# Patient Record
Sex: Female | Born: 1935 | Race: White | Hispanic: No | Smoking: Never smoker
Health system: Southern US, Community
[De-identification: ages and names within clinical notes are randomized; demographics above are authoritative.]

## PROBLEM LIST (undated history)

## (undated) DIAGNOSIS — G4700 Insomnia, unspecified: Secondary | ICD-10-CM

## (undated) DIAGNOSIS — K279 Peptic ulcer, site unspecified, unspecified as acute or chronic, without hemorrhage or perforation: Secondary | ICD-10-CM

## (undated) DIAGNOSIS — I052 Rheumatic mitral stenosis with insufficiency: Secondary | ICD-10-CM

## (undated) DIAGNOSIS — K449 Diaphragmatic hernia without obstruction or gangrene: Secondary | ICD-10-CM

## (undated) DIAGNOSIS — I10 Essential (primary) hypertension: Secondary | ICD-10-CM

## (undated) DIAGNOSIS — K222 Esophageal obstruction: Secondary | ICD-10-CM

## (undated) DIAGNOSIS — F419 Anxiety disorder, unspecified: Secondary | ICD-10-CM

## (undated) DIAGNOSIS — F329 Major depressive disorder, single episode, unspecified: Secondary | ICD-10-CM

## (undated) DIAGNOSIS — I4891 Unspecified atrial fibrillation: Secondary | ICD-10-CM

## (undated) DIAGNOSIS — C50919 Malignant neoplasm of unspecified site of unspecified female breast: Secondary | ICD-10-CM

## (undated) DIAGNOSIS — K311 Adult hypertrophic pyloric stenosis: Secondary | ICD-10-CM

## (undated) DIAGNOSIS — E785 Hyperlipidemia, unspecified: Secondary | ICD-10-CM

## (undated) DIAGNOSIS — I099 Rheumatic heart disease, unspecified: Secondary | ICD-10-CM

## (undated) DIAGNOSIS — J449 Chronic obstructive pulmonary disease, unspecified: Secondary | ICD-10-CM

## (undated) DIAGNOSIS — F32A Depression, unspecified: Secondary | ICD-10-CM

## (undated) DIAGNOSIS — K219 Gastro-esophageal reflux disease without esophagitis: Secondary | ICD-10-CM

## (undated) DIAGNOSIS — K648 Other hemorrhoids: Secondary | ICD-10-CM

## (undated) DIAGNOSIS — M1990 Unspecified osteoarthritis, unspecified site: Secondary | ICD-10-CM

## (undated) DIAGNOSIS — K5790 Diverticulosis of intestine, part unspecified, without perforation or abscess without bleeding: Secondary | ICD-10-CM

## (undated) HISTORY — DX: Chronic obstructive pulmonary disease, unspecified: J44.9

## (undated) HISTORY — DX: Anxiety disorder, unspecified: F41.9

## (undated) HISTORY — DX: Unspecified osteoarthritis, unspecified site: M19.90

## (undated) HISTORY — PX: ABDOMINAL HYSTERECTOMY: SHX81

## (undated) HISTORY — DX: Other hemorrhoids: K64.8

## (undated) HISTORY — DX: Depression, unspecified: F32.A

## (undated) HISTORY — PX: BREAST SURGERY: SHX581

## (undated) HISTORY — DX: Adult hypertrophic pyloric stenosis: K31.1

## (undated) HISTORY — DX: Diverticulosis of intestine, part unspecified, without perforation or abscess without bleeding: K57.90

## (undated) HISTORY — PX: HEMORRHOID SURGERY: SHX153

## (undated) HISTORY — DX: Essential (primary) hypertension: I10

## (undated) HISTORY — DX: Major depressive disorder, single episode, unspecified: F32.9

## (undated) HISTORY — DX: Esophageal obstruction: K22.2

## (undated) HISTORY — DX: Diaphragmatic hernia without obstruction or gangrene: K44.9

## (undated) HISTORY — PX: TOTAL KNEE ARTHROPLASTY: SHX125

## (undated) HISTORY — DX: Gastro-esophageal reflux disease without esophagitis: K21.9

## (undated) HISTORY — PX: JOINT REPLACEMENT: SHX530

## (undated) HISTORY — DX: Malignant neoplasm of unspecified site of unspecified female breast: C50.919

## (undated) HISTORY — DX: Rheumatic mitral stenosis with insufficiency: I05.2

## (undated) HISTORY — DX: Peptic ulcer, site unspecified, unspecified as acute or chronic, without hemorrhage or perforation: K27.9

## (undated) HISTORY — PX: TONSILLECTOMY: SUR1361

## (undated) HISTORY — DX: Rheumatic heart disease, unspecified: I09.9

## (undated) HISTORY — DX: Hyperlipidemia, unspecified: E78.5

## (undated) HISTORY — DX: Insomnia, unspecified: G47.00

## (undated) HISTORY — PX: CATARACT EXTRACTION: SUR2

## (undated) HISTORY — DX: Unspecified atrial fibrillation: I48.91

---

## 1993-04-18 DIAGNOSIS — Z9010 Acquired absence of unspecified breast and nipple: Secondary | ICD-10-CM | POA: Insufficient documentation

## 1993-04-18 HISTORY — PX: MASTECTOMY: SHX3

## 1994-10-06 DIAGNOSIS — I4949 Other premature depolarization: Secondary | ICD-10-CM | POA: Insufficient documentation

## 1998-01-26 ENCOUNTER — Other Ambulatory Visit: Admission: RE | Admit: 1998-01-26 | Discharge: 1998-01-26 | Payer: Self-pay | Admitting: *Deleted

## 1998-04-18 HISTORY — PX: BREAST RECONSTRUCTION: SHX9

## 1998-06-18 ENCOUNTER — Encounter: Payer: Self-pay | Admitting: Internal Medicine

## 1998-06-18 ENCOUNTER — Ambulatory Visit (HOSPITAL_COMMUNITY): Admission: RE | Admit: 1998-06-18 | Discharge: 1998-06-18 | Payer: Self-pay | Admitting: Internal Medicine

## 1998-08-03 ENCOUNTER — Ambulatory Visit (HOSPITAL_COMMUNITY): Admission: RE | Admit: 1998-08-03 | Discharge: 1998-08-03 | Payer: Self-pay | Admitting: Internal Medicine

## 1998-08-03 ENCOUNTER — Encounter: Payer: Self-pay | Admitting: Internal Medicine

## 1998-12-23 ENCOUNTER — Other Ambulatory Visit: Admission: RE | Admit: 1998-12-23 | Discharge: 1998-12-23 | Payer: Self-pay | Admitting: *Deleted

## 1999-04-08 ENCOUNTER — Encounter: Payer: Self-pay | Admitting: Emergency Medicine

## 1999-04-08 ENCOUNTER — Emergency Department (HOSPITAL_COMMUNITY): Admission: EM | Admit: 1999-04-08 | Discharge: 1999-04-08 | Payer: Self-pay | Admitting: Emergency Medicine

## 1999-04-14 ENCOUNTER — Encounter: Payer: Self-pay | Admitting: Emergency Medicine

## 1999-04-14 ENCOUNTER — Emergency Department (HOSPITAL_COMMUNITY): Admission: EM | Admit: 1999-04-14 | Discharge: 1999-04-14 | Payer: Self-pay | Admitting: Emergency Medicine

## 1999-12-28 ENCOUNTER — Encounter: Payer: Self-pay | Admitting: Oncology

## 1999-12-28 ENCOUNTER — Encounter: Admission: RE | Admit: 1999-12-28 | Discharge: 1999-12-28 | Payer: Self-pay | Admitting: Oncology

## 2000-08-08 ENCOUNTER — Encounter (INDEPENDENT_AMBULATORY_CARE_PROVIDER_SITE_OTHER): Payer: Self-pay

## 2000-08-08 ENCOUNTER — Other Ambulatory Visit: Admission: RE | Admit: 2000-08-08 | Discharge: 2000-08-08 | Payer: Self-pay | Admitting: Gastroenterology

## 2000-08-08 DIAGNOSIS — Z8719 Personal history of other diseases of the digestive system: Secondary | ICD-10-CM

## 2001-01-08 ENCOUNTER — Encounter: Admission: RE | Admit: 2001-01-08 | Discharge: 2001-01-08 | Payer: Self-pay | Admitting: Oncology

## 2001-01-08 ENCOUNTER — Encounter: Payer: Self-pay | Admitting: Oncology

## 2001-07-16 ENCOUNTER — Ambulatory Visit (HOSPITAL_COMMUNITY): Admission: RE | Admit: 2001-07-16 | Discharge: 2001-07-16 | Payer: Self-pay | Admitting: Internal Medicine

## 2001-07-16 ENCOUNTER — Encounter: Payer: Self-pay | Admitting: Internal Medicine

## 2002-01-23 ENCOUNTER — Encounter: Admission: RE | Admit: 2002-01-23 | Discharge: 2002-01-23 | Payer: Self-pay | Admitting: Internal Medicine

## 2002-01-23 ENCOUNTER — Encounter: Payer: Self-pay | Admitting: Internal Medicine

## 2003-01-28 ENCOUNTER — Encounter: Admission: RE | Admit: 2003-01-28 | Discharge: 2003-01-28 | Payer: Self-pay | Admitting: Internal Medicine

## 2003-01-28 ENCOUNTER — Encounter: Payer: Self-pay | Admitting: Internal Medicine

## 2003-04-27 ENCOUNTER — Emergency Department (HOSPITAL_COMMUNITY): Admission: AD | Admit: 2003-04-27 | Discharge: 2003-04-27 | Payer: Self-pay | Admitting: Family Medicine

## 2003-08-06 ENCOUNTER — Encounter: Admission: RE | Admit: 2003-08-06 | Discharge: 2003-08-06 | Payer: Self-pay | Admitting: Orthopedic Surgery

## 2003-11-14 ENCOUNTER — Encounter: Admission: RE | Admit: 2003-11-14 | Discharge: 2003-11-14 | Payer: Self-pay | Admitting: Orthopedic Surgery

## 2003-12-19 ENCOUNTER — Encounter: Admission: RE | Admit: 2003-12-19 | Discharge: 2003-12-19 | Payer: Self-pay | Admitting: Orthopedic Surgery

## 2004-02-02 ENCOUNTER — Encounter: Admission: RE | Admit: 2004-02-02 | Discharge: 2004-02-02 | Payer: Self-pay | Admitting: Family Medicine

## 2004-03-01 ENCOUNTER — Ambulatory Visit: Payer: Self-pay | Admitting: Family Medicine

## 2004-03-22 ENCOUNTER — Ambulatory Visit: Payer: Self-pay | Admitting: Family Medicine

## 2004-04-20 ENCOUNTER — Emergency Department (HOSPITAL_COMMUNITY): Admission: EM | Admit: 2004-04-20 | Discharge: 2004-04-20 | Payer: Self-pay | Admitting: Emergency Medicine

## 2004-05-17 ENCOUNTER — Ambulatory Visit: Payer: Self-pay | Admitting: Family Medicine

## 2004-08-16 ENCOUNTER — Ambulatory Visit: Payer: Self-pay | Admitting: Family Medicine

## 2004-09-06 ENCOUNTER — Ambulatory Visit: Payer: Self-pay | Admitting: Family Medicine

## 2004-09-10 ENCOUNTER — Ambulatory Visit: Payer: Self-pay | Admitting: Family Medicine

## 2004-10-06 ENCOUNTER — Ambulatory Visit: Payer: Self-pay | Admitting: Gastroenterology

## 2004-10-06 ENCOUNTER — Inpatient Hospital Stay (HOSPITAL_COMMUNITY): Admission: EM | Admit: 2004-10-06 | Discharge: 2004-10-08 | Payer: Self-pay | Admitting: Emergency Medicine

## 2004-10-06 ENCOUNTER — Ambulatory Visit: Payer: Self-pay | Admitting: Internal Medicine

## 2004-10-08 ENCOUNTER — Encounter (INDEPENDENT_AMBULATORY_CARE_PROVIDER_SITE_OTHER): Payer: Self-pay | Admitting: *Deleted

## 2004-10-14 ENCOUNTER — Ambulatory Visit: Payer: Self-pay | Admitting: Family Medicine

## 2004-10-15 ENCOUNTER — Ambulatory Visit (HOSPITAL_COMMUNITY): Admission: RE | Admit: 2004-10-15 | Discharge: 2004-10-15 | Payer: Self-pay | Admitting: Internal Medicine

## 2004-10-15 ENCOUNTER — Encounter (INDEPENDENT_AMBULATORY_CARE_PROVIDER_SITE_OTHER): Payer: Self-pay | Admitting: Specialist

## 2004-10-18 ENCOUNTER — Ambulatory Visit: Payer: Self-pay | Admitting: Family Medicine

## 2004-11-03 ENCOUNTER — Ambulatory Visit: Payer: Self-pay | Admitting: Family Medicine

## 2004-12-02 ENCOUNTER — Ambulatory Visit: Payer: Self-pay | Admitting: Family Medicine

## 2005-02-16 ENCOUNTER — Encounter: Admission: RE | Admit: 2005-02-16 | Discharge: 2005-02-16 | Payer: Self-pay | Admitting: Family Medicine

## 2005-03-15 ENCOUNTER — Ambulatory Visit: Payer: Self-pay | Admitting: Family Medicine

## 2005-04-05 ENCOUNTER — Ambulatory Visit: Payer: Self-pay | Admitting: Family Medicine

## 2005-05-11 ENCOUNTER — Ambulatory Visit: Payer: Self-pay | Admitting: Family Medicine

## 2005-05-16 ENCOUNTER — Encounter: Admission: RE | Admit: 2005-05-16 | Discharge: 2005-05-16 | Payer: Self-pay | Admitting: Family Medicine

## 2005-05-19 ENCOUNTER — Encounter: Admission: RE | Admit: 2005-05-19 | Discharge: 2005-05-19 | Payer: Self-pay | Admitting: Family Medicine

## 2005-05-24 ENCOUNTER — Encounter: Admission: RE | Admit: 2005-05-24 | Discharge: 2005-05-24 | Payer: Self-pay | Admitting: Family Medicine

## 2005-06-03 ENCOUNTER — Ambulatory Visit: Payer: Self-pay | Admitting: Family Medicine

## 2005-06-10 ENCOUNTER — Ambulatory Visit: Payer: Self-pay | Admitting: Family Medicine

## 2005-06-17 ENCOUNTER — Ambulatory Visit: Payer: Self-pay | Admitting: Family Medicine

## 2005-06-24 ENCOUNTER — Ambulatory Visit: Payer: Self-pay | Admitting: Family Medicine

## 2005-07-01 ENCOUNTER — Ambulatory Visit: Payer: Self-pay | Admitting: Family Medicine

## 2005-07-18 ENCOUNTER — Ambulatory Visit: Payer: Self-pay | Admitting: Family Medicine

## 2005-07-21 ENCOUNTER — Ambulatory Visit: Payer: Self-pay | Admitting: Family Medicine

## 2005-07-27 ENCOUNTER — Ambulatory Visit: Payer: Self-pay | Admitting: Family Medicine

## 2005-08-02 ENCOUNTER — Ambulatory Visit: Payer: Self-pay | Admitting: Internal Medicine

## 2005-08-20 ENCOUNTER — Ambulatory Visit: Payer: Self-pay | Admitting: Internal Medicine

## 2005-12-26 ENCOUNTER — Ambulatory Visit: Payer: Self-pay | Admitting: Family Medicine

## 2005-12-27 ENCOUNTER — Ambulatory Visit: Payer: Self-pay | Admitting: Family Medicine

## 2006-02-15 ENCOUNTER — Ambulatory Visit: Payer: Self-pay | Admitting: Family Medicine

## 2006-02-28 ENCOUNTER — Ambulatory Visit: Payer: Self-pay | Admitting: Family Medicine

## 2006-03-07 ENCOUNTER — Encounter: Admission: RE | Admit: 2006-03-07 | Discharge: 2006-03-07 | Payer: Self-pay | Admitting: Family Medicine

## 2006-03-31 ENCOUNTER — Ambulatory Visit: Payer: Self-pay | Admitting: Family Medicine

## 2006-03-31 LAB — CONVERTED CEMR LAB
ALT: 17 units/L (ref 0–40)
AST: 19 units/L (ref 0–37)
Bilirubin, Direct: 0.1 mg/dL (ref 0.0–0.3)
Cholesterol: 142 mg/dL (ref 0–200)
HDL: 46 mg/dL (ref >39.0)
Total Bilirubin: 0.8 mg/dL (ref 0.3–1.2)
Total Protein: 7 g/dL (ref 6.0–8.3)
Triglyceride fasting, serum: 86 mg/dL (ref 0–149)
VLDL: 17 mg/dL (ref 0–40)

## 2006-04-07 ENCOUNTER — Ambulatory Visit: Payer: Self-pay | Admitting: Family Medicine

## 2006-04-27 ENCOUNTER — Ambulatory Visit: Payer: Self-pay

## 2006-05-11 ENCOUNTER — Ambulatory Visit: Payer: Self-pay | Admitting: Family Medicine

## 2006-05-19 ENCOUNTER — Ambulatory Visit: Payer: Self-pay

## 2006-05-19 ENCOUNTER — Encounter: Payer: Self-pay | Admitting: Cardiology

## 2006-06-06 ENCOUNTER — Ambulatory Visit: Payer: Self-pay | Admitting: Internal Medicine

## 2006-06-12 ENCOUNTER — Ambulatory Visit: Payer: Self-pay | Admitting: Internal Medicine

## 2006-06-12 ENCOUNTER — Ambulatory Visit: Admission: RE | Admit: 2006-06-12 | Discharge: 2006-06-12 | Payer: Self-pay | Admitting: Internal Medicine

## 2006-06-12 LAB — CONVERTED CEMR LAB
BUN: 15 mg/dL (ref 6–23)
Basophils Absolute: 0.1 10*3/uL (ref 0.0–0.1)
Chloride: 105 meq/L (ref 96–112)
Eosinophils Absolute: 0.2 10*3/uL (ref 0.0–0.6)
GFR calc non Af Amer: 87 mL/min
HCT: 40.6 % (ref 36.0–46.0)
MCHC: 34.6 g/dL (ref 30.0–36.0)
MCV: 96.1 fL (ref 78.0–100.0)
Monocytes Relative: 7 % (ref 3.0–11.0)
Neutrophils Relative %: 63.4 % (ref 43.0–77.0)
Potassium: 3.6 meq/L (ref 3.5–5.1)
RBC: 4.23 M/uL (ref 3.87–5.11)
Sodium: 144 meq/L (ref 135–145)
aPTT: 27.9 s (ref 26.5–36.5)

## 2006-06-13 ENCOUNTER — Ambulatory Visit: Payer: Self-pay | Admitting: Cardiology

## 2006-06-15 ENCOUNTER — Inpatient Hospital Stay (HOSPITAL_BASED_OUTPATIENT_CLINIC_OR_DEPARTMENT_OTHER): Admission: RE | Admit: 2006-06-15 | Discharge: 2006-06-15 | Payer: Self-pay | Admitting: Internal Medicine

## 2006-06-15 ENCOUNTER — Ambulatory Visit: Payer: Self-pay | Admitting: Internal Medicine

## 2006-06-21 ENCOUNTER — Ambulatory Visit (HOSPITAL_COMMUNITY): Admission: RE | Admit: 2006-06-21 | Discharge: 2006-06-21 | Payer: Self-pay | Admitting: Internal Medicine

## 2006-06-22 ENCOUNTER — Ambulatory Visit (HOSPITAL_COMMUNITY): Admission: RE | Admit: 2006-06-22 | Discharge: 2006-06-22 | Payer: Self-pay | Admitting: Internal Medicine

## 2006-07-04 ENCOUNTER — Ambulatory Visit: Payer: Self-pay | Admitting: Internal Medicine

## 2006-07-27 ENCOUNTER — Ambulatory Visit: Payer: Self-pay | Admitting: Internal Medicine

## 2006-07-27 ENCOUNTER — Ambulatory Visit (HOSPITAL_COMMUNITY): Admission: RE | Admit: 2006-07-27 | Discharge: 2006-07-27 | Payer: Self-pay | Admitting: Internal Medicine

## 2006-08-03 ENCOUNTER — Ambulatory Visit: Payer: Self-pay | Admitting: Family Medicine

## 2006-08-03 LAB — CONVERTED CEMR LAB
ALT: 13 units/L (ref 0–40)
AST: 19 units/L (ref 0–37)
Albumin: 3.9 g/dL (ref 3.5–5.2)
Alkaline Phosphatase: 62 units/L (ref 39–117)
BUN: 14 mg/dL (ref 6–23)
Chloride: 107 meq/L (ref 96–112)
Creatinine, Ser: 0.7 mg/dL (ref 0.4–1.2)
HDL: 39.4 mg/dL (ref >39.0)
Total Bilirubin: 0.8 mg/dL (ref 0.3–1.2)
VLDL: 31 mg/dL (ref 0–40)

## 2006-08-22 ENCOUNTER — Encounter: Admission: RE | Admit: 2006-08-22 | Discharge: 2006-08-22 | Payer: Self-pay | Admitting: Family Medicine

## 2006-08-22 ENCOUNTER — Ambulatory Visit: Payer: Self-pay | Admitting: Family Medicine

## 2006-08-29 ENCOUNTER — Ambulatory Visit: Payer: Self-pay | Admitting: Internal Medicine

## 2006-08-29 ENCOUNTER — Ambulatory Visit: Payer: Self-pay

## 2006-08-31 ENCOUNTER — Encounter: Admission: RE | Admit: 2006-08-31 | Discharge: 2006-08-31 | Payer: Self-pay | Admitting: Family Medicine

## 2006-09-04 DIAGNOSIS — I10 Essential (primary) hypertension: Secondary | ICD-10-CM | POA: Insufficient documentation

## 2006-09-04 DIAGNOSIS — Z8679 Personal history of other diseases of the circulatory system: Secondary | ICD-10-CM

## 2006-09-04 DIAGNOSIS — Z9089 Acquired absence of other organs: Secondary | ICD-10-CM

## 2006-09-04 DIAGNOSIS — H409 Unspecified glaucoma: Secondary | ICD-10-CM | POA: Insufficient documentation

## 2006-09-04 DIAGNOSIS — Z9079 Acquired absence of other genital organ(s): Secondary | ICD-10-CM | POA: Insufficient documentation

## 2006-09-04 DIAGNOSIS — M810 Age-related osteoporosis without current pathological fracture: Secondary | ICD-10-CM

## 2006-10-24 ENCOUNTER — Encounter: Payer: Self-pay | Admitting: Family Medicine

## 2006-11-22 ENCOUNTER — Encounter: Payer: Self-pay | Admitting: *Deleted

## 2006-11-30 ENCOUNTER — Ambulatory Visit (HOSPITAL_COMMUNITY): Admission: RE | Admit: 2006-11-30 | Discharge: 2006-11-30 | Payer: Self-pay | Admitting: Interventional Radiology

## 2006-12-19 ENCOUNTER — Ambulatory Visit: Payer: Self-pay | Admitting: Family Medicine

## 2006-12-19 DIAGNOSIS — F411 Generalized anxiety disorder: Secondary | ICD-10-CM | POA: Insufficient documentation

## 2006-12-19 DIAGNOSIS — J4489 Other specified chronic obstructive pulmonary disease: Secondary | ICD-10-CM | POA: Insufficient documentation

## 2006-12-19 DIAGNOSIS — F329 Major depressive disorder, single episode, unspecified: Secondary | ICD-10-CM | POA: Insufficient documentation

## 2006-12-19 DIAGNOSIS — E785 Hyperlipidemia, unspecified: Secondary | ICD-10-CM

## 2006-12-19 DIAGNOSIS — F419 Anxiety disorder, unspecified: Secondary | ICD-10-CM

## 2006-12-19 DIAGNOSIS — J449 Chronic obstructive pulmonary disease, unspecified: Secondary | ICD-10-CM

## 2006-12-20 ENCOUNTER — Ambulatory Visit: Payer: Self-pay | Admitting: Family Medicine

## 2006-12-21 ENCOUNTER — Encounter: Payer: Self-pay | Admitting: Family Medicine

## 2006-12-25 LAB — CONVERTED CEMR LAB
ALT: 16 units/L (ref 0–35)
AST: 14 units/L (ref 0–37)
Albumin: 4.1 g/dL (ref 3.5–5.2)
Calcium: 9.6 mg/dL (ref 8.4–10.5)
Chloride: 107 meq/L (ref 96–112)
GFR calc non Af Amer: 65 mL/min
LDL Cholesterol: 85 mg/dL (ref 0–99)
Sodium: 145 meq/L (ref 135–145)
VLDL: 23 mg/dL (ref 0–40)

## 2007-01-02 ENCOUNTER — Inpatient Hospital Stay (HOSPITAL_COMMUNITY): Admission: RE | Admit: 2007-01-02 | Discharge: 2007-01-06 | Payer: Self-pay | Admitting: Orthopedic Surgery

## 2007-02-12 ENCOUNTER — Telehealth: Payer: Self-pay | Admitting: Internal Medicine

## 2007-04-02 ENCOUNTER — Ambulatory Visit: Payer: Self-pay | Admitting: Family Medicine

## 2007-04-03 ENCOUNTER — Encounter: Admission: RE | Admit: 2007-04-03 | Discharge: 2007-04-03 | Payer: Self-pay | Admitting: Family Medicine

## 2007-04-04 ENCOUNTER — Telehealth (INDEPENDENT_AMBULATORY_CARE_PROVIDER_SITE_OTHER): Payer: Self-pay | Admitting: *Deleted

## 2007-04-09 ENCOUNTER — Encounter (INDEPENDENT_AMBULATORY_CARE_PROVIDER_SITE_OTHER): Payer: Self-pay | Admitting: *Deleted

## 2007-04-17 ENCOUNTER — Ambulatory Visit: Payer: Self-pay | Admitting: Internal Medicine

## 2007-04-17 ENCOUNTER — Telehealth (INDEPENDENT_AMBULATORY_CARE_PROVIDER_SITE_OTHER): Payer: Self-pay | Admitting: *Deleted

## 2007-04-17 LAB — CONVERTED CEMR LAB
Eosinophils Absolute: 0.2 10*3/uL (ref 0.0–0.6)
Eosinophils Relative: 3.1 % (ref 0.0–5.0)
Lymphocytes Relative: 25.2 % (ref 12.0–46.0)
MCV: 93.9 fL (ref 78.0–100.0)
Monocytes Absolute: 0.3 10*3/uL (ref 0.2–0.7)
Neutro Abs: 3.9 10*3/uL (ref 1.4–7.7)
Platelets: 275 10*3/uL (ref 150–400)
WBC: 5.9 10*3/uL (ref 4.5–10.5)

## 2007-04-20 ENCOUNTER — Ambulatory Visit: Payer: Self-pay | Admitting: Internal Medicine

## 2007-04-27 ENCOUNTER — Telehealth (INDEPENDENT_AMBULATORY_CARE_PROVIDER_SITE_OTHER): Payer: Self-pay | Admitting: *Deleted

## 2007-05-02 ENCOUNTER — Ambulatory Visit: Payer: Self-pay | Admitting: Internal Medicine

## 2007-05-29 ENCOUNTER — Encounter: Payer: Self-pay | Admitting: Family Medicine

## 2007-05-29 ENCOUNTER — Ambulatory Visit: Payer: Self-pay | Admitting: Internal Medicine

## 2007-05-29 ENCOUNTER — Encounter: Payer: Self-pay | Admitting: Internal Medicine

## 2007-06-04 ENCOUNTER — Telehealth (INDEPENDENT_AMBULATORY_CARE_PROVIDER_SITE_OTHER): Payer: Self-pay | Admitting: *Deleted

## 2007-06-06 ENCOUNTER — Encounter: Payer: Self-pay | Admitting: Family Medicine

## 2007-06-07 DIAGNOSIS — K279 Peptic ulcer, site unspecified, unspecified as acute or chronic, without hemorrhage or perforation: Secondary | ICD-10-CM | POA: Insufficient documentation

## 2007-06-08 ENCOUNTER — Encounter: Payer: Self-pay | Admitting: Family Medicine

## 2007-06-20 ENCOUNTER — Ambulatory Visit: Payer: Self-pay | Admitting: Family Medicine

## 2007-06-21 LAB — CONVERTED CEMR LAB
ALT: 11 units/L (ref 0–35)
AST: 14 units/L (ref 0–37)
Albumin: 4 g/dL (ref 3.5–5.2)
Alkaline Phosphatase: 60 units/L (ref 39–117)
Bilirubin, Direct: 0.1 mg/dL (ref 0.0–0.3)
HDL: 51.2 mg/dL (ref >39.0)
VLDL: 26 mg/dL (ref 0–40)

## 2007-06-28 ENCOUNTER — Ambulatory Visit: Payer: Self-pay | Admitting: Family Medicine

## 2007-06-28 DIAGNOSIS — R42 Dizziness and giddiness: Secondary | ICD-10-CM | POA: Insufficient documentation

## 2007-07-02 ENCOUNTER — Encounter (INDEPENDENT_AMBULATORY_CARE_PROVIDER_SITE_OTHER): Payer: Self-pay | Admitting: *Deleted

## 2007-07-02 LAB — CONVERTED CEMR LAB
Albumin: 4.4 g/dL (ref 3.5–5.2)
Bilirubin, Direct: 0.2 mg/dL (ref 0.0–0.3)
Eosinophils Absolute: 0.1 10*3/uL (ref 0.0–0.6)
Folate: 6.6 ng/mL
GFR calc non Af Amer: 75 mL/min
Glucose, Bld: 77 mg/dL (ref 70–99)
Lymphocytes Relative: 30 % (ref 12.0–46.0)
MCHC: 33.2 g/dL (ref 30.0–36.0)
MCV: 97.8 fL (ref 78.0–100.0)
Monocytes Relative: 3.7 % (ref 3.0–11.0)
Neutro Abs: 3.8 10*3/uL (ref 1.4–7.7)
Phosphorus: 3.6 mg/dL (ref 2.3–4.6)
Platelets: 258 10*3/uL (ref 150–400)
Potassium: 3.9 meq/L (ref 3.5–5.1)
Sodium: 140 meq/L (ref 135–145)
TSH: 1 microintl units/mL (ref 0.35–5.50)
Total Bilirubin: 0.8 mg/dL (ref 0.3–1.2)
Vitamin B-12: 891 pg/mL (ref 211–911)

## 2007-07-06 ENCOUNTER — Encounter: Payer: Self-pay | Admitting: Family Medicine

## 2007-07-06 ENCOUNTER — Ambulatory Visit: Payer: Self-pay | Admitting: Family Medicine

## 2007-07-10 ENCOUNTER — Telehealth (INDEPENDENT_AMBULATORY_CARE_PROVIDER_SITE_OTHER): Payer: Self-pay | Admitting: *Deleted

## 2007-07-20 ENCOUNTER — Telehealth (INDEPENDENT_AMBULATORY_CARE_PROVIDER_SITE_OTHER): Payer: Self-pay | Admitting: *Deleted

## 2007-08-06 ENCOUNTER — Telehealth (INDEPENDENT_AMBULATORY_CARE_PROVIDER_SITE_OTHER): Payer: Self-pay | Admitting: *Deleted

## 2007-08-07 ENCOUNTER — Ambulatory Visit: Payer: Self-pay | Admitting: Family Medicine

## 2007-08-07 ENCOUNTER — Telehealth (INDEPENDENT_AMBULATORY_CARE_PROVIDER_SITE_OTHER): Payer: Self-pay | Admitting: *Deleted

## 2007-08-07 DIAGNOSIS — R05 Cough: Secondary | ICD-10-CM

## 2007-08-07 DIAGNOSIS — J209 Acute bronchitis, unspecified: Secondary | ICD-10-CM

## 2007-08-09 ENCOUNTER — Ambulatory Visit: Payer: Self-pay | Admitting: Family Medicine

## 2007-10-23 ENCOUNTER — Inpatient Hospital Stay (HOSPITAL_COMMUNITY): Admission: RE | Admit: 2007-10-23 | Discharge: 2007-10-26 | Payer: Self-pay | Admitting: Orthopedic Surgery

## 2007-11-08 ENCOUNTER — Telehealth (INDEPENDENT_AMBULATORY_CARE_PROVIDER_SITE_OTHER): Payer: Self-pay | Admitting: *Deleted

## 2007-12-12 ENCOUNTER — Ambulatory Visit: Payer: Self-pay | Admitting: Family Medicine

## 2007-12-14 ENCOUNTER — Ambulatory Visit: Payer: Self-pay | Admitting: Family Medicine

## 2007-12-26 LAB — CONVERTED CEMR LAB
ALT: 10 units/L (ref 0–35)
AST: 19 units/L (ref 0–37)
Bilirubin, Direct: 0.1 mg/dL (ref 0.0–0.3)
CO2: 26 meq/L (ref 19–32)
Calcium: 9.2 mg/dL (ref 8.4–10.5)
Chloride: 112 meq/L (ref 96–112)
Glucose, Bld: 90 mg/dL (ref 70–99)
HDL: 43.3 mg/dL (ref >39.0)
Sodium: 144 meq/L (ref 135–145)
Total Bilirubin: 0.6 mg/dL (ref 0.3–1.2)
Total Protein: 7 g/dL (ref 6.0–8.3)
Triglycerides: 88 mg/dL (ref 0–149)

## 2007-12-27 ENCOUNTER — Encounter (INDEPENDENT_AMBULATORY_CARE_PROVIDER_SITE_OTHER): Payer: Self-pay | Admitting: *Deleted

## 2008-02-06 ENCOUNTER — Ambulatory Visit: Payer: Self-pay | Admitting: Family Medicine

## 2008-02-06 ENCOUNTER — Encounter (INDEPENDENT_AMBULATORY_CARE_PROVIDER_SITE_OTHER): Payer: Self-pay | Admitting: *Deleted

## 2008-02-06 DIAGNOSIS — R5383 Other fatigue: Secondary | ICD-10-CM

## 2008-02-06 DIAGNOSIS — H9190 Unspecified hearing loss, unspecified ear: Secondary | ICD-10-CM | POA: Insufficient documentation

## 2008-02-06 DIAGNOSIS — R5381 Other malaise: Secondary | ICD-10-CM

## 2008-02-12 LAB — CONVERTED CEMR LAB
Basophils Absolute: 0 10*3/uL (ref 0.0–0.1)
Basophils Relative: 0.9 % (ref 0.0–3.0)
Eosinophils Absolute: 0.1 10*3/uL (ref 0.0–0.7)
Eosinophils Relative: 2.4 % (ref 0.0–5.0)
Ferritin: 19.3 ng/mL (ref 10.0–291.0)
Folate: 7.5 ng/mL
HCT: 42.7 % (ref 36.0–46.0)
Hemoglobin: 14.5 g/dL (ref 12.0–15.0)
Iron: 89 ug/dL (ref 42–145)
Lymphocytes Relative: 32 % (ref 12.0–46.0)
MCHC: 34.1 g/dL (ref 30.0–36.0)
MCV: 91.5 fL (ref 78.0–100.0)
Monocytes Absolute: 0.3 10*3/uL (ref 0.1–1.0)
Monocytes Relative: 5.9 % (ref 3.0–12.0)
Neutro Abs: 2.9 10*3/uL (ref 1.4–7.7)
Neutrophils Relative %: 58.8 % (ref 43.0–77.0)
Platelets: 251 10*3/uL (ref 150–400)
RBC: 4.66 M/uL (ref 3.87–5.11)
RDW: 13.1 % (ref 11.5–14.6)
Saturation Ratios: 19.3 % — ABNORMAL LOW (ref 20.0–50.0)
TSH: 0.87 microintl units/mL (ref 0.35–5.50)
Transferrin: 330.1 mg/dL (ref >=212.0)
Vitamin B-12: 1088 pg/mL — ABNORMAL HIGH (ref 211–911)
WBC: 4.8 10*3/uL (ref 4.5–10.5)

## 2008-02-13 ENCOUNTER — Encounter (INDEPENDENT_AMBULATORY_CARE_PROVIDER_SITE_OTHER): Payer: Self-pay | Admitting: *Deleted

## 2008-02-14 ENCOUNTER — Encounter: Payer: Self-pay | Admitting: Family Medicine

## 2008-04-03 ENCOUNTER — Encounter: Admission: RE | Admit: 2008-04-03 | Discharge: 2008-04-03 | Payer: Self-pay | Admitting: Family Medicine

## 2008-04-07 ENCOUNTER — Encounter (INDEPENDENT_AMBULATORY_CARE_PROVIDER_SITE_OTHER): Payer: Self-pay | Admitting: *Deleted

## 2008-04-15 ENCOUNTER — Telehealth (INDEPENDENT_AMBULATORY_CARE_PROVIDER_SITE_OTHER): Payer: Self-pay | Admitting: *Deleted

## 2008-05-09 ENCOUNTER — Ambulatory Visit: Payer: Self-pay | Admitting: Family Medicine

## 2008-05-09 ENCOUNTER — Telehealth (INDEPENDENT_AMBULATORY_CARE_PROVIDER_SITE_OTHER): Payer: Self-pay | Admitting: *Deleted

## 2008-05-13 ENCOUNTER — Emergency Department (HOSPITAL_COMMUNITY): Admission: EM | Admit: 2008-05-13 | Discharge: 2008-05-13 | Payer: Self-pay | Admitting: Emergency Medicine

## 2008-05-20 ENCOUNTER — Telehealth (INDEPENDENT_AMBULATORY_CARE_PROVIDER_SITE_OTHER): Payer: Self-pay | Admitting: *Deleted

## 2008-06-09 ENCOUNTER — Ambulatory Visit: Payer: Self-pay | Admitting: Family Medicine

## 2008-06-09 DIAGNOSIS — Z780 Asymptomatic menopausal state: Secondary | ICD-10-CM | POA: Insufficient documentation

## 2008-06-09 DIAGNOSIS — J019 Acute sinusitis, unspecified: Secondary | ICD-10-CM

## 2008-07-07 ENCOUNTER — Telehealth: Payer: Self-pay | Admitting: Family Medicine

## 2008-08-11 ENCOUNTER — Telehealth (INDEPENDENT_AMBULATORY_CARE_PROVIDER_SITE_OTHER): Payer: Self-pay | Admitting: *Deleted

## 2008-08-25 ENCOUNTER — Telehealth (INDEPENDENT_AMBULATORY_CARE_PROVIDER_SITE_OTHER): Payer: Self-pay | Admitting: *Deleted

## 2008-10-07 ENCOUNTER — Ambulatory Visit: Payer: Self-pay | Admitting: Family Medicine

## 2008-10-29 ENCOUNTER — Encounter (INDEPENDENT_AMBULATORY_CARE_PROVIDER_SITE_OTHER): Payer: Self-pay | Admitting: *Deleted

## 2008-10-29 LAB — CONVERTED CEMR LAB
Basophils Absolute: 0 10*3/uL (ref 0.0–0.1)
HCT: 39.7 % (ref 36.0–46.0)
Hemoglobin: 13.6 g/dL (ref 12.0–15.0)
Lymphs Abs: 1.4 10*3/uL (ref 0.7–4.0)
MCHC: 34.2 g/dL (ref 30.0–36.0)
MCV: 97.5 fL (ref 78.0–100.0)
Monocytes Absolute: 0.1 10*3/uL (ref 0.1–1.0)
Monocytes Relative: 2 % — ABNORMAL LOW (ref 3.0–12.0)
Neutro Abs: 4.8 10*3/uL (ref 1.4–7.7)
Platelets: 225 10*3/uL (ref 150.0–400.0)
RDW: 12.2 % (ref 11.5–14.6)
Sed Rate: 11 mm/hr (ref 0–22)

## 2008-12-11 ENCOUNTER — Ambulatory Visit: Payer: Self-pay | Admitting: Family Medicine

## 2008-12-11 DIAGNOSIS — M546 Pain in thoracic spine: Secondary | ICD-10-CM | POA: Insufficient documentation

## 2008-12-12 ENCOUNTER — Ambulatory Visit: Payer: Self-pay | Admitting: Family Medicine

## 2008-12-12 LAB — CONVERTED CEMR LAB
Bilirubin Urine: NEGATIVE
Ketones, urine, test strip: NEGATIVE
Nitrite: NEGATIVE
Protein, U semiquant: NEGATIVE
Urobilinogen, UA: 0.2

## 2008-12-23 ENCOUNTER — Encounter (INDEPENDENT_AMBULATORY_CARE_PROVIDER_SITE_OTHER): Payer: Self-pay | Admitting: *Deleted

## 2008-12-23 LAB — CONVERTED CEMR LAB
ALT: 12 units/L (ref 0–35)
AST: 21 units/L (ref 0–37)
Alkaline Phosphatase: 76 units/L (ref 39–117)
Basophils Absolute: 0 10*3/uL (ref 0.0–0.1)
Bilirubin, Direct: 0 mg/dL (ref 0.0–0.3)
CO2: 27 meq/L (ref 19–32)
Chloride: 110 meq/L (ref 96–112)
LDL Cholesterol: 90 mg/dL (ref 0–99)
Lymphocytes Relative: 26.1 % (ref 12.0–46.0)
Monocytes Relative: 5.1 % (ref 3.0–12.0)
Neutro Abs: 2.7 10*3/uL (ref 1.4–7.7)
Neutrophils Relative %: 65.5 % (ref 43.0–77.0)
Platelets: 221 10*3/uL (ref 150.0–400.0)
Potassium: 3.8 meq/L (ref 3.5–5.1)
RBC: 4.2 M/uL (ref 3.87–5.11)
RDW: 11.9 % (ref 11.5–14.6)
Sodium: 143 meq/L (ref 135–145)
Total Bilirubin: 0.6 mg/dL (ref 0.3–1.2)
Total CHOL/HDL Ratio: 3
Total Protein: 6.9 g/dL (ref 6.0–8.3)
WBC: 4 10*3/uL — ABNORMAL LOW (ref 4.5–10.5)

## 2008-12-24 ENCOUNTER — Ambulatory Visit: Payer: Self-pay | Admitting: Family Medicine

## 2008-12-24 ENCOUNTER — Encounter (INDEPENDENT_AMBULATORY_CARE_PROVIDER_SITE_OTHER): Payer: Self-pay | Admitting: *Deleted

## 2008-12-24 LAB — CONVERTED CEMR LAB
OCCULT 1: NEGATIVE
OCCULT 3: NEGATIVE

## 2009-01-07 ENCOUNTER — Encounter: Payer: Self-pay | Admitting: Family Medicine

## 2009-01-15 ENCOUNTER — Ambulatory Visit: Payer: Self-pay | Admitting: Family Medicine

## 2009-01-15 DIAGNOSIS — N949 Unspecified condition associated with female genital organs and menstrual cycle: Secondary | ICD-10-CM | POA: Insufficient documentation

## 2009-01-15 DIAGNOSIS — R300 Dysuria: Secondary | ICD-10-CM

## 2009-01-15 LAB — CONVERTED CEMR LAB
Glucose, Urine, Semiquant: NEGATIVE
Nitrite: NEGATIVE
Specific Gravity, Urine: 1.015
WBC Urine, dipstick: NEGATIVE
pH: 6

## 2009-01-17 ENCOUNTER — Encounter: Payer: Self-pay | Admitting: Family Medicine

## 2009-01-19 ENCOUNTER — Encounter: Payer: Self-pay | Admitting: Family Medicine

## 2009-02-05 ENCOUNTER — Telehealth: Payer: Self-pay | Admitting: Family Medicine

## 2009-02-10 ENCOUNTER — Encounter: Payer: Self-pay | Admitting: Family Medicine

## 2009-03-16 ENCOUNTER — Telehealth (INDEPENDENT_AMBULATORY_CARE_PROVIDER_SITE_OTHER): Payer: Self-pay | Admitting: *Deleted

## 2009-03-19 ENCOUNTER — Ambulatory Visit: Payer: Self-pay | Admitting: Family Medicine

## 2009-03-19 DIAGNOSIS — R51 Headache: Secondary | ICD-10-CM

## 2009-03-19 DIAGNOSIS — R519 Headache, unspecified: Secondary | ICD-10-CM | POA: Insufficient documentation

## 2009-03-24 ENCOUNTER — Ambulatory Visit: Payer: Self-pay | Admitting: Cardiovascular Disease

## 2009-03-25 ENCOUNTER — Telehealth (INDEPENDENT_AMBULATORY_CARE_PROVIDER_SITE_OTHER): Payer: Self-pay | Admitting: *Deleted

## 2009-04-24 ENCOUNTER — Encounter: Admission: RE | Admit: 2009-04-24 | Discharge: 2009-04-24 | Payer: Self-pay | Admitting: Family Medicine

## 2009-07-29 ENCOUNTER — Ambulatory Visit: Payer: Self-pay | Admitting: Family Medicine

## 2009-07-30 ENCOUNTER — Ambulatory Visit: Payer: Self-pay | Admitting: Family Medicine

## 2009-07-31 ENCOUNTER — Encounter: Payer: Self-pay | Admitting: Family Medicine

## 2009-09-25 ENCOUNTER — Ambulatory Visit: Payer: Self-pay | Admitting: Family Medicine

## 2009-09-25 DIAGNOSIS — R0602 Shortness of breath: Secondary | ICD-10-CM

## 2009-09-25 DIAGNOSIS — R079 Chest pain, unspecified: Secondary | ICD-10-CM | POA: Insufficient documentation

## 2009-09-25 LAB — CONVERTED CEMR LAB
Blood in Urine, dipstick: NEGATIVE
Nitrite: NEGATIVE
Specific Gravity, Urine: 1.025
Urobilinogen, UA: NEGATIVE
WBC Urine, dipstick: NEGATIVE

## 2009-09-28 LAB — CONVERTED CEMR LAB
ALT: 10 units/L (ref 0–35)
Albumin: 4.4 g/dL (ref 3.5–5.2)
BUN: 19 mg/dL (ref 6–23)
Basophils Absolute: 0 10*3/uL (ref 0.0–0.1)
Basophils Relative: 0 % (ref 0–1)
CO2: 22 meq/L (ref 19–32)
Calcium: 8.7 mg/dL (ref 8.4–10.5)
Chloride: 107 meq/L (ref 96–112)
Creatinine, Ser: 0.91 mg/dL (ref 0.40–1.20)
Eosinophils Absolute: 0.1 10*3/uL (ref 0.0–0.7)
Eosinophils Relative: 2 % (ref 0–5)
Folate: >20 ng/mL
Glucose, Bld: 93 mg/dL (ref 70–99)
Hemoglobin: 13.2 g/dL (ref 12.0–15.0)
Indirect Bilirubin: 0.3 mg/dL (ref 0.0–0.9)
MCHC: 32 g/dL (ref 30.0–36.0)
MCV: 99 fL (ref 78.0–100.0)
Monocytes Absolute: 0.3 10*3/uL (ref 0.1–1.0)
Monocytes Relative: 4 % (ref 3–12)
Neutro Abs: 3.4 10*3/uL (ref 1.7–7.7)
RBC: 4.16 M/uL (ref 3.87–5.11)
RDW: 13.4 % (ref 11.5–15.5)
Sed Rate: 6 mm/hr (ref 0–22)
TSH: 1.532 microintl units/mL (ref 0.350–4.500)
Total Protein: 7.4 g/dL (ref 6.0–8.3)
Vitamin B-12: 381 pg/mL (ref 211–911)

## 2009-10-05 ENCOUNTER — Ambulatory Visit: Payer: Self-pay | Admitting: Family Medicine

## 2009-10-05 ENCOUNTER — Telehealth (INDEPENDENT_AMBULATORY_CARE_PROVIDER_SITE_OTHER): Payer: Self-pay | Admitting: *Deleted

## 2009-10-05 ENCOUNTER — Ambulatory Visit: Payer: Self-pay | Admitting: Internal Medicine

## 2009-10-09 ENCOUNTER — Ambulatory Visit: Payer: Self-pay | Admitting: Family Medicine

## 2009-10-09 DIAGNOSIS — M25512 Pain in left shoulder: Secondary | ICD-10-CM

## 2009-10-15 ENCOUNTER — Ambulatory Visit: Payer: Self-pay

## 2009-10-15 ENCOUNTER — Ambulatory Visit: Payer: Self-pay | Admitting: Cardiology

## 2009-10-15 ENCOUNTER — Ambulatory Visit (HOSPITAL_COMMUNITY): Admission: RE | Admit: 2009-10-15 | Discharge: 2009-10-15 | Payer: Self-pay | Admitting: Family Medicine

## 2009-10-15 ENCOUNTER — Encounter: Payer: Self-pay | Admitting: Family Medicine

## 2009-11-04 ENCOUNTER — Telehealth (INDEPENDENT_AMBULATORY_CARE_PROVIDER_SITE_OTHER): Payer: Self-pay | Admitting: *Deleted

## 2009-11-10 ENCOUNTER — Ambulatory Visit: Payer: Self-pay | Admitting: Family Medicine

## 2009-11-17 ENCOUNTER — Ambulatory Visit: Payer: Self-pay | Admitting: Family Medicine

## 2009-11-17 LAB — CONVERTED CEMR LAB
Blood in Urine, dipstick: NEGATIVE
Glucose, Urine, Semiquant: NEGATIVE
Protein, U semiquant: NEGATIVE
Urobilinogen, UA: 0.2
WBC Urine, dipstick: NEGATIVE
pH: 5

## 2009-11-18 LAB — CONVERTED CEMR LAB
ALT: 16 units/L (ref 0–35)
Chloride: 111 meq/L (ref 96–112)
GFR calc non Af Amer: 59.21 mL/min (ref >60)
Glucose, Bld: 93 mg/dL (ref 70–99)
HDL: 45.1 mg/dL (ref >39.00)
LDL Cholesterol: 75 mg/dL (ref 0–99)
Potassium: 4.3 meq/L (ref 3.5–5.1)
Sodium: 145 meq/L (ref 135–145)
Total Bilirubin: 0.7 mg/dL (ref 0.3–1.2)
Total Protein: 7 g/dL (ref 6.0–8.3)
VLDL: 19 mg/dL (ref 0.0–40.0)
Vit D, 25-Hydroxy: 36 ng/mL (ref 30–89)

## 2009-11-25 ENCOUNTER — Telehealth: Payer: Self-pay | Admitting: Family Medicine

## 2010-01-06 ENCOUNTER — Encounter: Payer: Self-pay | Admitting: Family Medicine

## 2010-01-28 ENCOUNTER — Telehealth: Payer: Self-pay | Admitting: Internal Medicine

## 2010-02-01 ENCOUNTER — Telehealth: Payer: Self-pay | Admitting: Internal Medicine

## 2010-02-01 ENCOUNTER — Ambulatory Visit: Payer: Self-pay | Admitting: Family Medicine

## 2010-02-01 ENCOUNTER — Telehealth (INDEPENDENT_AMBULATORY_CARE_PROVIDER_SITE_OTHER): Payer: Self-pay | Admitting: *Deleted

## 2010-02-01 DIAGNOSIS — M25559 Pain in unspecified hip: Secondary | ICD-10-CM

## 2010-02-01 DIAGNOSIS — K921 Melena: Secondary | ICD-10-CM

## 2010-02-01 DIAGNOSIS — R109 Unspecified abdominal pain: Secondary | ICD-10-CM

## 2010-02-01 DIAGNOSIS — R197 Diarrhea, unspecified: Secondary | ICD-10-CM

## 2010-02-01 LAB — CONVERTED CEMR LAB
Blood in Urine, dipstick: NEGATIVE
Specific Gravity, Urine: 1.025
pH: 5

## 2010-02-02 DIAGNOSIS — I099 Rheumatic heart disease, unspecified: Secondary | ICD-10-CM | POA: Insufficient documentation

## 2010-02-02 DIAGNOSIS — K219 Gastro-esophageal reflux disease without esophagitis: Secondary | ICD-10-CM

## 2010-02-02 DIAGNOSIS — K5730 Diverticulosis of large intestine without perforation or abscess without bleeding: Secondary | ICD-10-CM | POA: Insufficient documentation

## 2010-02-02 DIAGNOSIS — M1990 Unspecified osteoarthritis, unspecified site: Secondary | ICD-10-CM | POA: Insufficient documentation

## 2010-02-02 DIAGNOSIS — K269 Duodenal ulcer, unspecified as acute or chronic, without hemorrhage or perforation: Secondary | ICD-10-CM | POA: Insufficient documentation

## 2010-02-02 DIAGNOSIS — K222 Esophageal obstruction: Secondary | ICD-10-CM

## 2010-02-02 DIAGNOSIS — K5289 Other specified noninfective gastroenteritis and colitis: Secondary | ICD-10-CM

## 2010-02-02 DIAGNOSIS — K449 Diaphragmatic hernia without obstruction or gangrene: Secondary | ICD-10-CM | POA: Insufficient documentation

## 2010-02-02 LAB — CONVERTED CEMR LAB
ALT: 14 units/L (ref 0–35)
AST: 23 units/L (ref 0–37)
Basophils Relative: 1 % (ref 0.0–3.0)
Eosinophils Relative: 7.9 % — ABNORMAL HIGH (ref 0.0–5.0)
GFR calc non Af Amer: 71.84 mL/min (ref >60)
HCT: 38.2 % (ref 36.0–46.0)
Hemoglobin: 13 g/dL (ref 12.0–15.0)
Lymphs Abs: 1 10*3/uL (ref 0.7–4.0)
Monocytes Relative: 5.6 % (ref 3.0–12.0)
Neutro Abs: 3.1 10*3/uL (ref 1.4–7.7)
Platelets: 289 10*3/uL (ref 150.0–400.0)
Potassium: 3.5 meq/L (ref 3.5–5.1)
RBC: 4.08 M/uL (ref 3.87–5.11)
Sodium: 140 meq/L (ref 135–145)
TSH: 2.01 microintl units/mL (ref 0.35–5.50)
Total Bilirubin: 0.7 mg/dL (ref 0.3–1.2)
WBC: 4.8 10*3/uL (ref 4.5–10.5)

## 2010-02-03 ENCOUNTER — Ambulatory Visit: Payer: Self-pay | Admitting: Internal Medicine

## 2010-02-03 ENCOUNTER — Ambulatory Visit: Payer: Self-pay | Admitting: Family Medicine

## 2010-02-04 ENCOUNTER — Telehealth: Payer: Self-pay | Admitting: Internal Medicine

## 2010-02-08 ENCOUNTER — Telehealth: Payer: Self-pay | Admitting: Internal Medicine

## 2010-02-10 ENCOUNTER — Telehealth (INDEPENDENT_AMBULATORY_CARE_PROVIDER_SITE_OTHER): Payer: Self-pay | Admitting: *Deleted

## 2010-02-10 ENCOUNTER — Ambulatory Visit: Payer: Self-pay | Admitting: Internal Medicine

## 2010-02-10 DIAGNOSIS — K311 Adult hypertrophic pyloric stenosis: Secondary | ICD-10-CM | POA: Insufficient documentation

## 2010-02-11 ENCOUNTER — Ambulatory Visit: Payer: Self-pay | Admitting: Family Medicine

## 2010-02-11 ENCOUNTER — Encounter: Payer: Self-pay | Admitting: Internal Medicine

## 2010-02-12 ENCOUNTER — Ambulatory Visit (HOSPITAL_COMMUNITY): Admission: RE | Admit: 2010-02-12 | Discharge: 2010-02-12 | Payer: Self-pay | Admitting: Internal Medicine

## 2010-02-12 ENCOUNTER — Ambulatory Visit: Payer: Self-pay | Admitting: Internal Medicine

## 2010-02-26 ENCOUNTER — Telehealth: Payer: Self-pay | Admitting: Internal Medicine

## 2010-02-26 ENCOUNTER — Telehealth (INDEPENDENT_AMBULATORY_CARE_PROVIDER_SITE_OTHER): Payer: Self-pay | Admitting: *Deleted

## 2010-03-01 ENCOUNTER — Encounter: Payer: Self-pay | Admitting: Internal Medicine

## 2010-03-01 ENCOUNTER — Ambulatory Visit: Payer: Self-pay | Admitting: Internal Medicine

## 2010-03-01 DIAGNOSIS — K625 Hemorrhage of anus and rectum: Secondary | ICD-10-CM

## 2010-03-01 DIAGNOSIS — K648 Other hemorrhoids: Secondary | ICD-10-CM | POA: Insufficient documentation

## 2010-03-01 DIAGNOSIS — R634 Abnormal weight loss: Secondary | ICD-10-CM

## 2010-03-03 ENCOUNTER — Inpatient Hospital Stay (HOSPITAL_COMMUNITY): Admission: RE | Admit: 2010-03-03 | Discharge: 2010-03-10 | Payer: Self-pay | Admitting: Internal Medicine

## 2010-03-03 ENCOUNTER — Ambulatory Visit: Payer: Self-pay | Admitting: Internal Medicine

## 2010-03-04 ENCOUNTER — Encounter: Payer: Self-pay | Admitting: Internal Medicine

## 2010-03-22 ENCOUNTER — Telehealth: Payer: Self-pay | Admitting: Nurse Practitioner

## 2010-03-25 ENCOUNTER — Ambulatory Visit: Payer: Self-pay | Admitting: Internal Medicine

## 2010-03-25 LAB — CONVERTED CEMR LAB
Basophils Absolute: 0 10*3/uL (ref 0.0–0.1)
HCT: 36.8 % (ref 36.0–46.0)
Lymphs Abs: 1.2 10*3/uL (ref 0.7–4.0)
Monocytes Absolute: 0.5 10*3/uL (ref 0.1–1.0)
Monocytes Relative: 4.4 % (ref 3.0–12.0)
Platelets: 261 10*3/uL (ref 150.0–400.0)
RDW: 14.9 % — ABNORMAL HIGH (ref 11.5–14.6)
Sed Rate: 11 mm/hr (ref 0–22)

## 2010-04-13 ENCOUNTER — Telehealth (INDEPENDENT_AMBULATORY_CARE_PROVIDER_SITE_OTHER): Payer: Self-pay | Admitting: *Deleted

## 2010-04-14 ENCOUNTER — Inpatient Hospital Stay (HOSPITAL_COMMUNITY)
Admission: EM | Admit: 2010-04-14 | Discharge: 2010-04-21 | Payer: Self-pay | Source: Home / Self Care | Attending: Internal Medicine | Admitting: Internal Medicine

## 2010-04-14 ENCOUNTER — Encounter: Payer: Self-pay | Admitting: Family Medicine

## 2010-04-14 ENCOUNTER — Ambulatory Visit
Admission: RE | Admit: 2010-04-14 | Discharge: 2010-04-14 | Payer: Self-pay | Source: Home / Self Care | Attending: Family Medicine | Admitting: Family Medicine

## 2010-04-14 DIAGNOSIS — R000 Tachycardia, unspecified: Secondary | ICD-10-CM

## 2010-04-15 ENCOUNTER — Encounter (INDEPENDENT_AMBULATORY_CARE_PROVIDER_SITE_OTHER): Payer: Self-pay | Admitting: Emergency Medicine

## 2010-04-18 ENCOUNTER — Encounter (INDEPENDENT_AMBULATORY_CARE_PROVIDER_SITE_OTHER): Payer: Self-pay | Admitting: *Deleted

## 2010-04-20 ENCOUNTER — Encounter: Payer: Self-pay | Admitting: Emergency Medicine

## 2010-04-21 LAB — GLUCOSE, CAPILLARY: Glucose-Capillary: 93 mg/dL (ref 70–99)

## 2010-04-28 ENCOUNTER — Ambulatory Visit
Admission: RE | Admit: 2010-04-28 | Discharge: 2010-04-28 | Payer: Self-pay | Source: Home / Self Care | Attending: Emergency Medicine | Admitting: Emergency Medicine

## 2010-04-28 DIAGNOSIS — J8409 Other alveolar and parieto-alveolar conditions: Secondary | ICD-10-CM | POA: Insufficient documentation

## 2010-05-05 ENCOUNTER — Ambulatory Visit
Admission: RE | Admit: 2010-05-05 | Discharge: 2010-05-05 | Payer: Self-pay | Source: Home / Self Care | Attending: Internal Medicine | Admitting: Internal Medicine

## 2010-05-09 ENCOUNTER — Encounter: Payer: Self-pay | Admitting: Orthopedic Surgery

## 2010-05-09 ENCOUNTER — Encounter: Payer: Self-pay | Admitting: Internal Medicine

## 2010-05-09 ENCOUNTER — Encounter: Payer: Self-pay | Admitting: Family Medicine

## 2010-05-09 ENCOUNTER — Encounter: Payer: Self-pay | Admitting: Gastroenterology

## 2010-05-14 ENCOUNTER — Encounter: Payer: Self-pay | Admitting: Family Medicine

## 2010-05-14 ENCOUNTER — Ambulatory Visit
Admission: RE | Admit: 2010-05-14 | Discharge: 2010-05-14 | Payer: Self-pay | Source: Home / Self Care | Attending: Family Medicine | Admitting: Family Medicine

## 2010-05-14 DIAGNOSIS — R7309 Other abnormal glucose: Secondary | ICD-10-CM | POA: Insufficient documentation

## 2010-05-14 DIAGNOSIS — N390 Urinary tract infection, site not specified: Secondary | ICD-10-CM | POA: Insufficient documentation

## 2010-05-14 LAB — CONVERTED CEMR LAB
Nitrite: POSITIVE
Urobilinogen, UA: 1

## 2010-05-14 NOTE — Consult Note (Signed)
NAMEMurry, [name]                  ACCOUNT NO.:  [id number]  MEDICAL RECORD NO.:  [id number]          PATIENT TYPE:  INP  LOCATION:  3306                         FACILITY:  [facility]  PHYSICIAN:  Pricilla Riffle, MD, FACCDATE OF BIRTH:  Apr 27, 1930  DATE OF CONSULTATION:  04/15/2010 DATE OF DISCHARGE:                                CONSULTATION   IDENTIFICATION:  The patient is a 77 year old who were asked to see regarding CHF.  HISTORY OF PRESENT ILLNESS:  The patient has no known history of coronary artery disease or congestive heart failure.  She was admitted on December 28 with increased dyspnea, orthopnea x1 week.  She complained of her heart racing at home when she did things experiencing sweats with these episodes.  She denied chest pain.  No edema.  No cough.  Did never took temperature.  Felt weak overall.  The patient was admitted in the middle of the night.  Today, she says she is breathing somewhat better, has received Lasix x1 as well as IV antibiotics.  ALLERGIES:  Morphine and Demerol.  MEDICATIONS PRIOR TO ADMISSION: 1. Aspirin 81. 2. Asacol 800 q.i.d. 3. Advair Diskus. 4. Ativan Carafate 1 g b.i.d. 5. Crestor 20. 6. Lexapro 20. 7. Lumigan eyedrops. 8. Micardis HCT 40/12.5. 9. Nexium. 10.Ventolin. 11.B12.  In the hospital; 1. Zithromax. 2. Prednisone. 3. Solu-Cortef 50 q.8. 4. Asacol. 5. Ventolin. 6. Advair. 7. B12. 8. Lumigan. 9. Crestor 20. 10.Lexapro. 11.Lorazepam. 12.Vancomycin 13.Zosyn. 14.Xopenex.  PAST MEDICAL HISTORY: 1. Emphysema. 2. Dyslipidemia. 3. Ulcerative colitis, recently hospitalized. 4. Hypertension. 5. Peptic ulcer disease. 6. Hiatal hernia. 7. Osteoporosis. 8. History of breast cancer. 9. History of anxiety. 10.History of UTI. 11.Depression.  PAST SURGICAL HISTORY:  Cataract surgery, TAH, hemorrhoid surgery, right breast reconstruction, total knee replacement, tonsillectomy.  SOCIAL HISTORY:  The patient is married.   Her husband is a heavy smoker. She does not smoke, does not drink.  FAMILY HISTORY:  Mother died of a CVA.  Father died of colon cancer. Two sisters with CAD.  Two brothers with CAD.  Also positive for breast cancer, Crohn disease, and hypertension.  REVIEW OF SYSTEMS:  All systems reviewed, negative to the above problem except as noted previously.  PHYSICAL EXAMINATION:  GENERAL:  The patient is in no acute distress in bed. VITAL SIGNS:  Temperature max at 4:00 a.m. is 101, now 97.3; pulse is in the 70s (tele shows sinus rhythm); blood pressure is 91-123 over 50-60; O2 sat on 3 L 96%. HEENT:  Normocephalic, atraumatic.  EOMI.  PERRL. NECK:  Supple.  JVP is normal.  No bruits. LUNGS:  Diffuse rales bilaterally.  No wheezes, rales worse in the right lower lobe, left upper lobe, left lower lobe. ABDOMEN:  Supple, nontender.  No hepatomegaly. EXTREMITIES:  Good distal pulses.  No lower extremity edema. MUSCULOSKELETAL:  Moving all extremities. NEURO:  Alert and oriented x3.  Cranial nerves II-XII grossly intact.  Chest x-ray shows a large hiatal hernia, interval progression of diffuse but asymmetric airspace disease left greater than right.  CT shows no pulmonary edema.  Diffuse airspace disease, edema versus infiltrate  and a paraesophageal hernia. A 12-lead EKG. 1. Sinus tachycardia at 107 beats per minute.  T-wave inversion V1,     V2, aVL. 2. Sinus tachycardia 116, T-wave inversion in V1.  Labs significant for hemoglobin of 13.2, WBC of 7.5, BUN and creatinine of 18 and 0.6, potassium of 3.6, TSH 1.24, D-dimer 0.91, hemoglobin A1c 6.9, BNP of 46.  CK-MB negative x3.  Troponin negative x3.  ESR of 29, LDL of 97, HDL 53, triglycerides 110, total cholesterol of 172.  IMPRESSION:  The patient is a 77 year old woman with a history of ulcerative colitis (on steroids recent hospitalization), hiatal hernia, diabetes.  She presents with a 1-week history of dyspnea,  orthopnea, palpitations, weakness, sweats.  On admission, she was felt to be in congestive heart failure.  She has received Lasix x1.  Troponin CK were negative.  BNP was only 46 on admission, WBC normal at 7.5.  Chest x-ray with bilateral airspace disease (asymmetric CT negative for PE).  EKG without ischemic changes.  Exam; febrile earlier, now afebrile.  JVP is normal.  Diffuse rales.  No edema.  No hepatomegaly.  Echo today shows normal left ventricular function with no wall motion abnormalities.  On review of the above, I am not completely convinced that the presentation represents congestive heart failure alone, question atypical pneumonia.  She has received Lasix x1 with some response.  I would continue antibiotics.  Continue stress dose steroids.  Continue NMTs  Repeat Lasix x1.  Check labs in a.m.  Repeat chest x-ray in the morning.  I have also discussed with pulmonary who plans to see in the morning.     Pricilla Riffle, MD, [facility]     PVR/MEDQ  D:  04/15/2010  T:  04/16/2010  Job:  [id number]  Electronically Signed by Dietrich Pates MD [facility] on 05/14/2010 07:55:15 PM

## 2010-05-18 ENCOUNTER — Telehealth (INDEPENDENT_AMBULATORY_CARE_PROVIDER_SITE_OTHER): Payer: Self-pay | Admitting: *Deleted

## 2010-05-18 NOTE — Progress Notes (Signed)
 ----   Converted from flag ---- ---- 02/26/2010 3:44 PM, Schuyler Amor wrote: Vernona Rieger, patient's daughter, called.  Would like to talk to you about changing her appointment from Tuesday to Monday, if it is still available.  Her phone number is [phone].  Thank you. ------------------------------  Spoke with Vernona Rieger, patient's daughter. Rescheduled patient to 03/01/10 at 1:30 PM with [name], RNP.

## 2010-05-18 NOTE — Progress Notes (Signed)
Summary: triage   Phone Note Call from Patient Call back at Work Phone [phone]   Caller: Daughter, [name] Call For: Dr. [name] Reason for Call: Talk to Nurse Summary of Call: pt has been having diarrhea for 8 weeks and daughter says its an emergency and pt needs to be seen asap... next available was too long to wait (December) Initial call taken by: Vallarie Mare,  February 26, 2010 10:15 AM  Follow-up for Phone Call        Patient's daughter calling to report that her mother has started having increased diarrhea again starting yesterday. Patient has been taking Levsin 0.125 mg two times a day since last Wednesday and the diarrhea had stopped completely earlier this week. Patient's daughter states the patient did not start the Levsin after her visit on 02/12/10 because she "wanted to get the barium out from the upper GI she had on that day."Yesterday and today she has had liquid, diarrhea stools every hour. Patient states" her belly feels like it is going to fall out when I stand up and is sore." Denies bleeding. Patient did have a temperature of 100.3 last night. Having chills and is weak today. Denies any family members being sick. Patient is also taking Nexium and Carafate. Please, advise. Follow-up by: Jesse Fall RN,  February 26, 2010 10:43 AM  Additional Follow-up for Phone Call Additional follow up Details #1::        last office note says she should take senekot as needed 4 hours for diarrhea. That is probably going to make diarrhea worse, should stop it if she is taking senekot.  she should stay on the levsin, can help with diarrhea Additional Follow-up by: Rachael Fee MD,  February 26, 2010 11:39 AM    Additional Follow-up for Phone Call Additional follow up Details #2::    Message left for patient to call back on daughter's number at work. Jesse Fall RN  February 26, 2010 1:21 PM Spoke with patient's daughter.Gave her recommendations per Dr. Christella Hartigan. She will be sure  patient is not taking Senekot. [name] (patient's daughter) wants patient to be seen next week for the continued diarrhea. Scheduled  patient with [name], RNP on 03/02/10 at 9:30 AM.

## 2010-05-18 NOTE — Letter (Signed)
Summary: Patient Notice- Colon Biospy Results  [facility]  [address] [place], [place] [address]   Phone: [phone]  Fax: [phone]        March 04, 2010 MRN: [id number]    [facility] [address] [place], [place]  [address]    Dear [name],  I am pleased to inform you that the biopsies taken during your recent colonoscopy did not show any evidence of cancer upon pathologic examination.The biopsies confirm Ulcerative colitis  Additional information/recommendations:  __No further action is needed at this time.  Please follow-up with      your primary care physician for your other healthcare needs.  __Please call [phone] to schedule a return visit to review      your condition.  __Continue with the treatment plan as outlined on the day of your      exam.  _x_You should have a repeat colonoscopy examination for this problem           in 5_ years.  Please call us if you are having persistent problems or have questions about your condition that have not been fully answered at this time.  Sincerely,  Hart Carwin MD   This letter has been electronically signed by your physician.  Appended Document: Patient Notice- Colon Biospy Results .letter

## 2010-05-18 NOTE — Progress Notes (Signed)
   Phone Note Outgoing Call Call back at radiology scheduling   Call placed by: Jesse Fall RN,  February 10, 2010 1:26 PM Action Taken: Appt scheduled Summary of Call: Scheduled patient for UGI series and small bowel folllow through as per endo report on 02/10/10. Scheduled with [facility] at [facility] on 02/12/10 @ 9AM with  8:45 arrival to register.  Patient to be NPO after midnight.   Follow-up for Phone Call        Patient scheduled for UGI and small bowel follow through at [facility] on 02/12/10 @ 9:00 AM with arrival at 8:45 to register. NPO after midnight. Patient given above information.  Follow-up by: Jesse Fall RN,  February 11, 2010 8:19 AM  New Problems: GASTRIC OUTLET OBSTRUCTION (ICD-537.0)   New Problems: GASTRIC OUTLET OBSTRUCTION (ICD-537.0)

## 2010-05-18 NOTE — Assessment & Plan Note (Signed)
Summary: rto 2 weeks/cbs   Vital Signs:  Patient profile:   77 year old female Weight:      179.25 pounds Pulse rate:   86 / minute Pulse rhythm:   regular BP sitting:   124 / 80  (left arm) Cuff size:   large  Vitals Entered By: Army Fossa CMA (October 09, 2009 12:44 PM) CC: Pt here for 2 week follow up, review labs   History of Present Illness: Pt here f/u depression. She is doing better but is tired.  She is taking lexapro in am.  Pt did make appointment to see Hospice counselor.    Pt also c/o R shoulder pain--pt can not lift arm up past 90 degrees.  Symptoms for 3-4 weeks.  Pain is worse some days and better others.  It does feel better today.  She thinks it is from carrying buckets of water.  No known injury.   Allergies: 1)  ! Morphine 2)  ! Demerol  Physical Exam  General:  Well-developed,well-nourished,in no acute distress; alert,appropriate and cooperative throughout examination Lungs:  Normal respiratory effort, chest expands symmetrically. Lungs are clear to auscultation, no crackles or wheezes. Heart:  normal rate and no murmur.   Psych:  Oriented X3, normally interactive, good eye contact, and tearful but much better than last visit.   Impression & Recommendations:  Problem # 1:  DEPRESSION (ICD-311) Take lexapro at night--- f/u 1 month  Her updated medication list for this problem includes:    Ativan 2 Mg Tabs (Lorazepam) .[name]... 1/2 by mouth at bedtime    Lexapro 20 Mg Tabs (Escitalopram oxalate) .[name]... 1 by mouth once daily  Problem # 2:  SHOULDER PAIN, RIGHT (ICD-719.41) Assessment: Improved consider ortho if no more improvement Her updated medication list for this problem includes:    Aleve 220 Mg Tabs (Naproxen sodium)    Aspir-low 81 Mg Tbec (Aspirin) .[name]... 1 by mouth once daily  Discussed shoulder exercises, use of moist heat or ice, and medication.   Complete Medication List: 1)  Ativan 2 Mg Tabs (Lorazepam) .... 1/2 by mouth at bedtime 2)   Lexapro 20 Mg Tabs (Escitalopram oxalate) .[name].. 1 by mouth once daily 3)  Crestor 20 Mg Tabs (Rosuvastatin calcium) .... Take one tablet each evening at bedtime-labs due now 4)  Prilosec 40 Mg (omeprazole)  .[name].. 1 by mouth once daily 5)  Micardis Hct 40-12.5 Mg Tabs (Telmisartan-hctz) .[name].. 1 by mouth qd 6)  Lumigan 0.03 % Soln (Bimatoprost) .... Daily as directed 7)  Vitamin B-12 1000 Mcg Tabs (Cyanocobalamin) .... Take 1 tab once daily 8)  Aleve 220 Mg Tabs (Naproxen sodium) 9)  Aspir-low 81 Mg Tbec (Aspirin) .[name].. 1 by mouth once daily 10)  Advair Diskus 250-50 Mcg/dose Aepb (Fluticasone-salmeterol) .[name].. 1 inh two times a day 11)  Ventolin Hfa 108 (90 Base) Mcg/act Aers (Albuterol sulfate) .... 2 puffs qid as needed  Patient Instructions: 1)  Please schedule a follow-up appointment in 1 month.  Prescriptions: VENTOLIN HFA 108 (90 BASE) MCG/ACT AERS (ALBUTEROL SULFATE) 2 puffs qid as needed  #1 x 1   Entered and Authorized by:   [name] DO   Signed by:   [name] DO on 10/09/2009   Method used:   Electronically to        [id number] (retail)       [address]       [place], [place]  [address]  Ph: [phone]       Fax: [phone]   RxID:   [id number] ADVAIR DISKUS 250-50 MCG/DOSE AEPB (FLUTICASONE-SALMETEROL) 1 inh two times a day  #1 x 2   Entered and Authorized by:   [name] DO   Signed by:   [name] DO on 10/09/2009   Method used:   Electronically to        [id number] (retail)       [address] [place], [place]  [address]       Ph: [phone]       Fax: [phone]   RxID:   [id number] ATIVAN 2 MG TABS (LORAZEPAM) 1/2 by mouth at bedtime  #30 x 1   Entered and Authorized by:   [name] DO   Signed by:   [name] DO on 10/09/2009   Method used:   Print then Give to Patient   RxID:   [id number]

## 2010-05-18 NOTE — Progress Notes (Signed)
   Phone Note Outgoing Call   Call placed by: Midge Minium Call placed to: Patient Summary of Call: Realized patient didn't get Asacol prescription upon hospital discharge. I called in Asacol 400mg  3 tabs three times a day to her pharm and patient notified. She is feeling 100% better these days. Having only one stool a day (non-bloody). She has gained three pounds. No abdominal pain.  Initial call taken by: [name] NP,  March 22, 2010 6:04 PM

## 2010-05-18 NOTE — Procedures (Signed)
Summary: Colonoscopy  Patient: [name] Note: All result statuses are Final unless otherwise noted.  Tests: (1) Colonoscopy (COL)   COL Colonoscopy           DONE     [facility]     [address] [place], [place]  [address]           COLONOSCOPY PROCEDURE REPORT           PATIENT:  [name], [name]  MR#:  [id number]     BIRTHDATE:  05-05-1930, 79 yrs. old  GENDER:  female     ENDOSCOPIST:  [name]. [name], MD     REF. BY:  [name], DO     PROCEDURE DATE:  03/03/2010     PROCEDURE:  Colonoscopy with biopsy     ASA CLASS:  Class II     INDICATIONS:  unexplained diarrhea, hematochezia f hx of colon     cancer, last colon 2009 was normal except for mild diverticulosis           hx of "colitis"     MEDICATIONS:   Versed 10 mg, Fentanyl 125 mcg, Benadryl 25 gm           DESCRIPTION OF PROCEDURE:   After the risks benefits and     alternatives of the procedure were thoroughly explained, informed     consent was obtained.  Digital rectal exam was performed and     revealed tender.   The Pentax Ped Colon [id number] endoscope was     introduced through the anus and advanced to the cecum, which was     identified by both the appendix and ileocecal valve, without     limitations.  The quality of the prep was good, using MiraLax.     The instrument was then slowly withdrawn as the colon was fully     examined.     <<PROCEDUREIMAGES>>           FINDINGS:  There were mucosal changes consistent with universal     ulcerative colitis. throughout the colon. severe pancolitis,     bleeding, edema, granilarity, pseudopolyps at 60 cm, thickened     wall due to edema, most severe changes at splenic flexure     Random biopsies were obtained and sent to pathology (see image1,     image2, image3, image4, image5, image6, image7, image8, image9,     image10, image11, image12, and image13). Bx'r right colon, left     colon and rectum, biopsies also taken for viral culture  Mild  diverticulosis was found.   Retroflexed views in the rectum     revealed proctitis.    The scope was then withdrawn from the     patient and the procedure completed.           COMPLICATIONS:  None     ENDOSCOPIC IMPRESSION:     1) Colitis- universal UC throughout the colon     2) Mild diverticulosis     3) Proctitis     severe colitis, likely UC     RECOMMENDATIONS:     1) Await biopsy results     admit, see orders     REPEAT EXAM:  In 3 year(s) for.           ______________________________     [name]. [name], MD           CC:  n.     eSIGNED:   [name]. Soul Deveney at 03/03/2010 12:06 PM           Jerene Bears, [id number]  Note: An exclamation mark (!) indicates a result that was not dispersed into the flowsheet. Document Creation Date: 03/03/2010 12:06 PM _______________________________________________________________________  (1) Order result status: Final Collection or observation date-time: 03/03/2010 11:59 Requested date-time:  Receipt date-time:  Reported date-time:  Referring Physician:   Ordering Physician: [name] [phone]) Specimen Source:  Source: Launa Grill Order Number: [id number] Lab site:

## 2010-05-18 NOTE — Progress Notes (Signed)
Summary: Results  Phone Note Outgoing Call   Call placed by: Army Fossa CMA,  October 05, 2009 11:19 AM Reason for Call: Discuss lab or test results Summary of Call: Regarding Chest X-ray, LMTCB:  nothing acute---  stable copd---  refer to pulm if cough con't   Signed by [name] DO on 10/05/2009 at 10:42 AM   Follow-up for Phone Call        Pt is aware.Army Fossa CMA  October 05, 2009 2:31 PM

## 2010-05-18 NOTE — Assessment & Plan Note (Signed)
Summary: STOMACH PROB FOR 2 WEEKS///SPH   Vital Signs:  Patient profile:   77 year old female Weight:      177.8 pounds Temp:     98.4 degrees F oral Pulse rate:   76 / minute Pulse rhythm:   regular BP sitting:   110 / 72  (right arm) Cuff size:   regular  Vitals Entered By: Almeta Monas CMA Duncan Dull) (February 01, 2010 9:44 AM) CC: c/o constipation that turned into diarrhea with abd pain and nausea x2wks and right sided hip pain x1day, Abdominal Pain   History of Present Illness:       This is a 77 year old woman who presents with Abdominal Pain.  The symptoms began 2 weeks ago.  Pt here c/o constipation that has turned to diarrha---symptoms started 2 weeks ago. Everything pt eats or drinks goes right through her.  The patient reports diarrhea and constipation, but denies nausea, vomiting, melena, hematochezia, anorexia, and hematemesis.  The location of the pain is diffuse.  The pain is described as intermittent and cramping in quality.  The patient denies the following symptoms: fever, weight loss, dysuria, chest pain, jaundice, dark urine, missed menstrual period, and vaginal bleeding.  The pain is worse with food.  Pt took Immodium with some relief but as soon as she eats food she has diarrhea again.  pt has not travelled anywhere and has not eaten anything that caused it.  She has also not been on an abx recently.    Current Medications (verified): 1)  Ativan 2 Mg Tabs (Lorazepam) .... 1/2 By Mouth At Bedtime 2)  Lexapro 20 Mg Tabs (Escitalopram Oxalate) .[name].. 1 By Mouth Once Daily 3)  Crestor 20 Mg Tabs (Rosuvastatin Calcium) .... Take One Tablet Each Evening At Bedtime- 4)  Nexium 40 Mg Cpdr (Esomeprazole Magnesium) .[name].. 1 By Mouth Once Daily 5)  Micardis Hct 40-12.5 Mg  Tabs (Telmisartan-Hctz) .[name].. 1 By Mouth Qd 6)  Lumigan 0.03 %  Soln (Bimatoprost) .... Daily As Directed 7)  Vitamin B-12 1000 Mcg Tabs (Cyanocobalamin) .... Take 1 Tab Once Daily 8)  Aleve 220 Mg Tabs (Naproxen  Sodium) 9)  Aspir-Low 81 Mg Tbec (Aspirin) .[name].. 1 By Mouth Once Daily 10)  Advair Diskus 250-50 Mcg/dose Aepb (Fluticasone-Salmeterol) .[name].. 1 Inh Two Times A Day 11)  Ventolin Hfa 108 (90 Base) Mcg/act Aers (Albuterol Sulfate) .... 2 Puffs Qid As Needed 12)  Vitamin D3 2000 Unit Caps (Cholecalciferol) .[name].. 1 By Mouth Once Daily 13)  Levsin/sl 0.125 Mg Subl (Hyoscyamine Sulfate) .[name].. 1-2 Sl Every 4 Hours As Needed Diarrhea  Allergies (verified): 1)  ! Morphine 2)  ! Demerol  Past History:  Past medical, surgical, family and social histories (including risk factors) reviewed for relevance to current acute and chronic problems.  Past Medical History: Reviewed history from 06/07/2007 and no changes required. Hypertension RIGHT BREAST CANCER s/p tamoxifen 13 years ago Osteoporosis COPD Hyperlipidemia Anxiety Depression Peptic ulcer disease hiatal hernia  Past Surgical History: Reviewed history from 06/28/2007 and no changes required. RECONSTRUCT RIGHT BREAST(2000) Cataract extraction (08/2006) Cataract extraction (10/2006) Total knee replacement (01/11/2007)---Dr Charlann Boxer  Family History: Reviewed history from 12/19/2006 and no changes required. Family History of CAD Female 1st degree relative 2 sisters in 21s Family History of CAD Female 1st degree relative 2 brothers in 74s Family History Diabetes 1st degree relative Family History of Colon CA 1st degree relative at 37 yo-- father Family History Hypertension Family History of Stroke M 1st degree relative  at 40-- mother  Social History: Reviewed history from 12/19/2006 and no changes required. Occupation:sales Married Never Smoked Alcohol use-no Drug use-no Regular exercise-no  Review of Systems      See HPI  Physical Exam  General:  alert, well-developed, and well-hydrated.   in mild distress Mouth:  Oral mucosa and oropharynx without lesions or exudates.  Teeth in good repair. Abdomen:  Bowel sounds positive,abdomen  soft and non-tender without masses, organomegaly or hernias noted. Rectal:  no masses, stool positive for occult blood, and external hemorrhoid(s).   Msk:  + R hip pain with palpation and weight bearing Psych:  Cognition and judgment appear intact. Alert and cooperative with normal attention span and concentration. No apparent delusions, illusions, hallucinations   Impression & Recommendations:  Problem # 1:  DIARRHEA (ICD-787.91)  Orders: Venipuncture (16109) TLB-BMP (Basic Metabolic Panel-BMET) (80048-METABOL) TLB-CBC Platelet - w/Differential (85025-CBCD) TLB-Hepatic/Liver Function Pnl (80076-HEPATIC) TLB-TSH (Thyroid Stimulating Hormone) (84443-TSH) T-Culture, Stool (87045/87046-70140) T-Culture, C-Diff Toxin A/B (60454-09811) Specimen Handling (91478)  Discussed symptom control and diet. Call if worsening of symptoms or signs of dehydration.   Problem # 2:  HIP PAIN, RIGHT (ICD-719.45)  Her updated medication list for this problem includes:    Aleve 220 Mg Tabs (Naproxen sodium)    Aspir-low 81 Mg Tbec (Aspirin) .[name]... 1 by mouth once daily  Orders: T-Hip Comp Right Min 2 views (73510TC) T-Lumbar Spine 2 Views (72100TC) UA Dipstick w/o Micro (manual) (29562)  Discussed use of medications, application of heat or cold, and exercises.   Problem # 3:  GUAIAC POSITIVE STOOL (ICD-578.1)  nexium 1 by mouth once daily  check labs f/u GI  Orders: UA Dipstick w/o Micro (manual) (13086)  Complete Medication List: 1)  Ativan 2 Mg Tabs (Lorazepam) .... 1/2 by mouth at bedtime 2)  Lexapro 20 Mg Tabs (Escitalopram oxalate) .[name].. 1 by mouth once daily 3)  Crestor 20 Mg Tabs (Rosuvastatin calcium) .... Take one tablet each evening at bedtime- 4)  Nexium 40 Mg Cpdr (Esomeprazole magnesium) .[name].. 1 by mouth once daily 5)  Micardis Hct 40-12.5 Mg Tabs (Telmisartan-hctz) .[name].. 1 by mouth qd 6)  Lumigan 0.03 % Soln (Bimatoprost) .... Daily as directed 7)  Vitamin B-12 1000 Mcg Tabs  (Cyanocobalamin) .... Take 1 tab once daily 8)  Aleve 220 Mg Tabs (Naproxen sodium) 9)  Aspir-low 81 Mg Tbec (Aspirin) .[name].. 1 by mouth once daily 10)  Advair Diskus 250-50 Mcg/dose Aepb (Fluticasone-salmeterol) .[name].. 1 inh two times a day 11)  Ventolin Hfa 108 (90 Base) Mcg/act Aers (Albuterol sulfate) .... 2 puffs qid as needed 12)  Vitamin D3 2000 Unit Caps (Cholecalciferol) .[name].. 1 by mouth once daily 13)  Levsin/sl 0.125 Mg Subl (Hyoscyamine sulfate) .[name].. 1-2 sl every 4 hours as needed diarrhea  Patient Instructions: 1)  Drink clear liquids only for the next 24 hours, then slowly add other liquids and food as you tolerate them .  2)  Oral rehydration solution: Drink 1/2 ounce every 15 minutes. If tolerated after 1 hour, drink 1 ounce every 15 minutes. As you  can tolerate, keep adding 1/2 ounce every 15 minutes, up to a total or 2-4 ounces. Contact the office if unable to tolerate oral solution, if you keep vomiting, or you continue to have signs of dehydration. 3)  The main problem with gastroentereritis is dehydration. Drink plenty of fluids and take solids as you feel better. If you are unable to keep anything down and/or you show signs of dehydration( dry cracked  lips, lack of tears, not urinating, very sleepy) , call our office.  Prescriptions: ATIVAN 2 MG TABS (LORAZEPAM) 1/2 by mouth at bedtime  #30 x 1   Entered and Authorized by:   [name] DO   Signed by:   [name] DO on 02/01/2010   Method used:   Print then Give to Patient   RxID:   [id number] LEVSIN/SL 0.125 MG SUBL (HYOSCYAMINE SULFATE) 1-2 sl every 4 hours as needed diarrhea  #30 x 0   Entered and Authorized by:   [name] DO   Signed by:   [name] DO on 02/01/2010   Method used:   Electronically to        [facility] [address] [id number]* (retail)       [address] [place], [place]  [address]       Ph: [phone]       Fax: [phone]   RxID:   [id number]    Orders Added: 1)   Venipuncture [47425] 2)  TLB-BMP (Basic Metabolic Panel-BMET) [80048-METABOL] 3)  TLB-CBC Platelet - w/Differential [85025-CBCD] 4)  TLB-Hepatic/Liver Function Pnl [80076-HEPATIC] 5)  TLB-TSH (Thyroid Stimulating Hormone) [84443-TSH] 6)  T-Culture, Stool [87045/87046-70140] 7)  T-Culture, C-Diff Toxin A/B [95638-75643] 8)  T-Hip Comp Right Min 2 views [73510TC] 9)  T-Lumbar Spine 2 Views [72100TC] 10)  Est. Patient Level IV [32951] 11)  UA Dipstick w/o Micro (manual) [81002] 12)  Specimen Handling [99000]      Laboratory Results   Urine Tests    Routine Urinalysis   Color: orange Appearance: Hazy Glucose: negative   (Normal Range: Negative) Bilirubin: moderate   (Normal Range: Negative) Ketone: moderate (40)   (Normal Range: Negative) Spec. Gravity: 1.025   (Normal Range: 1.003-1.035) Blood: negative   (Normal Range: Negative) pH: 5.0   (Normal Range: 5.0-8.0) Protein: 30   (Normal Range: Negative) Urobilinogen: 2.0   (Normal Range: 0-1) Nitrite: negative   (Normal Range: Negative) Leukocyte Esterace: negative   (Normal Range: Negative)

## 2010-05-18 NOTE — Progress Notes (Signed)
Summary: Needs soon appt for pos stool card 10/17  ---- Converted from flag ---- ---- 02/01/2010 10:19 AM, [name] DO wrote: please call Dr Regino Schultze office---(GI) and let them know she is having diarrhea and rectal bleeding---see if she can see someone else since Dr [name] is out of the office ------------------------------  Called to Dr. Delia Chimes office, adv pos stool card and asked for sooner appt, Will have triage nurse c/b..... Almeta Monas CMA Duncan Dull)  February 01, 2010 10:57 AM  spk with pt and advise of the appt schedule 02/03/10 @ 9 am with Dr. [name]. Pt voiced understanding.   Almeta Monas CMA Duncan Dull)  February 01, 2010 11:47 AM

## 2010-05-18 NOTE — Letter (Signed)
Summary: Patient Notice-Endo Biopsy Results  [facility]  [address] [place], [place] [address]   Phone: [phone]  Fax: [phone]        February 11, 2010 MRN: [id number]    [facility] [address] [place], [place]  [address]    Dear [name],  I am pleased to inform you that the biopsies taken during your recent endoscopic examination did not show any evidence of cancer upon pathologic examination.There is an inflammation and swelling  at the area of the ulcer which has been causing partial obstruction.  Additional information/recommendations:  __No further action is needed at this time.  Please follow-up with      your primary care physician for your other healthcare needs.  _x_ Please call [phone] to schedule a return visit to review      your condition.  _x_ Continue with the treatment plan as outlined on the day of your      exam.     Please call us if you are having persistent problems or have questions about your condition that have not been fully answered at this time.  Sincerely,  Hart Carwin MD  This letter has been electronically signed by your physician.  Appended Document: Patient Notice-Endo Biopsy Results letter mailed

## 2010-05-18 NOTE — Assessment & Plan Note (Signed)
Summary: cpx/cbs   Vital Signs:  Patient profile:   77 year old female Height:      65.25 inches Weight:      175 pounds Temp:     98.0 degrees F oral Pulse rate:   80 / minute Resp:     18 per minute BP sitting:   110 / 70  (left arm)  Vitals Entered By: Jeremy Johann CMA (February 11, 2010 1:06 PM) CC: discuss test results   History of Present Illness: Pt supposed to be here for cpe but she really doesn't want this done. she has questions about everything being done here and with Dr [name].   Current Medications (verified): 1)  Ativan 2 Mg Tabs (Lorazepam) .... 1/2 By Mouth At Bedtime 2)  Lexapro 20 Mg Tabs (Escitalopram Oxalate) .[name].. 1 By Mouth Once Daily 3)  Crestor 20 Mg Tabs (Rosuvastatin Calcium) .... Take One Tablet Each Evening At Bedtime- 4)  Nexium 40 Mg Cpdr (Esomeprazole Magnesium) .[name].. 1 By Mouth Once Daily 5)  Micardis Hct 40-12.5 Mg  Tabs (Telmisartan-Hctz) .[name].. 1 By Mouth Qd 6)  Lumigan 0.03 %  Soln (Bimatoprost) .... Daily As Directed 7)  Vitamin B-12 1000 Mcg Tabs (Cyanocobalamin) .... Take 1 Tab Once Daily 8)  Aleve 220 Mg Tabs (Naproxen Sodium) 9)  Aspir-Low 81 Mg Tbec (Aspirin) .[name].. 1 By Mouth Once Daily 10)  Advair Diskus 250-50 Mcg/dose Aepb (Fluticasone-Salmeterol) .[name].. 1 Inh Two Times A Day 11)  Ventolin Hfa 108 (90 Base) Mcg/act Aers (Albuterol Sulfate) .... 2 Puffs Qid As Needed 12)  Vitamin D3 2000 Unit Caps (Cholecalciferol) .[name].. 1 By Mouth Once Daily 13)  Levsin/sl 0.125 Mg Subl (Hyoscyamine Sulfate) .[name].. 1-2 Sl Every 4 Hours As Needed Diarrhea 14)  Bc Headache 325-16-95 Mg Tabs (Aspirin-Salicylamide-Caffeine) .... As Needed  Allergies (verified): 1)  ! Morphine 2)  ! Demerol  Past History:  Family History: Last updated: 02/02/2010 Family History of CAD Female 1st degree relative 2 sisters in 63s Family History of CAD Female 1st degree relative 2 brothers in 80s Family History Diabetes 1st degree relative Family History of Colon CA 1st  degree relative at 99 yo-- father Family History Hypertension Family History of Stroke M 1st degree relative at 27-- mother Family History of Crohn's: Brother Family History of Breast Cancer: Aunt Family History of Heart Disease: 2 Brothers, 1 Sister Family History of Cirrhosis: Brother  Social History: Last updated: 02/02/2010 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 Alcohol use-no Drug use-no Regular exercise-no  Risk Factors: Alcohol Use: 0 (12/11/2008) Exercise: no (12/11/2008)  Risk Factors: Smoking Status: never (12/11/2008) Passive Smoke Exposure: no (12/11/2008)  Past medical, surgical, family and social histories (including risk factors) reviewed for relevance to current acute and chronic problems.  Past Medical History: Reviewed history from 02/02/2010 and no changes required. Hypertension RIGHT BREAST CANCER s/p tamoxifen 13 years ago Osteoporosis COPD Hyperlipidemia Anxiety Depression Peptic ulcer disease hiatal hernia  Past Surgical History: Reviewed history from 02/02/2010 and no changes required. RECONSTRUCT RIGHT BREAST(2000) Cataract extraction (08/2006) Cataract extraction (10/2006) Total Left knee replacement (01/11/2007)---Dr [facility] Hysterectomy Hemorrhoidectomy Tonsillectomy  Family History: Reviewed history from 02/02/2010 and no changes required. Family History of CAD Female 1st degree relative 2 sisters in 100s Family History of CAD Female 1st degree relative 2 brothers in 19s Family History Diabetes 1st degree relative Family History of Colon CA 1st degree relative at 71 yo-- father Family History Hypertension Family History of Stroke M 1st degree relative at 65-- mother Family History  of Crohn's: Brother Family History of Breast Cancer: Aunt Family History of Heart Disease: 2 Brothers, 1 Sister Family History of Cirrhosis: Brother  Social History: Reviewed history from 02/02/2010 and no changes  required. 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 Alcohol use-no Drug use-no Regular exercise-no  Review of Systems      See HPI  Physical Exam  General:  Well-developed,well-nourished,in no acute distress; alert,appropriate and cooperative throughout examination Psych:  depressed affect and tearful.     Impression & Recommendations:  Problem # 1:  GUAIAC POSITIVE STOOL (ICD-578.1) con' nexium f/u GI Barium to be done tomorrow EGD done may may need repeat colon per GI  Problem # 2:  COLITIS (ICD-558.9)  Problem # 3:  DEPRESSION (ICD-311) Assessment: Improved  Her updated medication list for this problem includes:    Ativan 2 Mg Tabs (Lorazepam) .[name]... 1/2 by mouth at bedtime    Lexapro 20 Mg Tabs (Escitalopram oxalate) .[name]... 1 by mouth once daily  Complete Medication List: 1)  Ativan 2 Mg Tabs (Lorazepam) .... 1/2 by mouth at bedtime 2)  Lexapro 20 Mg Tabs (Escitalopram oxalate) .[name].. 1 by mouth once daily 3)  Crestor 20 Mg Tabs (Rosuvastatin calcium) .... Take one tablet each evening at bedtime- 4)  Nexium 40 Mg Cpdr (Esomeprazole magnesium) .[name].. 1 by mouth once daily 5)  Micardis Hct 40-12.5 Mg Tabs (Telmisartan-hctz) .[name].. 1 by mouth qd 6)  Lumigan 0.03 % Soln (Bimatoprost) .... Daily as directed 7)  Vitamin B-12 1000 Mcg Tabs (Cyanocobalamin) .... Take 1 tab once daily 8)  Aleve 220 Mg Tabs (Naproxen sodium) 9)  Aspir-low 81 Mg Tbec (Aspirin) .[name].. 1 by mouth once daily 10)  Advair Diskus 250-50 Mcg/dose Aepb (Fluticasone-salmeterol) .[name].. 1 inh two times a day 11)  Ventolin Hfa 108 (90 Base) Mcg/act Aers (Albuterol sulfate) .... 2 puffs qid as needed 12)  Vitamin D3 2000 Unit Caps (Cholecalciferol) .[name].. 1 by mouth once daily 13)  Levsin/sl 0.125 Mg Subl (Hyoscyamine sulfate) .[name].. 1-2 sl every 4 hours as needed diarrhea 14)  Bc Headache 325-16-95 Mg Tabs (Aspirin-salicylamide-caffeine) .... As needed  Patient Instructions: 1)  reschedule cpe for  spring   Orders Added: 1)  Est. Patient Level III [99213]    Last Flu Vaccine:  Fluvax MCR (01/06/2010 9:55:29 AM) Flu Vaccine Result Date:  01/06/2010 Flu Vaccine Result:  given Flu Vaccine Next Due:  1 yr Last Mammogram:  ASSESSMENT: Negative - BI-RADS 1^MM DIGITAL SCREENING (04/24/2009 2:15:00 PM) Mammogram Result Date:  04/24/2009 Mammogram Result:  normal Mammogram Next Due:  1 yr Bone Density Result Date:  10/06/2009 Bone Density Result:  osteoporosis Bone Density Next Due: 2 yr

## 2010-05-18 NOTE — Progress Notes (Signed)
Summary: Sooner appt   Phone Note From Other Clinic   Caller: [facility] @ Dr Laury Axon [phone] Call For: Dr [name] Reason for Call: Schedule Patient Appt Summary of Call: Wants patient seen sooner than appt scheduled on 10-28th for  a hem pos stool card. Initial call taken by: Leanor Kail [facility],  February 01, 2010 10:33 AM  Follow-up for Phone Call        patient rescheduled for 02/03/10 9:00 with Dr [name].  Kim with Dr Laury Axon will notify the patient of the appointment change. Follow-up by: Darcey Nora RN, CGRN,  February 01, 2010 11:43 AM

## 2010-05-18 NOTE — Progress Notes (Signed)
Summary: Triage   Phone Note Call from Patient Call back at Home Phone [phone]   Caller: Patient Call For: Dr. [name] Reason for Call: Talk to Nurse Summary of Call: constipation turned to diarrhea, abd pain/discomfort x2wks ago, lost 7lbs. Initial call taken by: Karna Christmas,  January 28, 2010 8:57 AM  Follow-up for Phone Call        Patient c/o alternating bowel habits.  She has abdominal pain with constipation.  Patient is taking laxatives and imodium.  I have advised her to avoid doing this.  She has started on a high fiber diet and that has seemed to help.  Patient will come in and see Dr [name] on 02/12/10 2:00 Follow-up by: Darcey Nora RN, CGRN,  January 28, 2010 10:03 AM

## 2010-05-18 NOTE — Assessment & Plan Note (Signed)
Summary: heme positive stool/regina    History of Present Illness Visit Type: Follow-up Visit Primary GI MD: [name] MD Primary Provider: [name], DO Requesting Provider: [name], DO Chief Complaint: Hem positive stools, diarrhea, family hx colon cancer History of Present Illness:   This is a 77 year old white female with a history of a duodenal ulcer and duodenal stricture who now has Hemoccult-positive stools on a home test as well as on physical exam. She denies black stools or abdominal pain. She was diagnosed with peptic ulcer disease in the 1990s and subsequently had an active duodenal ulcer in 2006 and again 2009. She also had a GI bleed in 2006. She has been on proton pump inhibitors. She takes Aleve for joint pains. Her last colonoscopy in 2009 showed mild diverticulosis of the left colon. There is a family history of colon cancer in a direct family member. Prior colonoscopies were in 2002 and 1996. Her last upper endoscopy in February 2009 showed a 5 cm hiatal hernia, small duodenal ulcer and mild esophageal stricture.   GI Review of Systems    Reports nausea.      Denies abdominal pain, acid reflux, belching, bloating, chest pain, dysphagia with liquids, dysphagia with solids, heartburn, loss of appetite, vomiting, vomiting blood, weight loss, and  weight gain.      Reports diarrhea, heme positive stool, and  hemorrhoids.     Denies anal fissure, black tarry stools, change in bowel habit, constipation, diverticulosis, fecal incontinence, irritable bowel syndrome, jaundice, light color stool, liver problems, rectal bleeding, and  rectal pain.    Current Medications (verified): 1)  Ativan 2 Mg Tabs (Lorazepam) .... 1/2 By Mouth At Bedtime 2)  Lexapro 20 Mg Tabs (Escitalopram Oxalate) .[name].. 1 By Mouth Once Daily 3)  Crestor 20 Mg Tabs (Rosuvastatin Calcium) .... Take One Tablet Each Evening At Bedtime- 4)  Nexium 40 Mg Cpdr (Esomeprazole Magnesium) .[name].. 1 By Mouth Once  Daily 5)  Micardis Hct 40-12.5 Mg  Tabs (Telmisartan-Hctz) .[name].. 1 By Mouth Qd 6)  Lumigan 0.03 %  Soln (Bimatoprost) .... Daily As Directed 7)  Vitamin B-12 1000 Mcg Tabs (Cyanocobalamin) .... Take 1 Tab Once Daily 8)  Aleve 220 Mg Tabs (Naproxen Sodium) 9)  Aspir-Low 81 Mg Tbec (Aspirin) .[name].. 1 By Mouth Once Daily 10)  Advair Diskus 250-50 Mcg/dose Aepb (Fluticasone-Salmeterol) .[name].. 1 Inh Two Times A Day 11)  Ventolin Hfa 108 (90 Base) Mcg/act Aers (Albuterol Sulfate) .... 2 Puffs Qid As Needed 12)  Vitamin D3 2000 Unit Caps (Cholecalciferol) .[name].. 1 By Mouth Once Daily 13)  Levsin/sl 0.125 Mg Subl (Hyoscyamine Sulfate) .[name].. 1-2 Sl Every 4 Hours As Needed Diarrhea 14)  Bc Headache 325-16-95 Mg Tabs (Aspirin-Salicylamide-Caffeine) .... As Needed  Allergies (verified): 1)  ! Morphine 2)  ! Demerol  Past History:  Past Medical History: Reviewed history from 02/02/2010 and no changes required. Hypertension RIGHT BREAST CANCER s/p tamoxifen 13 years ago Osteoporosis COPD Hyperlipidemia Anxiety Depression Peptic ulcer disease hiatal hernia  Past Surgical History: Reviewed history from 02/02/2010 and no changes required. RECONSTRUCT RIGHT BREAST(2000) Cataract extraction (08/2006) Cataract extraction (10/2006) Total Left knee replacement (01/11/2007)---Dr [facility] Hysterectomy Hemorrhoidectomy Tonsillectomy  Family History: Reviewed history from 02/02/2010 and no changes required. Family History of CAD Female 1st degree relative 2 sisters in 54s Family History of CAD Female 1st degree relative 2 brothers in 30s Family History Diabetes 1st degree relative Family History of Colon CA 1st degree relative at 75 yo-- father Family History Hypertension  Family History of Stroke M 1st degree relative at 36-- mother Family History of Crohn's: Brother Family History of Breast Cancer: Aunt Family History of Heart Disease: 2 Brothers, 1 Sister Family History of Cirrhosis: Brother  Social  History: Reviewed history from 02/02/2010 and no changes required. 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 Alcohol use-no Drug use-no Regular exercise-no  Review of Systems       The patient complains of arthritis/joint pain, back pain, depression-new, and headaches-new.  The patient denies allergy/sinus, anemia, anxiety-new, blood in urine, breast changes/lumps, change in vision, confusion, cough, coughing up blood, fainting, fatigue, fever, hearing problems, heart murmur, heart rhythm changes, itching, menstrual pain, muscle pains/cramps, night sweats, nosebleeds, pregnancy symptoms, shortness of breath, skin rash, sleeping problems, sore throat, swelling of feet/legs, swollen lymph glands, thirst - excessive , urination - excessive , urination changes/pain, urine leakage, vision changes, and voice change.         Pertinent positive and negative review of systems were noted in the above HPI. All other ROS was otherwise negative.   Vital Signs:  Patient profile:   77 year old female Height:      65.25 inches Weight:      179.38 pounds BMI:     29.73 Pulse rate:   76 / minute Pulse rhythm:   regular BP sitting:   102 / 68  (left arm) Cuff size:   regular  Vitals Entered By: June McMurray CMA Duncan Dull) (February 03, 2010 9:20 AM)  Physical Exam  General:  Well developed, well nourished, no acute distress. Mouth:  No deformity or lesions, dentition normal. Lungs:  Clear throughout to auscultation. Heart:  Regular rate and rhythm; no murmurs, rubs,  or bruits. Abdomen:  Soft, nontender and nondistended. No masses, hepatosplenomegaly or hernias noted. Normal bowel sounds. Rectal:  soft Hemoccult positive stool. Extremities:  No clubbing, cyanosis, edema or deformities noted. Skin:  Intact without significant lesions or rashes. Psych:  Alert and cooperative. Normal mood and affect.   Impression & Recommendations:  Problem # 1:  Hx of ESOPHAGEAL STRICTURE (ICD-530.3) Patient  has hemoccult-positive stool and a history of peptic ulcer disease with esophageal stricture and duodenal ulcer diagnosed on prior endoscopies. She is to continue taking Nexium 40 mg a day and we will schedule an upper endoscopy at her earliest convenience. I asked her to discontinue Aleve.  Problem # 2:  COLITIS (ICD-558.9) Patient had a prior colonoscopy in 2002 showing questionable colitis in the right colon but this was not confirmed on her most recent colonoscopy. If her upper endoscopy is negative, we may consider repeating her colonoscopy.  Other Orders: EGD (EGD)  Patient Instructions: 1)  You have been scheduled for an endoscopy on 02/15/10 @ 3:30 pm.  2)  You have been given samples of Nexium 40 mg to take once daily. 3)  Copy sent to : [name], MD 4)  The medication list was reviewed and reconciled.  All changed / newly prescribed medications were explained.  A complete medication list was provided to the patient / caregiver.

## 2010-05-18 NOTE — Procedures (Signed)
Summary: Instructions for procedure/[place]  Instructions for procedure/[place]   Imported By: Sherian Rein 03/04/2010 14:08:33  _____________________________________________________________________  External Attachment:    Type:   Image     Comment:   External Document

## 2010-05-18 NOTE — Assessment & Plan Note (Signed)
Summary: per dr [name]    History of Present Illness Visit Type: Follow-up Visit Primary GI MD: [name] MD Primary Emmaclaire Switala: [name], DO Requesting Issabelle Mcraney: na Chief Complaint: F/u from small bowel series. Pt states having diarrhea for four weeks and denies any other GI complaints  History of Present Illness:   This is a 77 female with a new diagnosis of peptic ulcer disease and a pyloric ulcer.. She had a partial gastric outlet obstruction on upper endoscopy yesterday. An upper GI series today showed free passage of the barium through the pylorus into the duodenum. She has a history of peptic ulcer disease  and has been on chronic proton pump inhibitors. Her gastric  biopsies  did not show any Barrett's esophagus but showed inflamed mucosa  at the pyloric area without H. pylori. Her diarrhea has improved  on Levsin 0.125 mg q.4 hours p.r.n.   GI Review of Systems      Denies abdominal pain, acid reflux, belching, bloating, chest pain, dysphagia with liquids, dysphagia with solids, heartburn, loss of appetite, nausea, vomiting, vomiting blood, weight loss, and  weight gain.      Reports diarrhea.     Denies anal fissure, black tarry stools, change in bowel habit, constipation, diverticulosis, fecal incontinence, heme positive stool, hemorrhoids, irritable bowel syndrome, jaundice, light color stool, liver problems, rectal bleeding, and  rectal pain.    Current Medications (verified): 1)  Ativan 2 Mg Tabs (Lorazepam) .... 1/2 By Mouth At Bedtime 2)  Lexapro 20 Mg Tabs (Escitalopram Oxalate) .[name].. 1 By Mouth Once Daily 3)  Crestor 20 Mg Tabs (Rosuvastatin Calcium) .... Take One Tablet Each Evening At Bedtime- 4)  Nexium 40 Mg Cpdr (Esomeprazole Magnesium) .[name].. 1 By Mouth Once Daily 5)  Micardis Hct 40-12.5 Mg  Tabs (Telmisartan-Hctz) .[name].. 1 By Mouth Qd 6)  Lumigan 0.03 %  Soln (Bimatoprost) .... Daily As Directed 7)  Vitamin B-12 1000 Mcg Tabs (Cyanocobalamin) .... Take 1 Tab Once  Daily 8)  Aleve 220 Mg Tabs (Naproxen Sodium) 9)  Aspir-Low 81 Mg Tbec (Aspirin) .[name].. 1 By Mouth Once Daily 10)  Advair Diskus 250-50 Mcg/dose Aepb (Fluticasone-Salmeterol) .[name].. 1 Inh Two Times A Day 11)  Ventolin Hfa 108 (90 Base) Mcg/act Aers (Albuterol Sulfate) .... 2 Puffs Qid As Needed 12)  Vitamin D3 2000 Unit Caps (Cholecalciferol) .[name].. 1 By Mouth Once Daily 13)  Levsin/sl 0.125 Mg Subl (Hyoscyamine Sulfate) .[name].. 1-2 Sl Every 4 Hours As Needed Diarrhea 14)  Bc Headache 325-16-95 Mg Tabs (Aspirin-Salicylamide-Caffeine) .... As Needed  Allergies (verified): 1)  ! Morphine 2)  ! Demerol  Past History:  Past Medical History: Reviewed history from 02/02/2010 and no changes required. Hypertension RIGHT BREAST CANCER s/p tamoxifen 13 years ago Osteoporosis COPD Hyperlipidemia Anxiety Depression Peptic ulcer disease hiatal hernia  Past Surgical History: Reviewed history from 02/02/2010 and no changes required. RECONSTRUCT RIGHT BREAST(2000) Cataract extraction (08/2006) Cataract extraction (10/2006) Total Left knee replacement (01/11/2007)---Dr [facility] Hysterectomy Hemorrhoidectomy Tonsillectomy  Family History: Reviewed history from 02/02/2010 and no changes required. Family History of CAD Female 1st degree relative 2 sisters in 52s Family History of CAD Female 1st degree relative 2 brothers in 75s Family History Diabetes 1st degree relative Family History of Colon CA 1st degree relative at 44 yo-- father Family History Hypertension Family History of Stroke M 1st degree relative at 78-- mother Family History of Crohn's: Brother Family History of Breast Cancer: Aunt Family History of Heart Disease: 2 Brothers, 1 Sister Family History  of Cirrhosis: Brother  Social History: Reviewed history from 02/02/2010 and no changes required. 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 Alcohol use-no Drug use-no Regular exercise-no  Review of Systems       The patient  complains of fatigue.  The patient denies allergy/sinus, anemia, anxiety-new, arthritis/joint pain, back pain, blood in urine, breast changes/lumps, change in vision, confusion, cough, coughing up blood, depression-new, fainting, fever, headaches-new, hearing problems, heart murmur, heart rhythm changes, itching, menstrual pain, muscle pains/cramps, night sweats, nosebleeds, pregnancy symptoms, shortness of breath, skin rash, sleeping problems, sore throat, swelling of feet/legs, swollen lymph glands, thirst - excessive , urination - excessive , urination changes/pain, urine leakage, vision changes, and voice change.         Pertinent positive and negative review of systems were noted in the above HPI. All other ROS was otherwise negative.   Vital Signs:  Patient profile:   77 year old female Height:      65.25 inches Weight:      175 pounds BMI:     29.00 BSA:     1.88 Pulse rate:   80 / minute Pulse rhythm:   regular BP sitting:   120 / 68  (left arm) Cuff size:   regular  Vitals Entered By: Ok Anis CMA (February 12, 2010 2:58 PM)  Physical Exam  General:  appears depressed, says that she doesn't feel good. Eyes:  nonicteric. Mouth:  normal mucosa. Lungs:  Clear throughout to auscultation. Heart:  Regular rate and rhythm; no murmurs, rubs,  or bruits. Abdomen:  soft abdomen with tenderness in mid epigastrium. No distention. Normoactive bowel sounds. Liver edge at costal margin. Extremities:  No clubbing, cyanosis, edema or deformities noted. Skin:  Intact without significant lesions or rashes. Psych:  Alert and cooperative. Normal mood and affect.   Impression & Recommendations:  Problem # 1:  GASTRIC OUTLET OBSTRUCTION (ICD-537.0) Endoscopically, there is narrowing at the pyloric channel and the scope could not pass through. An upper GI today showed free passage of the barium through the pylorus. She will continue on Nexium 40 mg daily and add Carafate 1 g twice a day. I have  discussed with her a low residue diet, small frequent feedings. She is to avoid high roughage foods. If her abd. pain continues consider GES to assess gastric emptying.  Problem # 2:  COLITIS (ICD-558.9) Her diarrhea is likely due to irritable bowel syndrome. She will continue on Levsin 0.125 mg twice a day.  Patient Instructions: 1)  Nexium 40 mg daily. 2)  Low-residue diet, gastroparesis diet. 3)  Carafate 1 g p.o. b.i.d. 4)  Senokot 125 mg q.4 hours p.r.n. diarrhea. 5)  Office visit in 6-8 weeks. 6)  Copy sent to : Dr Laury Axon 7)  The medication list was reviewed and reconciled.  All changed / newly prescribed medications were explained.  A complete medication list was provided to the patient / caregiver. Prescriptions: NEXIUM 40 MG CPDR (ESOMEPRAZOLE MAGNESIUM) 1 by mouth once daily  #30 x 1   Entered by:   Lamona Curl CMA (AAMA)   Authorized by:   Hart Carwin MD   Signed by:   Lamona Curl CMA (AAMA) on 02/12/2010   Method used:   Electronically to        [id number] (retail)       [address] [place], [place]  [address]       Ph: [phone]  Fax: [phone]   RxID:   [id number] CARAFATE 1 GM TABS (SUCRALFATE) Take 1 tablet by mouth two times a day  #60 x 1   Entered by:   Lamona Curl CMA (AAMA)   Authorized by:   Hart Carwin MD   Signed by:   Lamona Curl CMA (AAMA) on 02/12/2010   Method used:   Electronically to        [facility] [address] [id number]* (retail)       [address] [place], [place]  [address]       Ph: [phone]       Fax: [phone]   RxID:   [id number]

## 2010-05-18 NOTE — Discharge Summary (Signed)
Summary: GI Bleed  NAMETiffinie, [name]                  ACCOUNT NO.:  [id number]   MEDICAL RECORD NO.:  [id number]          PATIENT TYPE:  INP   LOCATION:  3703                         FACILITY:  [facility]   PHYSICIAN:  Rene Paci, M.D. LHCDATE OF BIRTH:  1930-10-21   DATE OF ADMISSION:  10/06/2004  DATE OF DISCHARGE:  10/08/2004                                 DISCHARGE SUMMARY   DISCHARGE DIAGNOSES:  1.  Acute upper gastrointestinal bleed secondary to chronic NSAID-induced      peptic ulcer and erosions.  2.  Anemia with acute blood loss secondary to #1.  3.  Gastroesophageal reflux disease.   HISTORY OF PRESENT ILLNESS:  The patient is a 77 year old female who was  admitted with a history of diarrhea x24 hours and also bright red blood per  rectum.  The patient denied nausea, vomiting, or fever at time of admission.  The patient reported positive weakness and diaphoresis on the day of  admission.  The patient was admitted for further evaluation.   PAST MEDICAL HISTORY:  1.  Rheumatic fever x3 as a child.  2.  Depression.  3.  Hypertension.  4.  Osteoarthritis.  5.  Anxiety.  6.  Gastroesophageal reflux disease.  7.  Status post hysterectomy at age 71.  60.  Status post right mastectomy 1995 with reconstruction in 2000 for breast      cancer.  The patient is status post five years Tamoxifen.  9.  Tonsillectomy as a child.  10. Multiple knee steroid injections.  11. Hemorrhoidectomy in 1971.   HOSPITAL COURSE:  The patient was admitted for GI bleed.  A GI consult was  obtained.  The patient underwent an endoscopy performed by Dr. Russella Dar.  Endoscopy revealed a duodenal ulcer which was acute without hemorrhage.  Next, duodenal stricture was noted.  Hernia, positive hiatal hernia,  esophageal stricture, and gastritis was noted on EGD report.  The patient  was given two units of packed red blood cells secondary to anemia.  At the  time of discharge, the patient's hemoglobin is  10.4.  Yesterday, hemoglobin  was 10.2.  Hemoglobin remained stable.   The patient is to follow up with Dr. [name] at [facility] next  week on June 30 for an EGD with esophageal dilation.   DISCHARGE MEDICATIONS:  1.  Potassium.  2.  Lexapro.  3.  Hydrochlorothiazide.  4.  Micardis.  5.  Lorazepam.   The patient is to resume preadmission home dosing of all above meds.  In  addition, the patient is not to take Motrin or any aspirin products.   DISCHARGE LABORATORIES:  Hemoglobin 10.4, hematocrit 30.4.   FOLLOW UP:  The patient is to follow up with Dr. [name] October 15, 2004.  In  addition, the patient has a followup with Dr. Laury Axon on June 29 at 1:45  p.m.  for followup CBC and post hospitalization visit.       MSO/MEDQ  D:  10/08/2004  T:  10/08/2004  Job:  [id number]   cc:   Dr. Laury Axon

## 2010-05-18 NOTE — Letter (Signed)
Summary: [facility]   Imported By: Lanelle Bal 10/15/2009 15:23:34  _____________________________________________________________________  External Attachment:    Type:   Image     Comment:   External Document  Appended Document: [facility] neg for alpha 1 antitrypsin  Appended Document: [facility] DISCUSS WITH PATIENT

## 2010-05-18 NOTE — Progress Notes (Signed)
Summary: [facility] 7-20-ECHO results  Phone Note Outgoing Call Call back at [facility] Phone [phone]   Details for Reason: mild diastolic dysfunction-- other wise no change refer to cardiology if symptoms persist Summary of Call: Called pt to inform pt on Bone density result. pt states that she has yet to receive result from ECHO. pls advise on results chart placed on ledge for review................Marland KitchenFelecia Deloach CMA  November 04, 2009 8:09 AM   Follow-up for Phone Call        see echo Follow-up by: [name] DO,  November 04, 2009 8:30 AM  Additional Follow-up for Phone Call Additional follow up Details #1::        left message to call office.Felecia Deloach CMA  November 04, 2009 9:48 AM   patient aware of results.....Marland KitchenMarland KitchenDoristine Devoid CMA  November 04, 2009 4:21 PM

## 2010-05-18 NOTE — Letter (Signed)
Summary: [facility] Instructions  [facility]  [address] [place], [place] [address]   Phone: [phone]  Fax: [phone]       [name]    1931/01/09    MRN: [id number]       Procedure Day /Date: 03-03-10     Arrival Time: 9:00 Am      Procedure Time: 10:00 AM     Location of Procedure:                     X      [facility] ( Outpatient Registration)     PREPARATION FOR COLONOSCOPY WITH MIRALAX  On Monday 03-01-10 do not eat nuts, seeds, popcorn, corn, beans, peas,  salads, or any raw vegetables.  Do not take any fiber supplements (e.g. Metamucil, Citrucel, and Benefiber). ____________________________________________________________________________________________________   THE DAY BEFORE YOUR PROCEDURE         DATE: 03-02-10  DAY: Tuesday  1   Drink clear liquids the entire day-NO SOLID FOOD  2   Do not drink anything colored red or purple.  Avoid juices with pulp.  No orange juice.  3   Drink at least 64 oz. (8 glasses) of fluid/clear liquids during the day to prevent dehydration and help the prep work efficiently.  CLEAR LIQUIDS INCLUDE: Water Jello Ice Popsicles Tea (sugar ok, no milk/cream) Powdered fruit flavored drinks Coffee (sugar ok, no milk/cream) Gatorade Juice: apple, white grape, white cranberry  Lemonade Clear bullion, consomm, broth Carbonated beverages (any kind) Strained chicken noodle soup Hard Candy  4   Mix the entire bottle of Miralax with 64 oz. of Gatorade/Powerade in the morning and put in the refrigerator to chill.  5   At 3:00 pm take 2 Dulcolax/Bisacodyl tablets.  6   At 4:30 pm take one Reglan/Metoclopramide tablet.  7  Starting at 5:00 pm drink one 8 oz glass of the Miralax mixture every 15-20 minutes until you have finished drinking the entire 64 oz.  You should finish drinking prep around 7:30 or 8:00 pm.  8   If you are nauseated, you may take the 2nd Reglan/Metoclopramide tablet at 6:30 pm.     9    At 8:00 pm take 2 more DULCOLAX/Bisacodyl tablets.     THE DAY OF YOUR PROCEDURE      DATE: 03-03-10 DAY: Wednesday  You may drink clear liquids until 8:00 AM  (2 HOURS BEFORE PROCEDURE).   MEDICATION INSTRUCTIONS  Unless otherwise instructed, you should take regular prescription medications with a small sip of water as early as possible the morning of your procedure.      OTHER INSTRUCTIONS  You will need a responsible adult at least 75 years of age to accompany you and drive you home.   This person must remain in the waiting room during your procedure.  Wear loose fitting clothing that is easily removed.  Leave jewelry and other valuables at home.  However, you may wish to bring a book to read or an iPod/MP3 player to listen to music as you wait for your procedure to start.  Remove all body piercing jewelry and leave at home.  Total time from sign-in until discharge is approximately 2-3 hours.  You should go home directly after your procedure and rest.  You can resume normal activities the day after your procedure.  The day of your procedure you should not:   Drive   Make legal decisions   Operate machinery  Drink alcohol   Return to work  You will receive specific instructions about eating, activities and medications before you leave.   The above instructions have been reviewed and explained to me by   _______________________    I fully understand and can verbalize these instructions _____________________________ Date _______  Appended Document: Miralax Instructions Spoke to [facility] and faxed the notes , fax # [phone], for Dr. Lina Gray for pt [name], DOB 1930/11/23 . Appt is Wed 03-03-10. Colonoscopy. Pt saw [name] ACNP-BC int he office on Mon 03-01-10.

## 2010-05-18 NOTE — Progress Notes (Signed)
Summary: Call form Dr.Weston (Dentist)  Phone Note From Other Clinic Call back at [phone]   Caller: Dr.Weston-Dentist Summary of Call: Dr.Weston called and requested Dr.Lowne or her assistant call to discuss if patient is ok to have an extraction, patient was unable to tell them about her health history.   Shonna Chock CMA  November 25, 2009 10:22 AM   Follow-up for Phone Call        pt can have extraction---  give him her hx hyperlipidemia, htn and anxiety--- she is on meds to treat and is also taking daily aspirin which she can stop before if necessary Follow-up by: [name] DO,  November 25, 2009 10:25 AM  Additional Follow-up for Phone Call Additional follow up Details #1::        PAIGE INFORMED OF DR LOWNE SUGGESTION.............Marland KitchenFelecia Deloach CMA  November 25, 2009 12:06 PM

## 2010-05-18 NOTE — Letter (Signed)
Summary: EGD Instructions  [facility]  [address] [place], [place] [address]   Phone: [phone]  Fax: [phone]       [name]    March 26, 1931    MRN: [id number]       Procedure Day /Date: 02/15/10 Monday     Arrival Time: 2:30 pm     Procedure Time: 3:30 pm     Location of Procedure:                    _x  _ [facility] (4th Floor)  PREPARATION FOR ENDOSCOPY   On 10/31/11THE DAY OF THE PROCEDURE:  1.   No solid foods, milk or milk products are allowed after midnight the night before your procedure.  2.   Do not drink anything colored red or purple.  Avoid juices with pulp.  No orange juice.  3.  You may drink clear liquids until 1:30 pm, which is 2 hours before your procedure.                                                                                                CLEAR LIQUIDS INCLUDE: Water Jello Ice Popsicles Tea (sugar ok, no milk/cream) Powdered fruit flavored drinks Coffee (sugar ok, no milk/cream) Gatorade Juice: apple, white grape, white cranberry  Lemonade Clear bullion, consomm, broth Carbonated beverages (any kind) Strained chicken noodle soup Hard Candy   MEDICATION INSTRUCTIONS  Unless otherwise instructed, you should take regular prescription medications with a small sip of water as early as possible the morning of your procedure.                      OTHER INSTRUCTIONS  You will need a responsible adult at least 75 years of age to accompany you and drive you home.   This person must remain in the waiting room during your procedure.  Wear loose fitting clothing that is easily removed.  Leave jewelry and other valuables at home.  However, you may wish to bring a book to read or an iPod/MP3 player to listen to music as you wait for your procedure to start.  Remove all body piercing jewelry and leave at home.  Total time from sign-in until discharge is approximately 2-3 hours.  You  should go home directly after your procedure and rest.  You can resume normal activities the day after your procedure.  The day of your procedure you should not:   Drive   Make legal decisions   Operate machinery   Drink alcohol   Return to work  You will receive specific instructions about eating, activities and medications before you leave.    The above instructions have been reviewed and explained to me by   Lamona Curl CMA Duncan Dull)  February 03, 2010 10:32 AM     I fully understand and can verbalize these instructions _____________________________ Date 02/03/10

## 2010-05-18 NOTE — Procedures (Signed)
Summary: Upper Endoscopy  Patient: [name] Note: All result statuses are Final unless otherwise noted.  Tests: (1) Upper Endoscopy (EGD)   EGD Upper Endoscopy       DONE     [facility]     [address]     [place], [place]  [address]           ENDOSCOPY PROCEDURE REPORT           PATIENT:  Ashley, Gray  MR#:  [id number]     BIRTHDATE:  October 09, 1930, 79 yrs. old  GENDER:  female           ENDOSCOPIST:  [name]. [name], MD     Referred by:  [name], DO           PROCEDURE DATE:  02/10/2010     PROCEDURE:  EGD with biopsy, [address]39, EGD with dilatation over     guidewire     ASA CLASS:  Class II     INDICATIONS:  hemoccult positive stool last EGD 2009- duodenal     ulcer, with partial outlet obstruction           MEDICATIONS:   Versed 10 mg, Fentanyl 100 mcg     TOPICAL ANESTHETIC:  Exactacain Spray           DESCRIPTION OF PROCEDURE:   After the risks benefits and     alternatives of the procedure were thoroughly explained, informed     consent was obtained.  The LB GIF-H180 [id number] endoscope was     introduced through the mouth and advanced to the stomach antrum,     limited by an obstruction.   The instrument was slowly withdrawn     as the mucosa was fully examined.     <<PROCEDUREIMAGES>>           Inflammation was found at the pylorus. With standard forceps, a     biopsy was obtained and sent to pathology.  An ulcer was found at     the pylorus (see image4, image3, image5, image6, and image7).     semicircular superficial ulcer at the pylorus opening with     surrounding edema, partially obstructing the outlet  A stricture     was found in the distal esophagus (see image2). Savary dilation     over a guidewire 16 mm dilator paseesd with small amount of blood     A hiatal hernia was found (see image1, image8, and image9). 4 cm HH     35-39 cm, nonreducible    Retroflexed views revealed no     abnormalities.    The scope was then withdrawn from the patient     and the procedure completed.           COMPLICATIONS:  None           ENDOSCOPIC IMPRESSION:     1) Inflammation at the pylorus     2) Ulcer at the pylorus     3) Stricture in the distal esophagus     4) Hiatal hernia     partial gastric outlet obstruction due to PUD     s/p Savary dil of es.stricture     RECOMMENDATIONS:     small frequent feedings     UGI series     Nexiem 40 mg po qd,           REPEAT EXAM:  In 0 year(s) for.  ______________________________     [name]. [name], MD           CC:           n.     eSIGNED:   [name]. Brodie at 02/10/2010 11:47 AM           Page 2 of 3   Ashley, Gray, [id number]  Note: An exclamation mark (!) indicates a result that was not dispersed into the flowsheet. Document Creation Date: 02/10/2010 11:47 AM _______________________________________________________________________  (1) Order result status: Final Collection or observation date-time: 02/10/2010 11:31 Requested date-time:  Receipt date-time:  Reported date-time:  Referring Physician:   Ordering Physician: [name] [phone]) Specimen Source:  Source: Launa Grill Order Number: [id number] Lab site:

## 2010-05-18 NOTE — Assessment & Plan Note (Signed)
Summary: diarrhea/regina    History of Present Illness Visit Type: follow up Primary GI MD: [name] MD Primary Provider: [name], DO Requesting Provider: na Chief Complaint: Chronic diarrhea x 8 weeks. Pt has watery loose diarrhea with a BM every hour. Pt has a lot of nausea but pt does have a appetitie. Pt had one episode last week with two days that diarrhea stopped but restarted again on Thursday morning. Pt has been taking Levsin without any improvement.  History of Present Illness:   Patient followed by Dr. [name] for [facility], history of esophageal stricture and history of PUD. Recent EGD done for heme positive stools revealed esophageal stricture and partial gastric outlet obstruction secondary to PUD   Patient presents with two month history of non-bloody diarrhea. No antibiotics in a year.  Stool culture and one C-Diff obtained by Dr. Laury Axon was negative. Taking Hyoscyamine prescribed by Dr. Hulan Saas. She isn't having any abdominal pain but diarrhea is debilitating. It is mainly postprandial but having some nocturnal stools as well. No recent medication or dietary changes. Not taking Immodium or other anti-diarrheals. She is weak and losing weight loss.   GI Review of Systems    Reports nausea and  weight loss.   Weight loss of 6 pounds over 2 weeks.   Denies abdominal pain, acid reflux, belching, bloating, chest pain, dysphagia with liquids, dysphagia with solids, heartburn, loss of appetite, vomiting, vomiting blood, and  weight gain.      Reports diarrhea.     Denies anal fissure, black tarry stools, change in bowel habit, constipation, diverticulosis, fecal incontinence, heme positive stool, hemorrhoids, irritable bowel syndrome, jaundice, light color stool, liver problems, rectal bleeding, and  rectal pain.    Current Medications (verified): 1)  Ativan 2 Mg Tabs (Lorazepam) .... 1/2 By Mouth At Bedtime 2)  Lexapro 20 Mg Tabs (Escitalopram Oxalate) .[name].. 1 By Mouth Once  Daily 3)  Crestor 20 Mg Tabs (Rosuvastatin Calcium) .... Take One Tablet Each Evening At Bedtime- 4)  Nexium 40 Mg Cpdr (Esomeprazole Magnesium) .[name].. 1 By Mouth Once Daily 5)  Micardis Hct 40-12.5 Mg  Tabs (Telmisartan-Hctz) .[name].. 1 By Mouth Qd 6)  Lumigan 0.03 %  Soln (Bimatoprost) .... Daily As Directed 7)  Vitamin B-12 1000 Mcg Tabs (Cyanocobalamin) .... Take 1 Tab Once Daily 8)  Aspir-Low 81 Mg Tbec (Aspirin) .[name].. 1 By Mouth Once Daily 9)  Advair Diskus 250-50 Mcg/dose Aepb (Fluticasone-Salmeterol) .[name].. 1 Inh Two Times A Day 10)  Ventolin Hfa 108 (90 Base) Mcg/act Aers (Albuterol Sulfate) .... 2 Puffs Qid As Needed 11)  Vitamin D3 2000 Unit Caps (Cholecalciferol) .[name].. 1 By Mouth Once Daily 12)  Levsin/sl 0.125 Mg Subl (Hyoscyamine Sulfate) .[name].. 1-2 Sl Every 4 Hours As Needed Diarrhea 13)  Bc Headache 325-16-95 Mg Tabs (Aspirin-Salicylamide-Caffeine) .... As Needed 14)  Carafate 1 Gm Tabs (Sucralfate) .... Take 1 Tablet By Mouth Two Times A Day 15)  Hydrocortisone Acetate 25 Mg Supp (Hydrocortisone Acetate) .... Use 1 Suppository At Bedtime 16)  Miralax   Powd (Polyethylene Glycol 3350) .... As Per Prep  Instructions. 17)  Dulcolax 5 Mg  Tbec (Bisacodyl) .... Day Before Procedure Take 2 At 3pm and 2 At 8pm. 18)  Reglan 10 Mg  Tabs (Metoclopramide Hcl) .... As Per Prep Instructions.  Allergies (verified): 1)  ! Morphine 2)  ! Demerol  Past History:  Past Medical History: Reviewed history from 02/02/2010 and no changes required. Hypertension RIGHT BREAST CANCER s/p tamoxifen 13  years ago Osteoporosis COPD Hyperlipidemia Anxiety Depression Peptic ulcer disease hiatal hernia  Past Surgical History: Reviewed history from 02/02/2010 and no changes required. RECONSTRUCT RIGHT BREAST(2000) Cataract extraction (08/2006) Cataract extraction (10/2006) Total Left knee replacement (01/11/2007)---Dr [facility] Hysterectomy Hemorrhoidectomy Tonsillectomy  Family History: Reviewed history  from 02/02/2010 and no changes required. Family History of CAD Female 1st degree relative 2 sisters in 1s Family History of CAD Female 1st degree relative 2 brothers in 97s Family History Diabetes 1st degree relative Family History of Colon CA 1st degree relative at 29 yo-- father Family History Hypertension Family History of Stroke M 1st degree relative at 45-- mother Family History of Crohn's: Brother Family History of Breast Cancer: Aunt Family History of Heart Disease: 2 Brothers, 1 Sister Family History of Cirrhosis: Brother  Social History: Reviewed history from 02/02/2010 and no changes required. 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 Alcohol use-no Drug use-no Regular exercise-no  Review of Systems       The patient complains of fatigue, fever, and shortness of breath.  The patient denies allergy/sinus, anemia, anxiety-new, arthritis/joint pain, back pain, blood in urine, breast changes/lumps, change in vision, confusion, cough, coughing up blood, depression-new, fainting, headaches-new, hearing problems, heart murmur, heart rhythm changes, itching, menstrual pain, muscle pains/cramps, night sweats, nosebleeds, pregnancy symptoms, skin rash, sleeping problems, sore throat, swelling of feet/legs, swollen lymph glands, thirst - excessive , urination - excessive , urination changes/pain, urine leakage, vision changes, and voice change.    Vital Signs:  Patient profile:   77 year old female Height:      65.25 inches Weight:      169.25 pounds BMI:     28.05 Temp:     98.5 degrees F oral Pulse rate:   72 / minute Pulse rhythm:   regular BP sitting:   112 / 64  (left arm) Cuff size:   regular  Vitals Entered By: Christie Nottingham CMA Duncan Dull) (March 01, 2010 1:34 PM)  Physical Exam  General:  Well developed, well nourished, no acute distress. Head:  Normocephalic and atraumatic. Eyes:  Conjunctiva pink, no icterus.  Neck:  no obvious masses  Lungs:  Clear  throughout to auscultation. Heart:  Regular rate and rhythm; no murmurs, rubs,  or bruits. Abdomen:  Abdomen soft, nontender, nondistended. No obvious masses or hepatomegaly.Normal bowel sounds.  Rectal:  A few small external hemorrhoids. No internal masses on DRE but small aount bloody fluid extracted. Some small internal hemorrhoids on anoscopy  Msk:  Symmetrical with no gross deformities. Normal posture. Extremities:  No palmar erythema, no edema.  Neurologic:  Alert and  oriented x4;  grossly normal neurologically. Skin:  Intact without significant lesions or rashes. Cervical Nodes:  No significant cervical adenopathy. Psych:  Alert and cooperative. Normal mood and affect.   Impression & Recommendations:  Problem # 1:  DIARRHEA (ICD-787.91) Assessment Deteriorated Two month history of watery, nonbloody stools, mostly postprandial but some nocturnal ones as well.  History of colitis on 1996 colonoscopy, findings not reproduced on 2002 colonoscopy. Patient's quality of life is being significantly impacted by the diarrhea. She is weak and losing weight. Rule out microscopc colitis, IBD. Could be severe IBS but her weight loss, blood on exam are concerning. The patient will be scheduled for a colonoscopy with biopsies/polypectomy (if indicated).  The risks and benefits of the procedure, as well as alternatives were discussed with the patient and she agrees to proceed. Trial of Lomotil after colonoscopy this week (depending on results).   Orders: ZCOL (  ZCOL)  Problem # 2:  HEMORRHOIDS-INTERNAL (ICD-455.0) Assessment: New Stools have not contained blood but small amount of bloody fluid seen on rectal exam which may be secondary ot hemorrhoids. Further evaluation at time of colonoscopy. Start steroid suppositories.         Problem # 3:  PEPTIC ULCER DISEASE (ICD-533.90) Assessment: Comment Only Partial gastric outlet obstruction on recent EGD done for heme positive stool. Inflammation /  ulcer at the pylorus. Clinically doing okay without nausea or vomiting.   Problem # 4:  Family Hx of COLON CANCER (ICD-153.9) Assessment: Comment Only  Patient Instructions: 1)  We have scheduled the Colonoscopy on 03-03-10 at [facility] with Dr. [name]. 2)  Directions and brochure provided. 3)  We sent the prescription for the Colonoscopy prep to  [facility].  4)  We also sent the prescription for Hydrocortisone HC Suppositories to your pharmacy. 5)  [name] ACNP wants you to start the Lomotil after the procedure on Wednesday. 6)  Copy sent to : Vinetta Bergamo, DO Prescriptions: REGLAN 10 MG  TABS (METOCLOPRAMIDE HCL) As per prep instructions.  #2 x 0   Entered by:   Lowry Ram NCMA   Authorized by:   [name] NP   Signed by:   Lowry Ram NCMA on 03/01/2010   Method used:   Electronically to        [facility] #[id number]* (retail)       [address] [place], [place]  [address]       Ph: [phone]       Fax: [phone]   RxID:   [id number] DULCOLAX 5 MG  TBEC (BISACODYL) Day before procedure take 2 at 3pm and 2 at 8pm.  #4 x 0   Entered by:   Lowry Ram NCMA   Authorized by:   [name] NP   Signed by:   Lowry Ram NCMA on 03/01/2010   Method used:   Electronically to        [facility] #[id number]* (retail)       [address] [place], [place]  [address]       Ph: [phone]       Fax: [phone]   RxID:   [id number] MIRALAX   POWD (POLYETHYLENE GLYCOL 3350) As per prep  instructions.  #255gm x 0   Entered by:   Lowry Ram NCMA   Authorized by:   [name] NP   Signed by:   Lowry Ram NCMA on 03/01/2010   Method used:   Electronically to        [facility] #[id number]* (retail)       [address] [place], [place]  [address]       Ph: [phone]       Fax: [phone]   RxID:   [id number] LOMOTIL 2.5-0.025 MG TABS (DIPHENOXYLATE-ATROPINE) Take 1 tab  twice daily as needed for diarrhea  #60 x 1   Entered by:   Lowry Ram NCMA   Authorized by:   [name] NP   Signed by:   Lowry Ram NCMA on 03/01/2010   Method used:   Printed then faxed to ...       [facility] #[id number]* (retail)       [address]       [place], [place]  [address]       Ph: [phone]       Fax: [phone]   RxID:   [id number] HYDROCORTISONE ACETATE 25 MG SUPP (HYDROCORTISONE ACETATE) Use 1 suppository at bedtime  #10 x 1   Entered by:   Lowry Ram NCMA   Authorized by:   [name] NP   Signed by:   Lowry Ram NCMA on 03/01/2010   Method used:   Electronically to        [facility] [address] #[id number]* (retail)       [address] [place], [place]  [address]       Ph: [phone]       Fax: [phone]   RxID:   [id number]

## 2010-05-18 NOTE — Assessment & Plan Note (Signed)
Summary: LOTS OF ISSUES--COUGH, TIRED, SOB, "LET HERSELF GO"--OK PER D...   Vital Signs:  Patient profile:   77 year old female Weight:      181 pounds O2 Sat:      96 % Temp:     98.6 degrees F oral Pulse rate:   88 / minute Pulse rhythm:   regular BP sitting:   120 / 80  (left arm) Cuff size:   large  Vitals Entered By: Army Fossa CMA (September 25, 2009 2:15 PM) CC: Pt here says that muscles and bones ache. Has a cough she cannot get rid of. Fatigued all the time.   History of Present Illness: Pt here c/o feeling bad---He's been SOB, fatigued and admits to still being depressed over her husbands death.  Her daughter also lost her license secondary to DWI.  Pt is also coughing--dry, no congestion. No heartburn, burping etc.    Current Medications (verified): 1)  Ativan 2 Mg Tabs (Lorazepam) .... 1/2 By Mouth At Bedtime 2)  Lexapro 20 Mg Tabs (Escitalopram Oxalate) .[name].. 1 By Mouth Once Daily 3)  Crestor 20 Mg Tabs (Rosuvastatin Calcium) .... Take One Tablet Each Evening At Bedtime-Labs Due Now 4)  Prilosec 40 Mg   (Omeprazole) .[name].. 1 By Mouth Once Daily 5)  Micardis Hct 40-12.5 Mg  Tabs (Telmisartan-Hctz) .[name].. 1 By Mouth Qd 6)  Lumigan 0.03 %  Soln (Bimatoprost) .... Daily As Directed 7)  Vitamin B-12 1000 Mcg Tabs (Cyanocobalamin) .... Take 1 Tab Once Daily 8)  Zostavax 16109 Unt/0.36ml Solr (Zoster Vaccine Live) .[name].. 1 Ml  Im  X1 9)  Nystatin  Powd (Nystatin) .... Apply Two Times A Day To Affected Area.  Disp 30 Grams 10)  Aleve 220 Mg Tabs (Naproxen Sodium) 11)  Aspir-Low 81 Mg Tbec (Aspirin) .[name].. 1 By Mouth Once Daily 12)  Advair Diskus 250-50 Mcg/dose Aepb (Fluticasone-Salmeterol) .[name].. 1 Inh Two Times A Day 13)  Ventolin Hfa 108 (90 Base) Mcg/act Aers (Albuterol Sulfate) .... 2 Puffs Qid As Needed  Allergies (verified): 1)  ! Morphine 2)  ! Demerol  Past History:  Past medical, surgical, family and social histories (including risk factors) reviewed for relevance to  current acute and chronic problems.  Past Medical History: Reviewed history from 06/07/2007 and no changes required. Hypertension RIGHT BREAST CANCER s/p tamoxifen 13 years ago Osteoporosis COPD Hyperlipidemia Anxiety Depression Peptic ulcer disease hiatal hernia  Past Surgical History: Reviewed history from 06/28/2007 and no changes required. RECONSTRUCT RIGHT BREAST(2000) Cataract extraction (08/2006) Cataract extraction (10/2006) Total knee replacement (01/11/2007)---Dr Charlann Boxer  Family History: Reviewed history from 12/19/2006 and no changes required. Family History of CAD Female 1st degree relative 2 sisters in 69s Family History of CAD Female 1st degree relative 2 brothers in 63s Family History Diabetes 1st degree relative Family History of Colon CA 1st degree relative at 38 yo-- father Family History Hypertension Family History of Stroke M 1st degree relative at 75-- mother  Social History: Reviewed history from 12/19/2006 and no changes required. Occupation:sales Married Never Smoked Alcohol use-no Drug use-no Regular exercise-no  Review of Systems      See HPI  Physical Exam  General:  Well-developed,well-nourished,in no acute distress; alert,appropriate and cooperative throughout examination Ears:  External ear exam shows no significant lesions or deformities.  Otoscopic examination reveals clear canals, tympanic membranes are intact bilaterally without bulging, retraction, inflammation or discharge. Hearing is grossly normal bilaterally. Nose:  External nasal examination shows no deformity or inflammation. Nasal mucosa are  pink and moist without lesions or exudates. Mouth:  Oral mucosa and oropharynx without lesions or exudates.  Teeth in good repair. Neck:  No deformities, masses, or tenderness noted. Lungs:  R wheezes and L wheezes.   Heart:  normal rate and no murmur.   Msk:  normal ROM, no joint tenderness, no joint swelling, no joint warmth, no redness over  joints, no joint deformities, no joint instability, and no crepitation.   Extremities:  No clubbing, cyanosis, edema, or deformity noted with normal full range of motion of all joints.   Neurologic:  alert & oriented X3, gait normal, and DTRs symmetrical and normal.   Skin:  Intact without suspicious lesions or rashes Cervical Nodes:  No lymphadenopathy noted Psych:  Oriented X3, depressed affect, and tearful.     Impression & Recommendations:  Problem # 1:  CHEST PAIN UNSPECIFIED (ICD-786.50)  Orders: T-2 View CXR (71020TC) Venipuncture (60454) T-Antinuclear Antib (ANA) (09811-91478) Echo Referral (Echo) EKG w/ Interpretation (93000)  Problem # 2:  SHORTNESS OF BREATH (ICD-786.05)  Orders: T-2 View CXR (71020TC) Venipuncture (29562) T-Antinuclear Antib (ANA) (13086-57846) Echo Referral (Echo) EKG w/ Interpretation (93000)  Problem # 3:  FATIGUE (ICD-780.79)  Orders: Venipuncture (96295) T-Antinuclear Antib (ANA) (28413-24401) EKG w/ Interpretation (93000)  Problem # 4:  COUGH (ICD-786.2)  Orders: Venipuncture (02725) T-Antinuclear Antib (ANA) (36644-03474)  Problem # 5:  DEPRESSION (ICD-311)  Her updated medication list for this problem includes:    Ativan 2 Mg Tabs (Lorazepam) .[name]... 1/2 by mouth at bedtime    Lexapro 20 Mg Tabs (Escitalopram oxalate) .[name]... 1 by mouth once daily  Orders: Venipuncture (25956) T-Antinuclear Antib (ANA) (38756-43329) EKG w/ Interpretation (93000)  Problem # 6:  HYPERLIPIDEMIA (ICD-272.4)  Her updated medication list for this problem includes:    Crestor 20 Mg Tabs (Rosuvastatin calcium) .[name]... Take one tablet each evening at bedtime-labs due now  Labs Reviewed: SGOT: 21 (12/12/2008)   SGPT: 12 (12/12/2008)   HDL:50.30 (12/12/2008), 43.3 (12/14/2007)  LDL:90 (12/12/2008), 110 (51/88/4166)  Chol:156 (12/12/2008), 171 (12/14/2007)  Trig:80.0 (12/12/2008), 88 (12/14/2007)  Orders: EKG w/ Interpretation (93000)  Problem # 7:   COPD (ICD-496)  Her updated medication list for this problem includes:    Advair Diskus 250-50 Mcg/dose Aepb (Fluticasone-salmeterol) .[name]... 1 inh two times a day    Ventolin Hfa 108 (90 Base) Mcg/act Aers (Albuterol sulfate) .[name]... 2 puffs qid as needed  Orders: Venipuncture (06301) T-Antinuclear Antib (ANA) (60109-32355) EKG w/ Interpretation (93000)  Complete Medication List: 1)  Ativan 2 Mg Tabs (Lorazepam) .... 1/2 by mouth at bedtime 2)  Lexapro 20 Mg Tabs (Escitalopram oxalate) .[name].. 1 by mouth once daily 3)  Crestor 20 Mg Tabs (Rosuvastatin calcium) .... Take one tablet each evening at bedtime-labs due now 4)  Prilosec 40 Mg (omeprazole)  .[name].. 1 by mouth once daily 5)  Micardis Hct 40-12.5 Mg Tabs (Telmisartan-hctz) .[name].. 1 by mouth qd 6)  Lumigan 0.03 % Soln (Bimatoprost) .... Daily as directed 7)  Vitamin B-12 1000 Mcg Tabs (Cyanocobalamin) .... Take 1 tab once daily 8)  Zostavax 73220 Unt/0.51ml Solr (Zoster vaccine live) .[name].. 1 ml  im  x1 9)  Nystatin Powd (Nystatin) .... Apply two times a day to affected area.  disp 30 grams 10)  Aleve 220 Mg Tabs (Naproxen sodium) 11)  Aspir-low 81 Mg Tbec (Aspirin) .[name].. 1 by mouth once daily 12)  Advair Diskus 250-50 Mcg/dose Aepb (Fluticasone-salmeterol) .[name].. 1 inh two times a day 13)  Ventolin Hfa 108 (90  Base) Mcg/act Aers (Albuterol sulfate) .... 2 puffs qid as needed  Other Orders: UA Dipstick w/o Micro (manual) (16109)  Patient Instructions: 1)  Please schedule a follow-up appointment in 2 weeks.  2)  We are scheduling pulmonary function test and ECHO ---you should receive a phone call within 1 week---please call if you don't hear anything Prescriptions: VENTOLIN HFA 108 (90 BASE) MCG/ACT AERS (ALBUTEROL SULFATE) 2 puffs qid as needed  #1 x 1   Entered and Authorized by:   [name] DO   Signed by:   [name] DO on 09/25/2009   Method used:   Print then Give to Patient   RxID:   [id number] ADVAIR DISKUS 250-50  MCG/DOSE AEPB (FLUTICASONE-SALMETEROL) 1 inh two times a day  #1 x 2   Entered and Authorized by:   [name] DO   Signed by:   [name] DO on 09/25/2009   Method used:   Print then Give to Patient   RxID:   [id number] LEXAPRO 20 MG TABS (ESCITALOPRAM OXALATE) 1 by mouth once daily  #30 x 2   Entered and Authorized by:   [name] DO   Signed by:   [name] DO on 09/25/2009   Method used:   Electronically to        [facility] [address] [id number]* (retail)       [address] [place], [place]  [address]       Ph: [phone]       Fax: [phone]   RxID:   [id number]    EKG  Procedure date:  09/25/2009  Findings:      Normal sinus rhythm with rate of:  69 bpm with pvc   Laboratory Results   Urine Tests   Date/Time Reported: September 25, 2009 3:51 PM   Routine Urinalysis   Color: straw Appearance: Cloudy Glucose: negative   (Normal Range: Negative) Bilirubin: negative   (Normal Range: Negative) Ketone: negative   (Normal Range: Negative) Spec. Gravity: 1.025   (Normal Range: 1.003-1.035) Blood: negative   (Normal Range: Negative) pH: 5.0   (Normal Range: 5.0-8.0) Protein: negative   (Normal Range: Negative) Urobilinogen: negative   (Normal Range: 0-1) Nitrite: negative   (Normal Range: Negative) Leukocyte Esterace: negative   (Normal Range: Negative)    Comments: Floydene Flock  September 25, 2009 3:51 PM      Appended Document: Orders Update     Clinical Lists Changes  Orders: Added new Service order of Prescription Created Electronically [phone]) - Signed

## 2010-05-18 NOTE — Progress Notes (Signed)
Summary: Triage   Phone Note Call from Patient Call back at Home Phone [phone]   Caller: Patient Call For: Dr. [name] Reason for Call: Talk to Nurse Summary of Call: Has questions why she is sch'd for Endo and her complaint yesterday was rectal bleeding Initial call taken by: Karna Christmas,  February 04, 2010 8:40 AM  Follow-up for Phone Call        Patient has heme positive stool not rectal bleeding.  patient is correctly scheduled for a EGD per Dr Regino Schultze note from 02/03/10   Follow-up by: Darcey Nora RN, CGRN,  February 04, 2010 9:25 AM

## 2010-05-18 NOTE — Assessment & Plan Note (Signed)
Summary: RTO 1 MONTH/CBS   Vital Signs:  Patient profile:   77 year old female Height:      65.25 inches Weight:      181 pounds BMI:     30.00 Temp:     98.4 degrees F oral Pulse rate:   76 / minute BP sitting:   100 / 78  (left arm)  Vitals Entered By: Jeremy Johann CMA (November 10, 2009 10:38 AM) CC: 1 month f/u, Depressive symptoms   History of Present Illness: Pt here to f/u depression.  She is doing better.  Pt is keeping herself busy around house.    Depressive symptoms      This is a 77 year old woman who presents with Depressive symptoms.  The patient denies depressed mood, loss of interest/pleasure, significant weight loss, significant weight gain, insomnia, hypersomnia, psychomotor agitation, and psychomotor retardation.  The patient denies fatigue or loss of energy, feelings of worthlessness, diminished concentration, indecisiveness, thoughts of death, thoughts of suicide, suicidal intent, and suicidal plans.  The patient reports the following psychosocial stressors: recent death of a loved one.  The patient denies abnormally elevated mood, abnormally irritable mood, decreased need for sleep, increased talkativeness, distractibility, flight of ideas, increased goal-directed activity, and inflated self-esteem/ grandiosity.    Current Medications (verified): 1)  Ativan 2 Mg Tabs (Lorazepam) .... 1/2 By Mouth At Bedtime 2)  Lexapro 20 Mg Tabs (Escitalopram Oxalate) .[name].. 1 By Mouth Once Daily 3)  Crestor 20 Mg Tabs (Rosuvastatin Calcium) .... Take One Tablet Each Evening At Bedtime-Labs Due Now 4)  Prilosec 40 Mg   (Omeprazole) .[name].. 1 By Mouth Once Daily 5)  Micardis Hct 40-12.5 Mg  Tabs (Telmisartan-Hctz) .[name].. 1 By Mouth Qd 6)  Lumigan 0.03 %  Soln (Bimatoprost) .... Daily As Directed 7)  Vitamin B-12 1000 Mcg Tabs (Cyanocobalamin) .... Take 1 Tab Once Daily 8)  Aleve 220 Mg Tabs (Naproxen Sodium) 9)  Aspir-Low 81 Mg Tbec (Aspirin) .[name].. 1 By Mouth Once Daily 10)  Advair  Diskus 250-50 Mcg/dose Aepb (Fluticasone-Salmeterol) .[name].. 1 Inh Two Times A Day 11)  Ventolin Hfa 108 (90 Base) Mcg/act Aers (Albuterol Sulfate) .... 2 Puffs Qid As Needed  Allergies (verified): 1)  ! Morphine 2)  ! Demerol  Past History:  Past medical, surgical, family and social histories (including risk factors) reviewed for relevance to current acute and chronic problems.  Past Medical History: Reviewed history from 06/07/2007 and no changes required. Hypertension RIGHT BREAST CANCER s/p tamoxifen 13 years ago Osteoporosis COPD Hyperlipidemia Anxiety Depression Peptic ulcer disease hiatal hernia  Past Surgical History: Reviewed history from 06/28/2007 and no changes required. RECONSTRUCT RIGHT BREAST(2000) Cataract extraction (08/2006) Cataract extraction (10/2006) Total knee replacement (01/11/2007)---Dr Charlann Boxer  Family History: Reviewed history from 12/19/2006 and no changes required. Family History of CAD Female 1st degree relative 2 sisters in 20s Family History of CAD Female 1st degree relative 2 brothers in 37s Family History Diabetes 1st degree relative Family History of Colon CA 1st degree relative at 40 yo-- father Family History Hypertension Family History of Stroke M 1st degree relative at 39-- mother  Social History: Reviewed history from 12/19/2006 and no changes required. Occupation:sales Married Never Smoked Alcohol use-no Drug use-no Regular exercise-no  Review of Systems      See HPI  Physical Exam  General:  Well-developed,well-nourished,in no acute distress; alert,appropriate and cooperative throughout examination Lungs:  Normal respiratory effort, chest expands symmetrically. Lungs are clear to auscultation, no crackles or wheezes. Heart:  normal rate.   Psych:  Oriented X3, normally interactive, good eye contact, not anxious appearing, and not depressed appearing.     Impression & Recommendations:  Problem # 1:  DEPRESSION  (ICD-311) Assessment Improved  Her updated medication list for this problem includes:    Ativan 2 Mg Tabs (Lorazepam) .[name]... 1/2 by mouth at bedtime    Lexapro 20 Mg Tabs (Escitalopram oxalate) .[name]... 1 by mouth once daily  Problem # 2:  HYPERLIPIDEMIA (ICD-272.4)  Her updated medication list for this problem includes:    Crestor 20 Mg Tabs (Rosuvastatin calcium) .[name]... Take one tablet each evening at bedtime-labs due now  Labs Reviewed: SGOT: 22 (09/25/2009)   SGPT: 10 (09/25/2009)   HDL:50.30 (12/12/2008), 43.3 (12/14/2007)  LDL:90 (12/12/2008), 110 (81/19/1478)  Chol:156 (12/12/2008), 171 (12/14/2007)  Trig:80.0 (12/12/2008), 88 (12/14/2007)  Problem # 3:  OSTEOPOROSIS (ICD-733.00)  Problem # 4:  HYPERTENSION (ICD-401.9)  Her updated medication list for this problem includes:    Micardis Hct 40-12.5 Mg Tabs (Telmisartan-hctz) .[name]... 1 by mouth qd  BP today: 100/78 Prior BP: 124/80 (10/09/2009)  Labs Reviewed: K+: 3.8 (09/25/2009) Creat: : 0.91 (09/25/2009)   Chol: 156 (12/12/2008)   HDL: 50.30 (12/12/2008)   LDL: 90 (12/12/2008)   TG: 80.0 (12/12/2008)  Complete Medication List: 1)  Ativan 2 Mg Tabs (Lorazepam) .... 1/2 by mouth at bedtime 2)  Lexapro 20 Mg Tabs (Escitalopram oxalate) .[name].. 1 by mouth once daily 3)  Crestor 20 Mg Tabs (Rosuvastatin calcium) .... Take one tablet each evening at bedtime-labs due now 4)  Prilosec 40 Mg (omeprazole)  .[name].. 1 by mouth once daily 5)  Micardis Hct 40-12.5 Mg Tabs (Telmisartan-hctz) .[name].. 1 by mouth qd 6)  Lumigan 0.03 % Soln (Bimatoprost) .... Daily as directed 7)  Vitamin B-12 1000 Mcg Tabs (Cyanocobalamin) .... Take 1 tab once daily 8)  Aleve 220 Mg Tabs (Naproxen sodium) 9)  Aspir-low 81 Mg Tbec (Aspirin) .[name].. 1 by mouth once daily 10)  Advair Diskus 250-50 Mcg/dose Aepb (Fluticasone-salmeterol) .[name].. 1 inh two times a day 11)  Ventolin Hfa 108 (90 Base) Mcg/act Aers (Albuterol sulfate) .... 2 puffs qid as needed  Patient  Instructions: 1)  Please schedule a follow-up appointment in 3 months -30 min 2)  schedule labs- fasting  272.4  401.9  733.0  vita D , lipid, hep, bmp, UA

## 2010-05-18 NOTE — Assessment & Plan Note (Signed)
Summary: UTI?/[facility]   Vital Signs:  Patient profile:   77 year old female Weight:      177 pounds Temp:     97.3 degrees F oral BP sitting:   112 / 80  (left arm)  Vitals Entered By: Doristine Devoid (July 29, 2009 2:01 PM) CC: UTI sx x1 week only some discomfort w/ going has some irritation in vaginal area    History of Present Illness: 77 yo woman here today for discomfort w/ urination- not w/ actually urination but when urine comes in contact w/ the skin.  rawness in the vaginal area.  no suprapubic pain.  denies frequency, urgency, hesitancy.  + redness on the skin.  + itching and burning.  pt admits to sweating and wakes up scratching.  has used hydrocortisone w/out relief but has some improvement w/ cornstarch.  unable to give sample today.  Allergies (verified): 1)  ! Morphine 2)  ! Demerol  Past History:  Past Medical History: Last updated: 06/07/2007 Hypertension RIGHT BREAST CANCER s/p tamoxifen 13 years ago Osteoporosis COPD Hyperlipidemia Anxiety Depression Peptic ulcer disease hiatal hernia  Review of Systems      See HPI  Physical Exam  General:  Well-developed,well-nourished,in no acute distress; alert,appropriate and cooperative throughout examination Abdomen:  no suprapubic or CVA tenderness Genitalia:  erythema of labia w/ satellite lesions consistent w/ yeast   Impression & Recommendations:  Problem # 1:  DYSURIA (ICD-788.1) Assessment Unchanged pt's sxs most likely due to external yeast.  start miconazole alternating w/ nystatin powder to help keep pt dry.  pt to return w/ urine sample (unable to void today).  reviewed supportive care and red flags that should prompt return.  Pt expresses understanding and is in agreement w/ this plan.  Complete Medication List: 1)  Ativan 2 Mg Tabs (Lorazepam) .[name].. 1 by mouth at bedtime 2)  Lexapro 10 Mg Tabs (Escitalopram oxalate) .[name].. 1 by mouth once daily 3)  Crestor 20 Mg Tabs (Rosuvastatin calcium) .... Take  one tablet each evening at bedtime-labs due now 4)  Prilosec 40 Mg (omeprazole)  .[name].. 1 by mouth once daily 5)  Micardis Hct 40-12.5 Mg Tabs (Telmisartan-hctz) .[name].. 1 by mouth qd 6)  Lumigan 0.03 % Soln (Bimatoprost) .... Daily as directed 7)  Vitamin B-12 1000 Mcg Tabs (Cyanocobalamin) .... Take 1 tab once daily 8)  Zostavax 04540 Unt/0.75ml Solr (Zoster vaccine live) .[name].. 1 ml  im  x1 9)  Nystatin Powd (Nystatin) .... Apply two times a day to affected area.  disp 30 grams  Patient Instructions: 1)  Please bring [name] a urine sample as able 2)  Start using the Miconazole cream (available over the counter) morning and night 3)  Use the Nystatin powder late morning and mid afternoon to treat the yeast and keep you dry 4)  Call with any questions or concerns 5)  Hang in there! Prescriptions: NYSTATIN  POWD (NYSTATIN) apply two times a day to affected area.  disp 30 grams  #30 x 0   Entered and Authorized by:   Neena Rhymes MD   Signed by:   Neena Rhymes MD on 07/29/2009   Method used:   Electronically to        [facility] [address] [facility] [address] #[id number]* (retail)       [address] [place], [place]  [address]       Ph: [phone]       Fax: [phone]   RxID:  [id number]  

## 2010-05-18 NOTE — Miscellaneous (Signed)
Summary: BONE DENSITY  Clinical Lists Changes  Orders: Added new Test order of T-Bone Densitometry (77080) - Signed Added new Test order of T-Lumbar Vertebral Assessment (77082) - Signed 

## 2010-05-18 NOTE — Miscellaneous (Signed)
Summary: [facility]  [facility]   Imported By: Lanelle Bal 01/18/2010 08:16:29  _____________________________________________________________________  External Attachment:    Type:   Image     Comment:   External Document  Appended Document: [facility]     Clinical Lists Changes  Observations: Added new observation of FLU VAX: Fluvax MCR (01/06/2010 9:55)       Immunization History:  Influenza Immunization History:    Influenza:  Fluvax MCR (01/06/2010)

## 2010-05-18 NOTE — Progress Notes (Signed)
Summary: Triage   Phone Note Call from Patient Call back at Home Phone [phone]   Caller: Patient Call For: Dr. [name] Reason for Call: Talk to Nurse Summary of Call: Pt. thought her Endo was sch'd for today and it is sch'd for 02-15-10....already has a driver sch'd and having severe diarrhea. "how can I survive until next week" Initial call taken by: Karna Christmas,  February 08, 2010 8:06 AM  Follow-up for Phone Call        patient was confused on her EGD date.  Per IDX, her instructions, and office visit from 02/03/10 patient is scheduled for 02/15/10/.  Patient  is scheduled for 02/10/10 at 10:30.  I have advised her that this is not going to diagnose diarrhea.  That she is scheduled for a EGD for heme positive stool. Follow-up by: Darcey Nora RN, CGRN,  February 08, 2010 8:18 AM

## 2010-05-20 NOTE — Assessment & Plan Note (Signed)
Summary: COP   Visit Type:  Hospital Follow-up Copy to:  na Primary Provider/Referring Provider:  [name], DO  CC:  HFU...ILD...no complaints today.  History of Present Illness: 77 yo woman, never smoker, hx ulcerative colitis, breast CA. Admitted for UTI, A Fib + RVR. During that hospitalization noted to have B infilltrates, was treated empirically for ? pulm edema or PNA w abx. Appearance on film and clinical hx more consistent with COP. Treated with pred 40mg  once daily starting 12/30 w clinical improvement, now follows up.    In retrospect has has dyspnea with exertion evaluated by Dr Laury Axon, Treated beginning in Spring '11 w Advair for ? COPD (never smoked but 2nd hand smoke)  Feeling tired since d/c from hosp. Breathing seems to be OK but not baseline. Able to do most of her daily activities.    Preventive Screening-Counseling & Management  Alcohol-Tobacco     Smoking Status: never  Current Medications (verified): 1)  Ativan 2 Mg Tabs (Lorazepam) .... 1/2 By Mouth At Bedtime 2)  Lexapro 20 Mg Tabs (Escitalopram Oxalate) .[name].. 1 By Mouth Once Daily 3)  Crestor 20 Mg Tabs (Rosuvastatin Calcium) .... Take One Tablet Each Evening At Bedtime- 4)  Nexium 40 Mg Cpdr (Esomeprazole Magnesium) .[name].. 1 By Mouth Once Daily 5)  Micardis Hct 40-12.5 Mg  Tabs (Telmisartan-Hctz) .[name].. 1 By Mouth Qd 6)  Lumigan 0.03 %  Soln (Bimatoprost) .... Daily As Directed 7)  Vitamin B-12 1000 Mcg Tabs (Cyanocobalamin) .... Take 1 Tab Once Daily 8)  Aspir-Low 81 Mg Tbec (Aspirin) .[name].. 1 By Mouth Once Daily 9)  Advair Diskus 250-50 Mcg/dose Aepb (Fluticasone-Salmeterol) .[name].. 1 Inh Two Times A Day 10)  Ventolin Hfa 108 (90 Base) Mcg/act Aers (Albuterol Sulfate) .... 2 Puffs Qid As Needed 11)  Bc Headache 325-16-95 Mg Tabs (Aspirin-Salicylamide-Caffeine) .... As Needed 12)  Carafate 1 Gm Tabs (Sucralfate) .... Take 1 Tablet By Mouth Two Times A Day 13)  Asacol Hd 800 Mg Tbec (Mesalamine) .... 4 Tablets  Daily  Allergies: 1)  ! Morphine 2)  ! Demerol 3)  ! * Nitroglycerin  Past History:  Past Medical History: Reviewed history from 03/25/2010 and no changes required. Hypertension RIGHT BREAST CANCER s/p tamoxifen 13 years ago Osteoporosis COPD Hyperlipidemia Anxiety Depression Peptic ulcer disease hiatal hernia Univeral Ulcerative Colitis  Past Surgical History: Reviewed history from 02/02/2010 and no changes required. RECONSTRUCT RIGHT BREAST(2000) Cataract extraction (08/2006) Cataract extraction (10/2006) Total Left knee replacement (01/11/2007)---Dr [facility] Hysterectomy Hemorrhoidectomy Tonsillectomy  Family History: Reviewed history from 02/02/2010 and no changes required. Family History of CAD Female 1st degree relative 2 sisters in 59s Family History of CAD Female 1st degree relative 2 brothers in 7s Family History Diabetes 1st degree relative Family History of Colon CA 1st degree relative at 6 yo-- father Family History Hypertension Family History of Stroke M 1st degree relative at 55-- mother Family History of Crohn's: Brother Family History of Breast Cancer: Aunt Family History of Heart Disease: 2 Brothers, 1 Sister Family History of Cirrhosis: Brother  Social History: Reviewed history from 02/02/2010 and no changes required. 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 Alcohol use-no Drug use-no Regular exercise-no  Vital Signs:  Patient profile:   77 year old female Height:      65.25 inches (165.74 cm) Weight:      164.25 pounds (74.66 kg) BMI:     27.22 O2 Sat:      97 % on Room air Temp:     97.7 degrees F (  36.50 degrees C) oral Pulse rate:   122 / minute BP sitting:   130 / 78  (left arm) Cuff size:   regular  Vitals Entered By: Michel Bickers CMA (April 28, 2010 1:28 PM)  O2 Sat at Rest %:  97 O2 Flow:  Room air CC: HFU...ILD...no complaints today Comments Medications reviewed with patient Michel Bickers CMA  April 28, 2010 1:45  PM   Physical Exam  General:  elderly woman, normal appearance.   Head:  normocephalic and atraumatic Eyes:  conjunctiva and sclera clear Nose:  no deformity, discharge, inflammation, or lesions Mouth:  no deformity or lesions Neck:  no masses, thyromegaly, or abnormal cervical nodes Lungs:  few bibasilar crackles Heart:  irregular, no M Abdomen:  not examined Extremities:  no clubbing, cyanosis, edema, or deformity noted Neurologic:  non-focal Psych:  poor memory, oriented    Pulmonary Function Test Date: 04/20/2010 Height (in.): 65 Gender: Female  Pre-Spirometry FVC    Value: 1.60 L/min   Pred: 2.85 L/min     % Pred: 55 % FEV1    Value: 1.35 L     Pred: 2.15 L     % Pred: 63 % FEV1/FVC  Value: 85 %     Pred: 74 %     % Pred: 114 % FEF 25-75  Value: 1.70 L/min   Pred: 1.61 L/min     % Pred: 105 %  Post-Spirometry FVC    Value: 1.67 L/min   Pred: 2.85 L/min     % Pred: 58 % FEV1    Value: 1.41 L     Pred: 2.15 L     % Pred: 66 % FEV1/FVC  Value: 85 %     Pred: 74 %     % Pred: 114 % FEF 25-75  Value: 1.93 L/min   Pred: 1.61 L/min     % Pred: 108 %  Lung Volumes TLC    Value: 4.59 L   % Pred: 88 % RV    Value: 2.90 L   % Pred: 121 % DLCO    Value: 12.22 %   % Pred: 47 % DLCO/VA  Value: 4.29 %   % Pred: 87 %  Impression & Recommendations:  Problem # 1:  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 Clinically better on Pred. Also w hx longer-standing dyspnea, on Advair. PFT with possible mixed disease vs restriction.  - pred taper then re-image - continue Advair for now, not clear she will require long-term - rov to discuss after pred tapered to off  Medications Added to Medication List This Visit: 1)  Prednisone 20 Mg Tabs (Prednisone) .... 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  2)  Continue prednisone 40mg  once daily for the next week, then decrease to 30mg  once daily for 1 week, then 20mg  once daily x 1 week, then 10mg  once daily for a week, then stop.  3)  We will repeat your CXR in 1 month, and will consider repeating your CT scan of the chest at that time as well. 4)  Follow up with Dr Delton Coombes in 1 month to review your status and your X-ray.  Prescriptions: PREDNISONE 20 MG TABS (  PREDNISONE) take 40mg  by mouth once daily x 1 wk, then 30mg  once daily x 1 wk, then 20mg  once daily x 1 wk, then 10mg  once daily x 1 wk, then stop  #36 x 0   Entered and Authorized by:   Leslye Peer MD   Signed by:   Leslye Peer MD on 04/28/2010   Method used:   Electronically to        [facility] [address] #[id number]* (retail)       [address] [place], [place]  [address]       Ph: [phone]       Fax: [phone]   RxID:   [id number]

## 2010-05-20 NOTE — Discharge Summary (Signed)
  NAMEKonica, [name]                  ACCOUNT NO.:  [id number]  MEDICAL RECORD NO.:  [id number]          PATIENT TYPE:  INP  LOCATION:  5023                         FACILITY:  [facility]  PHYSICIAN:  Ladell Pier, M.D.   DATE OF BIRTH:  1930-08-09  DATE OF ADMISSION:  04/14/2010 DATE OF DISCHARGE:                              DISCHARGE SUMMARY   ADDENDUM I saw the patient for 2 days.  Dr. Waymon Amato already did the discharge diagnoses, procedures, and hospital course.  The patient was transferred from the step-down unit to the floor.  On seeing the patient, she was stable and waiting to get a pulmonary function test prior to discharge. The patient had the pulmonary function test done and the conclusion was that there was some interstitial lung disease.  Per Pulmonary recommendation, she is to be discharged on 40 of prednisone and follow up with Dr. Delton Coombes in the office on the 11th.  The patient scheduled appointment at 1:30 p.m.  The patient was started on metformin for her diabetes since she is on chronic steroids at this point and she will follow up outpatient for some diabetes education.  She was given some education inpatient.  DISCHARGE PROBLEM LIST: 1. Hypokalemia.  The patient was noted to be hypokalemic.  Potassium     3.0.  The patient received 2 doses of potassium today. 2. She was not discharged on her Micardis HCT.  That can be     readdressed when she follows up with her PCP in 1-2 weeks as her     blood pressure at this time is 115/72.  DISCHARGE MEDICATIONS: 1. Metformin XR 500 daily. 2. Prednisone 40 mg daily. 3. Advair twice daily 250/50. 4. Asacol 2 tablets twice daily. 5. Aspirin 81 mg every morning. 6. Crestor 20 mg at bedtime. 7. Lexapro 20 mg daily. 8. Lorazepam 2 mg half-tablet at bedtime. 9. Lumigan eye drops daily at bedtime. 10.Nexium 40 mg every morning. 11.Ventolin inhaler 4 times daily as needed. 12.Vitamin B12 one tablet by mouth twice  daily.     Ladell Pier, M.D.     [place]/MED[facility]  D:  04/20/2010  T:  04/20/2010  Job:  [id number]  cc:   Leslye Peer, MD Neena Rhymes, M.D.  Electronically Signed by Ladell Pier M.D. on 05/20/2010 01:32:12 PM

## 2010-05-20 NOTE — Assessment & Plan Note (Signed)
Summary: hosptial f/u//pt diagnosed pnemonia//ok per kim/kn   Vital Signs:  Patient profile:   77 year old female Weight:      168.2 pounds Temp:     97.6 degrees F oral BP sitting:   118 / 70  (left arm) Cuff size:   regular  Vitals Entered By: Almeta Monas CMA Duncan Dull) (May 14, 2010 11:38 AM) CC: Hospt f/u---wants to discuss Metformin that was prescribed at the hospt--- c/o UTI, Dysuria   History of Present Illness: Pt here f/u hospital.  She is seeing pulmonary for pneumonia.  She is feeling much better but still feels tired and worn out.  Pt also feels like she has another UTI.   Dysuria      This is a 77 year old woman who presents with Dysuria.  The patient complains of burning with urination and urinary frequency, but denies urgency, hematuria, vaginal discharge, vaginal itching, and vaginal sores.  The patient denies the following associated symptoms: nausea, vomiting, fever, shaking chills, flank pain, abdominal pain, back pain, pelvic pain, and arthralgias.  Risk factors for urinary tract infection include prior antibiotics.  The patient denies the following risk factors: immunosuppression, history of GU anomaly, history of pyelonephritis, pregnancy, history of STD, and analgesic abuse.  History is significant for recent UTI.    Problems Prior to Update: 1)  Oth Spec Alveol&parietoalveol Pneumonopathies  (ICD-516.8) 2)  Tachycardia  (ICD-785.0) 3)  Weight Loss-abnormal  (ICD-783.21) 4)  Hemorrhoids-internal  (ICD-455.0) 5)  Rectal Bleeding  (ICD-569.3) 6)  Gastric Outlet Obstruction  (ICD-537.0) 7)  Rheumatic Heart Disease  (ICD-398.90) 8)  Colitis  (ICD-558.9) 9)  Hx of Esophageal Stricture  (ICD-530.3) 10)  Diverticulosis, Colon  (ICD-562.10) 11)  Osteoarthritis  (ICD-715.90) 12)  Hiatal Hernia  (ICD-553.3) 13)  Gerd  (ICD-530.81) 14)  Fh of Colon Cancer  (ICD-153.9) 15)  Hx of Duodenal Ulcer  (ICD-532.90) 16)  Guaiac Positive Stool  (ICD-578.1) 17)  Hip Pain,  Right  (ICD-719.45) 18)  Diarrhea  (ICD-787.91) 19)  Abdominal Pain Other Specified Site  (ICD-789.09) 20)  Shoulder Pain, Right  (ICD-719.41) 21)  Chest Pain Unspecified  (ICD-786.50) 22)  Shortness of Breath  (ICD-786.05) 23)  Headache  (ICD-784.0) 24)  Decreased Hearing, Bilateral  (ICD-389.9) 25)  Unspec Symptom Assoc W/female Genital Organs  (ICD-625.9) 26)  Dysuria  (ICD-788.1) 27)  Back Pain, Thoracic Region  (ICD-724.1) 28)  Sinusitis- Acute-nos  (ICD-461.9) 29)  Asymptomatic Postmenopausal Status  (ICD-V49.81) 30)  Fatigue  (ICD-780.79) 31)  Decreased Hearing, Bilateral  (ICD-389.9) 32)  Acute Bronchitis  (ICD-466.0) 33)  Cough  (ICD-786.2) 34)  Dizziness  (ICD-780.4) 35)  Hiatal Hernia, Hx of  (ICD-V12.79) 36)  Peptic Ulcer Disease  (ICD-533.90) 37)  Neoplasm, Malignant, Breast, Right  (ICD-174.9) 38)  Preventive Health Care  (ICD-V70.0) 39)  Preoperative Examination  (ICD-V72.84) 40)  Family History of Colon Ca 1st Degree Relative <60  (ICD-V16.0) 41)  Family History Diabetes 1st Degree Relative  (ICD-V18.0) 42)  Family History of Cad Female 1st Degree Relative <50  (ICD-V17.3) 29)  Family History of Cad Female 1st Degree Relative <60  (ICD-V16.49) 44)  Depression  (ICD-311) 45)  Anxiety  (ICD-300.00) 46)  Hyperlipidemia  (ICD-272.4) 47)  COPD  (ICD-496) 48)  Colonoscopy, Hx of  (ICD-V12.79) 49)  Mastectomy, Right, Hx of  (ICD-V45.71) 50)  Tonsillectomy and Adenoidectomy, Hx of  (ICD-V45.79) 51)  Total Hysterectomy and Bilateral Salpingoopherectomy, Hx of  (ICD-V45.77) 52)  Osteoporosis  (ICD-733.00) 53)  Glaucoma  (  ICD-365.9) 54)  Echocardiogram, Hx of  (ICD-V12.59) 55)  Premature Ventricular Contractions  (ICD-427.69) 56)  Rheumatic Fever, Hx of  (ICD-V12.59) 57)  Hypertension  (ICD-401.9)  Medications Prior to Update: 1)  Ativan 2 Mg Tabs (Lorazepam) .... 1/2 By Mouth At Bedtime 2)  Lexapro 20 Mg Tabs (Escitalopram Oxalate) .[name].. 1 By Mouth Once Daily 3)   Crestor 20 Mg Tabs (Rosuvastatin Calcium) .... Take One Tablet Each Evening At Bedtime- 4)  Nexium 40 Mg Cpdr (Esomeprazole Magnesium) .[name].. 1 By Mouth Once Daily 5)  Micardis Hct 40-12.5 Mg  Tabs (Telmisartan-Hctz) .[name].. 1 By Mouth Qd 6)  Lumigan 0.03 %  Soln (Bimatoprost) .... Daily As Directed 7)  Vitamin B-12 1000 Mcg Tabs (Cyanocobalamin) .... Take 1 Tab Once Daily 8)  Aspir-Low 81 Mg Tbec (Aspirin) .[name].. 1 By Mouth Once Daily 9)  Advair Diskus 250-50 Mcg/dose Aepb (Fluticasone-Salmeterol) .[name].. 1 Inh Two Times A Day 10)  Ventolin Hfa 108 (90 Base) Mcg/act Aers (Albuterol Sulfate) .... 2 Puffs Qid As Needed 11)  Bc Headache 325-16-95 Mg Tabs (Aspirin-Salicylamide-Caffeine) .... As Needed 12)  Carafate 1 Gm Tabs (Sucralfate) .... Take 1 Tablet By Mouth Two Times A Day 13)  Asacol Hd 800 Mg Tbec (Mesalamine) .... 4 Tablets Daily 14)  Prednisone 20 Mg Tabs (Prednisone) .... Take 40mg  By Mouth Once Daily X 1 Wk, Then 30mg  Once Daily X 1 Wk, Then 20mg  Once Daily X 1 Wk, Then 10mg  Once Daily X 1 Wk, Then Stop  Current Medications (verified): 1)  Ativan 2 Mg Tabs (Lorazepam) .... 1/2 By Mouth At Bedtime 2)  Lexapro 20 Mg Tabs (Escitalopram Oxalate) .[name].. 1 By Mouth Once Daily 3)  Crestor 20 Mg Tabs (Rosuvastatin Calcium) .... Take One Tablet Each Evening At Bedtime- 4)  Nexium 40 Mg Cpdr (Esomeprazole Magnesium) .[name].. 1 By Mouth Once Daily 5)  Micardis Hct 40-12.5 Mg  Tabs (Telmisartan-Hctz) .[name].. 1 By Mouth Qd 6)  Lumigan 0.03 %  Soln (Bimatoprost) .... Daily As Directed 7)  Vitamin B-12 1000 Mcg Tabs (Cyanocobalamin) .... Take 1 Tab Once Daily 8)  Aspir-Low 81 Mg Tbec (Aspirin) .[name].. 1 By Mouth Once Daily 9)  Advair Diskus 250-50 Mcg/dose Aepb (Fluticasone-Salmeterol) .[name].. 1 Inh Two Times A Day 10)  Ventolin Hfa 108 (90 Base) Mcg/act Aers (Albuterol Sulfate) .... 2 Puffs Qid As Needed 11)  Bc Headache 325-16-95 Mg Tabs (Aspirin-Salicylamide-Caffeine) .... As Needed 12)  Carafate 1 Gm Tabs  (Sucralfate) .... Take 1 Tablet By Mouth Two Times A Day 13)  Asacol Hd 800 Mg Tbec (Mesalamine) .... 4 Tablets Daily 14)  Prednisone 20 Mg Tabs (Prednisone) .... Take 40mg  By Mouth Once Daily X 1 Wk, Then 30mg  Once Daily X 1 Wk, Then 20mg  Once Daily X 1 Wk, Then 10mg  Once Daily X 1 Wk, Then Stop 15)  Metformin Hcl 500 Mg Tabs (Metformin Hcl) .[name].. 1 By Mouth Once Daily 16)  Cipro 500 Mg Tabs (Ciprofloxacin Hcl) .[name].. 1 By Mouth Two Times A Day  Allergies (verified): 1)  ! Morphine 2)  ! Demerol 3)  ! * Nitroglycerin  Past History:  Past Medical History: Last updated: 03/25/2010 Hypertension RIGHT BREAST CANCER s/p tamoxifen 13 years ago Osteoporosis COPD Hyperlipidemia Anxiety Depression Peptic ulcer disease hiatal hernia Univeral Ulcerative Colitis  Past Surgical History: Last updated: 02/02/2010 RECONSTRUCT RIGHT BREAST(2000) Cataract extraction (08/2006) Cataract extraction (10/2006) Total Left knee replacement (01/11/2007)---Dr [facility] Hysterectomy Hemorrhoidectomy Tonsillectomy  Family History: Last updated: 02/02/2010 Family History of CAD Female 1st degree  relative 2 sisters in 69s Family History of CAD Female 1st degree relative 2 brothers in 83s Family History Diabetes 1st degree relative Family History of Colon CA 1st degree relative at 97 yo-- father Family History Hypertension Family History of Stroke M 1st degree relative at 36-- mother Family History of Crohn's: Brother Family History of Breast Cancer: Aunt Family History of Heart Disease: 2 Brothers, 1 Sister Family History of Cirrhosis: Brother  Social History: Last updated: 02/02/2010 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 Family History of CAD Female 1st degree relative 2 sisters in 44s Family History of CAD Female 1st degree relative 2 brothers in 97s Family History Diabetes 1st degree relative Family History of Colon CA 1st degree relative at 71 yo-- father Family History Hypertension Family History of Stroke M 1st degree relative at 73-- mother Family History of Crohn's: Brother Family History of Breast Cancer: Aunt Family History of Heart Disease: 2 Brothers, 1 Sister Family History of Cirrhosis: Brother  Social History: Reviewed history from 02/02/2010 and no changes required. 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 Lungs:  Normal respiratory effort, chest expands symmetrically. Lungs are clear to auscultation, no crackles or wheezes. Heart:  normal rate and no murmur.   Abdomen:  + suprapubic tenderness Extremities:  No clubbing, cyanosis, edema, or deformity noted with normal full range of motion of all joints.   Psych:  Oriented X3 and normally interactive.     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 1 by mouth once daily    Bc Headache 325-16-95 Mg Tabs (Aspirin-salicylamide-caffeine) .[name]... As needed  Discussed use of medications, application of heat or cold, and exercises.   Problem # 3:  DYSURIA (ICD-788.1)  Her updated medication list for this problem includes:    Cipro 500 Mg Tabs (Ciprofloxacin hcl) .[name]... 1 by mouth two times a day  Encouraged to push clear liquids, get enough  rest, and take acetaminophen as needed. To be seen in 10 days if no improvement, sooner if worse.  Problem # 4:  HYPERLIPIDEMIA (ICD-272.4)  Her updated medication list for this problem includes:    Crestor 20 Mg Tabs (Rosuvastatin calcium) .[name]... Take one tablet each evening at bedtime-  Labs Reviewed: SGOT: 23 (02/01/2010)   SGPT: 14 (02/01/2010)   HDL:45.10 (11/17/2009), 50.30 (12/12/2008)  LDL:75 (11/17/2009), 90 (12/12/2008)  Chol:139 (11/17/2009), 156 (12/12/2008)  Trig:95.0 (11/17/2009), 80.0 (12/12/2008)  Problem # 5:  HYPERGLYCEMIA (ICD-790.29) secondary to prednisone Her updated medication list for this problem includes:    Metformin Hcl 500 Mg Tabs (Metformin hcl) .[name]... 1 by mouth once daily  Complete Medication List: 1)  Ativan 2 Mg Tabs (Lorazepam) .... 1/2 by mouth at bedtime 2)  Lexapro 20 Mg Tabs (Escitalopram oxalate) .[name].. 1 by mouth once daily 3)  Crestor 20 Mg Tabs (Rosuvastatin calcium) .... Take one tablet each evening at bedtime- 4)  Nexium 40 Mg Cpdr (Esomeprazole magnesium) .[name].. 1 by mouth once daily 5)  Micardis Hct 40-12.5 Mg Tabs (Telmisartan-hctz) .[name].. 1 by mouth qd 6)  Lumigan 0.03 % Soln (Bimatoprost) .... Daily as directed 7)  Vitamin B-12 1000 Mcg Tabs (Cyanocobalamin) .... Take 1 tab once daily 8)  Aspir-low 81 Mg Tbec (Aspirin) .[name].. 1 by mouth once daily 9)  Advair Diskus 250-50 Mcg/dose Aepb (Fluticasone-salmeterol) .[name].. 1 inh two times a day 10)  Ventolin Hfa 108 (90 Base) Mcg/act Aers (Albuterol sulfate) .... 2 puffs qid as needed 11)  Bc Headache 325-16-95 Mg Tabs (Aspirin-salicylamide-caffeine) .... As needed 12)  Carafate 1 Gm Tabs (Sucralfate) .... Take 1 tablet by mouth two times a day 13)  Asacol Hd 800 Mg Tbec (Mesalamine) .... 4 tablets daily 14)  Prednisone 20 Mg Tabs (Prednisone) .... Take 40mg  by mouth once daily x 1 wk, then 30mg  once daily x 1 wk, then 20mg  once daily x 1 wk, then 10mg  once daily x 1 wk, then stop 15)  Metformin Hcl 500  Mg Tabs (Metformin hcl) .[name].. 1 by mouth once daily 16)  Cipro 500 Mg Tabs (Ciprofloxacin hcl) .[name].. 1 by mouth two times a day  Other Orders: T-Culture, Urine ([id number]) UA Dipstick w/o Micro (manual) ([id number])  Patient Instructions: 1)  stop micardis 2)  Please schedule a follow-up appointment in 2 weeks.  3)  check blood sugars two times a day ---fasting and 2 hours after a meal --- we will look at these in 2 weeks.   Prescriptions: METFORMIN HCL 500 MG TABS (METFORMIN HCL) 1 by mouth once daily  #30 x 2   Entered and Authorized by:   [name] DO   Signed by:   [name] DO on 05/14/2010   Method used:   Electronically to        [facility] #[id number]* (retail)       [address] [place], [place]  [address]       Ph: [phone]       Fax: [phone]   RxID:   [id number] CIPRO 500 MG TABS (CIPROFLOXACIN HCL) 1 by mouth two times a day  #10 x 0   Entered and Authorized by:   [name] DO   Signed by:   [name] DO on 05/14/2010   Method used:   Electronically to        [facility] #[id number]* (retail)       [address] [place], [place]  [address]       Ph: [phone]       Fax: [phone]   RxID:   [id number]    Orders Added: 1)  T-Culture, Urine [[id number]] 2)  Est. Patient Level IV [15176] 3)  UA Dipstick w/o Micro (manual) [81002]    Laboratory Results   Urine Tests    Routine Urinalysis   Color: lt. yellow Appearance: Cloudy Glucose: negative   (Normal Range: Negative) Bilirubin: negative   (Normal Range: Negative) Ketone: negative   (Normal Range: Negative) Spec. Gravity: <1.005   (Normal Range: 1.003-1.035) Blood: trace-lysed   (Normal Range: Negative) pH: 7.5   (Normal Range: 5.0-8.0) Protein: trace   (Normal Range: Negative) Urobilinogen: 1.0   (Normal Range: 0-1) Nitrite: positive   (Normal Range: Negative) Leukocyte Esterace: large   (Normal Range: Negative)

## 2010-05-20 NOTE — Progress Notes (Signed)
Summary: triage   Phone Note Call from Patient Call back at Work Phone [phone]   Caller: Patients daughter Call For: Dr. [name] Reason for Call: Talk to Nurse Summary of Call: Pt is backing off steriods and and she is getting weak and jittery, not sure what is causing this Initial call taken by: [place] Johnson,  April 13, 2010 12:43 PM  Follow-up for Phone Call        Patient has been tapering her prednisone as ordered.  For the last few days patient has c/o weakness and being jittery.  Daughter reports hypertension and tachcardia.  Daughter also reports that her mother has c/o some SOB and has been using her inhalers more.  Daughter took her mothers pulse and says it is regular at this time.  Has had readings ranging from 130-70 over the past several days, all of those readings were taken on a BP machine. Daughter is not sure when her mother used her atrovent inhalers in relation to the increased HR.  I have asked them to contact her primary care to be seen.  UC is under good control at this time.  Daughter also reports that they have had a lot of family functions and that maybe why her mom is so tired.  Daughter will call primary care MD for an appointment about elevated HR.  Last HR was 108.   Follow-up by: Darcey Nora RN, CGRN,  April 13, 2010 2:10 PM  Additional Follow-up for Phone Call Additional follow up Details #1::        spoke w/ patient daughter noted several differances in patient pulse rate going up and down ranging between 90-120's w/ and w/o activity informed daughter that patient should go to emergency room says patient refused and wanted appt for tomorrow aware Dr. Laury Axon out of office so appt scheduled w/ Dr. Beverely Low informed that if symptoms worsen she really needs to be seen at emergency room today daughter ok'd instructions. Additional Follow-up by: Doristine Devoid CMA,  April 13, 2010 2:21 PM

## 2010-05-20 NOTE — Assessment & Plan Note (Signed)
Summary: hospital f/u per [facility] 9:00 AM appt/[place]    History of Present Illness Visit Type: Follow-up Visit Primary GI MD: [name] MD Primary Virgia Kelner: [name], DO Requesting Sharise Lippy: na Chief Complaint: follow-up hospitalization for universal ulcerative colitis  c/o some dizziness History of Present Illness:   70 notes on white female with new diagnosis of ulcerative colitis. Status post hospitalization 4 weeks ago.Colonoscopy on 03/03/10 showed severe pancolitis consistent with ulcerative colitis. She was treated with  bowl rest, cTNA , and intravenous steroids and mesalamine. She was discharged on prednisone 40 mg a day. She has not been able to take her Asacol because of expense. She also has a history of peptic ulcer disease and partial gastric outlet obstruction. History of breast cancer. She has done extremely well since her discharge having one or 2 solid bowel movements a day. She is still taking Lomotil  twice a day. She denies rectal bleeding or abdominal pain. Her weight has increased by 5 pounds   GI Review of Systems      Denies abdominal pain, acid reflux, belching, bloating, chest pain, dysphagia with liquids, dysphagia with solids, heartburn, loss of appetite, nausea, vomiting, vomiting blood, weight loss, and  weight gain.        Denies anal fissure, black tarry stools, change in bowel habit, constipation, diarrhea, diverticulosis, fecal incontinence, heme positive stool, hemorrhoids, irritable bowel syndrome, jaundice, light color stool, liver problems, rectal bleeding, and  rectal pain.    Current Medications (verified): 1)  Ativan 2 Mg Tabs (Lorazepam) .... 1/2 By Mouth At Bedtime 2)  Lexapro 20 Mg Tabs (Escitalopram Oxalate) .[name].. 1 By Mouth Once Daily 3)  Crestor 20 Mg Tabs (Rosuvastatin Calcium) .... Take One Tablet Each Evening At Bedtime- 4)  Nexium 40 Mg Cpdr (Esomeprazole Magnesium) .[name].. 1 By Mouth Once Daily 5)  Micardis Hct 40-12.5 Mg  Tabs  (Telmisartan-Hctz) .[name].. 1 By Mouth Qd 6)  Lumigan 0.03 %  Soln (Bimatoprost) .... Daily As Directed 7)  Vitamin B-12 1000 Mcg Tabs (Cyanocobalamin) .... Take 1 Tab Once Daily 8)  Aspir-Low 81 Mg Tbec (Aspirin) .[name].. 1 By Mouth Once Daily 9)  Advair Diskus 250-50 Mcg/dose Aepb (Fluticasone-Salmeterol) .[name].. 1 Inh Two Times A Day 10)  Ventolin Hfa 108 (90 Base) Mcg/act Aers (Albuterol Sulfate) .... 2 Puffs Qid As Needed 11)  Vitamin D3 2000 Unit Caps (Cholecalciferol) .[name].. 1 By Mouth Once Daily 12)  Levsin/sl 0.125 Mg Subl (Hyoscyamine Sulfate) .[name].. 1-2 Sl Every 4 Hours As Needed Diarrhea 13)  Bc Headache 325-16-95 Mg Tabs (Aspirin-Salicylamide-Caffeine) .... As Needed 14)  Carafate 1 Gm Tabs (Sucralfate) .... Take 1 Tablet By Mouth Two Times A Day 15)  Hydrocortisone Acetate 25 Mg Supp (Hydrocortisone Acetate) .... Use 1 Suppository At Bedtime 16)  Asacol 400 Mg Tbec (Mesalamine) .... Take 3 Tablets Tid 17)  Asacol Hd 800 Mg Tbec (Mesalamine) .... Take 4 Tablets Daily 18)  Lomotil 2.5-0.025 Mg Tabs (Diphenoxylate-Atropine) .... Take 1 Tablet By Mouth Two Times A Day  Allergies (verified): 1)  ! Morphine 2)  ! Demerol  Past History:  Past Medical History: Hypertension RIGHT BREAST CANCER s/p tamoxifen 13 years ago Osteoporosis COPD Hyperlipidemia Anxiety Depression Peptic ulcer disease hiatal hernia Univeral Ulcerative Colitis  Past Surgical History: Reviewed history from 02/02/2010 and no changes required. RECONSTRUCT RIGHT BREAST(2000) Cataract extraction (08/2006) Cataract extraction (10/2006) Total Left knee replacement (01/11/2007)---Dr [facility] [place] Hysterectomy Hemorrhoidectomy Tonsillectomy  Family History: Reviewed history from 02/02/2010 and no changes required. Family History of  CAD Female 1st degree relative 2 sisters in 44s Family History of CAD Female 1st degree relative 2 brothers in 9s Family History Diabetes 1st degree relative Family History of Colon CA 1st degree  relative at 58 yo-- father Family History Hypertension Family History of Stroke M 1st degree relative at 35-- mother Family History of Crohn's: Brother Family History of Breast Cancer: Aunt Family History of Heart Disease: 2 Brothers, 1 Sister Family History of Cirrhosis: Brother  Social History: Reviewed history from 02/02/2010 and no changes required. 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 Alcohol use-no Drug use-no Regular exercise-no  Review of Systems       The patient complains of allergy/sinus and headaches-new.  The patient denies anemia, anxiety-new, arthritis/joint pain, back pain, blood in urine, breast changes/lumps, change in vision, confusion, cough, coughing up blood, depression-new, fainting, fatigue, fever, hearing problems, heart murmur, heart rhythm changes, itching, menstrual pain, muscle pains/cramps, night sweats, nosebleeds, pregnancy symptoms, shortness of breath, skin rash, sleeping problems, sore throat, swelling of feet/legs, swollen lymph glands, thirst - excessive , urination - excessive , urination changes/pain, urine leakage, vision changes, and voice change.         Pertinent positive and negative review of systems were noted in the above HPI. All other ROS was otherwise negative.   Vital Signs:  Patient profile:   77 year old female Height:      65.25 inches Weight:      165.8 pounds BMI:     27.48 Pulse rate:   76 / minute Pulse rhythm:   regular BP sitting:   124 / 74  (left arm)  Vitals Entered By: Milford Cage NCMA (March 25, 2010 9:01 AM)  Physical Exam  General:  Well developed, well nourished, no acute distress. Eyes:  PERRLA, no icterus. Mouth:  No deformity or lesions, dentition normal. Lungs:  Clear throughout to auscultation. Heart:  Regular rate and rhythm; no murmurs, rubs,  or bruits. Abdomen:  soft abdomen with normoactive bowel sounds. No focal tenderness. No distention Rectal:  soft Hemoccult negative stool Msk:   Symmetrical with no gross deformities. Normal posture. Extremities:  No clubbing, cyanosis, edema or deformities noted. Skin:  Intact without significant lesions or rashes. Psych:  Alert and cooperative. Normal mood and affect.   Impression & Recommendations:  Problem # 1:  COLITIS (ICD-558.9) Patient has a diagnoses of ulcerative colitis involving the entire colon. She is responding to steroids. She will start her Asacol HD  samples that were given. She will be on 3.2 g until January 1st , then  use her prescription for Asacol 400 mg 1.2 g 3 times a day. She will decrease her Lomotil gradually until she stops it and will taper down her prednisone from 40 to 30 mg daily and then by 5 mg every 2 weeks. I will see her in 4 weeks. We will check her CBC today. Orders: TLB-CBC Platelet - w/Differential (85025-[id number]) TLB-Sedimentation Rate (ESR) ([id number])  Problem # 2:  GASTRIC OUTLET OBSTRUCTION (ICD-537.0) There are currently no symptoms. She will continue Nexium 40 mg daily.  Patient Instructions: 1)  Please take Prednisone 30 mg daily x 2 weeks, then decrease by 5 mg every 2 weeks. 2)  Please schedule a follow-up appointment in 4 weeks.  3)  Your physician requests that you go to the basement floor of our office to have the following labwork completed before leaving today: CBC, Sed. rate 4)  Reduce Lomotil to one tablet a day for several days then  discontinue. 5)  We have given you samples of Asacol HD to take 4 tablets daily. 6)  Copy sent to : Dr Yates Decamp 7)  The medication list was reviewed and reconciled.  All changed / newly prescribed medications were explained.  A complete medication list was provided to the patient / caregiver.

## 2010-05-20 NOTE — Assessment & Plan Note (Signed)
Summary: ELEVATED PULSE RATE-REFUSED ER/CDJ   Vital Signs:  Patient profile:   77 year old female Weight:      161 pounds BMI:     26.68 O2 Sat:      90 % on Room air Temp:     97.6 degrees F oral Pulse rate:   123 / minute BP sitting:   110 / 62  (left arm)  Vitals Entered By: Doristine Devoid CMA (April 14, 2010 1:47 PM)  O2 Flow:  Room air  Serial Vital Signs/Assessments:  Time      Position  BP       Pulse  Resp  Temp     By 1:48 PM                      115                   Chemira Jones CMA                                PEF    PreRx  PostRx Time      O2 Sat  O2 Type     L/min  L/min  L/min   By 1:48 PM   93  %   Room air                          Chemira Jones CMA  CC: SOB, elevated pulse, weakness sweating alot x1 wk    History of Present Illness: 77 yo woman here today for racing HR.  pt and daughter report that for last week pulse has been fluctuating dramatically- as high as the 140s.  pt feels weak, has had increased sweating and SOB above baseline.  denies CP.  was advised to go to ER by triage nurse yesterday and by fireman that lives up the street from them.  denies fever or cough.  has hx of PVCs.  reports mom had hx of Afib.  Current Medications (verified): 1)  Ativan 2 Mg Tabs (Lorazepam) .... 1/2 By Mouth At Bedtime 2)  Lexapro 20 Mg Tabs (Escitalopram Oxalate) .[name].. 1 By Mouth Once Daily 3)  Crestor 20 Mg Tabs (Rosuvastatin Calcium) .... Take One Tablet Each Evening At Bedtime- 4)  Nexium 40 Mg Cpdr (Esomeprazole Magnesium) .[name].. 1 By Mouth Once Daily 5)  Micardis Hct 40-12.5 Mg  Tabs (Telmisartan-Hctz) .[name].. 1 By Mouth Qd 6)  Lumigan 0.03 %  Soln (Bimatoprost) .... Daily As Directed 7)  Vitamin B-12 1000 Mcg Tabs (Cyanocobalamin) .... Take 1 Tab Once Daily 8)  Aspir-Low 81 Mg Tbec (Aspirin) .[name].. 1 By Mouth Once Daily 9)  Advair Diskus 250-50 Mcg/dose Aepb (Fluticasone-Salmeterol) .[name].. 1 Inh Two Times A Day 10)  Ventolin Hfa 108 (90 Base) Mcg/act Aers  (Albuterol Sulfate) .... 2 Puffs Qid As Needed 11)  Bc Headache 325-16-95 Mg Tabs (Aspirin-Salicylamide-Caffeine) .... As Needed 12)  Carafate 1 Gm Tabs (Sucralfate) .... Take 1 Tablet By Mouth Two Times A Day 13)  Asacol Hd 800 Mg Tbec (Mesalamine) .... 4 Tablets Daily  Allergies (verified): 1)  ! Morphine 2)  ! Demerol  Past History:  Past medical, surgical, family and social histories (including risk factors) reviewed for relevance to current acute and chronic problems.  Past Medical History: Reviewed history from 03/25/2010 and no changes required. Hypertension RIGHT BREAST CANCER s/p tamoxifen 13  years ago Osteoporosis COPD Hyperlipidemia Anxiety Depression Peptic ulcer disease hiatal hernia Univeral Ulcerative Colitis  Past Surgical History: Reviewed history from 02/02/2010 and no changes required. RECONSTRUCT RIGHT BREAST(2000) Cataract extraction (08/2006) Cataract extraction (10/2006) Total Left knee replacement (01/11/2007)---Dr [facility] Hysterectomy Hemorrhoidectomy Tonsillectomy  Family History: Reviewed history from 02/02/2010 and no changes required. Family History of CAD Female 1st degree relative 2 sisters in 69s Family History of CAD Female 1st degree relative 2 brothers in 96s Family History Diabetes 1st degree relative Family History of Colon CA 1st degree relative at 41 yo-- father Family History Hypertension Family History of Stroke M 1st degree relative at 36-- mother Family History of Crohn's: Brother Family History of Breast Cancer: Aunt Family History of Heart Disease: 2 Brothers, 1 Sister Family History of Cirrhosis: Brother  Social History: Reviewed history from 02/02/2010 and no changes required. 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 Lungs:  decreased breath sounds on L Heart:  tachy S1/S2 Pulses:  +2 radial, PT Extremities:  no C/C/E   Impression & Recommendations:  Problem # 1:  TACHYCARDIA (ICD-785.0) Assessment New  pt having symptomatic tachycardia.  is sinus in office but has new Tw[place] inversions.  given diaphoresis, pallor, and increased SOB will send directly to [facility] for evaluation and tx.  pt still reluctant but daughter in agreement.  total time spent w/ pt 31 minutes, >50% spent counseling pt on consequences of avoiding care/tx, including possible MI, CVA.  Orders: EKG w/ Interpretation (93000)  Complete Medication List: 1)  Ativan 2 Mg Tabs (Lorazepam) .... 1/2 by mouth at bedtime 2)  Lexapro 20 Mg Tabs (Escitalopram oxalate) .[name].. 1 by mouth once daily 3)  Crestor 20 Mg Tabs (Rosuvastatin calcium) .... Take one tablet each evening at bedtime- 4)  Nexium 40 Mg Cpdr (Esomeprazole magnesium) .[name].. 1 by mouth once daily 5)  Micardis Hct 40-12.5 Mg Tabs (Telmisartan-hctz) .[name].. 1 by mouth qd 6)  Lumigan 0.03 % Soln (Bimatoprost) .... Daily as directed 7)  Vitamin B-12 1000 Mcg Tabs (Cyanocobalamin) .... Take 1 tab once daily 8)  Aspir-low 81 Mg Tbec (Aspirin) .[name].. 1 by mouth once daily 9)  Advair Diskus 250-50 Mcg/dose Aepb (Fluticasone-salmeterol) .[name].. 1 inh two times a day 10)  Ventolin Hfa 108 (90 Base) Mcg/act Aers (Albuterol sulfate) .... 2 puffs qid as needed 11)  Bc Headache 325-16-95 Mg Tabs (Aspirin-salicylamide-caffeine) .... As needed 12)  Carafate 1 Gm Tabs (Sucralfate) .... Take 1 tablet by mouth two times a day 13)  Asacol Hd 800 Mg Tbec (Mesalamine) .... 4 tablets daily   Orders Added: 1)  Est. Patient Level IV [16109] 2)  EKG w/ Interpretation [93000]  Appended Document: ELEVATED PULSE RATE-REFUSED ER/CDJ discussed sending pt via EMS but pt declined.  was barely willing to go w/ daughter, in fact said 'i'm not going now' to which daughter and i responded, 'yes you  are'.  pt now going to ER.  Biomedical scientist notified.

## 2010-05-20 NOTE — Assessment & Plan Note (Signed)
Summary: 4 + WEEK FU/JMS    History of Present Illness Visit Type: Follow-up Visit Primary GI MD: [name] MD Primary Provider: [name], DO Requesting Provider: na Chief Complaint: F/u for ulcerative colitis. Pt states that she is feeling better and denies any GI complaints  History of Present Illness:   This is a 77 year old white female with ulcerative pancolitis. She is status post hospitalization in November 2011. She is currently on a prednisone taper. She has no complaints pertaining to her GI tract. She was diagnosed with cryptogenic organizing pneumonia by DR Delton Coombes during recent hospitalization and was put back on prednisone 40 mg daily. She is supposed to taper to 30 mg today. She has a history of peptic ulcer disease and gastric outlet obstruction  on recent EGD which is currently asymptomatic on Nexium 40 mg daily. Her weight has remained stable. Her breast cancer has been in remission. She has new onset diabetes precipitated by use of steroids.   GI Review of Systems      Denies abdominal pain, acid reflux, belching, bloating, chest pain, dysphagia with liquids, dysphagia with solids, heartburn, loss of appetite, nausea, vomiting, vomiting blood, weight loss, and  weight gain.        Denies anal fissure, black tarry stools, change in bowel habit, constipation, diarrhea, diverticulosis, fecal incontinence, heme positive stool, hemorrhoids, irritable bowel syndrome, jaundice, light color stool, liver problems, rectal bleeding, and  rectal pain.    Current Medications (verified): 1)  Ativan 2 Mg Tabs (Lorazepam) .... 1/2 By Mouth At Bedtime 2)  Lexapro 20 Mg Tabs (Escitalopram Oxalate) .[name].. 1 By Mouth Once Daily 3)  Crestor 20 Mg Tabs (Rosuvastatin Calcium) .... Take One Tablet Each Evening At Bedtime- 4)  Nexium 40 Mg Cpdr (Esomeprazole Magnesium) .[name].. 1 By Mouth Once Daily 5)  Micardis Hct 40-12.5 Mg  Tabs (Telmisartan-Hctz) .[name].. 1 By Mouth Qd 6)  Lumigan 0.03 %  Soln  (Bimatoprost) .... Daily As Directed 7)  Vitamin B-12 1000 Mcg Tabs (Cyanocobalamin) .... Take 1 Tab Once Daily 8)  Aspir-Low 81 Mg Tbec (Aspirin) .[name].. 1 By Mouth Once Daily 9)  Advair Diskus 250-50 Mcg/dose Aepb (Fluticasone-Salmeterol) .[name].. 1 Inh Two Times A Day 10)  Ventolin Hfa 108 (90 Base) Mcg/act Aers (Albuterol Sulfate) .... 2 Puffs Qid As Needed 11)  Bc Headache 325-16-95 Mg Tabs (Aspirin-Salicylamide-Caffeine) .... As Needed 12)  Carafate 1 Gm Tabs (Sucralfate) .... Take 1 Tablet By Mouth Two Times A Day 13)  Asacol Hd 800 Mg Tbec (Mesalamine) .... 4 Tablets Daily 14)  Prednisone 20 Mg Tabs (Prednisone) .... Take 40mg  By Mouth Once Daily X 1 Wk, Then 30mg  Once Daily X 1 Wk, Then 20mg  Once Daily X 1 Wk, Then 10mg  Once Daily X 1 Wk, Then Stop  Allergies (verified): 1)  ! Morphine 2)  ! Demerol 3)  ! * Nitroglycerin  Past History:  Past Medical History: Reviewed history from 03/25/2010 and no changes required. Hypertension RIGHT BREAST CANCER s/p tamoxifen 13 years ago Osteoporosis COPD Hyperlipidemia Anxiety Depression Peptic ulcer disease hiatal hernia Univeral Ulcerative Colitis  Past Surgical History: Reviewed history from 02/02/2010 and no changes required. RECONSTRUCT RIGHT BREAST(2000) Cataract extraction (08/2006) Cataract extraction (10/2006) Total Left knee replacement (01/11/2007)---Dr [facility] Hysterectomy Hemorrhoidectomy Tonsillectomy  Family History: Reviewed history from 02/02/2010 and no changes required. Family History of CAD Female 1st degree relative 2 sisters in 65s Family History of CAD Female 1st degree relative 2 brothers in 58s Family History Diabetes 1st  degree relative Family History of Colon CA 1st degree relative at 21 yo-- father Family History Hypertension Family History of Stroke M 1st degree relative at 57-- mother Family History of Crohn's: Brother Family History of Breast Cancer: Aunt Family History of Heart Disease: 2 Brothers, 1  Sister Family History of Cirrhosis: Brother  Social History: Reviewed history from 02/02/2010 and no changes required. 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 Alcohol use-no Drug use-no Regular exercise-no  Review of Systems  The patient denies allergy/sinus, anemia, anxiety-new, arthritis/joint pain, back pain, blood in urine, breast changes/lumps, change in vision, confusion, cough, coughing up blood, depression-new, fainting, fatigue, fever, headaches-new, hearing problems, heart murmur, heart rhythm changes, itching, menstrual pain, muscle pains/cramps, night sweats, nosebleeds, pregnancy symptoms, shortness of breath, skin rash, sleeping problems, sore throat, swelling of feet/legs, swollen lymph glands, thirst - excessive , urination - excessive , urination changes/pain, urine leakage, vision changes, and voice change.         Pertinent positive and negative review of systems were noted in the above HPI. All other ROS was otherwise negative.   Vital Signs:  Patient profile:   77 year old female Height:      65.25 inches Weight:      164 pounds BMI:     27.18 BSA:     1.82 Pulse rate:   92 / minute Pulse rhythm:   regular BP sitting:   124 / 68  (left arm) Cuff size:   regular  Vitals Entered By: Ok Anis CMA (May 05, 2010 9:54 AM)  Physical Exam  General:  Well developed, well nourished, no acute distress. Eyes:  PERRLA, no icterus. Mouth:  No deformity or lesions, dentition normal. Neck:  Supple; no masses or thyromegaly. Lungs:  bilateral inspiratory rales Heart:  normal S1-S2 Abdomen:  Soft, nontender and nondistended. No masses, hepatosplenomegaly or hernias noted. Normal bowel sounds. Extremities:  No clubbing, cyanosis, edema or deformities noted. Skin:  Intact without significant lesions or rashes. Psych:  Alert and cooperative. Normal mood and affect.   Impression & Recommendations:  Problem # 1:  GASTRIC OUTLET OBSTRUCTION  (ICD-537.0) Patient is to continue Nexium 40 mg daily.  Problem # 2:  COLITIS (ICD-558.9) Universal ulcerative colitis is now in remission. I would have her to continue her prednisone taper. After being on 10 mg of prednisone a day, she needs toreduce  to 5 mg for 2 weeks before stopping completely.  Patient Instructions: 1)  Continue prednisone taper as per Dr. Delton Coombes for your lung condition. After you go to 10 mg of prednisone a day please taper down to 5 mg a day for 2 weeks before discontinuing prednisone. 2)  Continue Asacol HD 800 mg 4 tablets daily. 3)  Office visit 2 months. 4)  Continue Nexium 40 mg daily. 5)  Copy sent to : Dr Langston Masker, Dr Loann Quill 6)  The medication list was reviewed and reconciled.  All changed / newly prescribed medications were explained.  A complete medication list was provided to the patient / caregiver.

## 2010-05-20 NOTE — Letter (Signed)
Summary: Progress Notes      NAMELura, [name]                  ACCOUNT NO.:  [id number]      MEDICAL RECORD NO.:  [id number]          PATIENT TYPE:  INP      LOCATION:  3306                         FACILITY:  [facility]      PHYSICIAN:  Marcellus Scott, MD     DATE OF BIRTH:  [birth year]                                    PROGRESS NOTE         PRIMARY CARE PHYSICIAN:   Neena Rhymes, MD      GASTROENTEROLOGIST:   [name]. [name], MD.      CARDIOLOGIST:   [name]. Bensimhon, MD      DISCHARGE DIAGNOSES:   1. Acute hypoxic respiratory failure secondary to cryptogenic-       organizing pneumonitis, improving.   2. Citrobacter urinary tract infection, treated.   3. Hypertension.   4. Newly diagnosed type 2 diabetes mellitus, possibly secondary to       steroids.   5. Hypokalemia.   6. Anemia.   7. Ulcerative colitis.   8. History of anxiety and depression.   9. History of breast cancer.   10.Emphysema secondary to exposure to secondhand smoke.      DISCHARGE MEDICATIONS:   To be dictated by discharging MD.      PROCEDURES:   None.      IMAGING STUDIES:   1. Chest x-ray April 18, 2010.  Impression, improving congestive       heart failure.   2. Chest x-ray 30th of December 2011.  Impression, mild progression of       asymmetric airspace disease, suspicious for pulmonary edema,       although infection could not be completely excluded in the proper       clinical setting.   3. Chest x-ray December 29.  Impression interval progression of the       diffuse, but asymmetric airspace disease, left greater than right.   4. CT angiogram of the chest on December 29.   Impression,   A.  No evidence of acute pulmonary embolism.   B.  Diffuse bilateral airspace opacities.  Appearance is most consistent   with edema although could reflect atypical pneumonia.   C.  Large paraesophageal hernia.   D.  Stable osseous retropulsion related to chronic T12 fracture status   post  vertebroplasty.   5. Chest x-ray December 28, impression bilateral airspace disease       which may represent edema or pneumonia.  Large hiatal hernia.   6. 2-D echocardiogram on April 15, 2010.  Conclusion, left       ventricle cavity size was normal.  Wall thickness was normal.       Systolic function was normal.  Estimated ejection fraction was in       the range of 60% to 65%.  Regional wall motion abnormalities cannot       be excluded.  Doppler parameters are consistent with abnormal left       ventricular relaxation (grade 1 diastolic dysfunction).  Mitral       valve calcified annulus.  Mildly thickened leaflets.  Left atrium       was moderately dilated.  Pulmonary artery systolic pressure was       mildly increased.      LABORATORY DATA:   CBC shows hemoglobin 9.9, hematocrit 31.5, MCV 90, white blood cell 5.4,   platelets 222.  Basic metabolic panel yesterday only remarkable for   glucose 132, BUN 29 and creatinine 0.73.  Angiotensin converting enzyme   level was 26.  C-reactive protein 5.6.  Rheumatoid factor was less than   20.  Blood cultures x2 from April 15, 2010 were no growth to date.   Urine Legionella antigen negative.  BNP 46.  Urine culture showed   greater than 100,000 colonies/mL of Citrobacter freundii which was   sensitive to ceftriaxone, ciprofloxacin, gentamicin, levofloxacin,   nitrofurantoin, tobramycin and Bactrim, but was resistant to cefazolin.   Urinary streptococcal antigen was negative.  Procalcitonin on admission   less than 0.1, lactic acid was 1.1, hemoglobin A1c of 6.9.  Lipid panel   within normal limits.  Cardiac enzymes were cycled and negative.  TSH   was 1.241, total T4 was 6.7.  Urinalysis showed too numerous to count   white blood cells and many bacteria.  Hepatic panel only pertinent for   albumin of 3.3.      CONSULTATIONS:   1. Cardiology, Pricilla Riffle, MD of [facility].   2. Pulmonary physicians, Felipa Evener, MD   and Leslye Peer,       MD.      Complaints today, the patient had one large liquid stool today, but   had a normal BM yesterday.  There is no abdominal pain.  She is   ambulating in the room and denies any dyspnea on exertion or cough.      PHYSICAL EXAMINATION:   GENERAL:  [name] is in no obvious distress.  Telemetry shows sinus   rhythm in the 70s to 90 beats per minute and occasional episodes of   sinus tachycardia in 120, possibly from activity.  Her CBGs range   between 158-195 mg/dL.   VITAL SIGNS:  Temperature 97.7 degrees Fahrenheit, pulse 91 per minute   ranging from 71-91 per minute, blood pressure 112/40 mmHg, respiratory   rate 18 per minute and saturating between 90% to 100% on room air.   RESPIRATORY:  Clear to auscultation anteriorly with occasional basal   crackles, but significantly improved than 48-72 hours ago.  No increased   work of breathing.   CARDIOVASCULAR:  First and second heart sounds heard.  Regular rate and   rhythm.  There is no murmurs or JVD.   ABDOMEN:  Nondistended, nontender, soft and bowel sounds are present.   CENTRAL NERVOUS SYSTEM:  The patient is awake, alert, oriented x3 with   no focal neurological deficits.   EXTREMITIES:  Grade 5/5.            HOSPITAL COURSE:   Ms. Willis is a pleasant 77 year old Caucasian female patient with   history of emphysema secondary to second hand smoke, recently diagnosed   ulcerative colitis who was hospitalized in November 2011, urinary tract   infection, hypertension, history of breast cancer who presented to the   emergency department with history of worsening dyspnea, mild orthopnea.   She had no cough or fevers or chills.  She denied any dysuria or urinary   frequency.  She  was admitted for further evaluation and management.        PROBLEMS:   1. Acute hypoxic respiratory failure.  Initially it was thought that       this was secondary to congestive heart failure from ischemic        cardiomyopathy.  Her cardiac enzymes were cycled and negative.       Echo was done with findings as above.  She was empirically placed       on both IV antibiotics including Zosyn, vancomycin and Zithromax       and IV Lasix.  Cardiology was consulted.  Despite diuresis, the       patient continued to have significant dyspnea and her chest x-ray       actually got worse.  Cardiology was not convinced that her symptoms       were cardiac in etiology and hence pulmonary physicians were       consulted.  Her steroids were increased to 60 mg of prednisone       daily.  Over the next 2-3 days, the patient has gradually made       improvement.  Currently, she is on room air and saturating in the       90s and denies any complaints of dyspnea or cough.  I have       discussed her case with the pulmonologist today and Dr. Delton Coombes       indicates that she most likely has cryptogenic-organizing       pneumonitis and recommends discontinuing her antibiotics for this       indication and maintaining her on 60 mg of prednisone which may       need to be tapered and left on 40 mg of prednisone daily for the       next 3 weeks at least.  However, if her symptoms worsen then she       may require a fiberoptic bronchoscopy to rule out atypical       infections.   2. Citrobacter urinary tract infection.  The patient had a single       spike of temperature of 101.2 degrees Fahrenheit on December 29.       She has been on IV Zosyn since the 29 and has completed 3 days of       antibiotics.  Unclear if this was truly a urinary tract infection       or asymptomatic bacteriuria, but in any event this was treated as       an uncomplicated acute cystitis.  If she spikes temperatures again       or has urinary symptoms, then consider repeating a urinalysis and       culture.   3. Hypokalemia, repleted.   4. Hypertension, which is controlled.   5. Newly diagnosed type 2 diabetes mellitus, possibly steroid induced.         Consider either managing with diabetic diet alone or starting on       oral hypoglycemic agents.   6. Anemia which is possibly of chronic disease.  We will request an       anemia panel and CBCs need to be closely followed   7. Ulcerative colitis which is stable at this time.            DISPOSITION:   The patient at this time is stable to transfer to a regular medical   floor and will need observation for the  next approximately 48 hours and   if stable then the patient may be discharged home.  The pulmonary   physicians continued to follow the patient and would await their final   recommendations.  The cardiologist have at this time signed off   indicating that her presentation was not cardiac in origin.               Marcellus Scott, MD               AH/MEDQ  D:  04/18/2010  T:  04/18/2010  Job:  [id number]      Electronically Signed by Marcellus Scott MD on 04/27/2010 01:43:55 AM

## 2010-05-26 NOTE — Progress Notes (Signed)
Summary: refill  Phone Note Call from Patient   Caller: Patient Summary of Call: Pt daughter states that Pt has already use sample strips up and needs to have more test strip in order to continue testing BS. Pt daughter aware rx sent to pharmacy........Marland KitchenFelecia Deloach CMA  May 18, 2010 9:46 AM     New/Updated Medications: ONETOUCH TEST  STRP (GLUCOSE BLOOD) Test as directed ONETOUCH LANCETS  MISC (LANCETS) test as directed Prescriptions: ONETOUCH TEST  STRP (GLUCOSE BLOOD) Test as directed  #1 month x 0   Entered by:   Jeremy Johann CMA   Authorized by:   [name] DO   Signed by:   Jeremy Johann CMA on 05/18/2010   Method used:   Faxed to ...       [facility] [address] #[id number]* (retail)       [address] [place], [place]  [address]       Ph: [phone]       Fax: [phone]   RxID:   [id number]

## 2010-05-28 ENCOUNTER — Ambulatory Visit (INDEPENDENT_AMBULATORY_CARE_PROVIDER_SITE_OTHER): Payer: Medicare Other | Admitting: Family Medicine

## 2010-05-28 ENCOUNTER — Encounter: Payer: Self-pay | Admitting: Family Medicine

## 2010-05-28 DIAGNOSIS — N390 Urinary tract infection, site not specified: Secondary | ICD-10-CM

## 2010-05-28 DIAGNOSIS — R7309 Other abnormal glucose: Secondary | ICD-10-CM

## 2010-05-28 LAB — CONVERTED CEMR LAB
Ketones, urine, test strip: NEGATIVE
Nitrite: NEGATIVE
Protein, U semiquant: NEGATIVE
Urobilinogen, UA: 0.2

## 2010-05-31 ENCOUNTER — Encounter: Payer: Self-pay | Admitting: Emergency Medicine

## 2010-05-31 ENCOUNTER — Ambulatory Visit (INDEPENDENT_AMBULATORY_CARE_PROVIDER_SITE_OTHER)
Admission: RE | Admit: 2010-05-31 | Discharge: 2010-05-31 | Disposition: A | Discharge status: Home / Self Care | Payer: Medicare Other | Source: Ambulatory Visit | Attending: Emergency Medicine | Admitting: Emergency Medicine

## 2010-05-31 ENCOUNTER — Ambulatory Visit (INDEPENDENT_AMBULATORY_CARE_PROVIDER_SITE_OTHER): Payer: Medicare Other | Admitting: Emergency Medicine

## 2010-05-31 ENCOUNTER — Telehealth: Payer: Self-pay | Admitting: Internal Medicine

## 2010-05-31 ENCOUNTER — Other Ambulatory Visit: Payer: Self-pay | Admitting: Emergency Medicine

## 2010-05-31 DIAGNOSIS — J8409 Other alveolar and parieto-alveolar conditions: Secondary | ICD-10-CM

## 2010-06-09 NOTE — Assessment & Plan Note (Signed)
Summary: 2 week followup///sph   Vital Signs:  Patient profile:   77 year old female Weight:      173.2 pounds Temp:     98.0 degrees F oral BP sitting:   130 / 72  (left arm) Cuff size:   large  Vitals Entered By: Almeta Monas CMA Duncan Dull) (May 28, 2010 10:10 AM) CC: 2 week f/u---still having dysuria, Dysuria   History of Present Illness:  Dysuria      This is a 77 year old woman who presents with Dysuria.  The patient complains of burning with urination, but denies urinary frequency, urgency, hematuria, vaginal discharge, vaginal itching, vaginal sores, and penile discharge.  The patient denies the following associated symptoms: nausea, vomiting, fever, shaking chills, flank pain, abdominal pain, back pain, pelvic pain, and arthralgias.  The patient denies the following risk factors: prior antibiotics.  History is significant for recent UTI.    Pt brought in BS for me to reivew as well.  Evening bs running high.  Problems Prior to Update: 1)  Hyperglycemia  (ICD-790.29) 2)  Uti  (ICD-599.0) 3)  Oth Spec Alveol&parietoalveol Pneumonopathies  (ICD-516.8) 4)  Tachycardia  (ICD-785.0) 5)  Weight Loss-abnormal  (ICD-783.21) 6)  Hemorrhoids-internal  (ICD-455.0) 7)  Rectal Bleeding  (ICD-569.3) 8)  Gastric Outlet Obstruction  (ICD-537.0) 9)  Rheumatic Heart Disease  (ICD-398.90) 10)  Colitis  (ICD-558.9) 11)  Hx of Esophageal Stricture  (ICD-530.3) 12)  Diverticulosis, Colon  (ICD-562.10) 13)  Osteoarthritis  (ICD-715.90) 14)  Hiatal Hernia  (ICD-553.3) 15)  Gerd  (ICD-530.81) 16)  Fh of Colon Cancer  (ICD-153.9) 17)  Hx of Duodenal Ulcer  (ICD-532.90) 18)  Guaiac Positive Stool  (ICD-578.1) 19)  Hip Pain, Right  (ICD-719.45) 20)  Diarrhea  (ICD-787.91) 21)  Abdominal Pain Other Specified Site  (ICD-789.09) 22)  Shoulder Pain, Right  (ICD-719.41) 23)  Chest Pain Unspecified  (ICD-786.50) 24)  Shortness of Breath  (ICD-786.05) 25)  Headache  (ICD-784.0) 26)  Decreased  Hearing, Bilateral  (ICD-389.9) 27)  Unspec Symptom Assoc W/female Genital Organs  (ICD-625.9) 28)  Dysuria  (ICD-788.1) 29)  Back Pain, Thoracic Region  (ICD-724.1) 30)  Sinusitis- Acute-nos  (ICD-461.9) 31)  Asymptomatic Postmenopausal Status  (ICD-V49.81) 32)  Fatigue  (ICD-780.79) 33)  Decreased Hearing, Bilateral  (ICD-389.9) 34)  Acute Bronchitis  (ICD-466.0) 35)  Cough  (ICD-786.2) 36)  Dizziness  (ICD-780.4) 37)  Hiatal Hernia, Hx of  (ICD-V12.79) 38)  Peptic Ulcer Disease  (ICD-533.90) 39)  Neoplasm, Malignant, Breast, Right  (ICD-174.9) 40)  Preventive Health Care  (ICD-V70.0) 41)  Preoperative Examination  (ICD-V72.84) 42)  Family History of Colon Ca 1st Degree Relative <60  (ICD-V16.0) 43)  Family History Diabetes 1st Degree Relative  (ICD-V18.0) 44)  Family History of Cad Female 1st Degree Relative <50  (ICD-V17.3) 45)  Family History of Cad Female 1st Degree Relative <60  (ICD-V16.49) 46)  Depression  (ICD-311) 47)  Anxiety  (ICD-300.00) 48)  Hyperlipidemia  (ICD-272.4) 49)  COPD  (ICD-496) 50)  Colonoscopy, Hx of  (ICD-V12.79) 51)  Mastectomy, Right, Hx of  (ICD-V45.71) 52)  Tonsillectomy and Adenoidectomy, Hx of  (ICD-V45.79) 53)  Total Hysterectomy and Bilateral Salpingoopherectomy, Hx of  (ICD-V45.77) 54)  Osteoporosis  (ICD-733.00) 55)  Glaucoma  (ICD-365.9) 56)  Echocardiogram, Hx of  (ICD-V12.59) 57)  Premature Ventricular Contractions  (ICD-427.69) 58)  Rheumatic Fever, Hx of  (ICD-V12.59) 59)  Hypertension  (ICD-401.9)  Medications Prior to Update: 1)  Ativan 2 Mg Tabs (Lorazepam) .Marland KitchenMarland KitchenMarland Kitchen  1/2 By Mouth At Bedtime 2)  Lexapro 20 Mg Tabs (Escitalopram Oxalate) .[name].. 1 By Mouth Once Daily 3)  Crestor 20 Mg Tabs (Rosuvastatin Calcium) .... Take One Tablet Each Evening At Bedtime- 4)  Nexium 40 Mg Cpdr (Esomeprazole Magnesium) .[name].. 1 By Mouth Once Daily 5)  Micardis Hct 40-12.5 Mg  Tabs (Telmisartan-Hctz) .[name].. 1 By Mouth Qd 6)  Lumigan 0.03 %  Soln (Bimatoprost)  .... Daily As Directed 7)  Vitamin B-12 1000 Mcg Tabs (Cyanocobalamin) .... Take 1 Tab Once Daily 8)  Aspir-Low 81 Mg Tbec (Aspirin) .[name].. 1 By Mouth Once Daily 9)  Advair Diskus 250-50 Mcg/dose Aepb (Fluticasone-Salmeterol) .[name].. 1 Inh Two Times A Day 10)  Ventolin Hfa 108 (90 Base) Mcg/act Aers (Albuterol Sulfate) .... 2 Puffs Qid As Needed 11)  Bc Headache 325-16-95 Mg Tabs (Aspirin-Salicylamide-Caffeine) .... As Needed 12)  Carafate 1 Gm Tabs (Sucralfate) .... Take 1 Tablet By Mouth Two Times A Day 13)  Asacol Hd 800 Mg Tbec (Mesalamine) .... 4 Tablets Daily 14)  Prednisone 20 Mg Tabs (Prednisone) .... Take 40mg  By Mouth Once Daily X 1 Wk, Then 30mg  Once Daily X 1 Wk, Then 20mg  Once Daily X 1 Wk, Then 10mg  Once Daily X 1 Wk, Then Stop 15)  Metformin Hcl 500 Mg Tabs (Metformin Hcl) .[name].. 1 By Mouth Once Daily 16)  Onetouch Test  Strp (Glucose Blood) .... Test As Directed 17)  Onetouch Lancets  Misc (Lancets) .... Test As Directed  Current Medications (verified): 1)  Ativan 2 Mg Tabs (Lorazepam) .... 1/2 By Mouth At Bedtime 2)  Lexapro 20 Mg Tabs (Escitalopram Oxalate) .[name].. 1 By Mouth Once Daily 3)  Crestor 20 Mg Tabs (Rosuvastatin Calcium) .... Take One Tablet Each Evening At Bedtime- 4)  Nexium 40 Mg Cpdr (Esomeprazole Magnesium) .[name].. 1 By Mouth Once Daily 5)  Micardis Hct 40-12.5 Mg  Tabs (Telmisartan-Hctz) .[name].. 1 By Mouth Qd 6)  Lumigan 0.03 %  Soln (Bimatoprost) .... Daily As Directed 7)  Vitamin B-12 1000 Mcg Tabs (Cyanocobalamin) .... Take 1 Tab Once Daily 8)  Aspir-Low 81 Mg Tbec (Aspirin) .[name].. 1 By Mouth Once Daily 9)  Advair Diskus 250-50 Mcg/dose Aepb (Fluticasone-Salmeterol) .[name].. 1 Inh Two Times A Day 10)  Ventolin Hfa 108 (90 Base) Mcg/act Aers (Albuterol Sulfate) .... 2 Puffs Qid As Needed 11)  Bc Headache 325-16-95 Mg Tabs (Aspirin-Salicylamide-Caffeine) .... As Needed 12)  Carafate 1 Gm Tabs (Sucralfate) .... Take 1 Tablet By Mouth Two Times A Day 13)  Asacol Hd 800 Mg Tbec  (Mesalamine) .... 4 Tablets Daily 14)  Prednisone 20 Mg Tabs (Prednisone) .... Take 40mg  By Mouth Once Daily X 1 Wk, Then 30mg  Once Daily X 1 Wk, Then 20mg  Once Daily X 1 Wk, Then 10mg  Once Daily X 1 Wk, Then Stop 15)  Metformin Hcl 500 Mg Xr24h-Tab (Metformin Hcl) .[name].. 1 By Mouth Qpm 16)  Onetouch Test  Strp (Glucose Blood) .... Test As Directed 17)  Onetouch Lancets  Misc (Lancets) .... Test As Directed  Allergies (verified): 1)  ! Morphine 2)  ! Demerol 3)  ! * Nitroglycerin  Past History:  Past Medical History: Last updated: 03/25/2010 Hypertension RIGHT BREAST CANCER s/p tamoxifen 13 years ago Osteoporosis COPD Hyperlipidemia Anxiety Depression Peptic ulcer disease hiatal hernia Univeral Ulcerative Colitis  Past Surgical History: Last updated: 02/02/2010 RECONSTRUCT RIGHT BREAST(2000) Cataract extraction (08/2006) Cataract extraction (10/2006) Total Left knee replacement (01/11/2007)---Dr Olin Hysterectomy Hemorrhoidectomy Tonsillectomy  Family History: Last updated: 02/02/2010 Family History of CAD  Female 1st degree relative 2 sisters in 81s Family History of CAD Female 1st degree relative 2 brothers in 43s Family History Diabetes 1st degree relative Family History of Colon CA 1st degree relative at 75 yo-- father Family History Hypertension Family History of Stroke M 1st degree relative at 10-- mother Family History of Crohn's: Brother Family History of Breast Cancer: Aunt Family History of Heart Disease: 2 Brothers, 1 Sister Family History of Cirrhosis: Brother  Social History: Last updated: 02/02/2010 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 Alcohol use-no Drug use-no Regular exercise-no  Risk Factors: Alcohol Use: 0 (12/11/2008) Exercise: no (12/11/2008)  Risk Factors: Smoking Status: never (04/28/2010) Passive Smoke Exposure: no (12/11/2008)  Family History: Reviewed history from 02/02/2010 and no changes required. Family History of CAD  Female 1st degree relative 2 sisters in 70s Family History of CAD Female 1st degree relative 2 brothers in 67s Family History Diabetes 1st degree relative Family History of Colon CA 1st degree relative at 12 yo-- father Family History Hypertension Family History of Stroke M 1st degree relative at 30-- mother Family History of Crohn's: Brother Family History of Breast Cancer: Aunt Family History of Heart Disease: 2 Brothers, 1 Sister Family History of Cirrhosis: Brother  Social History: Reviewed history from 02/02/2010 and no changes required. 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 Abdomen:  Bowel sounds positive,abdomen soft and non-tender without masses, organomegaly or hernias noted. Extremities:  No clubbing, cyanosis, edema, or deformity noted with normal full range of motion of all joints.   Psych:  Oriented X3 and normally interactive.     Impression & Recommendations:  Problem # 1:  UTI (ICD-599.0) Assessment Improved  Orders: T-Culture, Urine ([id number])  Encouraged to push clear liquids, get enough rest, and take acetaminophen as needed. To be seen in 10 days if no improvement, sooner if worse.  Problem # 2:  HYPERGLYCEMIA (ICD-790.29)  Pt weaning off prednisone Her updated medication list for this problem includes:    Metformin Hcl 500 Mg Xr24h-tab (Metformin hcl) .[name]... 1 by mouth qpm  Labs Reviewed: Creat: 0.8 (02/01/2010)     Orders: Prescription Created Electronically [id number])  Complete Medication List: 1)  Ativan 2 Mg Tabs (Lorazepam) .... 1/2 by mouth at bedtime 2)  Lexapro 20 Mg Tabs (Escitalopram oxalate) .[name].. 1 by mouth once daily 3)  Crestor 20 Mg Tabs (Rosuvastatin calcium)  .... Take one tablet each evening at bedtime- 4)  Nexium 40 Mg Cpdr (Esomeprazole magnesium) .[name].. 1 by mouth once daily 5)  Micardis Hct 40-12.5 Mg Tabs (Telmisartan-hctz) .[name].. 1 by mouth qd 6)  Lumigan 0.03 % Soln (Bimatoprost) .... Daily as directed 7)  Vitamin B-12 1000 Mcg Tabs (Cyanocobalamin) .... Take 1 tab once daily 8)  Aspir-low 81 Mg Tbec (Aspirin) .[name].. 1 by mouth once daily 9)  Advair Diskus 250-50 Mcg/dose Aepb (Fluticasone-salmeterol) .[name].. 1 inh two times a day 10)  Ventolin Hfa 108 (90 Base) Mcg/act Aers (Albuterol sulfate) .... 2 puffs qid as needed 11)  Bc Headache 325-16-95 Mg Tabs (Aspirin-salicylamide-caffeine) .... As needed 12)  Carafate 1 Gm Tabs (Sucralfate) .... Take 1 tablet by mouth two times a day 13)  Asacol Hd 800 Mg Tbec (Mesalamine) .... 4 tablets daily 14)  Prednisone 20 Mg Tabs (Prednisone) .... Take 40mg  by mouth once daily  x 1 wk, then 30mg  once daily x 1 wk, then 20mg  once daily x 1 wk, then 10mg  once daily x 1 wk, then stop 15)  Metformin Hcl 500 Mg Xr24h-tab (Metformin hcl) .[name].. 1 by mouth qpm 16)  Onetouch Test Strp (Glucose blood) .... Test as directed 17)  Onetouch Lancets Misc (Lancets) .... Test as directed  Patient Instructions: 1)  increase glucophage to 500 mg two times a day ---when you run out the new rx I sent to pharmacy is for extended relase so yo will only need to take one. 2)  con't watch BS--- check 2x a day while weaning off prednisone 3)  recheck 2-3 weeks  Prescriptions: METFORMIN HCL 500 MG XR24H-TAB (METFORMIN HCL) 1 by mouth qpm  #30 x 2   Entered and Authorized by:   [name] DO   Signed by:   [name] DO on 05/28/2010   Method used:   Electronically to        [id number]* (retail)       [address] [place], [place]  [address]       Ph: [phone]       Fax: [phone]   RxID:   [id number]    Orders Added: 1)  T-Culture, Urine [[id number]] 2)  Est. Patient Level III [47425] 3)   Prescription Created Electronically [id number]    Laboratory Results   Urine Tests    Routine Urinalysis   Color: lt. yellow Appearance: Clear Glucose: negative   (Normal Range: Negative) Bilirubin: negative   (Normal Range: Negative) Ketone: negative   (Normal Range: Negative) Spec. Gravity: >=1.030   (Normal Range: 1.003-1.035) Blood: moderate   (Normal Range: Negative) pH: 5.0   (Normal Range: 5.0-8.0) Protein: negative   (Normal Range: Negative) Urobilinogen: 0.2   (Normal Range: 0-1) Nitrite: negative   (Normal Range: Negative) Leukocyte Esterace: negative   (Normal Range: Negative)

## 2010-06-09 NOTE — Progress Notes (Signed)
Summary: Asacol Samples  Medications Added ASACOL HD 800 MG TBEC (MESALAMINE) Take 4 tablets by mouth daily       Phone Note Other Incoming   Caller: Patient-Walk In Summary of Call: Patient's daughter comes today requesting refills of Asacol HD. I have given her 4 boxes. I will also send in a prescription for patient. They will let us know if the medication costs too much. Initial call taken by: Lamona Curl CMA (AAMA),  May 31, 2010 10:54 AM    New/Updated Medications: ASACOL HD 800 MG TBEC (MESALAMINE) Take 4 tablets by mouth daily Prescriptions: ASACOL HD 800 MG TBEC (MESALAMINE) Take 4 tablets by mouth daily  #120 x 2   Entered by:   Lamona Curl CMA (AAMA)   Authorized by:   Hart Carwin MD   Signed by:   Lamona Curl CMA (AAMA) on 05/31/2010   Method used:   Electronically to        [facility] [id number]* (retail)       [address] [place], [place]  [address]       Ph: [phone]       Fax: [phone]   RxID:   [id number]

## 2010-06-09 NOTE — Progress Notes (Signed)
Summary: List of Blood Sugars done at home  List of Blood Sugars done at home   Imported By: Maryln Gottron 06/01/2010 10:13:11  _____________________________________________________________________  External Attachment:    Type:   Image     Comment:   External Document

## 2010-06-09 NOTE — Assessment & Plan Note (Signed)
Summary: COP, dyspnea.    Visit Type:  Follow-up Copy to:  na Primary Provider/Referring Provider:  [name], DO  CC:  Pneumonia follow-up.  Pt says she has a dry cough w/ exertion only.  She is only using Advair once daily. Prednisone is 10mg  daily.[name]  History of Present Illness: 77 yo woman, never smoker, hx ulcerative colitis, breast CA. Admitted for UTI, A Fib + RVR. During that hospitalization noted to have B infilltrates, was treated empirically for ? pulm edema or PNA w abx. Appearance on film and clinical hx more consistent with COP. Treated with pred 40mg  once daily starting 12/30 w clinical improvement, now follows up.   In retrospect has has dyspnea with exertion evaluated by Dr Laury Axon, Treated beginning in Spring '11 w Advair for ? COPD (never smoked but 2nd hand smoke)  ROV 05/31/10 -- follow up visit for B infiltrates, possible COP. We have been tapering steroids, planning a slightly slower taper for her ulcerative colitis. Breathing is better than last visit, but still some SOB with significant exertion, example shopping all day. Has been taking Advair once daily on most days; has PFT that show mixed disease.    Current Medications (verified): 1)  Ativan 2 Mg Tabs (Lorazepam) .... 1/2 By Mouth At Bedtime 2)  Lexapro 20 Mg Tabs (Escitalopram Oxalate) .[name].. 1 By Mouth Once Daily 3)  Crestor 20 Mg Tabs (Rosuvastatin Calcium) .... Take One Tablet Each Evening At Bedtime- 4)  Nexium 40 Mg Cpdr (Esomeprazole Magnesium) .[name].. 1 By Mouth Once Daily 5)  Lumigan 0.03 %  Soln (Bimatoprost) .... Daily As Directed 6)  Vitamin B-12 1000 Mcg Tabs (Cyanocobalamin) .... Take 1 Tab Once Daily 7)  Aspir-Low 81 Mg Tbec (Aspirin) .[name].. 1 By Mouth Once Daily 8)  Advair Diskus 250-50 Mcg/dose Aepb (Fluticasone-Salmeterol) .[name].. 1 Inh Two Times A Day As Needed 9)  Ventolin Hfa 108 (90 Base) Mcg/act Aers (Albuterol Sulfate) .... 2 Puffs Qid As Needed 10)  Bc Headache 325-16-95 Mg Tabs  (Aspirin-Salicylamide-Caffeine) .... As Needed 11)  Carafate 1 Gm Tabs (Sucralfate) .... Take 1 Tablet By Mouth Two Times A Day 12)  Asacol Hd 800 Mg Tbec (Mesalamine) .... Take 4 Tablets By Mouth Daily 13)  Prednisone 20 Mg Tabs (Prednisone) .... Take 40mg  By Mouth Once Daily X 1 Wk, Then 30mg  Once Daily X 1 Wk, Then 20mg  Once Daily X 1 Wk, Then 10mg  Once Daily X 1 Wk, Then Stop 14)  Metformin Hcl 500 Mg Xr24h-Tab (Metformin Hcl) .[name].. 1 By Mouth Qpm 15)  Onetouch Test  Strp (Glucose Blood) .... Test As Directed 16)  Onetouch Lancets  Misc (Lancets) .... Test As Directed  Allergies (verified): 1)  ! Morphine 2)  ! Demerol 3)  ! * Nitroglycerin  Vital Signs:  Patient profile:   77 year old female Height:      65.25 inches (165.74 cm) Weight:      175.25 pounds (79.66 kg) BMI:     29.04 O2 Sat:      98 % on Room air Temp:     97.8 degrees F (36.56 degrees C) oral Pulse rate:   109 / minute BP sitting:   142 / 80  (left arm) Cuff size:   regular  Vitals Entered By: Michel Bickers CMA (May 31, 2010 10:49 AM)  O2 Sat at Rest %:  98 O2 Flow:  Room air CC: Pneumonia follow-up.  Pt says she has a dry cough w/ exertion only.  She is  only using Advair once daily. Prednisone is 10mg  daily. Comments Medications reviewed with patient Michel Bickers CMA  May 31, 2010 11:00 AM   Physical Exam  General:  elderly woman, normal appearance.   Head:  normocephalic and atraumatic Eyes:  conjunctiva and sclera clear Nose:  no deformity, discharge, inflammation, or lesions Mouth:  no deformity or lesions Neck:  no masses, thyromegaly, or abnormal cervical nodes Lungs:  few bibasilar crackles Heart:  irregular, no M Abdomen:  not examined Extremities:  no clubbing, cyanosis, edema, or deformity noted Neurologic:  non-focal Psych:  poor memory, oriented    Impression & Recommendations:  Problem # 1:  OTH SPEC ALVEOL&PARIETOALVEOL PNEUMONOPATHIES (ICD-516.8) COP, improved on CXR and  clinically. Still with some dyspnea, ? deconditioning - finish pred taper  -stop Advair and follow clinically - ROV 2 mo and repeat CXR   Medications Added to Medication List This Visit: 1)  Advair Diskus 250-50 Mcg/dose Aepb (Fluticasone-salmeterol) .[name].. 1 inh two times a day as needed  Other Orders: T-2 View CXR (71020TC) Est. Patient Level IV (16109)  Patient Instructions: 1)  Continue to taper your prednisone as directed by Dr [name] 2)  Stop your Advair 3)  Follow up with Dr Delton Coombes in 2 months or as needed for any problems.

## 2010-06-28 LAB — GLUCOSE, CAPILLARY
Glucose-Capillary: 105 mg/dL — ABNORMAL HIGH (ref 70–99)
Glucose-Capillary: 106 mg/dL — ABNORMAL HIGH (ref 70–99)
Glucose-Capillary: 112 mg/dL — ABNORMAL HIGH (ref 70–99)
Glucose-Capillary: 115 mg/dL — ABNORMAL HIGH (ref 70–99)
Glucose-Capillary: 158 mg/dL — ABNORMAL HIGH (ref 70–99)
Glucose-Capillary: 195 mg/dL — ABNORMAL HIGH (ref 70–99)
Glucose-Capillary: 202 mg/dL — ABNORMAL HIGH (ref 70–99)
Glucose-Capillary: 241 mg/dL — ABNORMAL HIGH (ref 70–99)
Glucose-Capillary: 275 mg/dL — ABNORMAL HIGH (ref 70–99)
Glucose-Capillary: 334 mg/dL — ABNORMAL HIGH (ref 70–99)
Glucose-Capillary: 89 mg/dL (ref 70–99)

## 2010-06-28 LAB — CBC
HCT: 32 % — ABNORMAL LOW (ref 36.0–46.0)
HCT: 33.1 % — ABNORMAL LOW (ref 36.0–46.0)
HCT: 35.1 % — ABNORMAL LOW (ref 36.0–46.0)
Hemoglobin: 10.3 g/dL — ABNORMAL LOW (ref 12.0–15.0)
Hemoglobin: 13.2 g/dL (ref 12.0–15.0)
MCH: 28.3 pg (ref 26.0–34.0)
MCH: 28.3 pg (ref 26.0–34.0)
MCH: 28.5 pg (ref 26.0–34.0)
MCH: 28.6 pg (ref 26.0–34.0)
MCH: 28.7 pg (ref 26.0–34.0)
MCH: 28.7 pg (ref 26.0–34.0)
MCHC: 31.4 g/dL (ref 30.0–36.0)
MCHC: 32 g/dL (ref 30.0–36.0)
MCV: 88.6 fL (ref 78.0–100.0)
MCV: 88.6 fL (ref 78.0–100.0)
MCV: 89.2 fL (ref 78.0–100.0)
MCV: 89.6 fL (ref 78.0–100.0)
MCV: 89.7 fL (ref 78.0–100.0)
MCV: 90 fL (ref 78.0–100.0)
Platelets: 217 10*3/uL (ref 150–400)
Platelets: 222 10*3/uL (ref 150–400)
Platelets: 222 10*3/uL (ref 150–400)
Platelets: 226 10*3/uL (ref 150–400)
Platelets: 243 10*3/uL (ref 150–400)
Platelets: 250 10*3/uL (ref 150–400)
RBC: 3.5 MIL/uL — ABNORMAL LOW (ref 3.87–5.11)
RBC: 3.61 MIL/uL — ABNORMAL LOW (ref 3.87–5.11)
RBC: 3.69 MIL/uL — ABNORMAL LOW (ref 3.87–5.11)
RBC: 3.96 MIL/uL (ref 3.87–5.11)
RBC: 4.62 MIL/uL (ref 3.87–5.11)
RDW: 14.4 % (ref 11.5–15.5)
RDW: 14.7 % (ref 11.5–15.5)
RDW: 14.8 % (ref 11.5–15.5)
WBC: 5.2 10*3/uL (ref 4.0–10.5)
WBC: 6.2 10*3/uL (ref 4.0–10.5)
WBC: 7.5 10*3/uL (ref 4.0–10.5)
WBC: 8.4 10*3/uL (ref 4.0–10.5)

## 2010-06-28 LAB — CULTURE, BLOOD (ROUTINE X 2)
Culture  Setup Time: 201112292019
Culture  Setup Time: 201112292019
Culture: NO GROWTH
Culture: NO GROWTH

## 2010-06-28 LAB — HEMOGLOBIN A1C: Hgb A1c MFr Bld: 6.9 % — ABNORMAL HIGH (ref <5.7)

## 2010-06-28 LAB — COMPREHENSIVE METABOLIC PANEL
Albumin: 3.3 g/dL — ABNORMAL LOW (ref 3.5–5.2)
Alkaline Phosphatase: 75 U/L (ref 39–117)
BUN: 21 mg/dL (ref 6–23)
CO2: 23 mEq/L (ref 19–32)
Chloride: 102 mEq/L (ref 96–112)
Creatinine, Ser: 0.69 mg/dL (ref 0.4–1.2)
GFR calc non Af Amer: >60 mL/min (ref >60)
Glucose, Bld: 137 mg/dL — ABNORMAL HIGH (ref 70–99)
Total Bilirubin: 1.1 mg/dL (ref 0.3–1.2)

## 2010-06-28 LAB — BASIC METABOLIC PANEL
BUN: 18 mg/dL (ref 6–23)
BUN: 22 mg/dL (ref 6–23)
BUN: 29 mg/dL — ABNORMAL HIGH (ref 6–23)
CO2: 23 mEq/L (ref 19–32)
CO2: 26 mEq/L (ref 19–32)
CO2: 26 mEq/L (ref 19–32)
CO2: 28 mEq/L (ref 19–32)
Chloride: 103 mEq/L (ref 96–112)
Chloride: 104 mEq/L (ref 96–112)
Chloride: 105 mEq/L (ref 96–112)
Chloride: 107 mEq/L (ref 96–112)
Creatinine, Ser: 0.61 mg/dL (ref 0.4–1.2)
Creatinine, Ser: 0.73 mg/dL (ref 0.4–1.2)
Creatinine, Ser: 0.86 mg/dL (ref 0.4–1.2)
GFR calc Af Amer: >60 mL/min (ref >60)
Glucose, Bld: 132 mg/dL — ABNORMAL HIGH (ref 70–99)
Glucose, Bld: 154 mg/dL — ABNORMAL HIGH (ref 70–99)
Potassium: 3 mEq/L — ABNORMAL LOW (ref 3.5–5.1)
Potassium: 3.1 mEq/L — ABNORMAL LOW (ref 3.5–5.1)
Potassium: 3.8 mEq/L (ref 3.5–5.1)

## 2010-06-28 LAB — URINALYSIS, ROUTINE W REFLEX MICROSCOPIC
Bilirubin Urine: NEGATIVE
Glucose, UA: NEGATIVE mg/dL
Hgb urine dipstick: NEGATIVE
Specific Gravity, Urine: 1.019 (ref 1.005–1.030)
pH: 6 (ref 5.0–8.0)

## 2010-06-28 LAB — PROTIME-INR
INR: 1.02 (ref 0.00–1.49)
Prothrombin Time: 13.6 seconds (ref 11.6–15.2)

## 2010-06-28 LAB — URINALYSIS, MICROSCOPIC ONLY
Bilirubin Urine: NEGATIVE
Glucose, UA: 250 mg/dL — AB
Ketones, ur: NEGATIVE mg/dL
Protein, ur: NEGATIVE mg/dL
pH: 6 (ref 5.0–8.0)

## 2010-06-28 LAB — LIPID PANEL: Total CHOL/HDL Ratio: 3.2 RATIO

## 2010-06-28 LAB — DIFFERENTIAL
Eosinophils Absolute: 0 10*3/uL (ref 0.0–0.7)
Lymphocytes Relative: 18 % (ref 12–46)
Lymphs Abs: 1.4 10*3/uL (ref 0.7–4.0)
Monocytes Relative: 2 % — ABNORMAL LOW (ref 3–12)
Neutrophils Relative %: 80 % — ABNORMAL HIGH (ref 43–77)

## 2010-06-28 LAB — BRAIN NATRIURETIC PEPTIDE: Pro B Natriuretic peptide (BNP): 46 pg/mL (ref 0.0–100.0)

## 2010-06-28 LAB — URINE MICROSCOPIC-ADD ON

## 2010-06-28 LAB — RHEUMATOID FACTOR: Rhuematoid fact SerPl-aCnc: <20 IU/mL (ref 0–20)

## 2010-06-28 LAB — MRSA PCR SCREENING: MRSA by PCR: NEGATIVE

## 2010-06-28 LAB — LEGIONELLA ANTIGEN, URINE

## 2010-06-28 LAB — URINE CULTURE

## 2010-06-28 LAB — ANA: Anti Nuclear Antibody(ANA): NEGATIVE

## 2010-06-28 LAB — CARDIAC PANEL(CRET KIN+CKTOT+MB+TROPI)
Relative Index: INVALID (ref 0.0–2.5)
Relative Index: INVALID (ref 0.0–2.5)
Total CK: 21 U/L (ref 7–177)
Total CK: 27 U/L (ref 7–177)
Troponin I: 0.03 ng/mL (ref 0.00–0.06)

## 2010-06-28 LAB — ANTI-NEUTROPHIL ANTIBODY

## 2010-06-28 LAB — CK TOTAL AND CKMB (NOT AT ARMC)
CK, MB: 1.2 ng/mL (ref 0.3–4.0)
Relative Index: INVALID (ref 0.0–2.5)

## 2010-06-28 LAB — D-DIMER, QUANTITATIVE: D-Dimer, Quant: 0.91 ug/mL-FEU — ABNORMAL HIGH (ref 0.00–0.48)

## 2010-06-28 LAB — C-REACTIVE PROTEIN: CRP: 5.6 mg/dL — ABNORMAL HIGH (ref <0.6)

## 2010-06-29 LAB — DIFFERENTIAL
Basophils Absolute: 0 10*3/uL (ref 0.0–0.1)
Basophils Absolute: 0.1 10*3/uL (ref 0.0–0.1)
Basophils Relative: 0 % (ref 0–1)
Basophils Relative: 1 % (ref 0–1)
Eosinophils Absolute: 0.3 10*3/uL (ref 0.0–0.7)
Eosinophils Relative: 1 % (ref 0–5)
Lymphocytes Relative: 21 % (ref 12–46)
Lymphs Abs: 1 10*3/uL (ref 0.7–4.0)
Lymphs Abs: 1.8 10*3/uL (ref 0.7–4.0)
Monocytes Absolute: 0.5 10*3/uL (ref 0.1–1.0)
Monocytes Relative: 6 % (ref 3–12)
Neutro Abs: 3.6 10*3/uL (ref 1.7–7.7)
Neutro Abs: 6.1 10*3/uL (ref 1.7–7.7)
Neutrophils Relative %: 67 % (ref 43–77)
Neutrophils Relative %: 76 % (ref 43–77)

## 2010-06-29 LAB — COMPREHENSIVE METABOLIC PANEL
ALT: 11 U/L (ref 0–35)
ALT: 11 U/L (ref 0–35)
AST: 14 U/L (ref 0–37)
AST: 18 U/L (ref 0–37)
AST: 22 U/L (ref 0–37)
Albumin: 1.9 g/dL — ABNORMAL LOW (ref 3.5–5.2)
Albumin: 2 g/dL — ABNORMAL LOW (ref 3.5–5.2)
Albumin: 2.1 g/dL — ABNORMAL LOW (ref 3.5–5.2)
Alkaline Phosphatase: 54 U/L (ref 39–117)
Alkaline Phosphatase: 57 U/L (ref 39–117)
BUN: 6 mg/dL (ref 6–23)
BUN: 7 mg/dL (ref 6–23)
CO2: 22 mEq/L (ref 19–32)
CO2: 26 mEq/L (ref 19–32)
Calcium: 8 mg/dL — ABNORMAL LOW (ref 8.4–10.5)
Calcium: 8.1 mg/dL — ABNORMAL LOW (ref 8.4–10.5)
Calcium: 8.1 mg/dL — ABNORMAL LOW (ref 8.4–10.5)
Chloride: 104 mEq/L (ref 96–112)
Chloride: 109 mEq/L (ref 96–112)
Chloride: 109 mEq/L (ref 96–112)
Creatinine, Ser: 0.77 mg/dL (ref 0.4–1.2)
Creatinine, Ser: 0.8 mg/dL (ref 0.4–1.2)
Creatinine, Ser: 0.8 mg/dL (ref 0.4–1.2)
GFR calc Af Amer: >60 mL/min (ref >60)
GFR calc Af Amer: >60 mL/min (ref >60)
GFR calc Af Amer: >60 mL/min (ref >60)
GFR calc non Af Amer: >60 mL/min (ref >60)
GFR calc non Af Amer: >60 mL/min (ref >60)
Glucose, Bld: 109 mg/dL — ABNORMAL HIGH (ref 70–99)
Glucose, Bld: 190 mg/dL — ABNORMAL HIGH (ref 70–99)
Potassium: 2.8 mEq/L — ABNORMAL LOW (ref 3.5–5.1)
Potassium: 3.2 mEq/L — ABNORMAL LOW (ref 3.5–5.1)
Sodium: 140 mEq/L (ref 135–145)
Sodium: 142 mEq/L (ref 135–145)
Total Bilirubin: 0.1 mg/dL — ABNORMAL LOW (ref 0.3–1.2)
Total Bilirubin: 0.4 mg/dL (ref 0.3–1.2)
Total Bilirubin: 0.4 mg/dL (ref 0.3–1.2)
Total Protein: 4.9 g/dL — ABNORMAL LOW (ref 6.0–8.3)
Total Protein: 5 g/dL — ABNORMAL LOW (ref 6.0–8.3)
Total Protein: 5.5 g/dL — ABNORMAL LOW (ref 6.0–8.3)

## 2010-06-29 LAB — BASIC METABOLIC PANEL
CO2: 25 mEq/L (ref 19–32)
CO2: 27 mEq/L (ref 19–32)
Calcium: 8.5 mg/dL (ref 8.4–10.5)
Chloride: 102 mEq/L (ref 96–112)
Creatinine, Ser: 0.75 mg/dL (ref 0.4–1.2)
GFR calc Af Amer: >60 mL/min (ref >60)
GFR calc Af Amer: >60 mL/min (ref >60)
GFR calc non Af Amer: >60 mL/min (ref >60)
Glucose, Bld: 152 mg/dL — ABNORMAL HIGH (ref 70–99)
Glucose, Bld: 190 mg/dL — ABNORMAL HIGH (ref 70–99)
Potassium: 2.7 mEq/L — CL (ref 3.5–5.1)
Potassium: 4.2 mEq/L (ref 3.5–5.1)
Potassium: 4.3 mEq/L (ref 3.5–5.1)
Sodium: 137 mEq/L (ref 135–145)
Sodium: 139 mEq/L (ref 135–145)

## 2010-06-29 LAB — CBC
HCT: 31.7 % — ABNORMAL LOW (ref 36.0–46.0)
MCH: 30 pg (ref 26.0–34.0)
MCH: 30.6 pg (ref 26.0–34.0)
MCH: 30.9 pg (ref 26.0–34.0)
MCHC: 33.4 g/dL (ref 30.0–36.0)
MCV: 89.8 fL (ref 78.0–100.0)
MCV: 90 fL (ref 78.0–100.0)
Platelets: 247 10*3/uL (ref 150–400)
Platelets: 262 10*3/uL (ref 150–400)
Platelets: 280 10*3/uL (ref 150–400)
Platelets: 318 10*3/uL (ref 150–400)
RBC: 3.29 MIL/uL — ABNORMAL LOW (ref 3.87–5.11)
RBC: 3.43 MIL/uL — ABNORMAL LOW (ref 3.87–5.11)
RBC: 3.53 MIL/uL — ABNORMAL LOW (ref 3.87–5.11)
RBC: 3.73 MIL/uL — ABNORMAL LOW (ref 3.87–5.11)
RDW: 13.6 % (ref 11.5–15.5)
RDW: 14.4 % (ref 11.5–15.5)
WBC: 9.1 10*3/uL (ref 4.0–10.5)

## 2010-06-29 LAB — URINE CULTURE
Colony Count: 45000
Culture  Setup Time: 201111180136
Special Requests: NEGATIVE

## 2010-06-29 LAB — MAGNESIUM
Magnesium: 1.9 mg/dL (ref 1.5–2.5)
Magnesium: 2 mg/dL (ref 1.5–2.5)

## 2010-06-29 LAB — PHOSPHORUS
Phosphorus: 2.6 mg/dL (ref 2.3–4.6)
Phosphorus: 3.2 mg/dL (ref 2.3–4.6)
Phosphorus: 3.3 mg/dL (ref 2.3–4.6)

## 2010-06-29 LAB — GLUCOSE, CAPILLARY
Glucose-Capillary: 173 mg/dL — ABNORMAL HIGH (ref 70–99)
Glucose-Capillary: 181 mg/dL — ABNORMAL HIGH (ref 70–99)
Glucose-Capillary: 230 mg/dL — ABNORMAL HIGH (ref 70–99)
Glucose-Capillary: 243 mg/dL — ABNORMAL HIGH (ref 70–99)
Glucose-Capillary: 256 mg/dL — ABNORMAL HIGH (ref 70–99)
Glucose-Capillary: 358 mg/dL — ABNORMAL HIGH (ref 70–99)

## 2010-06-29 LAB — TSH: TSH: 1.503 u[IU]/mL (ref 0.350–4.500)

## 2010-06-29 LAB — URINALYSIS, ROUTINE W REFLEX MICROSCOPIC
Bilirubin Urine: NEGATIVE
Nitrite: POSITIVE — AB
Specific Gravity, Urine: 1.018 (ref 1.005–1.030)
Urobilinogen, UA: 0.2 mg/dL (ref 0.0–1.0)

## 2010-06-29 LAB — PREALBUMIN
Prealbumin: 6.2 mg/dL — ABNORMAL LOW (ref 18.0–45.0)
Prealbumin: 6.5 mg/dL — ABNORMAL LOW (ref 18.0–45.0)

## 2010-06-29 LAB — TRIGLYCERIDES: Triglycerides: 85 mg/dL (ref <150)

## 2010-06-29 LAB — VIRUS CULTURE: Preliminary Culture: NEGATIVE

## 2010-06-29 LAB — CHOLESTEROL, TOTAL
Cholesterol: 66 mg/dL (ref 0–200)
Cholesterol: 80 mg/dL (ref 0–200)

## 2010-06-29 LAB — URINE MICROSCOPIC-ADD ON

## 2010-06-29 LAB — C-REACTIVE PROTEIN: CRP: 2.6 mg/dL — ABNORMAL HIGH (ref <0.6)

## 2010-07-13 ENCOUNTER — Other Ambulatory Visit: Payer: Self-pay | Admitting: Family Medicine

## 2010-07-13 NOTE — Telephone Encounter (Signed)
RX FAXED TO [phone]     KP

## 2010-07-13 NOTE — Telephone Encounter (Signed)
Last seen 05/28/10 and filled 02/01/10  Please advise     KP

## 2010-07-21 ENCOUNTER — Encounter: Payer: Self-pay | Admitting: Family Medicine

## 2010-07-22 ENCOUNTER — Encounter: Payer: Self-pay | Admitting: Family Medicine

## 2010-07-22 ENCOUNTER — Ambulatory Visit (INDEPENDENT_AMBULATORY_CARE_PROVIDER_SITE_OTHER): Payer: Medicare Other | Admitting: Family Medicine

## 2010-07-22 VITALS — BP 118/70 | Temp 97.3°F | Wt 178.0 lb

## 2010-07-22 DIAGNOSIS — M545 Low back pain: Secondary | ICD-10-CM

## 2010-07-22 DIAGNOSIS — I10 Essential (primary) hypertension: Secondary | ICD-10-CM

## 2010-07-22 DIAGNOSIS — N390 Urinary tract infection, site not specified: Secondary | ICD-10-CM

## 2010-07-22 DIAGNOSIS — F329 Major depressive disorder, single episode, unspecified: Secondary | ICD-10-CM

## 2010-07-22 DIAGNOSIS — E119 Type 2 diabetes mellitus without complications: Secondary | ICD-10-CM

## 2010-07-22 DIAGNOSIS — R7309 Other abnormal glucose: Secondary | ICD-10-CM

## 2010-07-22 DIAGNOSIS — E785 Hyperlipidemia, unspecified: Secondary | ICD-10-CM

## 2010-07-22 LAB — POCT URINALYSIS DIPSTICK
Bilirubin, UA: NEGATIVE
Glucose, UA: NEGATIVE
Nitrite, UA: POSITIVE
Spec Grav, UA: 1.01

## 2010-07-22 MED ORDER — CIPROFLOXACIN HCL 500 MG PO TABS: 500.0000 mg | ORAL_TABLET | Freq: Two times a day (BID) | ORAL | Status: AC

## 2010-07-22 MED ORDER — LORAZEPAM 2 MG PO TABS: 0.5000 mg | ORAL_TABLET | Freq: Three times a day (TID) | ORAL | Status: DC | PRN

## 2010-07-22 NOTE — Progress Notes (Signed)
Subjective:    Patient ID: [name], female    DOB: 03/25/1931, 77 y.o.   MRN: [id number]  HPI Pt here c/o dark urine and back pain since Sunday.  No fever, no blood, no dysuria.   Pt also here for f/u depression --- she feels like she is still so depressed.  It would have been Ralph's birthday this week.  SUBJECTIVE: 77 y.o. female for follow up of diabetes. Diabetic Review of Systems - medication compliance: compliant all of the time, diabetic diet compliance: compliant most of the time, home glucose monitoring: is performed regularly, fasting values range 68-87,further diabetic ROS: no polyuria or polydipsia, no chest pain, dyspnea or TIA's, no numbness, tingling or pain in extremities, last eye exam approximately 3 weeks ago.  She sees Dr Hazle Quant next week..  Other symptoms and concerns: depression and back pain.  Current Outpatient Prescriptions  Medication Sig Dispense Refill  . albuterol (VENTOLIN HFA) 108 (90 BASE) MCG/ACT inhaler Inhale 2 puffs into the lungs 4 (four) times daily.        [name] aspirin 81 MG tablet Take 81 mg by mouth daily.        . Aspirin-Salicylamide-Caffeine (BC HEADACHE) 325-95-16 MG TABS Take 1 tablet by mouth as needed.        . bimatoprost (LUMIGAN) 0.03 % ophthalmic drops Place 1 drop into both eyes daily.        [name] escitalopram (LEXAPRO) 20 MG tablet Take 20 mg by mouth daily.        [name] esomeprazole (NEXIUM) 40 MG capsule Take 40 mg by mouth daily.        . Fluticasone-Salmeterol (ADVAIR DISKUS) 250-50 MCG/DOSE AEPB Inhale 1 puff into the lungs 2 (two) times daily.        [name] glucose blood (ONE TOUCH TEST STRIPS) test strip 1 each by Other route as needed. Use as instructed       . LORazepam (ATIVAN) 2 MG tablet TAKE 1/2 TABLET BY MOUTH AT BEDTIME  30 tablet  0  . Mesalamine (ASACOL HD) 800 MG TBEC Take 4 tablets by mouth daily.        . metFORMIN (GLUCOPHAGE-XR) 500 MG 24 hr tablet Take 500 mg by mouth every evening.        . ONE TOUCH LANCETS MISC 1 Device by  Does not apply route as directed.        . rosuvastatin (CRESTOR) 20 MG tablet Take 20 mg by mouth daily.        . sucralfate (CARAFATE) 1 G tablet Take 1 g by mouth 2 (two) times daily.        . vitamin B-12 (CYANOCOBALAMIN) 1000 MCG tablet Take 1,000 mcg by mouth daily.        [name] MICARDIS HCT 40-12.5 MG per tablet       . predniSONE (DELTASONE) 20 MG tablet Take 20 mg by mouth as directed.          OBJECTIVE: Appearance: alert, well appearing, and in no distress, oriented to person, place, and time, normal appearing weight, well hydrated, anxious and crying. BP 118/70  Temp(Src) 97.3 F (36.3 C) (Oral)  Wt 178 lb (80.74 kg)  Exam: heart sounds S1 and S2 normal, chest clear, no carotid bruits, feet: warm, good capillary refill   Sensory exam of the foot is normal, tested with the monofilament. Good pulses, no lesions or ulcers, good peripheral pulses. Psych--crying ,  Depressed.  Not suicidal No back pain with palpation  abd--soft , non tender ASSESSMENT: Diabetes Mellitus: stable  PLAN: See orders for this visit as documented in the electronic medical record. Issues reviewed with her: home glucose monitoring emphasized, foot care discussed and Podiatry visits discussed, annual eye examinations at Ophthalmology discussed, glycohemoglobin and other lab monitoring discussed and labs to be scheduled.     Review of Systems    as above Objective:   Physical Exam  Constitutional: No distress.        See above  Assessment & Plan:

## 2010-07-22 NOTE — Assessment & Plan Note (Signed)
con't meds  Check labs 

## 2010-07-22 NOTE — Assessment & Plan Note (Signed)
con't Lexapro Gave list of psych for counseling and med management

## 2010-07-22 NOTE — Patient Instructions (Signed)
If back pain returns call for either re evaluation or schedule CT r/o stones Finish abx Schedule appointment with counselor and psych for medication adjustment

## 2010-07-22 NOTE — Assessment & Plan Note (Signed)
Check urine culture Take abx

## 2010-07-22 NOTE — Assessment & Plan Note (Signed)
Check labs  con't diet and exercise 

## 2010-07-22 NOTE — Assessment & Plan Note (Signed)
Check labs con't meds 

## 2010-07-25 LAB — URINE CULTURE: Colony Count: >=100000

## 2010-07-27 ENCOUNTER — Telehealth: Payer: Self-pay

## 2010-07-27 NOTE — Telephone Encounter (Signed)
Pt aware will call if no better. Pt notes that she recently had pneumonia so her breathing is already disrupted but she has not seen any increase since leg swelling. Pt has f/u with pulmonologist at end of month

## 2010-07-27 NOTE — Telephone Encounter (Signed)
Left message to call back     KP 

## 2010-07-27 NOTE — Telephone Encounter (Signed)
It was not the issue when she was here so it was not that bad.  She needs to sit with her legs elevated as much as possible,  Where compression socks or support hose if possible and ov if no better or symptoms worsen.  Any sob ?

## 2010-07-27 NOTE — Telephone Encounter (Signed)
Message copied by Almeta Monas on Tue Jul 27, 2010  1:39 PM ------      Message from: [name]      Created: Sun Jul 25, 2010  6:51 PM       + UTI---treated with cipro

## 2010-07-27 NOTE — Telephone Encounter (Signed)
I spoke w/ pt and she is aware of results. She states that when she was in she was concerned about her feet swelling. She states that Sunday and Monday the swelling was really bad- please advise if there is anything the pt can do.

## 2010-07-27 NOTE — Progress Notes (Signed)
See phone note--   KP 

## 2010-08-02 ENCOUNTER — Other Ambulatory Visit (INDEPENDENT_AMBULATORY_CARE_PROVIDER_SITE_OTHER): Payer: Medicare Other

## 2010-08-02 DIAGNOSIS — E119 Type 2 diabetes mellitus without complications: Secondary | ICD-10-CM

## 2010-08-02 DIAGNOSIS — I10 Essential (primary) hypertension: Secondary | ICD-10-CM

## 2010-08-02 DIAGNOSIS — Z0000 Encounter for general adult medical examination without abnormal findings: Secondary | ICD-10-CM

## 2010-08-02 LAB — HEPATIC FUNCTION PANEL
ALT: 9 U/L (ref 0–35)
AST: 20 U/L (ref 0–37)
Bilirubin, Direct: 0.1 mg/dL (ref 0.0–0.3)
Total Bilirubin: 0.2 mg/dL — ABNORMAL LOW (ref 0.3–1.2)
Total Protein: 6.2 g/dL (ref 6.0–8.3)

## 2010-08-02 LAB — HEMOGLOBIN A1C: Hgb A1c MFr Bld: 6.4 % (ref 4.6–6.5)

## 2010-08-02 LAB — LIPID PANEL
LDL Cholesterol: 87 mg/dL (ref 0–99)
Total CHOL/HDL Ratio: 3
Triglycerides: 114 mg/dL (ref 0.0–149.0)

## 2010-08-02 LAB — BASIC METABOLIC PANEL
BUN: 21 mg/dL (ref 6–23)
CO2: 26 mEq/L (ref 19–32)
Calcium: 9.1 mg/dL (ref 8.4–10.5)
Chloride: 108 mEq/L (ref 96–112)
GFR: 87.56 mL/min (ref >60.00)
Glucose, Bld: 87 mg/dL (ref 70–99)

## 2010-08-03 ENCOUNTER — Encounter: Payer: Self-pay | Admitting: *Deleted

## 2010-08-12 ENCOUNTER — Encounter: Payer: Self-pay | Admitting: Internal Medicine

## 2010-08-12 ENCOUNTER — Ambulatory Visit (INDEPENDENT_AMBULATORY_CARE_PROVIDER_SITE_OTHER): Payer: Medicare Other | Admitting: Internal Medicine

## 2010-08-12 ENCOUNTER — Other Ambulatory Visit (INDEPENDENT_AMBULATORY_CARE_PROVIDER_SITE_OTHER): Payer: Medicare Other

## 2010-08-12 VITALS — BP 124/62 | HR 80 | Ht 65.0 in | Wt 175.0 lb

## 2010-08-12 DIAGNOSIS — K5100 Ulcerative (chronic) pancolitis without complications: Secondary | ICD-10-CM

## 2010-08-12 LAB — CBC WITH DIFFERENTIAL/PLATELET
Basophils Relative: 1.5 % (ref 0.0–3.0)
Eosinophils Absolute: 0.1 10*3/uL (ref 0.0–0.7)
Eosinophils Relative: 1.7 % (ref 0.0–5.0)
Hemoglobin: 11.7 g/dL — ABNORMAL LOW (ref 12.0–15.0)
Lymphocytes Relative: 32.7 % (ref 12.0–46.0)
MCHC: 33.5 g/dL (ref 30.0–36.0)
Monocytes Relative: 4.7 % (ref 3.0–12.0)
Neutro Abs: 3.1 10*3/uL (ref 1.4–7.7)
Neutrophils Relative %: 59.4 % (ref 43.0–77.0)
RBC: 4.2 Mil/uL (ref 3.87–5.11)
WBC: 5.3 10*3/uL (ref 4.5–10.5)

## 2010-08-12 MED ORDER — MESALAMINE 400 MG PO TBEC: DELAYED_RELEASE_TABLET | ORAL | Status: DC

## 2010-08-12 NOTE — Progress Notes (Signed)
[name] [birth year] MRN [id number]        History of Present Illness:  This is a 77 year old white female with recent hospitalization for flareup of ulcerative colitis. She was discharged in December 2012 and has done well. She denies any diarrhea, rectal bleeding or abdominal pain. She is maintained on mesalamine 3.2 g a day last office visit 04/29/2010. She has a history of peptic ulcer disease and partial gastric outlet obstruction on recent upper endoscopy. She is on Nexium 40 mg daily. There's a history of pneumonia, breast cancer, diabetes   Past Medical History  Diagnosis Date  . Hypertension   . Osteoporosis   . COPD (chronic obstructive pulmonary disease)   . Hyperlipidemia   . Anxiety   . Depression   . Breast cancer 13 years ago    s/p tamoxifen   . Peptic ulcer disease   . Hiatal hernia   . Ulcerative colitis   . GERD (gastroesophageal reflux disease)   . Hiatal hernia   . Osteoarthritis   . Diverticulosis   . Esophageal stricture   . Internal hemorrhoids   . Gastric outlet obstruction   . Rheumatic heart disease   . Glaucoma    Past Surgical History  Procedure Date  . Breast surgery     right  . Cataract extraction 08/2006 and 10/2006  . Total knee arthroplasty     left  . Hemorrhoid surgery   . Tonsillectomy   . Abdominal hysterectomy     reports that she has never smoked. She has never used smokeless tobacco. She reports that she does not drink alcohol or use illicit drugs. family history includes Breast cancer in an unspecified family member; Cirrhosis in her brother; Colon cancer (age of onset:67) in her father; Coronary artery disease in her brother and sister; Crohn's disease in her brother; Diabetes in an unspecified family member; Hypertension in an unspecified family member; and Stroke (age of onset:54) in her mother. Allergies  Allergen Reactions  . Meperidine Hcl     REACTION: THROAT SWELLING  . Morphine     REACTION: SWELLING  .  Nitroglycerin         Review of Systems: Positive for shortness of breath, occasional cough, weight gain. Denies abdominal pain rectal bleeding or diarrhea The remainder of the 10  point ROS is negative except as outlined in H&P   Physical Exam: General appearance  Well developed in no distress Eyes- non icteric HEENT nontraumatic, normocephalic Mouth no lesions, tongue papillated, no cheilosis Neck supple without adenopathy, thyroid not enlarged, no carotid bruits, no JVD Lungs Clear to auscultation bilaterally Cor normal S1 normal S2, regular rhythm , no murmur,  quiet precordium Abdomen soft lax abdomen with normal active bowel sounds. No tenderness. No distention. Liver edge and ulcer margin Rectal: Soft Hemoccult negative stool Extremities no pedal edema Skin no lesions Neurological alert and oriented x3 Psychological normal mood and  affect  Assessment and Plan:  Problems #1 ulcerative colitis, in remission. We'll decrease dicyclomine to 2.4 g daily in 2 divided doses. Recheck CBC today. High-fiber diet  Problem #2 primary pulmonary disease followed by Dr Delton Coombes,   08/12/2010 [name]

## 2010-08-12 NOTE — Patient Instructions (Addendum)
We have sent a prescription for Asacol 400 mg (in place of Asacol HD). You should take 3 tablets by mouth 2 times daily Your physician has requested that you go to the basement for the following lab work before leaving today: CBC You will need a follow up appointment with Dr [name] in August 2012

## 2010-08-13 ENCOUNTER — Telehealth: Payer: Self-pay | Admitting: *Deleted

## 2010-08-13 ENCOUNTER — Encounter: Payer: Self-pay | Admitting: Emergency Medicine

## 2010-08-13 NOTE — Telephone Encounter (Signed)
Message copied by Jesse Fall on Fri Aug 13, 2010  8:29 AM ------      Message from: [place], [place]      Created: Thu Aug 12, 2010  8:41 PM       Please call pt with stable H/H.

## 2010-08-13 NOTE — Telephone Encounter (Signed)
Patient notified of results as per Dr. Brodie. 

## 2010-08-16 ENCOUNTER — Ambulatory Visit (INDEPENDENT_AMBULATORY_CARE_PROVIDER_SITE_OTHER)
Admission: RE | Admit: 2010-08-16 | Discharge: 2010-08-16 | Disposition: A | Discharge status: Home / Self Care | Payer: Medicare Other | Source: Ambulatory Visit | Attending: Emergency Medicine | Admitting: Emergency Medicine

## 2010-08-16 ENCOUNTER — Ambulatory Visit (INDEPENDENT_AMBULATORY_CARE_PROVIDER_SITE_OTHER): Payer: Medicare Other | Admitting: Emergency Medicine

## 2010-08-16 ENCOUNTER — Encounter: Payer: Self-pay | Admitting: Emergency Medicine

## 2010-08-16 VITALS — BP 144/88 | HR 88 | Temp 97.9°F | Ht 65.0 in | Wt 177.0 lb

## 2010-08-16 DIAGNOSIS — R0602 Shortness of breath: Secondary | ICD-10-CM

## 2010-08-16 NOTE — Patient Instructions (Signed)
We will check your CXR today Follow up with Dr Delton Coombes next available appointment

## 2010-08-16 NOTE — Assessment & Plan Note (Signed)
Etiology unclear - ? Depression playing a role. Hgb stable - ? Check TSH - CXR today - rov to review CXR assess status

## 2010-08-16 NOTE — Progress Notes (Signed)
  Subjective:    Patient ID: [name], female    DOB: Oct 23, 1930, 77 y.o.   MRN: [id number]  HPI 77 yo woman, never smoker, hx ulcerative colitis, breast CA. Admitted for UTI, A Fib + RVR. During that hospitalization noted to have B infilltrates, was treated empirically for ? pulm edema or PNA w abx. Appearance on film and clinical hx more consistent with COP. Treated with pred 40mg  once daily starting 12/30 w clinical improvement, now follows up.   In retrospect has has dyspnea with exertion evaluated by Dr Laury Axon, Treated beginning in Spring '11 w Advair for ? COPD (never smoked but 2nd hand smoke)  ROV 05/31/10 -- follow up visit for B infiltrates, possible COP. We have been tapering steroids, planning a slightly slower taper for her ulcerative colitis. Breathing is better than last visit, but still some SOB with significant exertion, example shopping all day. Has been taking Advair once daily on most days; has PFT that show mixed disease.   ROV 08/16/10 -- Hx of B infiltrates, probable COP. Has been off pred since March, was doing well off of it. Stopped Advair last visit, did well off of it. She noticed more exertional SOB over the last 2 weeks. Also more allergy sx, with nasal congestion and gtt. Has been treated w abx x 2 since last visit. Some of it may be depression, she is having a hard time living alone with the loss of her husband. Hemoglobin stable on last check by Dr [name]. She injured her R ribs when bending over about 10 days ago. She was having the SOB before the rib injury  Review of Systems As above    Objective:   Physical Exam    Gen: Pleasant, well-nourished, in no distress,  normal affect  ENT: No lesions,  mouth clear,  oropharynx clear, no postnasal drip  Neck: No JVD, no TMG, no carotid bruits  Lungs: No use of accessory muscles, no dullness to percussion, clear without rales or rhonchi  Cardiovascular: RRR, heart sounds normal, no murmur or gallops, no peripheral  edema  Musculoskeletal: No deformities, no cyanosis or clubbing  Neuro: alert, non focal  Skin: Warm, no lesions or rashes     Assessment & Plan:

## 2010-08-31 NOTE — Assessment & Plan Note (Signed)
[facility]                            CARDIOLOGY OFFICE NOTE   NAME:[name], Ashley A                         MRN:          [id number]  DATE:08/29/2006                            DOB:          05-21-1930    PRIMARY CARE PHYSICIAN:  Dr. [name].   INTERVAL HISTORY:  [name] is a delightful 77 year old woman who  returns for further followup on her dyspnea.   She reports that she is feeling much better.  She is trying to get some  exercise and walking some more.  She does use her oxygen every once in a  while.  She recently had a VQ scan which showed no VQ mismatching.  She  did have air trapping consistent with COPD.  She also underwent a  cardiopulmonary exercise test, peak VO2 was 17.9 mL/kg/min which was  112% of predicted with a slope of 30.7 and a peak RER of 1.10.  This  demonstrated normal functional capacities with mild ventilatory  limitation, and O2 sats went from 96% to 93%.   She also reports that she had a very brief syncopal episode when she was  sitting down to go to the bathroom.  She denies any palpitations or  chest pain associated with this.  She has had some similar episodes  where she has felt the room spinning, and she has followed up with Dr.  Laury Axon, and she is pending an MRI.  She did not go to pulmonary rehab,  but is trying to do some more exercises at home.   CURRENT MEDICATIONS:  1. Lorazepam 1 mg a day.  2. Micardis 20 mg a day.  3. Hydrochlorothiazide 12.5 a day.  4. Fosamax.   PHYSICAL EXAMINATION:  She is well appearing in no acute distress.  Blood pressure is 110/82, her pulse is 82, weight is 197.  HEENT:  Normal.  NECK:  Supple.  There is no JVD.  Carotids are 2+ bilaterally without  any bruits.  There is no lymphadenopathy or thyromegaly.  CARDIAC:  Shows a regular rate and rhythm, no murmurs, rubs, or gallops.  LUNGS:  Diminished breath sounds throughout with no wheezing or rales.  ABDOMEN:  Soft,  nontender, nondistended.  No hepatosplenomegaly, no  bruits, no masses appreciated.  EXTREMITIES:  Warm with no cyanosis, clubbing, or edema.  Good distal  pulses.  NEURO:  She is alert and oriented x3.  Cranial nerves II-XII are intact.  She does have minimal dysmetria with her right hand on finger-to-nose  testing, rapid alternating movements are normal.  She has mild  difficulty with tandem gait.   ASSESSMENT AND PLAN:  1. Dyspnea.  This is much improved.  I suspect it is mostly related to      her chronic obstructive pulmonary disease.  I do not think we need      further workup at this point.  I told her that she can use her      oxygen as needed due to her mild desaturation with activity.  We      will see her back  in 6 months for routine followup.  2. Syncope and dizziness.  This sounds like perhaps that she has      benign positional vertigo.  She is currently being worked up by Dr.      Laury Axon, and we will wait to see what they find.  I do not think      there is a significant cardiac etiology.     [name]. Bensimhon, MD     DRB/MedQ  DD: 08/29/2006  DT: 08/29/2006  Job #: [id number]   cc:   Lelon Perla, DO

## 2010-08-31 NOTE — Op Note (Signed)
NAMELeeona, [name]                  ACCOUNT NO.:  [id number]   MEDICAL RECORD NO.:  [id number]          PATIENT TYPE:  INP   LOCATION:  1621                         FACILITY:  [facility]   PHYSICIAN:  Madlyn Frankel. Charlann Boxer, M.D.  DATE OF BIRTH:  May 03, 1930   DATE OF PROCEDURE:  01/02/2007  DATE OF DISCHARGE:                               OPERATIVE REPORT   PREOPERATIVE DIAGNOSIS:  End-stage left knee osteoarthritis.   POSTOPERATIVE DIAGNOSIS:  End-stage left knee osteoarthritis.   PROCEDURE:  Left total knee replacement.   COMPONENTS USED:  DePuy rotating platform posterior stabilized-knee,  size 3 femur, 4 tibia, 38 patellar button and a 10-mm insert.   SURGEON:  Madlyn Frankel. Charlann Boxer, M.D.   ASSISTANT:  Yetta Glassman. Mann, PA   ANESTHESIA:  Regional femoral block plus spinal block.   DRAINS:  X1.   TOURNIQUET TIME:  40 minutes at 250 mmHg.   COMPLICATIONS:  None.   INDICATIONS FOR PROCEDURE:  Ms. Seigler is a 77 year old female who had  been followed in the office for a long period of time for bilateral knee  osteoarthritis, persistent recurring pain, failing conservative measures  of injections, viscous supplementation and medications.  Given her young  age at 50 and wish to remain active with her grandchildren, she wished  to proceed with surgical intervention.  We reviewed the risks of  infection, DVT, component failure, need for revision, persistent pain  postop as well as the postoperative expectations with therapy.  Consent  was obtained.   PROCEDURE IN DETAIL:  The patient was brought to the operative theater.  Once adequate anesthesia and preoperative antibiotics, Ancef,  administered, the patient was positioned supine with a proximal thigh  tourniquet placed.  The left lower extremity pre scrubbed and prepped  and draped in a sterile fashion.  A midline incision was made, followed  by a medial parapatellar arthrotomy.  The patella was subluxated rather  than everted.  Following  the initial debridement, attention was directed  to the patella.  Precut measurement was 23 mm.  I resected down to 14 mm  and used a 38-patellar button with good bony coverage.  Post cut  measurement was 24 mm.   At this point attention was now directed to the femur.  The femoral  canal was opened and irrigated to prevent fat emboli and then  intramedullary rod passed.  It had 5 degrees of valgus to the left knee.  I resected 10 mm of bone off the distal femur.  I then sized the femur,  which was between a size 3 and a 4 and just elevated it slightly for a  size 3.   The anterior-posterior and chamfer cuts were then all made based off the  posterior condylar axis, which matched the perpendicular to [employer].   I then went ahead and finished off the femur, making the box cut based  off the lateral aspect of distal femur.  Attention was now directed to  the tibia.  With tibia subluxation, the meniscus removed and stump of  the PCL debrided.  I  resected 8 mm of bone off the proximal tibia based  on the fact that she had a relatively neutral-position tibia and the  laxity of her tissues.   I checked in with the extensor block and found the knee came out to full  extension.  At this point this cut surface of the tibia was determined  be better fit with a size 4 to get the most surface area coverage.  I  checked the alignment rod and was happy that the cut was perpendicular  through the ankle.  At this point I pinned the tray into position,  drilled and keel punched and then performed a trial reduction with a 3  femur, 4 tibia and a 10-mm insert.  The knee came out to full extension  and a 38 patella tracked within the center of the trochlea without any  subluxation or tilt.   Trial components were removed.  The knee was irrigated with normal  saline solution and pulse lavage.  Final components opened, cement  mixed.  Components were cemented into position and brought out into   extension with a 10-mm spacer.  Extruded cement was removed.  Once the  cement had cured, the trial spacer was removed and the trial insert was  removed, allowing me to remove as much cement as visualized.  Once I was  satisfied that there were no loose fragments of cement, the final 10-mm  insert was placed.  At this point the knee was irrigated again.  I  injected the knee with 0.25% Marcaine with epinephrine and 1 mL of  Toradol, placed a medium Hemovac and let the tourniquet at 40 minutes.  The 5 mL of FloSeal was then injected into the medial and lateral  gutters posteriorly.  The knee was then brought to flexion and extensor  mechanism reapproximated with #1 Vicryl with the knee in flexion.  The  remaining wound was closed with 2-0 Vicryl and a running 4-0 Monocryl.  The knee was cleaned, dried and dressed sterilely with Steri-Strips and  a sterile bulky wrap.  She was brought to the recovery room with ice in  stable condition without complication.      Madlyn Frankel Charlann Boxer, M.D.  Electronically Signed     MDO/MEDQ  D:  01/02/2007  T:  01/02/2007  Job:  [id number]

## 2010-08-31 NOTE — Op Note (Signed)
NAMEAmbermarie, [name]                  ACCOUNT NO.:  [id number]   MEDICAL RECORD NO.:  [id number]          PATIENT TYPE:  INP   LOCATION:  NA                           [facility]ACILITY:  [facility]   PHYSICIAN:  Madlyn Frankel. Charlann Boxer, M.D.  DATE OF BIRTH:  11-01-1930   DATE OF PROCEDURE:  10/23/2007  DATE OF DISCHARGE:                               OPERATIVE REPORT   PREOPERATIVE DIAGNOSIS:  Right knee osteoarthritis.   POSTOPERATIVE DIAGNOSIS:  Right knee osteoarthritis.   PROCEDURE:  Right total knee replacement.   COMPONENTS USED:  DePuy rotating platform, posterior stabilized knee  system, size 4 narrow femur, 3 tibia, 12.5 insert with a 38 patellar  button.   SURGEON:  Madlyn Frankel. Charlann Boxer, M.D.   ASSISTANT:  Yetta Glassman. Mann, PA   ANESTHESIA:  Regional femoral block, associated with a spinal block.   TOURNIQUET TIME:  35 minutes, 250 mmHg.   DRAINS:  Times one Hemovac.   COMPLICATIONS:  None.   INDICATIONS FOR PROCEDURE:  [name] is a 77 year old female patient  of mine, long term with a history of left total knee replacement, done  very well.  She had been dealing with right knee pain, failing  conservative measures and socially was unable to get this thing done  until now.  We reviewed the risks and benefits of the knee replacement,  and she had been through it before.  Consent then was obtained.   PROCEDURE IN DETAIL:  The patient was brought to the operative theater.  Once adequate anesthesia, preoperative antibiotics, Ancef administered,  the patient was positioned supine and proximal thigh tourniquet placed.  The right lower extremity was subsequently prescrubbed and prepped and  draped in a sterile fashion.  The right lower extremity was  exsanguinated, tourniquet elevated to 250 mmHg.  A midline incision was  made, followed by a median arthrotomy.  Following initial debridement,  attention was first directed to the patella.  Precut measurement of 23  mm.  I resected down to 14 mm  using the 38 patellar button that was used  on the other side.  A metal shim was placed to protect the cut surface  of the patellofemoral tract.  Attention was now directed to the femur,  with femoral exposure obtained.  I created a drill hole, irrigated the  canal, before placing the intramedullary rod to prevent fat emboli.  We  then based off the distal femur.  I resected 10 mm of bone at 5 degrees  valgus for this right knee.  I then attended to the tibia, where the  tibia was subluxated anteriorly.  I used an extramedullary guide and  made a perpendicular cut, resecting 10 mm of bone, based off the lateral  thigh.  At this point, I checked with my extension block to check the  balancing.  The leg appeared to be balanced and extension gap fit best  with a 12.5.   At this point, I sized the femur and based on the AP dimension, I  thought the best fit with a size 4, however, medial and lateral  the best  fit was size 3, so we chose a 4 narrow component.   At this point I placed the 5 intramedullary rod into the distal femur  with the cutting block in place, with a 12.5 shim in place.  I based my  rotation femoral component off the cut surface of the tibia.   The pins were placed and anterior stylus had been  placed to prevent  notching.  Once the rotation had been set, the final cut block was  placed and held in position with threaded headed pins.  The anterior-  posterior chamfer cuts were all made, without any notching or  complicating features.  This block appeared to be perpendicular to the  [facility] line on the AP axis.   Following this, I went ahead and made the box cut on the distal femur,  based on the lateral side of the femur.  I then went ahead and prepared  the tibia with a size 3.  I drilled and keel punched it.  At this point  a trial reduction was carried out.  The knee came out full extension  with a 12.5 insert, and the ligaments appeared to be very well balanced   in extension and flexion.  The patella tracked without any application  of pressure and now lets off.  At this point, all trial components are  removed.  The knee was irrigated with normal saline solution, pulse  lavage and I injected the synovial layer with 50 mL of 0.25% Marcaine  with epinephrine and 1 mL of Toradol.   The wounds were opened and cement mixed.   The final components were then cemented in position with a 12.5 insert  placed.  The trial insert placed.  The knee was brought out to  extension.  Extruded cement was removed.  Once the knee had cured, I  removed the remaining cement.  Once I was satisfied I was unable to  visualize any remaining cement in the medial and lateral gutters or  posteriorly, the final 12.5 instrument was punched.  We reirrigated the  knee out, placed a medium Hemovac drain in the suprapatellar pouch.  The  knee was then brought to flexion.  Extension mechanism reapproximated  using #1 Vicryl.  The remaining wound was closed with 2-0 Vicryl and  with 4-0 running Monocryl.  The knee was cleaned, dried and dressed  sterilely with Steri-Strips and sterile bulky wrap.  She was brought to  the recovery room in stable condition, tolerating the procedure well.      Madlyn Frankel Charlann Boxer, M.D.  Electronically Signed     MDO/MEDQ  D:  10/23/2007  T:  10/23/2007  Job:  [id number]

## 2010-08-31 NOTE — Assessment & Plan Note (Signed)
[facility]                         GASTROENTEROLOGY OFFICE NOTE   NAME:[name], [name]                         MRN:          [id number]  DATE:05/02/2007                            DOB:          08/14/1930    [name] is [name] 77 year old white female who developed epigastric pain  about [name] month ago and called our office.  We have doubled up on her  Prilosec from 20 mg today to 20 mg twice [name] day with [name] marked improvement  of her symptoms.  We have known [name] for at least 10 years with  history of peptic ulcer disease, duodenal ulcer in 2006 status post  upper GI bleed.  On upper endoscopy in June 2006 she was found to have [name]  duodenal stricture as well as duodenal ulcer.  She also at that time had  esophageal stricture.  She has [name] history of breast carcinoma, status  post right mastectomy with reconstruction.  She also has [name] family  history of colon cancer in [name] direct relative, underwent prior  colonoscopies 1996 and the last one in April 2002 with findings of  diverticulosis of the left colon with question of [name] colitis.  On initial  colonoscopy in 1996 mucosal biopsies showed mild chronic colitis, but it  was not reproduced on repeat colonoscopy in 2002.  She has been under [name]  great deal of stress because her husband has been on dialysis and she  has to take care of him.  She is just getting over an upper respiratory  infection which was treated with [name] course of Z-Pak, she is over the  antibiotics.  Patient had [name] left knee replacement this year by Dr. Charlann Boxer,  she is doing well from that standpoint.   MEDICATIONS:  1. Micardis HCT 40 mg p.o. daily.  2. Lexapro 10 mg daily.  3. Lorazepam 1 mg daily.  4. Prilosec 20 mg p.o. daily.  5. Iron supplements daily.  6. Calcium with vitamin D.  7. Boniva monthly.  8. Crestor 10 mg daily.  9. Eye drops.   PHYSICAL EXAMINATION:  Blood pressure 120/70, pulse 80 and weight 175  pounds which represents 20  pounds weight loss in the last 3 months.  She  appeared healthy, in no distress.  Sclerae was nonicteric, oral cavity  was normal.  NECK:  Supple, no adenopathy.  LUNGS:  Clear to auscultation.  COR:  With normal S1, normal S2.  ABDOMEN:  Soft but tender in the epigastrium.  There was normal left and  right upper quadrant with liver edge at costal margin.  Lower abdomen  was normal.  RECTAL:  Exam not done.  EXTREMITIES:  No edema.  She had scar on the left knee, status post left  knee replacement.   IMPRESSION:  64. [name] 77 year old white female with history of duodenal ulcer and      duodenal stricture, now with  which responded to an increased dose      of proton pump inhibitor, should rule out recurrent duodenal ulcer      or stricture.  2.  Family history of colon cancer in [name] direct relative.  Patient is      due for repeat colonoscopy.  3. Weight loss, may be related to early satiety, decreased appetite      and stress.   PLAN:  1. Upper and lower endoscopy both scheduled using routine colonoscopy      prep.  2. Patient requests increase on her lorazepam from 1 mg to 2 mg at      bedtime.  She takes it only once [name] day for sleep.  3. Continue Prilosec 20 mg once [name] day, will plan to adjust her dosage      depending on the findings of the upper endoscopy.     [name]. Ashley Chance, MD  Electronically Signed    DMB/MedQ  DD: 05/02/2007  DT: 05/02/2007  Job #: [id number]   cc:   Lelon Perla, DO

## 2010-08-31 NOTE — H&P (Signed)
NAMEInas, [name]                  ACCOUNT NO.:  [id number]   MEDICAL RECORD NO.:  [id number]          PATIENT TYPE:  INP   LOCATION:  NA                           FACILITY:  [facility]   PHYSICIAN:  Madlyn Frankel. Charlann Boxer, M.D.  DATE OF BIRTH:  1930/08/23   DATE OF ADMISSION:  01/02/2007  DATE OF DISCHARGE:                              HISTORY & PHYSICAL   PROCEDURE:  Left total knee arthroplasty.   CHIEF COMPLAINT:  Left knee pain.   HISTORY OF PRESENT ILLNESS:  This 77 year old female with a history of  left knee pain, secondary to osteoarthritis.  She has osteoarthritis,  which is worse in the patellofemoral joint compartment.  She had been  scheduled for surgery in the past, as she has failed all conservative  treatments.  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 Corliss Skains for percutaneous-type technique.  In addition, she is  being pre-surgically assessed by Dr. [name] and Dr. Arvilla Meres.   PAST MEDICAL HISTORY:  Includes:  1. Osteoarthritis.  2. Hypertension.  3. Reflux disease.  4. Rheumatic heart disease.  5. Breast cancer.   FAMILY MEDICAL HISTORY:  Her past surgical history includes  tonsillectomy, hemorrhoidectomy, hysterectomy, right breast removal,  mastectomy 60 years ago, vertebroplasty February 2007, cataract surgery  both eyes.   FAMILY HISTORY:  Significant for liver disease, diabetes, cancer,  stroke, lung disease.   SOCIAL HISTORY:  The patient is married.  Primary caregivers after  surgery will be husband and daughter.   DRUG ALLERGIES:  Morphine, Demerol.   MEDICATIONS:  1. Micardis HCTZ 40 mg one p.o. daily.  2. Lexapro 10 mg p.o. daily.  3. Lorazepam 1 mg one p.o. daily.  4. Omeprazole 20 mg one p.o. daily.  5. Crestor 10 mg one p.o. daily.  6. Boniva, one month.  7. Iron over-the-counter.  8. Vitamin B12 over-the-counter.  9. Lumigan, 1 drop each eye at night.   REVIEW OF SYSTEMS:  NEUROLOGY:  Has history of  migraines, anxiety.  CARDIOVASCULAR:  Shortness of breath during exertion, as well as history  of rheumatic heart disease.  GASTROINTESTINAL:  Has hiatal hernia,  gastric ulcers.  HEMATOLOGY:  Unusual fatigue and easily bruised.  Otherwise, see HPI.   Neurology recently treated for basilar aneurysm.  Otherwise, see HPI.   PHYSICAL EXAM:  VITAL SIGNS:  Pulse 64, respirations 18, blood pressure  110/74, left arm.  GENERAL:  Awake, alert and oriented, well-developed, well-nourished, no  acute distress.  NECK:  Supple.  No carotid bruits.  CHEST/LUNGS:  Clear to auscultation bilaterally.  BREASTS:  Deferred.  HEART:  Regular rate and rhythm without gallops, clicks, rubs or  murmurs.  ABDOMEN:  Soft, nontender, nondistended.  Bowel sounds present.  GENITOURINARY:  Deferred.  EXTREMITIES:  Left lower extremity has full extension and flexion to  120+.  SKIN:  No cellulitis.  NEUROLOGIC:  Intact sensibilities.   LABS:  EKG, chest x-ray pending, pre-surgical testing.   IMPRESSION:  1. Osteoarthritis.  2. Hypertension.  3. Reflux disease.  4. Rheumatic heart disease.  5. Breast cancer.   PLAN OF ACTION:  Left total knee arthroplasty.  Risks and complications  were discussed.  Questions were encouraged, answered and reviewed.   No postoperative prescriptions given at time of history and physical, as  skilled nursing facility rehab placement may be necessary after surgery.  She is unable to take Celebrex, due to prior history of gastric  problems.     ______________________________  Yetta Glassman [address], [place]      Madlyn Frankel. Charlann Boxer, M.D.  Electronically Signed    BLM/MEDQ  D:  12/25/2006  T:  12/26/2006  Job:  [id number]   cc:   Lelon Perla, DO  [address] [place], [place] [address]   [name]. Bensimhon, MD  [address], [place] [address]

## 2010-08-31 NOTE — Discharge Summary (Signed)
[name], [name]                  ACCOUNT NO.:  [id number]   MEDICAL RECORD NO.:  [id number]          PATIENT TYPE:  INP   LOCATION:  1611                         FACILITY:  [facility]   PHYSICIAN:  Madlyn Frankel. Charlann Boxer, M.D.  DATE OF BIRTH:  03-22-1931   DATE OF ADMISSION:  10/23/2007  DATE OF DISCHARGE:  10/26/2007                               DISCHARGE SUMMARY   ADMITTING DIAGNOSIS:  1. Osteoarthritis.  2. Hypertension.  3. Reflux disease.  4. Rheumatic heart disease.  5. Breast cancer.   DISCHARGE DIAGNOSIS:  1. Osteoarthritis.  2. Hypertension.  3. Reflux disease.  4. Rheumatic heart disease.  5. Breast cancer.  6. Postoperative hypokalemia.   HISTORY OF PRESENT ILLNESS:  A 77 year old female with history of right  knee pain secondary to osteoarthritis, refractory to all conservative  treatment.   CONSULTATION:  None.   PROCEDURE:  As right total knee arthroplasty by surgeon, Dr. Durene Romans.  Assistant:  Dwyane Luo PA-C.   LABORATORY DATA:  Labs:  Preadmission CBC showed hemoglobin/hematocrit  to be 13.9 and 40.6 and platelets 266.  At time of discharge, hemoglobin  9.4, hematocrit 27.3, platelets 203.  White cell differential all within  normal limits on admission.  At time of discharge, no significant  abnormalities.  Coagulation all within normal limits.  INR was 0.9.  Routine chemistry on admission:  Sodium 141, potassium 3.9, glucose 99  and creatinine 0.63.  She did have some elevated potassium on the 9th of  3.4, treated.  Discharge chemistries were sodium 130, potassium 3.6,  glucose 127, creatinine 0.57.  Kidney function normal with calcium 8.5  at discharge.  UA preadmission was negative for nitrates but did show  moderate leukocyte esterase and some calcium oxalate crystals.   An EKG showed normal sinus rhythm.   RADIOLOGY:  No chest x-ray found on chart.   HOSPITAL COURSE:  The patient underwent right total knee replacement and  remained on the orthopedic  floor.  She remained afebrile throughout her  course of stay.  Other than some very mild postoperative hypokalemia,  she remained hemodynamically stable.  Dressing was changed on a daily  basis with no significant drainage from the wound.  She was  neurovascularly intact of her right lower extremity.  Made adequate  progress with physical therapy; able to ambulate at least 100 feet with  the use of rolling walker.  Seen on day three, she was afebrile,  dressing was dry, and wound was clean, dry and intact.  Quads were  firing; although she was unable to perform straight leg raise at the  time, she did have good quad function.  She was ready for discharge  home.   DISCHARGE DISPOSITION:  Discharged home with home health care PT in  stable and improved condition.   DISCHARGE PHYSICAL THERAPY:  Weightbearing as tolerated with the use of  rolling walker.   DISCHARGE DIET:  Regular.   DISCHARGE WOUND CARE:  Keep dry.   DISCHARGE MEDICATIONS:  1. Lovenox 40 mg subcu every 24 x 11 days.  2. Robaxin 500  mg p.o. every 6 muscle spasm.  3. Iron 325 mg p.o. t.i.d. x2 weeks.  4. Enteric-coated aspirin 325 mg p.o. daily x4 weeks after Lovenox      completed.  5. Colace 100 mg p.o. b.i.d.  6. MiraLax 17 grams p.o. daily.  7. Vicodin 5/325 one to two p.o. every 4-6 p.r.n. pain.  8. Micardis 40 mg 1 p.o. q.a.m.  9. Lexapro 10 mg 1 p.o. q.a.m.  10.Lorazepam 10 mg 1 p.o. q.p.m.  11.Omeprazole 20 mg 1 p.o. q.a.m.  12.Crestor 10 mg 1 p.o. q.a.m.  13.Lumigan 10 mg 1 drop each eye q.h.s.  14.Boniva 1 monthly.  15.Aspirin 81 mg daily.  16.B complex daily.  17.B12 1,000 mcg daily.   DISCHARGE FOLLOWUP:  Follow up with Dr. Charlann Boxer at phone number __________  in 2 weeks for wound check.     ______________________________  Yetta Glassman. [address], [place]      Madlyn Frankel. Charlann Boxer, M.D.  Electronically Signed    BLM/MEDQ  D:  12/03/2007  T:  12/03/2007  Job:  [id number]   cc:   Lelon Perla, DO  [address] [place], [place] [address]

## 2010-09-02 ENCOUNTER — Encounter: Payer: Self-pay | Admitting: Emergency Medicine

## 2010-09-03 NOTE — Discharge Summary (Signed)
NAMEShanaye, Rief Nykeria                  ACCOUNT NO.:  [id number]   MEDICAL RECORD NO.:  [id number]          PATIENT TYPE:  INP   LOCATION:  3703                         FACILITY:  [facility]   PHYSICIAN:  Rene Paci, M.D. LHCDATE OF BIRTH:  [birth year]   DATE OF ADMISSION:  10/06/2004  DATE OF DISCHARGE:  10/08/2004                                 DISCHARGE SUMMARY   DISCHARGE DIAGNOSES:  1.  Acute upper gastrointestinal bleed secondary to chronic NSAID-induced      peptic ulcer and erosions.  2.  Anemia with acute blood loss secondary to #1.  3.  Gastroesophageal reflux disease.   HISTORY OF PRESENT ILLNESS:  The patient is a 77 year old female who was  admitted with a history of diarrhea x24 hours and also bright red blood per  rectum.  The patient denied nausea, vomiting, or fever at time of admission.  The patient reported positive weakness and diaphoresis on the day of  admission.  The patient was admitted for further evaluation.   PAST MEDICAL HISTORY:  1.  Rheumatic fever x3 as a child.  2.  Depression.  3.  Hypertension.  4.  Osteoarthritis.  5.  Anxiety.  6.  Gastroesophageal reflux disease.  7.  Status post hysterectomy at age 31.  67.  Status post right mastectomy 1995 with reconstruction in 2000 for breast      cancer.  The patient is status post five years Tamoxifen.  9.  Tonsillectomy as a child.  10. Multiple knee steroid injections.  11. Hemorrhoidectomy in 1971.   HOSPITAL COURSE:  The patient was admitted for GI bleed.  A GI consult was  obtained.  The patient underwent an endoscopy performed by Dr. Russella Dar.  Endoscopy revealed a duodenal ulcer which was acute without hemorrhage.  Next, duodenal stricture was noted.  Hernia, positive hiatal hernia,  esophageal stricture, and gastritis was noted on EGD report.  The patient  was given two units of packed red blood cells secondary to anemia.  At the  time of discharge, the patient's hemoglobin is 10.4.  Yesterday,  hemoglobin  was 10.2.  Hemoglobin remained stable.   The patient is to follow up with Dr. [name] at [facility] next  week on June 30 for an EGD with esophageal dilation.   DISCHARGE MEDICATIONS:  1.  Potassium.  2.  Lexapro.  3.  Hydrochlorothiazide.  4.  Micardis.  5.  Lorazepam.   The patient is to resume preadmission home dosing of all above meds.  In  addition, the patient is not to take Motrin or any aspirin products.   DISCHARGE LABORATORIES:  Hemoglobin 10.4, hematocrit 30.4.   FOLLOW UP:  The patient is to follow up with Dr. [name] October 15, 2004.  In  addition, the patient has a followup with Dr. Laury Axon on June 29 at 1:45  p.m.  for followup CBC and post hospitalization visit.       MSO/MEDQ  D:  10/08/2004  T:  10/08/2004  Job:  [id number]   cc:   Dr. Laury Axon

## 2010-09-03 NOTE — H&P (Signed)
NAMEGENIVA, LOHNES NO.:  [id number]   MEDICAL RECORD NO.:  [id number]          PATIENT TYPE:  LINP   LOCATION:                               FACILITY:  [facility]   PHYSICIAN:  Madlyn Frankel. Charlann Boxer, M.D.  DATE OF BIRTH:  [birth year]   DATE OF ADMISSION:  10/23/2007  DATE OF DISCHARGE:                              HISTORY & PHYSICAL   She is being admitted on October 23, 2007 for right total knee arthroplasty.   CHIEF COMPLAINTS:  Right knee pain.   HISTORY OF PRESENT ILLNESS:  A 77 year old female with a history of  right knee pain secondary to osteoarthritis.  It has been refractory to  all conservative treatment.  She does have a history of left total knee  replacement in September 2008.  She has done very well with no  complications.   PRIMARY CARE PHYSICIAN:  Dr. [name].   PAST MEDICAL HISTORY:  Significant for:  1. Osteoarthritis.  2. Hypertension.  3. Reflux disease.  4. Rheumatic heart disease.  5. Breast cancer.   PAST SURGICAL HISTORY:  1. Left total knee replacement 2008.  2. Hysterectomy.  3. Breast cancer.   FAMILY HISTORY:  Heart disease, cancer, stroke.   SOCIAL HISTORY:  Married 55 years.  Primary caregiver after surgery will  be husband at home.   DRUG ALLERGIES:  MORPHINE AND DEMEROL CAUSE HER MOUTH AND THROAT TO  SWELL.   MEDICATIONS:  1. Micardis HCTZ 40 mg one p.o. daily.  2. Lexapro 10 mg, one p.o. daily.  3. Lorazepam 1 mg, one p.o. daily.  4. Omeprazole 20 mg p.o. daily.  5. Boniva once a month.  6. Crestor 10 mg, one p.o. daily.  7. Lumigan, 1 drop each eye every night.   HISTORY OF PRESENT ILLNESS:  See HPI.   PHYSICAL EXAMINATION:  VITAL SIGNS:  Pulse 64, respirations 18, blood  pressure 132/78.  GENERAL:  Awake, alert and oriented, well-developed, well-nourished, no  acute distress.  NECK: Supple.  No carotid bruits.  CHEST/LUNGS:  Clear to auscultation bilaterally.  BREASTS:  Deferred.  HEART:  Regular rate and  rhythm.  S1-S2 distinct.  ABDOMEN:  Soft, nontender, bowel sounds present.  GENITOURINARY:  Deferred.  EXTREMITIES:  Right lower extremity has positive dorsalis pedis pulse.  She comes out to  near full extension, flexion back to 120 degrees.  SKIN:  Intact.  No cellulitis.  NEUROLOGIC:  Intact distal sensibilities.   Labs, EKG, chest x-ray all pending presurgical testing.   IMPRESSION:  Right knee osteoarthritis.   PLAN OF ACTION:  Right total knee arthroplasty [facility],  October 23, 2007 by surgeon Dr. Durene Romans.  Risks and complications were  discussed.   Postoperative medications including Lovenox, Robaxin iron, aspirin,  Colace, MiraLax provided at time of history and physical.  Pain  medicines will be provided at time of surgery.     ______________________________  Yetta Glassman [address], [place]      Madlyn Frankel. Charlann Boxer, M.D.  Electronically Signed    BLM/MEDQ  D:  10/04/2007  T:  10/04/2007  Job:  [id number]   cc:   Lelon Perla, DO  [address] [place], [place] [address]

## 2010-09-03 NOTE — Op Note (Signed)
[name], [name]                            ACCOUNT NO.:  [id number]   MEDICAL RECORD NO.:  [id number]                   PATIENT TYPE:  EMS   LOCATION:  URG                                  FACILITY:  [facility]   PHYSICIAN:  Dionne Ano. Everlene Other, M.D.         DATE OF BIRTH:  1931/02/17   DATE OF PROCEDURE:  DATE OF DISCHARGE:                                 OPERATIVE REPORT   No dictation for this job.  Just added CC.                                               Dionne Ano. Everlene Other, M.D.    [name]  D:  04/27/2003  T:  04/27/2003  Job:  [id number]   cc:   Rosalyn Gess. Norins, M.D. [facility]

## 2010-09-03 NOTE — Op Note (Signed)
NAMEKeyira, [name]                            ACCOUNT NO.:  [id number]   MEDICAL RECORD NO.:  [id number]                   PATIENT TYPE:  EMS   LOCATION:  URG                                  FACILITY:  [facility]   PHYSICIAN:  Dionne Ano. Everlene Other, M.D.         DATE OF BIRTH:  18-Apr-1931   DATE OF PROCEDURE:  04/27/2003  DATE OF DISCHARGE:                                 OPERATIVE REPORT   HISTORY OF PRESENT ILLNESS:  I had the pleasure to see [name] in the  urgent care system at [facility] upon [name] counter-referral from [place]  C. Sydnee Levans, M.D., in regards to her right upper extremity predicament.  This patient is 77 years of age.  She was doing quite well and in her normal  state of affairs until she fell without loss of consciousness today, April 27, 2003, on Sunday.  She is right-hand dominant and sustained [name] fracture to  the right distal radius.  The patient denies numbness or tingling.  She  denies loss of consciousness, fever, chills, or abdominal pain.  She denies  lower extremity pain or left upper extremity pain.   She is here today with her husband of 50 years and notes no significant  problems on review of systems.   ALLERGIES:  DEMEROL and MORPHINE (she has tolerated Ultram, Tylenol, and  Vicodin today, I should note).   MEDICATIONS:  1. Micardis 40 mg one daily.  2. Ranitidine 150 mg one daily.  3. Ativan 1 mg daily.  4. Hydrochlorothiazide 12.5 mg one p.o. daily.   PAST MEDICAL HISTORY:  She denies significant past medical problems, except  for difficulty swallowing which has required balloon dilation of her  esophagus x 2.  She does have [name] history of breast cancer without metastases.   PAST SURGICAL HISTORY:  1. Breast mastectomy 10 years ago with subsequent reconstruction five years     ago.  2. Hysterectomy.  3. Hemorrhoid surgery.   SOCIAL HISTORY:  She does not smoke or drink.  She works in Airline pilot.   PHYSICAL EXAMINATION:  GENERAL APPEARANCE:   The patient is alert and  oriented and in no acute distress.  She is cooperative with the exam.  VITAL SIGNS:  She is afebrile.  Her vital signs are stable.  EXTREMITIES:  She has normal refill in the tips of her fingers with obvious  deformity about the right radius.  The lower extremity examination is  without abnormality.  She has normal alignment, stability, range of motion,  and sensation about the lower extremities.  Here knee and hip exams are  negative for fracture, dislocation, or space occupying lesion.  The left  upper extremity is neurovascularly intact.  __________  The right upper  extremity is nontender to the elbow.  Her shoulder is mildly tender, but  without deformity.  She has obvious deformity, soft tissue swelling, and  pain over  the distal radius.  I have reviewed this at length and her  findings.  NECK AND BACK:  Nontender.  ABDOMEN:  Nontender.  PELVIS:  Stable.   LABORATORY DATA:  X-rays were viewed which show [name] comminuted metaphyseal  distal radius fracture with displacement.   IMPRESSION:  Closed comminuted distal radius fracture with metaphyseal  involvement and no intra-articular derangement.   PLAN:  I have discussed with the patient our findings and treatment options.  After throughout and thoughtful discussion, we consented her for hematoma  block and closed reduction.   The patient was given [name] hematoma block and following this was placed in  finger trap traction.  She underwent manipulative reduction without  difficulty.  Following this, [name] sugar-tong splint was applied and 3-point  mold obtained.  X-rays after this showed excellent parameters, including  radial height and inclination of volar tilt.  I discussed with her the  issues at hand and discussed that this could fall off.  Should it fall off,  we would then of course recommend ORIF.  At the present time, she looks  quite well.  We will monitor her carefully in the outpatient arena.  I   discussed her Vicodin which she tolerated well today, as well as Ultracet.  Will have her use her regular Ativan for anxiety.  Prescribed Robaxin for  muscle spasm.  She will elevate and move the fingers frequently.  She will  notify us should any problems, questions, or concerns arise.  She tolerated  the procedure today without difficulty.  I should note that an AP was taken  of her ring finger about the hand as she had some pain and these x-rays were  negative.  All questions have been encouraged and answered.                                               Dionne Ano. Everlene Other, M.D.    [name]  D:  04/27/2003  T:  04/27/2003  Job:  [id number]   cc:   Lilyan Punt. Sydnee Levans, M.D.   Rosalyn Gess Norins, M.D.

## 2010-09-03 NOTE — Assessment & Plan Note (Signed)
[name]                            CARDIOLOGY OFFICE NOTE   NAME:[name]                         MRN:          [id number]  DATE:07/04/2006                            DOB:          02-07-1931    PRIMARY CARE PHYSICIAN:  Lelon Perla, D.O.   INTERVAL HISTORY:  [name] is [name] delightful 77 year old woman who  returns for further followup on her dyspnea.   She first came to clinic in February 2008, complaining of [name] 63-month  history of dyspnea on exertion.  At that time, we had her walk in the  hall with the nurse, and apparently her O2 saturation dropped to 78%.  She underwent right and left heart catheterization which showed normal  coronary arteries, with normal LV function.  There was very mild  pulmonary hypertension, with [name] mean pressure of 27, with [name] normal  pulmonary vascular resistance.  She underwent [name] CT scan of the chest  which showed no evidence of pulmonary emboli.  There was [name] large hiatal  hernia and some mild COPD.  This was followed by [name] V/Q scan, which was  done to rule out chronic PE.  This was negative for pulmonary emboli and  showed air trapping consistent with COPD.  Pulmonary function tests  showed [name] very mild restrictive lung defect.  There was no evidence of  obstruction or decrease in diffusion capacity.  Echocardiogram showed  normal LV function.  The left side of her heart was mildly enlarged,  with minimally elevated right-sided pressures.   She returns today saying that she is still somewhat short of breath with  exertion. We did put her on home oxygen, and she feels like this is  helping significantly.  She denies any chest pain.  She does have  occasional dizziness, but not sustained, and also occasional brief  palpitations.  Denies any cyanosis.   PAST MEDICAL HISTORY:  1. Hypertension.  2. Hyperlipidemia.  3. History of rheumatic fever.  4. Anxiety.  5. Depression.  6. History of breast cancer,  status post right mastectomy and      tamoxifen therapy 13 years ago.   CURRENT MEDICATIONS:  1. Micardis HCTZ 40/25.  2. Lexapro 10.  3. Lorazepam 1 mg daily.  4. Prilosec 20 [name] day.  5. Calcium.  6. Iron.  7. Boniva.  8. Crestor 10 [name] day.   PHYSICAL EXAMINATION:  GENERAL:  She is well appearing, in no acute  distress.  VITAL SIGNS:  I walked her for over 5 minutes in the hall without her  oxygen.  She started out at [name] saturation of 98% and kept her saturations  predominantly in the 94%-95% range, however did at times drop in the 92%-  93% range, but never dropped below 90%.  Blood pressure is 118/82, heart  rate 76, weight is 195.  HEENT:  Sclerae anicteric.  EOMI.  There is no xanthelasma.  Mucous  membranes are moist.  NECK:  Supple.  No JVD.  Carotids are 2+ bilaterally.  No bruits.  There  is no lymphadenopathy or  thyromegaly.  CARDIAC:  Regular rate and rhythm.  Soft systolic ejection murmur at the  left sternal border.  No rub or gallop.  LUNGS:  Clear.  ABDOMEN:  Soft, nontender, nondistended.  No hepatosplenomegaly.  No  bruits.  No masses appreciated.  Good bowel sounds.  EXTREMITIES:  Warm, with no cyanosis, clubbing, or edema.  NEUROLOGIC:  Alert and oriented x3.  Cranial nerves II-XII are intact.  Moves all four extremities without difficulty.  Affect is bright.   ASSESSMENT AND PLAN:  1. Dyspnea on exertion.  She continues to have fairly significant      dyspnea.  However, in walking her today, she does drop her      saturations slightly, but not like previously, and I suspect that      the initial value we get may have been artifactual.  At this point,      my concern over [name] significant pulmonary problem is much decreased.      I do think she may have some mild intrinsic lung disease and, given      her mild desaturations, I think she would benefit from pulmonary      rehab.  We will proceed with [name] cardiopulmonary exercise test to      further evaluate her  functional capacity and the etiology of her      limitation. I  also suggested that deconditioning may be playing [name]      role here, and thought that pulmonary rehab may help significantly      with this.  We will see her back soon for followup.     [name]. Bensimhon, MD  Electronically Signed    DRB/MedQ  DD: 07/04/2006  DT: 07/05/2006  Job #: [id number]   cc:   Lelon Perla, DO

## 2010-09-03 NOTE — Discharge Summary (Signed)
NAMESumiye, [name]                  ACCOUNT NO.:  [id number]   MEDICAL RECORD NO.:  [id number]          PATIENT TYPE:  INP   LOCATION:  1621                         FACILITY:  [facility]   PHYSICIAN:  [name]. [name], M.D.  DATE OF BIRTH:  Jun 08, 1930   DATE OF ADMISSION:  01/02/2007  DATE OF DISCHARGE:  01/06/2007                               DISCHARGE SUMMARY   ADMITTING DIAGNOSES:  1. Osteoarthritis.  2. Hypertension.  3. Reflux.  4. Migraine.  5. History of breast cancer.  6. Hiatal hernia.  7. Rheumatic heart disease.   DISCHARGE DIAGNOSES:  1. Osteoarthritis.  2. Hypertension.  3. Reflux disease.  4. Breast cancer.  5. Migraine.  6. Rheumatic heart disease.  7. Hiatal hernia.  8. Anxiety.   HISTORY OF PRESENT ILLNESS:  This is a 77 year old female with a history  of persistent progressive bilateral knee osteoarthritis, the left has  been worse than the right here at the end of her course.  It has been  refractory to all conservative treatments.  She was pre surgically  assessed prior to surgery.   LABORATORY DATA:  Presurgical labs, CBC with a hematocrit of 38, at  discharge 26.9 and stable.  Coagulation normal.  Routine chemistry, no  significant abnormalities and stable at discharge.  GI workup normal.  Kidney function normal with calcium 8.7 at discharge.  UA preadmission  showed uric acid crystals and small leukocyte esterase and many  bacteria, treated.   Cardiology, EKG normal sinus rhythm.  Cleared cardiology.   HOSPITAL COURSE:  The patient underwent a left total knee replacement  and tolerated the procedure well, was admitted to the orthopedic floor.  The pain was well-controlled throughout her course of stay.  She  remained afebrile throughout.  She remained neurovascular intact to the  left lower extremity.  She was unable to perform a straight leg raise  although her quads did fire before discharge.  She was weightbearing as  tolerated.  Her dressing was  changed on a daily basis after  postoperative day number one and had no significant drainage.  The  patient did require one extra day of stay as her ambulation was less  than 50.  On postoperative day number four she was afebrile, she was  stable and ready for discharge.   DISCHARGE DISPOSITION:  Discharge home in stable and improved condition.   DISCHARGE PHYSICAL THERAPY:  Weightbearing as tolerated.  Work on  Theatre manager.  Minimize pain, maximize strength,  increase range of motion.   DISCHARGE DIET:  Regular as tolerated.   DISCHARGE WOUND CARE:  Keep wound dry.   DISCHARGE FOLLOW UP:  Follow-up with Dr. Charlann [name] in two weeks.   DISCHARGE MEDICATIONS INCLUDE:  1. Lovenox 30 mg subcutaneous every 12 hours times 10 days.  2. Biaxin 500 mg p.o. every 6 hours muscle spasm.  3. Vicodin 5/325 mg 1-2 p.o. every 4-6 hours p.r.n. pain.  4. Iron over-the-counter.  5. Aspirin 325 mg p.o. daily times 4 weeks after Lovenox is completed.  6. Colace 100 mg p.o. twice daily.  7. MiraLax 17 grams p.o. daily.  8. Micardis HCT 40 mg one p.o. every morning.  9. Lexapro 10 mg one p.o. every morning.  10.Lorazepam 1 mg p.o. every evening.  11.Prilosec 20 mg one p.o. every morning.  12.Crestor 10 mg one p.o. every morning.  13.Boniva one each month.  14.Calcium + D three times daily.  15.Iron 50 mg new order.  16.Lumigan eye drops one each eye at bedtime.  17.Acetaminophen 500 mg two tablets 3 times daily p.r.n.     ______________________________  [name]. [name], [place]      [name]. [name], M.D.  Electronically Signed    BLM/MEDQ  D:  01/16/2007  T:  01/16/2007  Job:  [id number]

## 2010-09-03 NOTE — Discharge Summary (Signed)
NAMEArlina, [name]                  ACCOUNT NO.:  [id number]   MEDICAL RECORD NO.:  [id number]          PATIENT TYPE:  INP   LOCATION:  3703                         FACILITY:  [facility]   PHYSICIAN:  Melissa S. Peggyann Juba, NPDATE OF BIRTH:  Mar 24, 1931   DATE OF ADMISSION:  10/06/2004  DATE OF DISCHARGE:  10/08/2004                                 DISCHARGE SUMMARY   ADDENDUM:  Please include Protonix 40 mg p.o. b.i.d. for two weeks, then  Protonix 40 mg p.o. daily to discharge medication list for discharge summary  dictated on October 08, 2004.       MSO/MEDQ  D:  10/08/2004  T:  10/08/2004  Job:  [id number]

## 2010-09-03 NOTE — Assessment & Plan Note (Signed)
[facility]                            CARDIOLOGY OFFICE NOTE   NAME:[name], Ashley A                         MRN:          [id number]  DATE:06/06/2006                            DOB:          May 01, 1930    REASON FOR CONSULTATION:  Dyspnea and chest pain.   HISTORY OF PRESENT ILLNESS:  [name] is a very pleasant 77 year old  woman with a history of hypertension, gastroesophageal reflux disease,  and breast cancer who presents today for further evaluation of her  shortness of breath and chest pain.  She denies any history of known  heart disease.  She has never had a cardiac catheterization.  She says  about 6 months ago she started developing shortness of breath.  This has  been relatively stable but severe.  She says she gets markedly short of  breath and fatigued after just a few minutes of doing any cleaning or  her activities of daily living.  In December she also had an episode of  very severe chest pain like somebody was standing on her chest while she  was working on her cabinets.  This resolved spontaneously.  She  underwent a Myoview which showed a normal ejection fraction and no  evidence of ischemia.  She also underwent an echocardiogram which showed  a normal ejection fraction with no significant valvular disease;  however, her pulmonary pressures were mildly elevated and the RV was  mildly dilated.  She denies any lower extremity edema.  She has not had  any orthopnea or PND.  She does not have any significant snoring or  daytime somnolence.  She does have somewhat of a hacking cough.  She has  had a large exposure to second-hand smoke through her husband but she  has not had any of this for 18 years.  She does have a history of right  breast cancer which was treated with surgical resection and tamoxifen.  She has not had radiation therapy.   REVIEW OF SYSTEMS:  Notable for fatigue, arthritis pain, anxiety,  depression, and reflux  disease.  She denies any bright red blood per  rectum or melena.  No fevers, no chills, no melena.  The remainder of  review of systems is negative except for HPI and problem list.   PROBLEM LIST:  1. Dyspnea as described above with abnormal echocardiogram with mild      right-sided dilatation and elevated pulmonary pressures.  2. Hypertension, treated.  3. Hyperlipidemia.  4. History of rheumatic fever.  5. Anxiety/depression.  6. Gastroesophageal reflux disease/hiatal hernia.  7. History of esophageal stricture status post dilation.  8. Osteoarthritis.  9. History of breast cancer status post right mastectomy and tamoxifen      therapy 13 years ago.   CURRENT MEDICATIONS:  1. Micardis 40 mg a day.  2. Lexapro 10 a day.  3. Lorazepam 1 mg a day.  4. Prilosec 20 a day.  5. Calcium.  6. Iron.  7. Boniva.  8. Crestor 10 a day.   ALLERGIES:  MORPHINE and DEMEROL.   SOCIAL HISTORY:  She is married.  She has three kids.  She is retired,  formerly in [employer].  No history of tobacco or alcohol.  She does have  second-hand smoking exposure.   FAMILY HISTORY:  Mother died at 1 due to a stroke.  Father died at 35  due to cancer.  She has two sisters who both died at 73 - one from  emphysema, the other from cancer.  Three brothers.  One died at 63 of  liver disease and two died in their late 70s/early 3s from heart  attacks.   PHYSICAL EXAMINATION:  GENERAL:  She is a very pleasant woman without  any acute distress.  She ambulates around the clinic without any  respiratory difficulty.  VITAL SIGNS:  Blood pressure is 128/80, heart rate 73, weight is 195.  HEENT:  Sclerae anicteric, EOMI.  There is no xanthelasma.  Mucous  membranes are moist, oropharynx is clear.  NECK:  Supple.  There is no JVD.  Carotids are 2+ bilaterally without  bruits.  There is no lymphadenopathy or thyromegaly.  CARDIAC:  Shows a regular rate and rhythm with a soft 2/6 systolic  ejection murmur at the  left sternal border.  S2 is well preserved.  There is no rub or gallop.  LUNGS:  Clear.  ABDOMEN:  Obese, nontender, nondistended, no hepatosplenomegaly, no  bruits, no masses appreciated.  Good bowel sounds.  EXTREMITIES:  Warm with no cyanosis, clubbing or edema.  DP pulses are  1+ bilaterally.  There are no rashes.  NEUROLOGIC:  Alert and oriented x3.  Cranial nerves II-XII are intact.  She moves all four extremities without difficulty.   EKG shows normal sinus rhythm at a rate of 73.  No ST-T wave  abnormalities.   ASSESSMENT AND PLAN:  Dyspnea and previous chest pain.  While her  Myoview is negative I do think she does have some risk for underlying  coronary artery disease, with her risk factors and family history.  I am  also concerned about her RV dilatation and possible pulmonary  hypertension.  Thus I have suggested a right- and left-heart  catheterization to further evaluate.  She is somewhat apprehensive about  this but we went through the risks and benefits and they have agreed to  proceed.  We will also check a chest x-ray and pulmonary function tests.  Should this workup be relatively unrevealing, I think it would be  instructive to pursue chest CT with contrast to make sure she has not  had any pulmonary emboli.  I do think she probably has a component of  diastolic dysfunction but her symptoms seem somewhat out of proportion  to this.   DISPOSITION:  Pending the results of her catheterization.   ADDENDUM FOR CONSULTATION:  We ambulated [name] in the hall with a  pulse oximeter.  After walking approximately 35 yards, her O2  saturations dropped to 78% on room air.  I am thus more concerned about  interstitial lung disease, pulmonary hypertension or pulmonary embolus.   We will proceed with a chest CT to evaluate for interstitial lung  disease and pulmonary embolus.  We will also give her a prescription for home oxygen.  I would proceed with a right and left  heart  catheterization to evaluate more definitively for pulmonary arterial  hypertension.     Ashley Buckles. Bensimhon, MD  Electronically Signed    DRB/MedQ  DD: 06/06/2006  DT: 06/06/2006  Job #: [id number]   cc:  Ashley Perla, DO

## 2010-09-03 NOTE — Assessment & Plan Note (Signed)
[facility]                            CARDIOLOGY OFFICE NOTE   NAME:[name], [name]                         MRN:          [id number]  DATE:06/06/2006                            DOB:          [birth year]    ADDENDUM FOR CONSULTATION:  We ambulated [name] in the hall with [name]  pulse oximeter.  After walking approximately 35 yards, her O2  saturations dropped to 78% on room air.  I am thus more concerned about  interstitial lung disease, pulmonary hypertension or pulmonary embolus.   We will proceed with [name] chest CT to evaluate for interstitial lung  disease and pulmonary embolus.  We will also give her [name] prescription for  home oxygen.  I would proceed with [name] right and left heart  catheterization to evaluate more definitively for pulmonary arterial  hypertension.     [name]. Bensimhon, MD     DRB/MedQ  DD: 06/06/2006  DT: 06/06/2006  Job #: [id number]   cc:   Lelon Perla, DO

## 2010-09-03 NOTE — Cardiovascular Report (Signed)
NAMEPalmer, Shorey Zane                  ACCOUNT NO.:  [id number]   MEDICAL RECORD NO.:  [id number]          PATIENT TYPE:  OIB   LOCATION:  1962                         FACILITY:  [facility]   PHYSICIAN:  [name]. Bensimhon, MDDATE OF BIRTH:  03-22-1931   DATE OF PROCEDURE:  06/15/2006  DATE OF DISCHARGE:                            CARDIAC CATHETERIZATION   REFERRING PHYSICIAN:  Dr. [name].   CARDIOLOGIST:  Dr. Gala Romney.   PATIENT IDENTIFICATION:  Ms. Marxen is a 77 year old woman who was  referred for an abnormal EKG and progressive dyspnea.  Echocardiogram  showed mild RV hypertrophy with some dilatation.  There was concern over  pulmonary hypertension as well as possible coronary artery disease.  She  did have some exertional hypoxemia with saturations in the high 70s on  walking.  She was started on home oxygen.  She is brought in today for  diagnostic right and left heart catheterization in the outpatient  laboratory.   PROCEDURES PERFORMED:  1. Right heart catheterization.  2. Left heart catheterization.  3. Left ventriculogram.  4. Selective coronary angiography.   DESCRIPTION OF THE PROCEDURES:  The risks and benefits of the  catheterization were explained.  Consent was signed and placed on the  chart.  A 4-French arterial sheath was placed in the right femoral  artery under sterile conditions using a modified Seldinger technique.  Standard catheters including a JL-4, 3-D RC and angled pigtail were used  for the procedure.  All catheter exchanges were made over a wire.  A 7-  French venous sheath was placed in the right femoral vein using a  similar technique.  A standard Swan-Ganz catheter was used for right  heart catheterization.   FINDINGS:  HEMODYNAMICS:  Right atrial mean of 13, RV pressure 39/11.  PA pressure 36/99 with a mean of 27.  Pulmonary capillary wedge 21/17  with a mean of 17.  Fick cardiac output was 4.0 liters per minute.  Cardiac index 2.1 liters  per minute per meter square.  Pulmonary  vascular resistance was 2.5 Woods units.  Central aortic pressure 119/61  with a mean of 86.  LV was 133/2 and EDP of 11.  Central aortic  saturations were 93% on room air.  Arterial saturations were 69% on room  air and 71% on room air.  There is no aortic stenosis on pullback.  There was a question of mild to moderate mitral stenosis with a mean  gradient between wedge and LVEDP of about 6 with a mitral valve area  calculated at 1.2 cm2.   CORONARY ANATOMY:  Left main was normal.   LAD was a moderate-sized vessel that bifurcated distally.  There was one  diagonal.  It was free of critical disease with just minor irregularity.   The left circumflex was a large system.  It gave off a small ramus, a  large branching OM-1, large OM-2.  It is angiographically normal.   Right coronary artery was a small technically dominant system.  It gave  off a RV branch.  It appeared that there was an acute marginal  which  gave off a moderate-sized PDA and a small posterolateral.  There are  minor irregularities in the right coronary.   Left ventriculogram done in the RAO position showed hyperdynamic  ventricle, an EF of 70-75% with no wall motion abnormalities.  There was  minimal catheter induced mitral regurgitation.   On panning down over the abdomen, there was a central area of hyper  density which was likely perhaps retained contract, but it was unclear  exactly what it was.   ASSESSMENT:  1. Normal coronary arteries.  2. Normal left ventricular function.  3. Mild increase in pulmonary pressures in the setting of apparent      mild to moderate mitral stenosis.  4. Hyper dense area in the abdomen, question of previous surgical      changes or retained contrast.   PLAN/DISCUSSION:  Given her exertional hypoxemia, I suspect her dyspnea  is mostly related to a pulmonary process.  I am a bit surprised that her  pulmonary pressures are not higher.  At  this point, I will review her  echocardiogram, her PFTs and chest CTs to further evaluate her source of  dyspnea.  If there is still any question, we may consider  cardiopulmonary exercise testing or perhaps VQ scan to rule out chronic  pulmonary emboli.      [name]. Bensimhon, MD  Electronically Signed     DRB/MEDQ  D:  06/15/2006  T:  06/15/2006  Job:  [id number]

## 2010-09-06 ENCOUNTER — Ambulatory Visit: Payer: Medicare Other | Admitting: Emergency Medicine

## 2010-09-09 ENCOUNTER — Encounter: Payer: Self-pay | Admitting: Emergency Medicine

## 2010-09-09 ENCOUNTER — Ambulatory Visit (INDEPENDENT_AMBULATORY_CARE_PROVIDER_SITE_OTHER): Payer: Medicare Other | Admitting: Emergency Medicine

## 2010-09-09 DIAGNOSIS — R0602 Shortness of breath: Secondary | ICD-10-CM

## 2010-09-09 NOTE — Patient Instructions (Signed)
We will not start any breathing medications at this time.  Follow up with Dr Delton Coombes if you develop any changes in your breathing.

## 2010-09-09 NOTE — Assessment & Plan Note (Signed)
Appears to be improved. CXR shows no evidence of recurrent COP. Allergies appear stable off med. Depression playing a role, but she is dealing w death of her husband.  - stable at this time, no meds to add - rov prn

## 2010-09-09 NOTE — Progress Notes (Signed)
  Subjective:    Patient ID: [name], female    DOB: 1930-07-25, 77 y.o.   MRN: [id number]  HPI 77 yo woman, never smoker, hx ulcerative colitis, breast CA. Admitted for UTI, A Fib + RVR. During that hospitalization noted to have B infilltrates, was treated empirically for ? pulm edema or PNA w abx. Appearance on film and clinical hx more consistent with COP. Treated with pred 40mg  once daily starting 12/30 w clinical improvement, now follows up.   In retrospect has has dyspnea with exertion evaluated by [name], Treated beginning in Spring '11 w Advair for ? COPD (never smoked but 2nd hand smoke)  ROV 05/31/10 -- follow up visit for B infiltrates, possible COP. We have been tapering steroids, planning a slightly slower taper for her ulcerative colitis. Breathing is better than last visit, but still some SOB with significant exertion, example shopping all day. Has been taking Advair once daily on most days; has PFT that show mixed disease.   ROV 08/16/10 -- Hx of B infiltrates, probable COP. Has been off pred since March, was doing well off of it. Stopped Advair last visit, did well off of it. She noticed more exertional SOB over the last 2 weeks. Also more allergy sx, with nasal congestion and gtt. Has been treated w abx x 2 since last visit. Some of it may be depression, she is having a hard time living alone with the loss of her husband. Hemoglobin stable on last check by [name]. She injured her R ribs when bending over about 10 days ago. She was having the SOB before the rib injury  ROV 09/09/10 -- follows up for her DOE. She is better than a month ago - rib pain better. Breathing better - still feels a bit run down. She is going to slowly increase her activity. Allergy symptoms are a bit better  Review of Systems As above    Objective:   Physical Exam    Gen: Pleasant, well-nourished, in no distress,  normal affect  ENT: No lesions,  mouth clear,  oropharynx clear, no postnasal  drip  Neck: No JVD, no TMG, no carotid bruits  Lungs: No use of accessory muscles, no dullness to percussion, clear without rales or rhonchi  Cardiovascular: RRR, heart sounds normal, no murmur or gallops, no peripheral edema  Musculoskeletal: No deformities, no cyanosis or clubbing  Neuro: alert, non focal  Skin: Warm, no lesions or rashes     Assessment & Plan:  SHORTNESS OF BREATH Appears to be improved. CXR shows no evidence of recurrent COP. Allergies appear stable off med. Depression playing a role, but she is dealing w death of her husband.  - stable at this time, no meds to add - rov prn

## 2010-09-20 ENCOUNTER — Other Ambulatory Visit: Payer: Self-pay | Admitting: Internal Medicine

## 2010-10-02 ENCOUNTER — Encounter: Payer: Self-pay | Admitting: Family Medicine

## 2010-10-25 ENCOUNTER — Ambulatory Visit (INDEPENDENT_AMBULATORY_CARE_PROVIDER_SITE_OTHER): Payer: Medicare Other | Admitting: Family Medicine

## 2010-10-25 ENCOUNTER — Encounter: Payer: Self-pay | Admitting: Family Medicine

## 2010-10-25 VITALS — BP 130/84 | HR 77 | Temp 97.3°F | Wt 172.2 lb

## 2010-10-25 DIAGNOSIS — I10 Essential (primary) hypertension: Secondary | ICD-10-CM

## 2010-10-25 DIAGNOSIS — E785 Hyperlipidemia, unspecified: Secondary | ICD-10-CM

## 2010-10-25 DIAGNOSIS — M25551 Pain in right hip: Secondary | ICD-10-CM | POA: Insufficient documentation

## 2010-10-25 DIAGNOSIS — L659 Nonscarring hair loss, unspecified: Secondary | ICD-10-CM

## 2010-10-25 DIAGNOSIS — M25559 Pain in unspecified hip: Secondary | ICD-10-CM

## 2010-10-25 LAB — BASIC METABOLIC PANEL
BUN: 22 mg/dL (ref 6–23)
Calcium: 9.6 mg/dL (ref 8.4–10.5)
Creatinine, Ser: 0.8 mg/dL (ref 0.4–1.2)
GFR: 74.85 mL/min (ref >60.00)
Glucose, Bld: 80 mg/dL (ref 70–99)

## 2010-10-25 LAB — HEPATIC FUNCTION PANEL
ALT: 9 U/L (ref 0–35)
AST: 19 U/L (ref 0–37)
Alkaline Phosphatase: 77 U/L (ref 39–117)
Bilirubin, Direct: 0.1 mg/dL (ref 0.0–0.3)
Total Protein: 7.4 g/dL (ref 6.0–8.3)

## 2010-10-25 LAB — CBC WITH DIFFERENTIAL/PLATELET
Basophils Relative: 0.4 % (ref 0.0–3.0)
Eosinophils Absolute: 0 10*3/uL (ref 0.0–0.7)
Eosinophils Relative: 1 % (ref 0.0–5.0)
Hemoglobin: 11.3 g/dL — ABNORMAL LOW (ref 12.0–15.0)
Lymphocytes Relative: 24.9 % (ref 12.0–46.0)
Monocytes Relative: 5.1 % (ref 3.0–12.0)
Neutro Abs: 3.4 10*3/uL (ref 1.4–7.7)
Neutrophils Relative %: 68.6 % (ref 43.0–77.0)
RBC: 4.37 Mil/uL (ref 3.87–5.11)
WBC: 5 10*3/uL (ref 4.5–10.5)

## 2010-10-25 LAB — T4, FREE: Free T4: 0.59 ng/dL — ABNORMAL LOW (ref 0.60–1.60)

## 2010-10-25 NOTE — Assessment & Plan Note (Signed)
Cont meds Check labs in OCT

## 2010-10-25 NOTE — Assessment & Plan Note (Signed)
con't meds stable 

## 2010-10-25 NOTE — Assessment & Plan Note (Signed)
Improving Call or rto prn

## 2010-10-25 NOTE — Assessment & Plan Note (Signed)
Check labs Take biotin rto or call prn

## 2010-10-25 NOTE — Patient Instructions (Signed)
Hip Pain The hips join the upper legs to the lower pelvis. The bones, cartilage, tendons, and muscles of the hip joint perform a lot of work each day holding your body weight and allowing you to move around. Hip pain is a common symptom. It can range from a minor ache to severe pain on 1 or both hips. Pain may be felt on the inside of the hip joint near the groin, or the outside near the buttocks and upper thigh. There may be swelling or stiffness as well. It occurs more often when a person walks or performs activity. There are many reasons hip pain can develop. CAUSES It is important to work with your caregiver to identify the cause since many conditions can impact the bones, cartilage, muscles, and tendons of the hips. Causes for hip pain include:  Broken (fractured) bones.   Separation of the thighbone from the hip socket (dislocation).   Torn cartilage of the hip joint.   Swelling (inflammation) of a tendon (tendonitis), the sac within the hip joint (bursitis), or a joint.   A weakening in the abdominal wall (hernia), affecting the nerves to the hip.   Arthritis in the hip joint or lining of the hip joint.   Pinched nerves in the back, hip, or upper thigh.   A bulging disc in the spine (herniated disc).   Rarely, bone infection or cancer.  DIAGNOSIS The location of your hip pain will help your caregiver understand what may be causing the pain. A diagnosis is based on your medical history, your symptoms, results from your physical exam, and results from diagnostic tests. Diagnostic tests may include X-ray exams, a computerized magnetic scan (magnetic resonance imaging, MRI), or bone scan. TREATMENT Treatment will depend on the cause of your hip pain. Treatment may include:  Limiting activities and resting until symptoms improve.   Crutches or other walking supports (a cane or brace).   Ice, elevation, and compression.   Physical therapy or home exercises.   Shoe inserts or  special shoes.   Losing weight.   Medications to reduce pain.   Undergoing surgery.  HOME CARE INSTRUCTIONS  Only take over-the-counter or prescription medicines for pain, discomfort, or fever as directed by your caregiver.   Put ice on the injured area:   Put ice in a plastic bag.   Place a towel between your skin and the bag.   Leave the ice on for 15 minutes at a time, 15 times a day.   Keep your leg raised (elevated) when possible to lessen swelling.   Avoid activities that cause pain.   Follow specific exercises as directed by your caregiver.   Sleep with a pillow between your legs on your most comfortable side.   Record how often you have hip pain, the location of the pain, and what it feels like. This information may be helpful to you and your caregiver.   Ask your caregiver about returning to work or sports and whether you should drive.   Follow up with your caregiver for further exams, therapy, or testing as directed.  SEEK MEDICAL CARE IF:  Pain or swelling continues or worsens beyond 1 week.   You have an oral temperature above 100.4.   You are feeling unwell or have chills.   You are having an increasingly difficult time with walking.   You have a loss of sensation or other new symptoms.   You have questions or concerns.  SEEK IMMEDIATE MEDICAL CARE IF:  You cannot put weight on the affected hip.   You have fallen.   You have a sudden increase in pain and swelling in your hip.   You have an oral temperature above 100.4, not controlled by medicine.  MAKE SURE YOU:  Understand these instructions.   Will watch your condition.   Will get help right away if you are not doing well or get worse.  Document Released: 09/22/2009  [facility] Patient Information 2011 [place], [place].

## 2010-10-25 NOTE — Progress Notes (Signed)
  Subjective:    Patient ID: [name], female    DOB: August 11, 1930, 77 y.o.   MRN: [id number]  HPI  Pt here c/o R hip pain since recent vacation.  They did a lot of walking and climbing steps.  It is better now with rest.  Pt also c/o losing her hair.  She states she thinks she is eating enough protein.  No other problems.  Review of Systems    as above Objective:   Physical Exam  Constitutional: She is oriented to person, place, and time. She appears well-developed and well-nourished. No distress.  Neck: Normal range of motion. Neck supple.  Cardiovascular: Regular rhythm.   Pulmonary/Chest: Effort normal and breath sounds normal.  Musculoskeletal: She exhibits tenderness. She exhibits no edema.       Mild tenderness with getting up from sitting and steps--- much better than originally  Neurological: She is alert and oriented to person, place, and time.  Skin: She is not diaphoretic.  Psychiatric: She has a normal mood and affect. Her behavior is normal. Judgment and thought content normal.          Assessment & Plan:

## 2010-11-01 ENCOUNTER — Other Ambulatory Visit: Payer: Self-pay | Admitting: Family Medicine

## 2010-11-01 ENCOUNTER — Other Ambulatory Visit: Payer: Self-pay | Admitting: Internal Medicine

## 2010-11-02 NOTE — Telephone Encounter (Signed)
Faxed.   KP 

## 2010-11-02 NOTE — Telephone Encounter (Signed)
Last seen 10/25/10 and filled 07/13/10 please advise--- KP

## 2010-11-10 ENCOUNTER — Other Ambulatory Visit: Payer: Self-pay | Admitting: Ophthalmology

## 2010-11-10 DIAGNOSIS — H469 Unspecified optic neuritis: Secondary | ICD-10-CM

## 2010-11-10 DIAGNOSIS — H543 Unqualified visual loss, both eyes: Secondary | ICD-10-CM

## 2010-11-16 ENCOUNTER — Ambulatory Visit
Admission: RE | Admit: 2010-11-16 | Discharge: 2010-11-16 | Disposition: A | Discharge status: Home / Self Care | Payer: Medicare Other | Source: Ambulatory Visit | Attending: Ophthalmology | Admitting: Ophthalmology

## 2010-11-16 DIAGNOSIS — H543 Unqualified visual loss, both eyes: Secondary | ICD-10-CM

## 2010-11-16 DIAGNOSIS — H469 Unspecified optic neuritis: Secondary | ICD-10-CM

## 2010-12-21 ENCOUNTER — Other Ambulatory Visit: Payer: Self-pay | Admitting: Nurse Practitioner

## 2010-12-24 ENCOUNTER — Telehealth: Payer: Self-pay | Admitting: Internal Medicine

## 2010-12-24 NOTE — Telephone Encounter (Signed)
She may reduce her Asacal to 3/day. If  She is doing OK. She may pick up few samples of AsacalHD 800mg , take 1 po bid if she runs out of her regular Asacal.

## 2010-12-24 NOTE — Telephone Encounter (Signed)
Spoke with patient and she is doing well with her colon. She states the Asacol is getting expensive for her since she is now in the doughnut hole. (she currently takes Asacol 400 mg 3 tabs BID) She also states she needs to schedule a follow up OV. Scheduled patient on 02/09/11 at 2:15 PM. Hx ulcerative colitis last ov 08/12/10. Please, advise on medication.

## 2010-12-27 NOTE — Telephone Encounter (Signed)
Spoke with patient and she will decrease to Asacol 3/day until Dr. Regino Schultze appointment.

## 2010-12-27 NOTE — Telephone Encounter (Signed)
Left a message for patient to call me. 

## 2011-01-03 ENCOUNTER — Ambulatory Visit (INDEPENDENT_AMBULATORY_CARE_PROVIDER_SITE_OTHER): Payer: Medicare Other | Admitting: Family Medicine

## 2011-01-03 ENCOUNTER — Encounter: Payer: Self-pay | Admitting: Family Medicine

## 2011-01-03 VITALS — BP 124/72 | HR 70 | Temp 98.3°F | Wt 176.4 lb

## 2011-01-03 DIAGNOSIS — M791 Myalgia, unspecified site: Secondary | ICD-10-CM

## 2011-01-03 DIAGNOSIS — IMO0001 Reserved for inherently not codable concepts without codable children: Secondary | ICD-10-CM

## 2011-01-03 DIAGNOSIS — E785 Hyperlipidemia, unspecified: Secondary | ICD-10-CM

## 2011-01-03 DIAGNOSIS — R300 Dysuria: Secondary | ICD-10-CM

## 2011-01-03 DIAGNOSIS — D649 Anemia, unspecified: Secondary | ICD-10-CM

## 2011-01-03 LAB — CBC WITH DIFFERENTIAL/PLATELET
Basophils Absolute: 0 10*3/uL (ref 0.0–0.1)
HCT: 33.3 % — ABNORMAL LOW (ref 36.0–46.0)
Hemoglobin: 10.7 g/dL — ABNORMAL LOW (ref 12.0–15.0)
Lymphs Abs: 1.1 10*3/uL (ref 0.7–4.0)
MCV: 80 fl (ref 78.0–100.0)
Monocytes Absolute: 0.2 10*3/uL (ref 0.1–1.0)
Neutro Abs: 3.1 10*3/uL (ref 1.4–7.7)
Platelets: 264 10*3/uL (ref 150.0–400.0)
RDW: 19 % — ABNORMAL HIGH (ref 11.5–14.6)

## 2011-01-03 LAB — BASIC METABOLIC PANEL
BUN: 20 mg/dL (ref 6–23)
CO2: 27 mEq/L (ref 19–32)
Chloride: 107 mEq/L (ref 96–112)
Glucose, Bld: 91 mg/dL (ref 70–99)
Potassium: 3.6 mEq/L (ref 3.5–5.1)
Sodium: 139 mEq/L (ref 135–145)

## 2011-01-03 LAB — POCT URINALYSIS DIPSTICK
Blood, UA: NEGATIVE
Spec Grav, UA: 1.015
Urobilinogen, UA: 0.2
pH, UA: 5

## 2011-01-03 LAB — LIPID PANEL
Cholesterol: 146 mg/dL (ref 0–200)
LDL Cholesterol: 71 mg/dL (ref 0–99)
Triglycerides: 80 mg/dL (ref 0.0–149.0)
VLDL: 16 mg/dL (ref 0.0–40.0)

## 2011-01-03 LAB — HEPATIC FUNCTION PANEL
ALT: 7 U/L (ref 0–35)
AST: 21 U/L (ref 0–37)
Albumin: 3.8 g/dL (ref 3.5–5.2)
Total Bilirubin: 0.6 mg/dL (ref 0.3–1.2)

## 2011-01-03 LAB — IBC PANEL
Saturation Ratios: 6.8 % — ABNORMAL LOW (ref 20.0–50.0)
Transferrin: 368.6 mg/dL — ABNORMAL HIGH (ref 212.0–360.0)

## 2011-01-03 MED ORDER — ESCITALOPRAM OXALATE 20 MG PO TABS: 20.0000 mg | ORAL_TABLET | Freq: Every day | ORAL | Status: DC

## 2011-01-03 MED ORDER — TRAMADOL HCL 50 MG PO TABS: 50.0000 mg | ORAL_TABLET | Freq: Four times a day (QID) | ORAL | Status: AC | PRN

## 2011-01-03 MED ORDER — ROSUVASTATIN CALCIUM 20 MG PO TABS: 20.0000 mg | ORAL_TABLET | Freq: Every day | ORAL | Status: DC

## 2011-01-03 NOTE — Progress Notes (Signed)
  Subjective:    EDESSA Gray is a 77 y.o. female who complains of dysuria. She has had symptoms for 4 weeks. Patient also complains of back pain. Patient denies congestion, cough, fever, headache, rhinitis, sorethroat, stomach ache and vaginal discharge. Patient does not have a history of recurrent UTI. Patient does not have a history of pyelonephritis.   Pt daughter is present and states she was doing too much in yard and has been complaining of muscle aches since about 2 days after working in yard over 1 week ago.  Tylenol is not helping.    She also fell about a week ago--from a stool she was sitting on < 1 foot high and slipped off and landed on carpet area.  Pt c/o pain when getting up from chair but otherwise it doesn't hurt.    The following portions of the patient's history were reviewed and updated as appropriate: allergies, current medications, past family history, past medical history, past social history, past surgical history and problem list.  Review of Systems Pertinent items are noted in HPI.    Objective:    BP 124/72  Pulse 70  Temp(Src) 98.3 F (36.8 C) (Oral)  Wt 176 lb 6.4 oz (80.015 kg)  SpO2 97% General appearance: alert, cooperative, appears stated age and no distress Abdomen: soft, non-tender; bowel sounds normal; no masses,  no organomegaly Extremities: extremities normal, atraumatic, no cyanosis or edema Neurologic: Alert and oriented X 3, normal strength and tone. Normal symmetric reflexes. Normal coordination and gait.  Pt is forgetting some things ---ex   Laboratory:  Urine dipstick: large for leukocyte esterase, trace for protein and small for urobilinogen.   Micro exam: not done.    Assessment:    dysuria Myalgia- improving Low back pain--- improving     Plan:  Check culture before treating unless pt calls with worsening symptoms Ultram for muscle aches  Maintain adequate hydration. Follow up if symptoms not improving, and as needed.

## 2011-01-03 NOTE — Patient Instructions (Addendum)
Dysuria (Pain with Urination) Dysuria is the medical term for pain with urination. There are many causes for dysuria, but urinary tract infection is the most common. If a urinalysis was performed it can show that there is a urinary tract infection. A urine culture confirms that you or your child are sick. You will need to follow-up with a healthcare provider because:  If a urine culture was done you will need to know the culture results and treatment recommendations.   If the urine culture was positive, you or your child will need to be put on antibiotics or know if the antibiotics prescribed are the right antibiotics for your urinary tract infection.   If the urine culture is negative (no urinary tract infection), then other causes may need to be explored or antibiotics need to be stopped.   Today laboratory work may have been done and there does not seem to be a an infection. If cultures were done they will take at least 24 to 48 hours to be completed.   Today x-rays may have been taken and they read as normal. No cause can be found for the problems. The x-rays may be re-read by a radiologist and you will be contacted if additional findings are made.   You or your child may have been put on medications to help with this problem until you can see your primary caregiver. If the problems get better, see your primary caregiver if the problems return. If you were given antibiotics (medications which kill germs), take all of the mediations as directed for the full course of treatment.  If laboratory work was done, you need to find the results. Leave a telephone number where you can be reached. If this is not possible, make sure you find out how you are to get test results. HOME CARE INSTRUCTIONS  Drink lots of fluids. For adults, drink eight, 8 ounce glasses of clear juice or water a day. For children, replace fluids as suggested by your caregiver.   Empty the bladder often. Avoid holding urine for  long periods of time.   After a bowel movement, women should cleanse front to back, using each tissue only once.   Empty your bladder before and after sexual intercourse.   Take all the medicine given to you until it is gone. You may feel better in a few days, but TAKE ALL MEDICINE.   Avoid caffeine, tea, alcohol and carbonated beverages, because they tend to irritate the bladder.   In men, alcohol may irritate the prostate.   Only take over-the-counter or prescription medicines for pain, discomfort, or fever as directed by your caregiver.   If your caregiver has given you a follow-up appointment, it is very important to keep that appointment. Not keeping the appointment could result in a chronic or permanent injury, pain, and disability. If there is any problem keeping the appointment, you must call back to this facility for assistance.  SEEK IMMEDIATE MEDICAL CARE IF:  Back pain develops.   A fever develops.   There is nausea (feeling sick to your stomach) or vomiting (throwing up).   Problems are no better with medications or are getting worse.  MAKE SURE YOU:   Understand these instructions.   Will watch your condition.   Will get help right away if you are not doing well or get worse.  Document Released: 01/01/2004 Document Re-Released: 09/22/2009 [facility] Patient Information 2011 [place], [place]. Myalgia, Muscle Pain Myalgia is the medical term for muscle pain.  It is a symptom of many things. Nearly everyone at some time in their life has this. The most common cause for muscle pain is overuse or straining and more so when you are not in shape. Injuries and muscle bruises cause myalgias. Muscle pain without a history of injury can also be caused by a virus. It frequently comes along with the flu. Myalgia not caused by muscle strain can be present in a large number of infectious diseases. Some autoimmune diseases like lupus and fibromyalgia can cause muscle pain. Myalgia may be  mild, or severe. SYMPTOMS The symptoms of myalgia are simply muscle pain. Most of the time this is short lived and the pain goes away without treatment. DIAGNOSIS Myalgia is diagnosed by your caregiver by taking your history. This means you tell him when the problems began, what they are, and what has been happening. If this has not been a long term problem, your caregiver may want to watch for a while to see what will happen. If it has been long term, they may want to do additional testing. TREATMENT The treatment depends on what the underlying cause of the muscle pain is. Often anti-inflammatory medications will help. HOME CARE INSTRUCTIONS  If the pain in your muscles came from overuse, slow down your activities until the problems go away.   Myalgia from overuse of a muscle can be treated with alternating hot and cold packs on the muscle affected or with cold for the first couple days. If either heat or cold seems to make things worse, stop their use.   Apply ice to the sore area for 15-20 minutes 3-4 times per day while awake for the first 2 days of muscle soreness, or as directed. Put the ice in a plastic bag and place a towel between the bag of ice and your skin.   Only take over-the-counter or prescription medicines for pain, discomfort, or fever as directed by your caregiver.   Regular gentle exercise may help if you are not active.   Stretching before strenuous exercise can help lower the risk of myalgia. It is normal when beginning an exercise regimen to feel some muscle pain after exercising. Muscles that have not been used frequently will be sore at first. If the pain is extreme, this may mean injury to a muscle.  seek medical care if:  You have an increase in muscle pain that is not relieved with medication.   You begin to run a temperature.   You develop nausea and vomiting.   You develop a stiff and painful neck.   You develop a rash.   You develop muscle pain after a  tick bite.   You have continued muscle pain while working out even after you are in good condition.  seek immediate medical care if:  Any of your problems are getting worse and medications are not helping. MAKE SURE YOU:   Understand these instructions.   Will watch your condition.   Will get help right away if you are not doing well or get worse.  Document Released: 02/24/2006 Document Re-Released: 07/01/2008 [facility] Patient Information 2011 [place], [place].

## 2011-01-11 ENCOUNTER — Telehealth: Payer: Self-pay

## 2011-01-11 NOTE — Telephone Encounter (Signed)
Discussed results with patient and she stated that she just started her iron tablets and is taking it once a day, she will call back to schedule the appt. Not having any UA symptoms but still having the itching and burning in the vaginal area and wanted to know if she can get something call in for that. Denied Vaginal Discharge. Please advise    KP

## 2011-01-11 NOTE — Telephone Encounter (Signed)
See culture results--- if symptomatic--call in macrobid and recheck urine and culture in 2 weeks

## 2011-01-11 NOTE — Telephone Encounter (Signed)
Message copied by Arnette Norris on Tue Jan 11, 2011  6:13 PM ------      Message from: Lelon Perla      Created: Thu Jan 06, 2011  8:56 PM       Pt anemia is worse--- is she taking iron?  If no -- se needs to start---- if yes---we need stool cards done and to increase iron to bid      Recheck 1 month  285.9  Cbcd, ferritin, ibc

## 2011-01-12 MED ORDER — NYSTATIN 100000 UNIT/GM EX CREA: TOPICAL_CREAM | Freq: Two times a day (BID) | CUTANEOUS | Status: AC

## 2011-01-12 NOTE — Telephone Encounter (Signed)
Patient aware     KP 

## 2011-01-12 NOTE — Telephone Encounter (Signed)
The patient stated she was not having symptoms of a UTI, she stated the outside of her Vagina is itching and when she uses the rest room it burns where she has scratched. She wanted to know if there was anything we could do about that. Please advise    KP

## 2011-01-12 NOTE — Telephone Encounter (Signed)
Nystatin cream bid 

## 2011-01-13 LAB — DIFFERENTIAL
Basophils Absolute: 0
Basophils Relative: 0
Eosinophils Absolute: 0.1
Eosinophils Relative: 2
Lymphocytes Relative: 10 — ABNORMAL LOW
Lymphocytes Relative: 26
Lymphocytes Relative: 9 — ABNORMAL LOW
Lymphs Abs: 0.7
Lymphs Abs: 1.3
Monocytes Absolute: 0.3
Monocytes Absolute: 0.3
Monocytes Relative: 6
Monocytes Relative: 7
Neutro Abs: 3.3
Neutro Abs: 6
Neutrophils Relative %: 66
Neutrophils Relative %: 85 — ABNORMAL HIGH

## 2011-01-13 LAB — BASIC METABOLIC PANEL
BUN: 14
BUN: 7
BUN: 9
CO2: 25
CO2: 25
CO2: 30
Calcium: 8.4
Chloride: 104
Chloride: 105
Chloride: 107
Chloride: 109
Creatinine, Ser: 0.57
GFR calc Af Amer: >60
GFR calc non Af Amer: >60
GFR calc non Af Amer: >60
GFR calc non Af Amer: >60
Glucose, Bld: 127 — ABNORMAL HIGH
Glucose, Bld: 99
Potassium: 3.4 — ABNORMAL LOW
Potassium: 3.7
Potassium: 3.9
Sodium: 139
Sodium: 141

## 2011-01-13 LAB — URINALYSIS, ROUTINE W REFLEX MICROSCOPIC
Glucose, UA: NEGATIVE
Nitrite: NEGATIVE
Specific Gravity, Urine: 1.029
pH: 5.5

## 2011-01-13 LAB — CBC
HCT: 27.3 — ABNORMAL LOW
Hemoglobin: 10 — ABNORMAL LOW
Hemoglobin: 13.9
Hemoglobin: 9.4 — ABNORMAL LOW
MCHC: 34.1
MCV: 94.4
RBC: 2.89 — ABNORMAL LOW
RBC: 4.23
RDW: 13.6
WBC: 5.1
WBC: 6.7

## 2011-01-13 LAB — APTT: aPTT: 33

## 2011-01-13 LAB — URINE MICROSCOPIC-ADD ON

## 2011-01-13 LAB — TYPE AND SCREEN

## 2011-01-27 LAB — CBC
Hemoglobin: 9.4 — ABNORMAL LOW
MCHC: 35
MCHC: 35
MCV: 97.1
Platelets: 196
RBC: 2.76 — ABNORMAL LOW
RBC: 3.08 — ABNORMAL LOW
WBC: 6.8
WBC: 7

## 2011-01-27 LAB — BASIC METABOLIC PANEL
BUN: 10
CO2: 27
CO2: 28
Calcium: 8.6
Calcium: 8.7
Chloride: 104
Creatinine, Ser: 0.58
GFR calc Af Amer: >60
GFR calc non Af Amer: >60
Glucose, Bld: 127 — ABNORMAL HIGH
Potassium: 3.5
Sodium: 138
Sodium: 139

## 2011-01-28 LAB — CBC
MCHC: 34.8
MCV: 97.3
Platelets: 242
RBC: 3.9
RDW: 12.7

## 2011-01-28 LAB — APTT: aPTT: 27

## 2011-01-28 LAB — URINALYSIS, ROUTINE W REFLEX MICROSCOPIC
Nitrite: NEGATIVE
Protein, ur: NEGATIVE
Specific Gravity, Urine: 1.025
Urobilinogen, UA: 1

## 2011-01-28 LAB — TYPE AND SCREEN
ABO/RH(D): O NEG
Antibody Screen: NEGATIVE

## 2011-01-28 LAB — COMPREHENSIVE METABOLIC PANEL
AST: 14
CO2: 27
Calcium: 9.9
Creatinine, Ser: 0.81
GFR calc Af Amer: >60
GFR calc non Af Amer: >60
Sodium: 142
Total Protein: 6.2

## 2011-01-28 LAB — URINE MICROSCOPIC-ADD ON

## 2011-01-28 LAB — ABO/RH: ABO/RH(D): O NEG

## 2011-01-31 LAB — PROTIME-INR: Prothrombin Time: 13.4

## 2011-01-31 LAB — CBC
HCT: 37.6
Platelets: 270
RBC: 3.87
WBC: 5.8

## 2011-01-31 LAB — BASIC METABOLIC PANEL
BUN: 15
Creatinine, Ser: 0.85
GFR calc Af Amer: >60
GFR calc non Af Amer: >60
Potassium: 3.6

## 2011-01-31 LAB — APTT: aPTT: 26

## 2011-02-03 ENCOUNTER — Other Ambulatory Visit: Payer: Self-pay | Admitting: Family Medicine

## 2011-02-03 DIAGNOSIS — D649 Anemia, unspecified: Secondary | ICD-10-CM

## 2011-02-04 ENCOUNTER — Other Ambulatory Visit (INDEPENDENT_AMBULATORY_CARE_PROVIDER_SITE_OTHER): Payer: Medicare Other

## 2011-02-04 DIAGNOSIS — E785 Hyperlipidemia, unspecified: Secondary | ICD-10-CM

## 2011-02-04 DIAGNOSIS — D649 Anemia, unspecified: Secondary | ICD-10-CM

## 2011-02-04 DIAGNOSIS — E119 Type 2 diabetes mellitus without complications: Secondary | ICD-10-CM

## 2011-02-04 LAB — CBC WITH DIFFERENTIAL/PLATELET
Basophils Absolute: 0 10*3/uL (ref 0.0–0.1)
Lymphocytes Relative: 31.4 % (ref 12.0–46.0)
Lymphs Abs: 1.3 10*3/uL (ref 0.7–4.0)
Monocytes Relative: 5.1 % (ref 3.0–12.0)
Platelets: 229 10*3/uL (ref 150.0–400.0)
RDW: 22.8 % — ABNORMAL HIGH (ref 11.5–14.6)

## 2011-02-04 LAB — LIPID PANEL
HDL: 58.4 mg/dL (ref >39.00)
LDL Cholesterol: 68 mg/dL (ref 0–99)
Total CHOL/HDL Ratio: 2
VLDL: 14.2 mg/dL (ref 0.0–40.0)

## 2011-02-04 LAB — BASIC METABOLIC PANEL
Calcium: 8.9 mg/dL (ref 8.4–10.5)
GFR: 104.76 mL/min (ref >60.00)
Sodium: 143 mEq/L (ref 135–145)

## 2011-02-04 LAB — HEPATIC FUNCTION PANEL
Alkaline Phosphatase: 79 U/L (ref 39–117)
Bilirubin, Direct: 0 mg/dL (ref 0.0–0.3)

## 2011-02-04 LAB — HEMOGLOBIN A1C: Hgb A1c MFr Bld: 6.1 % (ref 4.6–6.5)

## 2011-02-04 LAB — IBC PANEL: Saturation Ratios: 10.7 % — ABNORMAL LOW (ref 20.0–50.0)

## 2011-02-04 LAB — FERRITIN: Ferritin: 12.4 ng/mL (ref 10.0–291.0)

## 2011-02-04 NOTE — Progress Notes (Signed)
Labs only

## 2011-02-09 ENCOUNTER — Ambulatory Visit (INDEPENDENT_AMBULATORY_CARE_PROVIDER_SITE_OTHER): Payer: Medicare Other | Admitting: Internal Medicine

## 2011-02-09 ENCOUNTER — Encounter: Payer: Self-pay | Admitting: Internal Medicine

## 2011-02-09 VITALS — BP 108/68 | HR 74 | Ht 65.0 in | Wt 173.8 lb

## 2011-02-09 DIAGNOSIS — D509 Iron deficiency anemia, unspecified: Secondary | ICD-10-CM

## 2011-02-09 DIAGNOSIS — K5100 Ulcerative (chronic) pancolitis without complications: Secondary | ICD-10-CM

## 2011-02-09 DIAGNOSIS — R195 Other fecal abnormalities: Secondary | ICD-10-CM

## 2011-02-09 NOTE — Patient Instructions (Addendum)
Please follow up with Dr [name] in 6 months. CC: Dr Laury Axon

## 2011-02-09 NOTE — Progress Notes (Signed)
[name] January 29, 1931 MRN [id number]    History of Present Illness:  This is a 77 year old white female with the recent diagnosis of ulcerative colitis diagnosed in 2011. She is status post hospitalization in December 2011. She responded to a prednisone taper. She had a brief episode of colitis in the 1990s. She currently has no diarrhea or abdominal pain. She is still depressed and upset about the death of her husband 3 years ago. She takes Asacol 1.2 g daily and iron supplements. Her Hgb was 11.3 last week. She has a low Iron at 6% saturation. There is a history of peptic ulcer disease with partial gastric outlet obstruction. Her last endoscopy in January 2012 showed no active disease. She has been on Nexium 40 mg daily and denies early satiety or epigastric pain. Her last colonoscopy in December 2011 because of her family history of colon cancer in her father showed universal ulcerative colitis   Past Medical History  Diagnosis Date  . Hypertension   . Osteoporosis   . COPD (chronic obstructive pulmonary disease)   . Hyperlipidemia   . Anxiety   . Depression   . Breast cancer 13 years ago    s/p tamoxifen   . Peptic ulcer disease   . Hiatal hernia   . Ulcerative colitis   . GERD (gastroesophageal reflux disease)   . Hiatal hernia   . Osteoarthritis   . Diverticulosis   . Esophageal stricture   . Internal hemorrhoids   . Gastric outlet obstruction   . Rheumatic heart disease   . Glaucoma    Past Surgical History  Procedure Date  . Breast surgery     right  . Cataract extraction 08/2006 and 10/2006  . Total knee arthroplasty     left  . Hemorrhoid surgery   . Tonsillectomy   . Abdominal hysterectomy     reports that she has never smoked. She has never used smokeless tobacco. She reports that she does not drink alcohol or use illicit drugs. family history includes Breast cancer in an unspecified family member; Cirrhosis in her brother; Colon cancer (age of onset:67) in her  father; Coronary artery disease in her brother and sister; Crohn's disease in her brother; Diabetes in an unspecified family member; Hypertension in an unspecified family member; and Stroke (age of onset:54) in her mother. Allergies  Allergen Reactions  . Meperidine Hcl     REACTION: THROAT SWELLING  . Morphine     REACTION: SWELLING  . Nitroglycerin         Review of Systems: Denies dysphagia, odynophagia chest pain shortness of breath  The remainder of the 10 point ROS is negative except as outlined in H&P   Physical Exam: General appearance  Well developed, in no distress. Eyes- non icteric. HEENT nontraumatic, normocephalic. Mouth no lesions, tongue papillated, no cheilosis. Neck supple without adenopathy, thyroid not enlarged, no carotid bruits, no JVD. Lungs Clear to auscultation bilaterally. Cor normal S1, normal S2, regular rhythm, no murmur,  quiet precordium. Abdomen: Soft nontender abdomen with normal active bowel sounds liver edge at costal margin. Rectal: Soft Hemoccult negative stool with small focus of Hemoccult-positive stool. Extremities no pedal edema. Skin no lesions. Neurological alert and oriented x 3. Psychological normal mood and affect.  Assessment and Plan:  Problem #1 Ulcerative colitis in remission. She will continue on mesalamine 1.2 g daily.  Problem #2 Iron deficiency anemia. Patient is on iron supplements for this. We will consider a repeat upper endoscopy to look  for an active ulcer.  Problem #3 peptic ulcer disease and duodenal outlet obstruction as per the last endoscopy in January 2012. Patient has no symptoms currently.   02/09/2011 [name]

## 2011-02-22 ENCOUNTER — Telehealth: Payer: Self-pay | Admitting: Internal Medicine

## 2011-02-22 ENCOUNTER — Other Ambulatory Visit (INDEPENDENT_AMBULATORY_CARE_PROVIDER_SITE_OTHER): Payer: Medicare Other

## 2011-02-22 ENCOUNTER — Ambulatory Visit (INDEPENDENT_AMBULATORY_CARE_PROVIDER_SITE_OTHER): Payer: Medicare Other | Admitting: Internal Medicine

## 2011-02-22 ENCOUNTER — Encounter: Payer: Self-pay | Admitting: Internal Medicine

## 2011-02-22 DIAGNOSIS — K5900 Constipation, unspecified: Secondary | ICD-10-CM

## 2011-02-22 DIAGNOSIS — K921 Melena: Secondary | ICD-10-CM

## 2011-02-22 DIAGNOSIS — R159 Full incontinence of feces: Secondary | ICD-10-CM

## 2011-02-22 DIAGNOSIS — R195 Other fecal abnormalities: Secondary | ICD-10-CM

## 2011-02-22 LAB — CBC WITH DIFFERENTIAL/PLATELET
Basophils Absolute: 0 10*3/uL (ref 0.0–0.1)
Eosinophils Absolute: 0 10*3/uL (ref 0.0–0.7)
HCT: 41.1 % (ref 36.0–46.0)
Lymphs Abs: 1.5 10*3/uL (ref 0.7–4.0)
Monocytes Absolute: 0.4 10*3/uL (ref 0.1–1.0)
Monocytes Relative: 2.8 % — ABNORMAL LOW (ref 3.0–12.0)
Platelets: 272 10*3/uL (ref 150.0–400.0)
RDW: 22.4 % — ABNORMAL HIGH (ref 11.5–14.6)

## 2011-02-22 MED ORDER — HYDROCORTISONE ACE-PRAMOXINE 2.5-1 % RE CREA: TOPICAL_CREAM | Freq: Three times a day (TID) | RECTAL | Status: AC | PRN

## 2011-02-22 MED ORDER — CEPHALEXIN 250 MG PO CAPS: 250.0000 mg | ORAL_CAPSULE | Freq: Four times a day (QID) | ORAL | Status: AC

## 2011-02-22 NOTE — Progress Notes (Signed)
[name] 06/26/1930 MRN [id number]    History of Present Illness:  This is a 77 year old, white female acute add on with inflammatory bowel disease in remission. She developed diarrhea and rectal irritation and was unable to evacuate stool in the rectum. She denies any rectal bleeding. Her last appointment was 2 weeks ago at which time she was doing well. Her hemoglobin today was 11.3. She also has a history of peptic ulcer disease with partial gastric outlet obstruction. She has been asymptomatic from it. There is a family history of colon cancer in her father. She has been upset about the death of her husband 2 years ago. She has been on Asacol 1.2 g daily and iron supplements.   Past Medical History  Diagnosis Date  . Hypertension   . Osteoporosis   . COPD (chronic obstructive pulmonary disease)   . Hyperlipidemia   . Anxiety   . Depression   . Breast cancer 13 years ago    s/p tamoxifen   . Peptic ulcer disease   . Hiatal hernia   . Ulcerative colitis   . GERD (gastroesophageal reflux disease)   . Hiatal hernia   . Osteoarthritis   . Diverticulosis   . Esophageal stricture   . Internal hemorrhoids   . Gastric outlet obstruction   . Rheumatic heart disease   . Glaucoma    Past Surgical History  Procedure Date  . Breast surgery     right  . Cataract extraction 08/2006 and 10/2006  . Total knee arthroplasty     left  . Hemorrhoid surgery   . Tonsillectomy   . Abdominal hysterectomy     reports that she has never smoked. She has never used smokeless tobacco. She reports that she does not drink alcohol or use illicit drugs. family history includes Breast cancer in an unspecified family member; Cirrhosis in her brother; Colon cancer (age of onset:67) in her father; Coronary artery disease in her brother and sister; Crohn's disease in her brother; Diabetes in an unspecified family member; Hypertension in an unspecified family member; and Stroke (age of onset:54) in her  mother. Allergies  Allergen Reactions  . Meperidine Hcl     REACTION: THROAT SWELLING  . Morphine     REACTION: SWELLING  . Nitroglycerin         Review of Systems: Negative for heartburn nausea or vomiting or fever  The remainder of the 10 point ROS is negative except as outlined in H&P   Physical Exam: General appearance  Well developed, in no distress. Eyes- non icteric. HEENT nontraumatic, normocephalic. Mouth no lesions, tongue papillated, no cheilosis. Neck supple without adenopathy, thyroid not enlarged, no carotid bruits, no JVD. Lungs Clear to auscultation bilaterally. Cor normal S1, normal S2, regular rhythm, no murmur,  quiet precordium. Abdomen: Soft nontender with normoactive bowel sounds. No distention. Rectal: Macerated perianal area and rectum, edema of the anal canal. No fistula or drainage. Very tender anal canal. Large fecal impaction in the rectum. Stool is Hemoccult negative. There is a seepage of liquid stool around the impaction but decreased rectal sphincter tone. Extremities no pedal edema. Skin no lesions. Neurological alert and oriented x 3. Psychological normal mood and affect.  Assessment and Plan:  Problem #1 Fecal impaction with overflow diarrhea. This could be precipitated by iron supplements or inactivity and poor dietary habits. She will take a colonoscopy prep to try to evacuate the stool. If not successful in next 24 hours, we will obtain a KUB  and possibly repeat the prep. In the meantime, she will also use topical Analpram cream 2.5%  applied to the rectum. Her white blood cell count is 13,000, so we will start her on Keflex 250 mg by mouth 4 times a day for 5 days to prevent development of a perirectal abscess.   02/22/2011 [name]

## 2011-02-22 NOTE — Patient Instructions (Signed)
We have given you a suprep bowel purge. Please follow the written instructions given to you regarding this. We have sent the following medications to your pharmacy for you to pick up at your convenience: Analpram Cream Keflex Please call our office in the next 24-48 hours with an update on your condition. CC: Dr [name]

## 2011-02-22 NOTE — Telephone Encounter (Signed)
Spoke with patient's daughter and she states the patient starting straining to have a bowel movement yesterday and last night. She has been up and down all night trying to have a bowel movement. She is oozing black stool that is tar like and has bad odor. She is having rectal pain and her stomach hurts below the belly button. No visible maroon or red blood. She states she has a knot or hemorrhoid at rectal area. Patient is on iron tablets daily. Patient states she had her last good bowel movement on Saturday. Hx. Ulcerative colitis, peptic ulcer, iron def. Anemia. Spoke with Dr. [name], patient to come for CBC, diff stat. Spoke with patient's daughter and she will bring her now for lab.

## 2011-02-24 ENCOUNTER — Telehealth: Payer: Self-pay | Admitting: Internal Medicine

## 2011-02-24 NOTE — Telephone Encounter (Signed)
Patient states she has been "cleaned out good now." She completed the Suprep purge with excellent results. Her bottom is better. She is feeling weak from the liquid diet and states she has eaten an egg and bland foods today. She is going to try to eat a more balanced diet. She wants to know exactly what Dr. [name] wants her to take now that she is cleaned out.

## 2011-02-24 NOTE — Telephone Encounter (Signed)
She can go back on regular diet, soft foods first. Use Miralax 17 gm qd or qod.

## 2011-02-25 NOTE — Telephone Encounter (Signed)
Spoke with patient and gave her Dr. Brodie's recommendations. 

## 2011-03-16 ENCOUNTER — Other Ambulatory Visit: Payer: Self-pay | Admitting: Family Medicine

## 2011-03-16 NOTE — Telephone Encounter (Signed)
Dr.Lowne Patient     KP

## 2011-03-16 NOTE — Telephone Encounter (Signed)
Last seen 01/03/11 and filled 11/02/10 please advise    KP

## 2011-03-21 NOTE — Telephone Encounter (Signed)
Please advise      KP 

## 2011-03-21 NOTE — Telephone Encounter (Signed)
To DR Laury Axon

## 2011-03-22 NOTE — Telephone Encounter (Signed)
Faxed.   KP 

## 2011-04-13 ENCOUNTER — Telehealth: Payer: Self-pay | Admitting: *Deleted

## 2011-04-13 ENCOUNTER — Telehealth: Payer: Self-pay | Admitting: Emergency Medicine

## 2011-04-13 NOTE — Telephone Encounter (Signed)
Per pt's chart, pt was scheduled to see her PCP 12.27.12 @ 1130.  Nothing further needed.  Will sign off.

## 2011-04-13 NOTE — Telephone Encounter (Signed)
Pt has bad cough that is not going away with muccinex/cough syrup and advised that she had the flu last week per had body aches and fever with cough, pt states that she has not had a fever in three days and no longer has body aches, placed on schedule for 11:30am on 04-14-11 per could not come in any other time

## 2011-04-13 NOTE — Telephone Encounter (Signed)
Noted.  Agree w/ OV

## 2011-04-13 NOTE — Telephone Encounter (Signed)
Called spoke with patient who c/o laryngitis onset this AM, cough occasionally producing browish-clear mucus x2weeks.  Denies increased SOB, wheezing, f/c/s.  Reports flu-like symptoms last week that have resolved.  Has been taking mucinex dm that she reports has helped.  Reports she has called her PCP and has not heard back from them yet.  [facility] [address] [place]  RB not in office, will forward to doc of the day.  Dr Vassie Loll please advise, thanks.  Allergies  Allergen Reactions  . Meperidine Hcl     REACTION: THROAT SWELLING  . Morphine     REACTION: SWELLING  . Nitroglycerin

## 2011-04-14 ENCOUNTER — Encounter: Payer: Self-pay | Admitting: Family Medicine

## 2011-04-14 ENCOUNTER — Ambulatory Visit (INDEPENDENT_AMBULATORY_CARE_PROVIDER_SITE_OTHER): Payer: Medicare Other | Admitting: Family Medicine

## 2011-04-14 VITALS — BP 115/75 | HR 88 | Temp 97.9°F | Ht 65.0 in | Wt 171.4 lb

## 2011-04-14 DIAGNOSIS — J40 Bronchitis, not specified as acute or chronic: Secondary | ICD-10-CM | POA: Insufficient documentation

## 2011-04-14 MED ORDER — AZITHROMYCIN 250 MG PO TABS: 250.0000 mg | ORAL_TABLET | Freq: Every day | ORAL | Status: AC

## 2011-04-14 MED ORDER — BENZONATATE 200 MG PO CAPS: 200.0000 mg | ORAL_CAPSULE | Freq: Three times a day (TID) | ORAL | Status: AC | PRN

## 2011-04-14 NOTE — Assessment & Plan Note (Signed)
Pt in no distress today but given duration of the illness, severe cough, and pt's age- will start abx.  Cough meds prn.  Reviewed supportive care and red flags that should prompt return.  Pt expressed understanding and is in agreement w/ plan.

## 2011-04-14 NOTE — Progress Notes (Signed)
  Subjective:    Patient ID: [name], female    DOB: Sep 15, 1930, 77 y.o.   MRN: [id number]  HPI Cough- sxs started 10 days ago.  'i had the old-timey flu last week'.  Had fever x2-3 days.  Cough persists, mostly dry.  + nasal congestion, hoarseness.  Denies facial pain, ear pain. Tooth pain.  + sick contacts.   Review of Systems For ROS see HPI     Objective:   Physical Exam  Vitals reviewed. Constitutional: She appears well-developed and well-nourished. No distress.  HENT:  Head: Normocephalic and atraumatic.       TMs normal bilaterally Mild nasal congestion Throat w/out erythema, edema, or exudate  Eyes: Conjunctivae and EOM are normal. Pupils are equal, round, and reactive to light.  Neck: Normal range of motion. Neck supple.  Cardiovascular: Normal rate, regular rhythm, normal heart sounds and intact distal pulses.   No murmur heard. Pulmonary/Chest: Effort normal and breath sounds normal. No respiratory distress. She has no wheezes.       + hacking cough  Lymphadenopathy:    She has no cervical adenopathy.          Assessment & Plan:

## 2011-04-14 NOTE — Patient Instructions (Signed)
This appears to be a bronchitis Start the Azithromycin as directed Use the cough pills as needed Continue the Mucinex Drink plenty of fluids REST! Hang in there! Happy New Year!

## 2011-05-10 ENCOUNTER — Other Ambulatory Visit: Payer: Self-pay | Admitting: Internal Medicine

## 2011-05-10 NOTE — Telephone Encounter (Signed)
Dr [name]- Patient wants refills on Lialda. However, I cannot tell where we have ever given her Lialda (unless she got samples at some point). Do you want her to be on Lialda or Asacol and what would you like directions to be???

## 2011-05-10 NOTE — Telephone Encounter (Signed)
She  Ought to take Lialda 1.2 gm, 1 po qd, #30, 3 refills.

## 2011-05-11 MED ORDER — MESALAMINE 1.2 G PO TBEC: 1200.0000 mg | DELAYED_RELEASE_TABLET | Freq: Every day | ORAL | Status: DC

## 2011-05-11 NOTE — Telephone Encounter (Signed)
Advised patient that Dr [name] prefers she take Lialda. I have sent an rx to her pharmacy.

## 2011-05-18 ENCOUNTER — Telehealth: Payer: Self-pay | Admitting: *Deleted

## 2011-05-18 MED ORDER — MESALAMINE 400 MG PO TBEC: DELAYED_RELEASE_TABLET | ORAL | Status: DC

## 2011-05-18 NOTE — Telephone Encounter (Signed)
Asacal 400mg , 3 po qd

## 2011-05-18 NOTE — Telephone Encounter (Signed)
Advised patient that her insurance does not want to cover Lialda since she did not have any adverse reaction to Asacol (their preference). I have spoken to Dr [name] and she is okay with patient switching back to Asacol. She just gave her samples of Lialda since we had that in the office. I have also spoken to the patient. She was able to get a 30 day supply of Lialda and will finish taking that before starting back on Asacol. Dr [name], patient has several rx's for Asacol, several with different directions for usage. How would you like patient to take the Asacol 400?

## 2011-06-28 ENCOUNTER — Encounter: Payer: Self-pay | Admitting: Family Medicine

## 2011-06-28 ENCOUNTER — Ambulatory Visit (INDEPENDENT_AMBULATORY_CARE_PROVIDER_SITE_OTHER): Payer: Medicare Other | Admitting: Family Medicine

## 2011-06-28 DIAGNOSIS — M810 Age-related osteoporosis without current pathological fracture: Secondary | ICD-10-CM

## 2011-06-28 DIAGNOSIS — M25519 Pain in unspecified shoulder: Secondary | ICD-10-CM

## 2011-06-28 DIAGNOSIS — M25511 Pain in right shoulder: Secondary | ICD-10-CM

## 2011-06-28 MED ORDER — LORAZEPAM 2 MG PO TABS: ORAL_TABLET | ORAL | Status: DC

## 2011-06-28 NOTE — Patient Instructions (Signed)
Shoulder Pain The shoulder is a ball and socket joint. The muscles and tendons (rotator cuff) are what keep the shoulder in its joint and stable. This collection of muscles and tendons holds in the head (ball) of the humerus (upper arm bone) in the fossa (cup) of the scapula (shoulder blade). Today no reason was found for your shoulder pain. Often pain in the shoulder may be treated conservatively with temporary immobilization. For example, holding the shoulder in one place using a sling for rest. Physical therapy may be needed if problems continue. HOME CARE INSTRUCTIONS   Apply ice to the sore area for 15 to 20 minutes, 3 to 4 times per day for the first 2 days. Put the ice in a plastic bag. Place a towel between the bag of ice and your skin.   If you have or were given a shoulder sling and straps, do not remove for as long as directed by your caregiver or until you see a caregiver for a follow-up examination. If you need to remove it to shower or bathe, move your arm as little as possible.   Sleep on several pillows at night to lessen swelling and pain.   Only take over-the-counter or prescription medicines for pain, discomfort, or fever as directed by your caregiver.   Keep any follow-up appointments in order to avoid any type of permanent shoulder disability or chronic pain problems.  SEEK MEDICAL CARE IF:   Pain in your shoulder increases or new pain develops in your arm, hand, or fingers.   Your hand or fingers are colder than your other hand.   You do not obtain pain relief with the medications or your pain becomes worse.  SEEK IMMEDIATE MEDICAL CARE IF:   Your arm, hand, or fingers are numb or tingling.   Your arm, hand, or fingers are swollen, painful, or turn white or blue.   You develop chest pain or shortness of breath.  MAKE SURE YOU:   Understand these instructions.   Will watch your condition.   Will get help right away if you are not doing well or get worse.    Document Released: 01/12/2005 Document Revised: 03/24/2011 Document Reviewed: 03/19/2011 [employer] Patient Information 2012 [employer]. 

## 2011-06-28 NOTE — Progress Notes (Signed)
  Subjective:    [name] is a 78 y.o. female who presents with right shoulder pain. The symptoms began several weeks ago. Aggravating factors: no known event. Pain is located diffusely throughout the shoulder. Discomfort is described as aching. Symptoms are exacerbated by overhead movements. Evaluation to date: none. Therapy to date includes: nothing specific.  The following portions of the patient's history were reviewed and updated as appropriate: allergies, current medications, past family history, past medical history, past social history, past surgical history and problem list.  Review of Systems Pertinent items are noted in HPI.   Objective:    BP 132/70  Pulse 75  Temp(Src) 98.4 F (36.9 C) (Oral)  Wt 178 lb 3.2 oz (80.831 kg)  SpO2 97% Right shoulder: non-specific diffuse tenderness about the shoulder-- only with movement.  Pain with overhead movement.  Left shoulder: normal active ROM, no tenderness, no impingement sign     Assessment:    Right shoulder pain    Plan:    Educational material distributed. Reduction in offending activity. Rest, ice, compression, and elevation (RICE) therapy. Plain film x-rays.  Use sling--pt has at home Pt offered pain meds and she refused.

## 2011-06-30 ENCOUNTER — Ambulatory Visit (INDEPENDENT_AMBULATORY_CARE_PROVIDER_SITE_OTHER)
Admission: RE | Admit: 2011-06-30 | Discharge: 2011-06-30 | Disposition: A | Discharge status: Home / Self Care | Payer: Medicare Other | Source: Ambulatory Visit | Attending: Family Medicine | Admitting: Family Medicine

## 2011-06-30 DIAGNOSIS — M25511 Pain in right shoulder: Secondary | ICD-10-CM

## 2011-06-30 DIAGNOSIS — M25519 Pain in unspecified shoulder: Secondary | ICD-10-CM

## 2011-09-30 ENCOUNTER — Encounter: Payer: Self-pay | Admitting: Family Medicine

## 2011-09-30 ENCOUNTER — Ambulatory Visit (INDEPENDENT_AMBULATORY_CARE_PROVIDER_SITE_OTHER): Payer: Medicare Other | Admitting: Family Medicine

## 2011-09-30 ENCOUNTER — Telehealth: Payer: Self-pay | Admitting: Family Medicine

## 2011-09-30 VITALS — BP 136/94 | HR 78 | Temp 98.3°F | Wt 180.0 lb

## 2011-09-30 DIAGNOSIS — Z1239 Encounter for other screening for malignant neoplasm of breast: Secondary | ICD-10-CM

## 2011-09-30 DIAGNOSIS — F418 Other specified anxiety disorders: Secondary | ICD-10-CM

## 2011-09-30 DIAGNOSIS — E785 Hyperlipidemia, unspecified: Secondary | ICD-10-CM

## 2011-09-30 DIAGNOSIS — Z780 Asymptomatic menopausal state: Secondary | ICD-10-CM

## 2011-09-30 DIAGNOSIS — F341 Dysthymic disorder: Secondary | ICD-10-CM

## 2011-09-30 LAB — CBC WITH DIFFERENTIAL/PLATELET
Basophils Absolute: 0 10*3/uL (ref 0.0–0.1)
Basophils Relative: 1 % (ref 0–1)
HCT: 43.7 % (ref 36.0–46.0)
Hemoglobin: 14.7 g/dL (ref 12.0–15.0)
Lymphocytes Relative: 26 % (ref 12–46)
Monocytes Absolute: 0.3 10*3/uL (ref 0.1–1.0)
Monocytes Relative: 5 % (ref 3–12)
Neutro Abs: 3.8 10*3/uL (ref 1.7–7.7)
Neutrophils Relative %: 64 % (ref 43–77)
RDW: 13.2 % (ref 11.5–15.5)
WBC: 5.9 10*3/uL (ref 4.0–10.5)

## 2011-09-30 LAB — BASIC METABOLIC PANEL
CO2: 24 mEq/L (ref 19–32)
Chloride: 105 mEq/L (ref 96–112)
Potassium: 4.1 mEq/L (ref 3.5–5.3)
Sodium: 142 mEq/L (ref 135–145)

## 2011-09-30 LAB — HEPATIC FUNCTION PANEL
ALT: 11 U/L (ref 0–35)
AST: 23 U/L (ref 0–37)
Alkaline Phosphatase: 85 U/L (ref 39–117)
Bilirubin, Direct: 0.1 mg/dL (ref 0.0–0.3)
Indirect Bilirubin: 0.3 mg/dL (ref 0.0–0.9)
Total Protein: 7.5 g/dL (ref 6.0–8.3)

## 2011-09-30 LAB — LIPID PANEL
LDL Cholesterol: 126 mg/dL — ABNORMAL HIGH (ref 0–99)
VLDL: 22 mg/dL (ref 0–40)

## 2011-09-30 LAB — TSH: TSH: 1.084 u[IU]/mL (ref 0.350–4.500)

## 2011-09-30 MED ORDER — ESCITALOPRAM OXALATE 20 MG PO TABS: 20.0000 mg | ORAL_TABLET | Freq: Every day | ORAL | Status: DC

## 2011-09-30 MED ORDER — ROSUVASTATIN CALCIUM 20 MG PO TABS: 20.0000 mg | ORAL_TABLET | Freq: Every day | ORAL | Status: DC

## 2011-09-30 MED ORDER — LORAZEPAM 2 MG PO TABS: ORAL_TABLET | ORAL | Status: DC

## 2011-09-30 NOTE — Patient Instructions (Addendum)

## 2011-09-30 NOTE — Telephone Encounter (Signed)
Done came today

## 2011-09-30 NOTE — Telephone Encounter (Signed)
Message copied by Verner Chol on Fri Sep 30, 2011  2:56 PM ------      Message from: Almeta Monas P      Created: Thu Sep 29, 2011  2:05 PM      Regarding: RE: ov appt at 1:15pm tomorrow       Make sure she has 30 mins             ----- Message -----         From: Theodis Sato McDaniels         Sent: 09/29/2011   1:26 PM           To: Arnette Norris, CMA      Subject: ov appt at 1:15pm tomorrow                               This patient called in today and is having what I think is severe anxiety. I really tried to get her to come in today to see Dr.Lowne but she refused. I did put her in at 1:15pm tomorrow and if that is not ok please let me know so I can call her back. She broke down crying on me and is having some emotional problems because her sister was put in a nursing home.      thanks

## 2011-09-30 NOTE — Progress Notes (Signed)
  Subjective:   [name] is an 78 y.o. female who presents for evaluation and treatment of depressive symptoms.  Onset approximately husbands death 2   years ago, gradually worsening since that time--- she stopped all her meds just before symptoms worsened again.  Current symptoms include depressed mood, insomnia, fatigue, feelings of worthlessness/guilt, difficulty concentrating, hopelessness, recurrent thoughts of death, anxiety, insomnia, loss of energy/fatigue, disturbed sleep,.  Current treatment for depression:None Sleep problems: Marked   Early awakening:Absent   Energy: Poor Motivation: Fair Concentration: Fair Rumination/worrying: Severe Memory: Fair Tearfulness: Severe  Anxiety: Marked  Panic: Absent  Overall Mood: Severely worse  Hopelessness: Marked Suicidal ideation: Absent  Other/Psychosocial Stressors: anniversary husbands death and sister is in nursing home Family history positive for depression in the patient's unknown.  Previous treatment modalities employed include Individual therapy and Medication.  Past episodes of depression:yes Organic causes of depression present: None.  Review of Systems Pertinent items are noted in HPI.   Objective:   Mental Status Examination: Posture and motor behavior: Appropriate Dress, grooming, personal hygiene: Appropriate Facial expression: Appropriate Speech: Appropriate Mood: Negative Coherency and relevance of thought: Appropriate Thought content: Appropriate Perceptions: Appropriate Orientation:Appropriate Attention and concentration: Appropriate Memory: : Appropriate Information: Not examined Vocabulary: Appropriate Abstract reasoning: Appropriate Judgment: Appropriate    Assessment:   Experiencing the following symptoms of depression most of the day nearly every day for more than two consecutive weeks: depressed mood, loss of energy, trouble concentrating, thoughts of worthlessness or  guilt  Depressive Disorder:with anxiety  Suicide Risk Assessment:  Suicidal intent: no Suicidal plan: no Access to means for suicide: no Lethality of means for suicide: no Prior suicide attempts: no Recent exposure to suicide:no   Plan:    1. Depression with anxiety  escitalopram (LEXAPRO) 20 MG tablet, LORazepam (ATIVAN) 2 MG tablet  2. Hyperlipidemia LDL goal < 100  rosuvastatin (CRESTOR) 20 MG tablet  3. Postmenopausal    4. Breast cancer screening  MM Digital Screening    Reviewed concept of depression as biochemical imbalance of neurotransmitters and rationale for treatment. Instructed patient to contact office or on-call physician promptly should condition worsen or any new symptoms appear and provided on-call telephone numbers.    F/u hospice counseling rto 1 month Face to face > 50 %

## 2011-10-25 ENCOUNTER — Ambulatory Visit
Admission: RE | Admit: 2011-10-25 | Discharge: 2011-10-25 | Disposition: A | Discharge status: Home / Self Care | Payer: Medicare Other | Source: Ambulatory Visit | Attending: Family Medicine | Admitting: Family Medicine

## 2011-10-25 DIAGNOSIS — Z1239 Encounter for other screening for malignant neoplasm of breast: Secondary | ICD-10-CM

## 2011-10-31 ENCOUNTER — Encounter: Payer: Self-pay | Admitting: Family Medicine

## 2011-10-31 ENCOUNTER — Ambulatory Visit (INDEPENDENT_AMBULATORY_CARE_PROVIDER_SITE_OTHER): Payer: Medicare Other | Admitting: Family Medicine

## 2011-10-31 VITALS — BP 132/84 | HR 80 | Temp 98.0°F | Wt 182.8 lb

## 2011-10-31 DIAGNOSIS — Z780 Asymptomatic menopausal state: Secondary | ICD-10-CM

## 2011-10-31 DIAGNOSIS — F418 Other specified anxiety disorders: Secondary | ICD-10-CM

## 2011-10-31 DIAGNOSIS — M25569 Pain in unspecified knee: Secondary | ICD-10-CM

## 2011-10-31 DIAGNOSIS — F341 Dysthymic disorder: Secondary | ICD-10-CM

## 2011-10-31 DIAGNOSIS — I779 Disorder of arteries and arterioles, unspecified: Secondary | ICD-10-CM

## 2011-10-31 DIAGNOSIS — E785 Hyperlipidemia, unspecified: Secondary | ICD-10-CM

## 2011-10-31 DIAGNOSIS — F419 Anxiety disorder, unspecified: Secondary | ICD-10-CM

## 2011-10-31 DIAGNOSIS — M25561 Pain in right knee: Secondary | ICD-10-CM

## 2011-10-31 MED ORDER — ESCITALOPRAM OXALATE 20 MG PO TABS: 20.0000 mg | ORAL_TABLET | Freq: Every day | ORAL | Status: DC

## 2011-10-31 MED ORDER — ROSUVASTATIN CALCIUM 20 MG PO TABS: 20.0000 mg | ORAL_TABLET | Freq: Every day | ORAL | Status: DC

## 2011-10-31 NOTE — Assessment & Plan Note (Signed)
Check US carotid arteries

## 2011-10-31 NOTE — Assessment & Plan Note (Signed)
con't lexapro and ativan prn She is only taking ativan 1 x a night---conisider klonopin if she needs to con't

## 2011-10-31 NOTE — Progress Notes (Signed)
  Subjective:    Patient ID: [name], female    DOB: 21-Jan-1931, 78 y.o.   MRN: [id number]  HPI Pt here f/u depression / anxiety -- she would also like me to review her lifescan screening.  Pt is doing much better now that she is back on lexapro.    Review of Systems As above    Objective:   Physical Exam  Constitutional: She is oriented to person, place, and time. She appears well-developed and well-nourished.  Cardiovascular: Normal rate and regular rhythm.   Pulmonary/Chest: Effort normal and breath sounds normal.  Musculoskeletal: She exhibits no edema.  Neurological: She is alert and oriented to person, place, and time.  Psychiatric: She has a normal mood and affect. Her behavior is normal. Judgment and thought content normal.          Assessment & Plan:

## 2011-10-31 NOTE — Assessment & Plan Note (Signed)
Check bmd 

## 2011-10-31 NOTE — Patient Instructions (Addendum)

## 2011-11-03 ENCOUNTER — Other Ambulatory Visit: Payer: Self-pay | Admitting: *Deleted

## 2011-11-03 DIAGNOSIS — R0989 Other specified symptoms and signs involving the circulatory and respiratory systems: Secondary | ICD-10-CM

## 2011-11-08 ENCOUNTER — Ambulatory Visit (INDEPENDENT_AMBULATORY_CARE_PROVIDER_SITE_OTHER)
Admission: RE | Admit: 2011-11-08 | Discharge: 2011-11-08 | Disposition: A | Discharge status: Home / Self Care | Payer: Medicare Other | Source: Ambulatory Visit | Attending: Family Medicine | Admitting: Family Medicine

## 2011-11-08 ENCOUNTER — Other Ambulatory Visit: Payer: Self-pay

## 2011-11-08 ENCOUNTER — Ambulatory Visit (INDEPENDENT_AMBULATORY_CARE_PROVIDER_SITE_OTHER)
Admission: RE | Admit: 2011-11-08 | Discharge: 2011-11-08 | Disposition: A | Discharge status: Home / Self Care | Payer: Medicare Other | Source: Ambulatory Visit

## 2011-11-08 ENCOUNTER — Encounter (INDEPENDENT_AMBULATORY_CARE_PROVIDER_SITE_OTHER): Payer: Medicare Other

## 2011-11-08 DIAGNOSIS — R0989 Other specified symptoms and signs involving the circulatory and respiratory systems: Secondary | ICD-10-CM

## 2011-11-08 DIAGNOSIS — R05 Cough: Secondary | ICD-10-CM

## 2011-11-08 DIAGNOSIS — I6529 Occlusion and stenosis of unspecified carotid artery: Secondary | ICD-10-CM

## 2011-11-08 DIAGNOSIS — M810 Age-related osteoporosis without current pathological fracture: Secondary | ICD-10-CM

## 2011-11-08 DIAGNOSIS — R42 Dizziness and giddiness: Secondary | ICD-10-CM

## 2011-11-09 ENCOUNTER — Encounter: Payer: Self-pay | Admitting: Family Medicine

## 2011-11-09 ENCOUNTER — Ambulatory Visit (INDEPENDENT_AMBULATORY_CARE_PROVIDER_SITE_OTHER): Payer: Medicare Other | Admitting: Family Medicine

## 2011-11-09 VITALS — BP 136/90 | HR 90 | Temp 98.3°F | Wt 183.6 lb

## 2011-11-09 DIAGNOSIS — R05 Cough: Secondary | ICD-10-CM

## 2011-11-09 MED ORDER — ESOMEPRAZOLE MAGNESIUM 40 MG PO CPDR: 40.0000 mg | DELAYED_RELEASE_CAPSULE | Freq: Every day | ORAL | Status: DC

## 2011-11-09 NOTE — Patient Instructions (Signed)

## 2011-11-09 NOTE — Progress Notes (Signed)
  Subjective:     [name] is a 78 y.o. female here for evaluation of a cough. Onset of symptoms was several days ago. Symptoms have been unchanged since that time. The cough is dry and is aggravated by exercise and pollens. Associated symptoms include: chest pain and heartburn. Patient does not have a history of asthma. Patient does not have a history of environmental allergens. Patient has not traveled recently. Patient does not have a history of smoking. Patient has had a previous chest x-ray. Patient has not had a PPD done.  The following portions of the patient's history were reviewed and updated as appropriate: allergies, current medications, past family history, past medical history, past social history, past surgical history and problem list.  Review of Systems Pertinent items are noted in HPI.    Objective:    Oxygen saturation 95% on room air BP 136/90  Pulse 90  Temp 98.3 F (36.8 C) (Oral)  Wt 183 lb 9.6 oz (83.28 kg)  SpO2 95% General appearance: alert, cooperative, appears stated age and no distress Ears: normal TM's and external ear canals both ears Nose: Nares normal. Septum midline. Mucosa normal. No drainage or sinus tenderness. Throat: lips, mucosa, and tongue normal; teeth and gums normal Lungs: diminished breath sounds bibasilar and crackles b/l Heart: S1, S2 normal    Assessment:    Cough, Gastroesophageal Reflux and hiatal hernia    Plan:    Avoid exposure to tobacco smoke and fumes. Call if shortness of breath worsens, blood in sputum, change in character of cough, development of fever or chills, inability to maintain nutrition and hydration. Avoid exposure to tobacco smoke and fumes. Pulmonary consult. refill nexium--pt has been off of it--start with samples

## 2011-12-08 ENCOUNTER — Telehealth: Payer: Self-pay | Admitting: Family Medicine

## 2011-12-08 NOTE — Telephone Encounter (Signed)
Caller: Rochell/Patient; Patient Name: [name]; PCP: Lelon Perla.; Best Callback Phone Number: [phone]; Reason for call: Urinary Pain onset 12/04/11. Having sx of urgency and lower abdominal /pelvic pain. She is voiding frequent small amounts and is wearing pad for urgency. Afebrile. Taking AZO tabs. Triage per Urinary Symptoms Protocol and appnt advised within 24 hours for "Has one or more urinary tract Symptoms AND has not been previously evaluated.". Appnt scheduled for 0945 -12/09/11 with Dr. Beverely Low.

## 2011-12-09 ENCOUNTER — Telehealth (INDEPENDENT_AMBULATORY_CARE_PROVIDER_SITE_OTHER): Payer: Medicare Other | Admitting: *Deleted

## 2011-12-09 ENCOUNTER — Ambulatory Visit (INDEPENDENT_AMBULATORY_CARE_PROVIDER_SITE_OTHER): Payer: Medicare Other | Admitting: Family Medicine

## 2011-12-09 ENCOUNTER — Encounter: Payer: Self-pay | Admitting: Family Medicine

## 2011-12-09 VITALS — BP 129/82 | HR 80 | Temp 97.6°F | Ht 65.0 in | Wt 184.8 lb

## 2011-12-09 DIAGNOSIS — R350 Frequency of micturition: Secondary | ICD-10-CM | POA: Insufficient documentation

## 2011-12-09 DIAGNOSIS — R3915 Urgency of urination: Secondary | ICD-10-CM

## 2011-12-09 LAB — POCT URINALYSIS DIPSTICK
Bilirubin, UA: NEGATIVE
Glucose, UA: NEGATIVE
Nitrite, UA: NEGATIVE
Spec Grav, UA: 1.01
Urobilinogen, UA: 0.2

## 2011-12-09 MED ORDER — CEPHALEXIN 500 MG PO CAPS: 500.0000 mg | ORAL_CAPSULE | Freq: Two times a day (BID) | ORAL | Status: DC

## 2011-12-09 NOTE — Progress Notes (Signed)
  Subjective:    Patient ID: [name], female    DOB: 1931-03-25, 78 y.o.   MRN: [id number]  HPI ? UTI- sxs started on Sunday morning w/ burning in vagina, worse w/ urination.  Had increased frequency.  + suprapubic pressure.  Pt used 3 day vaginal cream twice daily and sxs improved.  No longer having burning.  No fevers.  No back pain.   Review of Systems For ROS see HPI     Objective:   Physical Exam  Vitals reviewed. Constitutional: She appears well-developed and well-nourished. No distress.  Abdominal: Soft. She exhibits no distension. There is no tenderness (no suprapubic or CVA tenderness).  Genitourinary:       Pt declined vaginal exam          Assessment & Plan:

## 2011-12-09 NOTE — Patient Instructions (Addendum)
Provide a urine sample when you are able Drink plenty of fluids I'm glad you're feeling better! Call with any questions or concerns Hang in there!!!

## 2011-12-09 NOTE — Telephone Encounter (Signed)
Was given verbal orders from MD Tabori to send pt Keflex 500mg  BID times 5 days, per noted pt brought in a urine sample after OV today and trace amount of Leukocytes were noted, pending urine culture, sent ABT escribe, pt is aware and will pick up RX tomorrow

## 2011-12-12 ENCOUNTER — Ambulatory Visit (INDEPENDENT_AMBULATORY_CARE_PROVIDER_SITE_OTHER): Payer: Medicare Other | Admitting: Emergency Medicine

## 2011-12-12 ENCOUNTER — Encounter: Payer: Self-pay | Admitting: Emergency Medicine

## 2011-12-12 ENCOUNTER — Other Ambulatory Visit: Payer: Self-pay | Admitting: Family Medicine

## 2011-12-12 ENCOUNTER — Institutional Professional Consult (permissible substitution): Payer: Medicare Other | Admitting: Pulmonary Disease

## 2011-12-12 VITALS — BP 150/88 | HR 74 | Temp 98.3°F | Ht 65.0 in | Wt 185.2 lb

## 2011-12-12 DIAGNOSIS — J8409 Other alveolar and parieto-alveolar conditions: Secondary | ICD-10-CM

## 2011-12-12 DIAGNOSIS — R05 Cough: Secondary | ICD-10-CM

## 2011-12-12 DIAGNOSIS — R059 Cough, unspecified: Secondary | ICD-10-CM

## 2011-12-12 LAB — URINE CULTURE: Colony Count: >=100000

## 2011-12-12 MED ORDER — LORATADINE 10 MG PO TABS: 10.0000 mg | ORAL_TABLET | Freq: Every day | ORAL | Status: DC

## 2011-12-12 MED ORDER — AZELASTINE HCL 0.1 % NA SOLN: 2.0000 | Freq: Two times a day (BID) | NASAL | Status: DC

## 2011-12-12 NOTE — Telephone Encounter (Signed)
Last seen 11/09/11 and filled 09/30/11 # 60. Please advise     KP

## 2011-12-12 NOTE — Patient Instructions (Addendum)
We will start loratadine 10mg  daily Start astelin 2 sprays each nostril twice a day.  Follow with Dr Delton Coombes in 1 month

## 2011-12-12 NOTE — Progress Notes (Signed)
  Subjective:    Patient ID: [name], female    DOB: 13-Feb-1931, 78 y.o.   MRN: [id number] HPI 78 yo woman, never smoker, hx ulcerative colitis, breast CA. Admitted for UTI, A Fib + RVR. During that hospitalization noted to have B infilltrates, was treated empirically for ? pulm edema or PNA w abx. Appearance on film and clinical hx more consistent with COP. Treated with pred 40mg  once daily starting 12/30 w clinical improvement, now follows up.   In retrospect has has dyspnea with exertion evaluated by Dr Laury Axon, Treated beginning in Spring '11 w Advair for ? COPD (never smoked but 2nd hand smoke)  ROV 05/31/10 -- follow up visit for B infiltrates, possible COP. We have been tapering steroids, planning a slightly slower taper for her ulcerative colitis. Breathing is better than last visit, but still some SOB with significant exertion, example shopping all day. Has been taking Advair once daily on most days; has PFT that show mixed disease.   ROV 08/16/10 -- Hx of B infiltrates, probable COP. Has been off pred since March, was doing well off of it. Stopped Advair last visit, did well off of it. She noticed more exertional SOB over the last 2 weeks. Also more allergy sx, with nasal congestion and gtt. Has been treated w abx x 2 since last visit. Some of it may be depression, she is having a hard time living alone with the loss of her husband. Hemoglobin stable on last check by Dr [name]. She injured her R ribs when bending over about 10 days ago. She was having the SOB before the rib injury  ROV 09/09/10 -- follows up for her DOE. She is better than a month ago - rib pain better. Breathing better - still feels a bit run down. She is going to slowly increase her activity. Allergy symptoms are a bit better  ROV 12/12/11 -- hx of dyspnea and infiltrates, felt to be most consistent with COP. CXR in May without infiltrates. She reports today that her cough is bothering her again. She is not on an allergy  regimen anymore. Was tried on Nexium x 2 weeks by Dr Laury Axon without change in her cough. She is having daily clear watery PND.   Review of Systems As above    Objective:   Physical Exam Filed Vitals:   12/12/11 1440  BP: 150/88  Pulse: 74  Temp: 98.3 F (36.8 C)   Gen: Pleasant, well-nourished, in no distress,  normal affect  ENT: No lesions,  mouth clear,  oropharynx clear, no postnasal drip  Neck: No JVD, no TMG, no carotid bruits  Lungs: No use of accessory muscles, subtle insp squeek at LUL > R apex  Cardiovascular: RRR, heart sounds normal, soft systolic M, no peripheral edema  Musculoskeletal: No deformities, no cyanosis or clubbing  Neuro: alert, non focal  Skin: Warm, no lesions or rashes   Assessment & Plan:  OTH SPEC ALVEOL&PARIETOALVEOL PNEUMONOPATHIES No clinical evidence recurrence.  - cxr next visit - rov 1 month to assess cough   Cough Suspect due to PND +/- GERD - start loratadine + astelin - consider adding PPI, although she did not benefit on recent trial  - 1 month

## 2011-12-12 NOTE — Assessment & Plan Note (Signed)
No clinical evidence recurrence.  - cxr next visit - rov 1 month to assess cough

## 2011-12-12 NOTE — Assessment & Plan Note (Signed)
Suspect due to PND +/- GERD - start loratadine + astelin - consider adding PPI, although she did not benefit on recent trial  - 1 month

## 2011-12-13 NOTE — Assessment & Plan Note (Signed)
New to provider.  Pt unable to void in office but given cup to return w/ urine sample.  Offered to due vaginal exam to assess for external yeast but pt declined.  UA after pt returned sample showed likely UTI.  abx were called in.  Reviewed supportive care and red flags that should prompt return.  Pt expressed understanding and is in agreement w/ plan.

## 2011-12-21 ENCOUNTER — Telehealth: Payer: Self-pay | Admitting: Family Medicine

## 2011-12-21 NOTE — Telephone Encounter (Signed)
Caller: Zala/Patient; Patient Name: [name]; PCP: Lelon Perla.; Best Callback Phone Number: [phone].  UTI estimated  2 weeks ago, took 5 days of antibiotics, symptoms returned on 12/19/11. Afebrile/subjective.   Emergent symptoms ruled out.  Home care for the interim and see provider within 24 hours per Urinary Symptoms - Female protocol.    Appointment with Dr. Beverely Low at 09:45 on 12/22/11.

## 2011-12-22 ENCOUNTER — Ambulatory Visit: Payer: Medicare Other | Admitting: Family Medicine

## 2011-12-23 ENCOUNTER — Ambulatory Visit (INDEPENDENT_AMBULATORY_CARE_PROVIDER_SITE_OTHER): Payer: Medicare Other | Admitting: Family Medicine

## 2011-12-23 ENCOUNTER — Encounter: Payer: Self-pay | Admitting: Family Medicine

## 2011-12-23 VITALS — BP 124/78 | HR 80 | Temp 97.8°F | Wt 185.6 lb

## 2011-12-23 DIAGNOSIS — M810 Age-related osteoporosis without current pathological fracture: Secondary | ICD-10-CM

## 2011-12-23 DIAGNOSIS — R3915 Urgency of urination: Secondary | ICD-10-CM

## 2011-12-23 LAB — POCT URINALYSIS DIPSTICK
Glucose, UA: NEGATIVE
Ketones, UA: NEGATIVE
Protein, UA: NEGATIVE
Urobilinogen, UA: 0.2

## 2011-12-23 MED ORDER — CIPROFLOXACIN HCL 500 MG PO TABS: 500.0000 mg | ORAL_TABLET | Freq: Two times a day (BID) | ORAL | Status: AC

## 2011-12-23 NOTE — Progress Notes (Signed)
  Subjective:    [name] is a 78 y.o. female who complains of burning with urination and suprapubic pressure. She has had symptoms for several days. Patient also complains of none. Patient denies back pain, congestion, cough, fever, headache, rhinitis, sorethroat and stomach ache. Patient does have a history of recurrent UTI. Patient does not have a history of pyelonephritis.   The following portions of the patient's history were reviewed and updated as appropriate: allergies, current medications, past family history, past medical history, past social history, past surgical history and problem list.  Review of Systems Pertinent items are noted in HPI.    Objective:    BP 124/78  Pulse 80  Temp 97.8 F (36.6 C) (Oral)  Wt 185 lb 9.6 oz (84.188 kg)  SpO2 97% General appearance: alert, cooperative, appears stated age and no distress Abdomen: soft, non-tender; bowel sounds normal; no masses,  no organomegaly  Laboratory:  Urine dipstick: trace for hemoglobin and mod for leukocyte esterase.   Micro exam: not done.    Assessment:    dysuria    osteoporosis--- reviewed bmd with pt-- she was on fosamax in past and states it bothered her stomach-- she would like prolia Plan:    Medications: ciprofloxacin. Maintain adequate hydration. Follow up if symptoms not improving, and as needed.  Culture sent

## 2011-12-25 LAB — URINE CULTURE: Colony Count: 65000

## 2012-01-17 ENCOUNTER — Encounter: Payer: Self-pay | Admitting: Emergency Medicine

## 2012-01-17 ENCOUNTER — Ambulatory Visit (INDEPENDENT_AMBULATORY_CARE_PROVIDER_SITE_OTHER): Payer: Medicare Other | Admitting: Emergency Medicine

## 2012-01-17 VITALS — BP 124/80 | HR 76 | Temp 97.9°F | Ht 65.0 in | Wt 187.8 lb

## 2012-01-17 DIAGNOSIS — R05 Cough: Secondary | ICD-10-CM

## 2012-01-17 DIAGNOSIS — J8409 Other alveolar and parieto-alveolar conditions: Secondary | ICD-10-CM

## 2012-01-17 NOTE — Patient Instructions (Addendum)
Restart the loratadine 10mg  daily Stop astelin spray Start nasonex 2 sprays each nostril daily Follow with Dr Delton Coombes in July 2014. We will repeat your CXR at that time.

## 2012-01-17 NOTE — Assessment & Plan Note (Signed)
Hx of COP. She denies any dyspnea at this time.  - will repeat her CXR in July 2014 (1 year mark) or sooner if she has problems.

## 2012-01-17 NOTE — Assessment & Plan Note (Signed)
-   restart loratadine - change astelin to nasonex

## 2012-01-17 NOTE — Progress Notes (Signed)
Subjective:    Patient ID: [name], female    DOB: [birth year], 78 y.o.   MRN: [id number] HPI 78 yo woman, never smoker, hx ulcerative colitis, breast CA. Admitted for UTI, A Fib + RVR. During that hospitalization noted to have B infilltrates, was treated empirically for ? pulm edema or PNA w abx. Appearance on film and clinical hx more consistent with COP. Treated with pred 40mg  once daily starting 12/30 w clinical improvement, now follows up.   In retrospect has has dyspnea with exertion evaluated by Dr Laury Axon, Treated beginning in Spring '11 w Advair for ? COPD (never smoked but 2nd hand smoke)  ROV 05/31/10 -- follow up visit for B infiltrates, possible COP. We have been tapering steroids, planning a slightly slower taper for her ulcerative colitis. Breathing is better than last visit, but still some SOB with significant exertion, example shopping all day. Has been taking Advair once daily on most days; has PFT that show mixed disease.   ROV 08/16/10 -- Hx of B infiltrates, probable COP. Has been off pred since March, was doing well off of it. Stopped Advair last visit, did well off of it. She noticed more exertional SOB over the last 2 weeks. Also more allergy sx, with nasal congestion and gtt. Has been treated w abx x 2 since last visit. Some of it may be depression, she is having a hard time living alone with the loss of her husband. Hemoglobin stable on last check by Dr [name]. She injured her R ribs when bending over about 10 days ago. She was having the SOB before the rib injury  ROV 09/09/10 -- follows up for her DOE. She is better than a month ago - rib pain better. Breathing better - still feels a bit run down. She is going to slowly increase her activity. Allergy symptoms are a bit better  ROV 12/12/11 -- hx of dyspnea and infiltrates, felt to be most consistent with COP. CXR in May without infiltrates. She reports today that her cough is bothering her again. She is not on an allergy  regimen anymore. Was tried on Nexium x 2 weeks by Dr Laury Axon without change in her cough. She is having daily clear watery PND.   ROV 01/17/12 -- Follow up for Dyspnea, cough. Last time we started loratadine and astelin >> her cough and drainage improved. She finished the loratadine and then the cough has worsened. She stopped the astelin on her own - difficult to take. Due for CXR given hx COP, wants to defer until next time.   Review of Systems As above    Objective:   Physical Exam Filed Vitals:   01/17/12 1415  BP: 124/80  Pulse: 76  Temp: 97.9 F (36.6 C)   Gen: Pleasant, well-nourished, in no distress,  normal affect  ENT: No lesions,  mouth clear,  oropharynx clear, no postnasal drip  Neck: No JVD, no TMG, no carotid bruits  Lungs: No use of accessory muscles, subtle insp squeek at LUL > R apex  Cardiovascular: RRR, heart sounds normal, soft systolic M, no peripheral edema  Musculoskeletal: No deformities, no cyanosis or clubbing  Neuro: alert, non focal  Skin: Warm, no lesions or rashes   Assessment & Plan:  OTH SPEC ALVEOL&PARIETOALVEOL PNEUMONOPATHIES Hx of COP. She denies any dyspnea at this time.  - will repeat her CXR in July 2014 (1 year mark) or sooner if she has problems.   Cough - restart loratadine - change astelin  to nasonex

## 2012-01-31 ENCOUNTER — Encounter: Payer: Self-pay | Admitting: Family Medicine

## 2012-02-09 ENCOUNTER — Telehealth: Payer: Self-pay | Admitting: Family Medicine

## 2012-02-09 NOTE — Telephone Encounter (Signed)
CALLED PT TO SCHEDULE Prolia injection as it was approved by Insurance spoek with pt at 9:15am--she stated she has read up on both the prolia & shingles and is not interrested in getting either at this time. She stated she doesn't want to get them if she does not need them as the side effects cause more problems, per her research

## 2012-02-13 ENCOUNTER — Other Ambulatory Visit: Payer: Self-pay | Admitting: Family Medicine

## 2012-02-13 NOTE — Telephone Encounter (Signed)
Last seen 12/23/11 and filled 12/12/11 # 60. Please advise     KP

## 2012-03-16 ENCOUNTER — Telehealth: Payer: Self-pay | Admitting: Family Medicine

## 2012-03-16 NOTE — Telephone Encounter (Signed)
Discussed with patient and she voiced understanding. She agreed to go to Saturday clinic if she can not wait until Monday but she declined going to UC.      KP

## 2012-03-16 NOTE — Telephone Encounter (Signed)
For MD to review      KP 

## 2012-03-16 NOTE — Telephone Encounter (Signed)
Patient Information:  Caller Name: Timyra  Phone: [phone]  Patient: [name], [name]  Gender: Female  DOB: 1931/04/02  Age: 78 Years  PCP: Lelon Perla.   Symptoms  Reason For Call & Symptoms: Urinary urgency, burning; Cough 6 months, Diarrhea x 2days  Reviewed Health History In EMR: Yes  Reviewed Medications In EMR: Yes  Reviewed Allergies In EMR: Yes  Reviewed Surgeries / Procedures: Yes  Date of Onset of Symptoms: 03/14/2012  Treatments Tried: OTC medications  Treatments Tried Worked: No  Guideline(s) Used:  Urination Pain - Female  Disposition Per Guideline:   See Today in Office  Reason For Disposition Reached:   Painful urination AND EITHER frequency or urgency  Advice Given:  Fluids:   Drink extra fluids. Drink 8-10 glasses of liquids a day (Reason: to produce a dilute, non-irritating urine).  Warm Saline SITZ Baths to Reduce Pain:  Sit in a warm saline bath for 20 minutes to cleanse the area and to reduce pain. Add 2 oz. of table salt or baking soda to a tub of water.  Call Back If:  You become worse.  Office Follow Up:  Does the office need to follow up with this patient?: Yes  Instructions For The Office: Please follow up on script request or further instructions.  Disposition is to see in office immediately.  Patient has no transportation options.  Patient Refused Recommendation:  Patient Requests Prescription  Patient requests antibiotic.  States that she does not have transportation to get to office and is watching her granddaughter until her daughter gets home later tonight.  RN Note:  Patient states that she does not have a ride to get to the office and is watching her granddaughter. Preferred Pharmacy [facility] - [address].

## 2012-03-16 NOTE — Telephone Encounter (Signed)
She needs ov-- or UC

## 2012-03-17 ENCOUNTER — Encounter: Payer: Self-pay | Admitting: Family Medicine

## 2012-03-17 ENCOUNTER — Ambulatory Visit (INDEPENDENT_AMBULATORY_CARE_PROVIDER_SITE_OTHER): Payer: Medicare Other | Admitting: Family Medicine

## 2012-03-17 VITALS — BP 132/82 | HR 86 | Temp 97.6°F | Ht 65.0 in | Wt 185.0 lb

## 2012-03-17 DIAGNOSIS — N390 Urinary tract infection, site not specified: Secondary | ICD-10-CM

## 2012-03-17 DIAGNOSIS — R300 Dysuria: Secondary | ICD-10-CM

## 2012-03-17 LAB — POCT URINALYSIS DIPSTICK
Bilirubin, UA: NEGATIVE
Ketones, UA: NEGATIVE
pH, UA: 6

## 2012-03-17 MED ORDER — CIPROFLOXACIN HCL 500 MG PO TABS: 500.0000 mg | ORAL_TABLET | Freq: Two times a day (BID) | ORAL | Status: DC

## 2012-03-17 NOTE — Patient Instructions (Signed)
Urinary Tract Infection Urinary tract infections (UTIs) can develop anywhere along your urinary tract. Your urinary tract is your body's drainage system for removing wastes and extra water. Your urinary tract includes two kidneys, two ureters, a bladder, and a urethra. Your kidneys are a pair of bean-shaped organs. Each kidney is about the size of your fist. They are located below your ribs, one on each side of your spine. CAUSES Infections are caused by microbes, which are microscopic organisms, including fungi, viruses, and bacteria. These organisms are so small that they can only be seen through a microscope. Bacteria are the microbes that most commonly cause UTIs. SYMPTOMS  Symptoms of UTIs may vary by age and gender of the patient and by the location of the infection. Symptoms in young women typically include a frequent and intense urge to urinate and a painful, burning feeling in the bladder or urethra during urination. Older women and men are more likely to be tired, shaky, and weak and have muscle aches and abdominal pain. A fever may mean the infection is in your kidneys. Other symptoms of a kidney infection include pain in your back or sides below the ribs, nausea, and vomiting. DIAGNOSIS To diagnose a UTI, your caregiver will ask you about your symptoms. Your caregiver also will ask to provide a urine sample. The urine sample will be tested for bacteria and white blood cells. White blood cells are made by your body to help fight infection. TREATMENT  Typically, UTIs can be treated with medication. Because most UTIs are caused by a bacterial infection, they usually can be treated with the use of antibiotics. The choice of antibiotic and length of treatment depend on your symptoms and the type of bacteria causing your infection. HOME CARE INSTRUCTIONS  If you were prescribed antibiotics, take them exactly as your caregiver instructs you. Finish the medication even if you feel better after you  have only taken some of the medication.  Drink enough water and fluids to keep your urine clear or pale yellow.  Avoid caffeine, tea, and carbonated beverages. They tend to irritate your bladder.  Empty your bladder often. Avoid holding urine for long periods of time.  Empty your bladder before and after sexual intercourse.  After a bowel movement, women should cleanse from front to back. Use each tissue only once. SEEK MEDICAL CARE IF:   You have back pain.  You develop a fever.  Your symptoms do not begin to resolve within 3 days. SEEK IMMEDIATE MEDICAL CARE IF:   You have severe back pain or lower abdominal pain.  You develop chills.  You have nausea or vomiting.  You have continued burning or discomfort with urination. MAKE SURE YOU:   Understand these instructions.  Will watch your condition.  Will get help right away if you are not doing well or get worse. Document Released: 01/12/2005 Document Revised: 10/04/2011 Document Reviewed: 05/13/2011 [employer] Patient Information 2013 [employer].  

## 2012-03-17 NOTE — Progress Notes (Signed)
  Subjective:    Patient ID: [name], female    DOB: Oct 30, 1930, 78 y.o.   MRN: [id number]  HPI UTI sxs for 3 days. Then had diarrhea all day on Thursday  ( sees Dr. [name]). No fever.  + dysuria.  + pelvic pain.  Hurts to sit with pressure on her bladder.  No hematuria.  Urine looks darker than usual.  Has been trying to drink a lot of fluids.  Last UTI was about 18 months ago. No recent ABX exposure.  Last night has some low back pain but none today.     Review of Systems     Objective:   Physical Exam  Constitutional: She is oriented to person, place, and time. She appears well-developed and well-nourished.  HENT:  Head: Normocephalic and atraumatic.  Cardiovascular: Normal rate, regular rhythm and normal heart sounds.   Pulmonary/Chest: Effort normal and breath sounds normal.  Musculoskeletal:       No cva tenderness   Neurological: She is alert and oriented to person, place, and time.  Skin: Skin is warm and dry.  Psychiatric: She has a normal mood and affect. Her behavior is normal.          Assessment & Plan:  UTI - UA + and sxs consisant. Will tx with cipro.  Not enough for culture and she is uncomplicated at this point. Will need to come back for culture is not better in 3 days. Drink plenty of fluids. Can use AZo OTC for sxs relief if needed.

## 2012-04-03 ENCOUNTER — Telehealth: Payer: Self-pay | Admitting: Family Medicine

## 2012-04-03 NOTE — Telephone Encounter (Signed)
Patient Information:  Caller Name: Daniele  Phone: [phone]  Patient: [name], [name]  Gender: Female  DOB: 08-Dec-1930  Age: 78 Years  PCP: Lelon Perla.  Office Follow Up:  Does the office need to follow up with this patient?: Yes  Instructions For The Office: Please call, refuses ED appt.  RN Note:  1. Pt reports that she is ususally more sad this time of year with more crying spells due to loss of loved ones. 2. She was dx with a bladder infection the end of Nov. She took her antibiotics. She now c/o of urinary hesitancy, pressure, and sometimes she may not make it to bathroom and urine comes out. She can only get out about a tablespoon of urine when she urinates except for first morning urine. This has been going on for 3 days.  Reports she is drinking well. ED disposition for the urinating issues, patient states she is not going to ED. She would only consider seeing a female doctor, no appointments today. Pt states she can not come today anyway would need to be tomorrow afternoon. She is currently on Amoxicillin for tooth problem, started it today, wants doctor to know that as well.  Symptoms  Reason For Call & Symptoms: increased depression, urinary hesitancy, accidents, pressure  Reviewed Health History In EMR: Yes  Reviewed Medications In EMR: Yes  Reviewed Allergies In EMR: Yes  Reviewed Surgeries / Procedures: Yes  Date of Onset of Symptoms: 04/01/2012  Treatments Tried: increased fluids  Treatments Tried Worked: No  Guideline(s) Used:  Depression  Urination Pain - Female  Disposition Per Guideline:   Go to ED Now (or to Office with PCP Approval)  Reason For Disposition Reached:   Unable to urinate (or only a few drops) and bladder feels very full  Advice Given:  Reassurance:   Encourage the caller to talk about his/her problems and feelings.  Suggestions for Healthy Living  Communicate: Share how you are feeling with someone in your life who is a good listener. Make  certain that your spouse, family, or friends know how you are feeling.  Stay Active  Take a daily walk.  Call Back If:  You feel like harming yourself  You become worse.  Patient Refused Recommendation:  Patient Refused Care Advice  Pt refuses ED or appointment today. Requesting to see a female doctor tomorrow afternoon

## 2012-04-03 NOTE — Telephone Encounter (Signed)
Needs ov

## 2012-04-03 NOTE — Telephone Encounter (Signed)
msg left to call the office     KP 

## 2012-04-04 NOTE — Telephone Encounter (Signed)
Spoke with patient and she stated she felt much better today, she said she is just getting old. She is scheduled to have 2 root canals tomorrow and she is on Amox. She wanted to know would the Amox take care of it and I advised we would not know without a UA and UC, she voiced understanding and agreed to call the office if any issues.       KP

## 2012-04-18 ENCOUNTER — Other Ambulatory Visit: Payer: Self-pay | Admitting: Family Medicine

## 2012-04-19 NOTE — Telephone Encounter (Signed)
Last seen 12/23/11 and filled 02/13/12 #60. Please advise    KP

## 2012-04-20 MED ORDER — LORAZEPAM 2 MG PO TABS: ORAL_TABLET | ORAL | Status: DC

## 2012-04-20 NOTE — Addendum Note (Signed)
Addended by: Arnette Norris on: 04/20/2012 08:05 AM   Modules accepted: Orders

## 2012-04-21 IMAGING — RF DG UGI W/ SMALL BOWEL
17 series · 17 of 17 positions shown · IV contrast (agent unspecified)
Comparison: No similar prior study is available for comparison.

CLINICAL DATA: Diarrhea, abdominal pain.  Gastric outlet
obstruction.  The patient underwent endoscopy with dilatation 2
days ago.  For this reason, single contrast technique was utilized.

UPPER GI W/ SMALL BOWEL
TECHNIQUE: Upper GI series performed with thin oral barium only.
Subsequently, serial images of the small bowel were obtained
including spot views of the terminal ileum.
Fluoroscopy Time: 2 minutes 42 seconds
Contrast: Single contrast oral barium

[Series 1: run · 1 of 1 slices shown (1 of 17)]
[im 1/1]
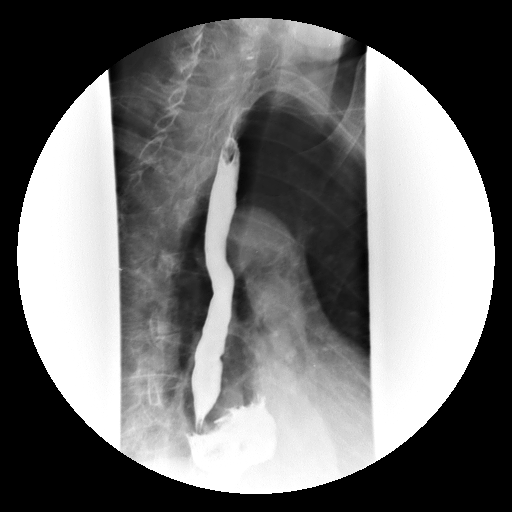

[Series 2: run · 1 of 1 slices shown (2 of 17)]
[im 1/1]
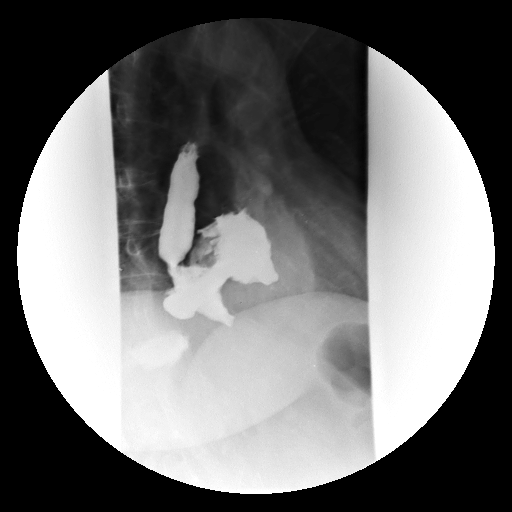

[Series 3: run · 1 of 1 slices shown (3 of 17)]
[im 1/1]
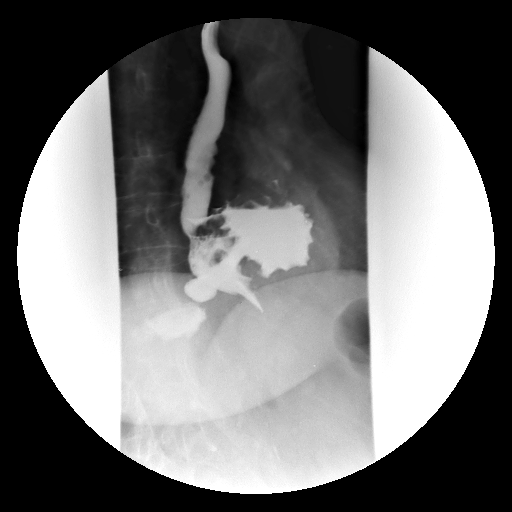

[Series 4: run · 1 of 1 slices shown (4 of 17)]
[im 1/1]
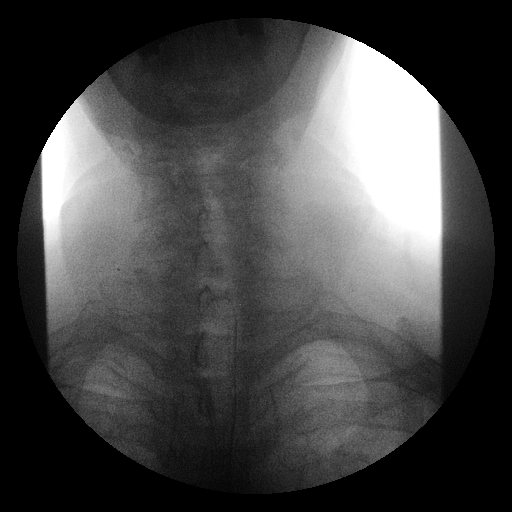

[Series 5: run · 1 of 1 slices shown (5 of 17)]
[im 1/1]
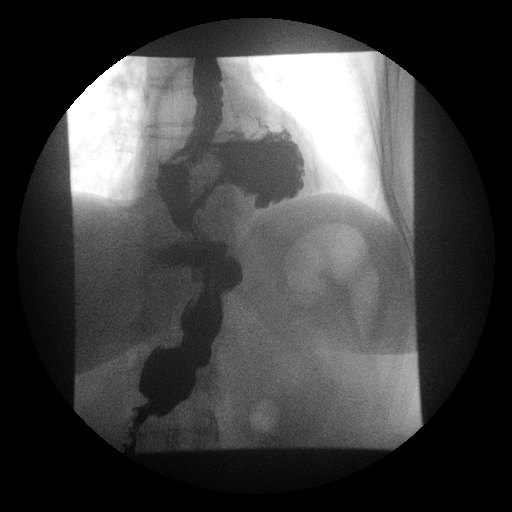

[Series 6: run · 1 of 1 slices shown (6 of 17)]
[im 1/1]
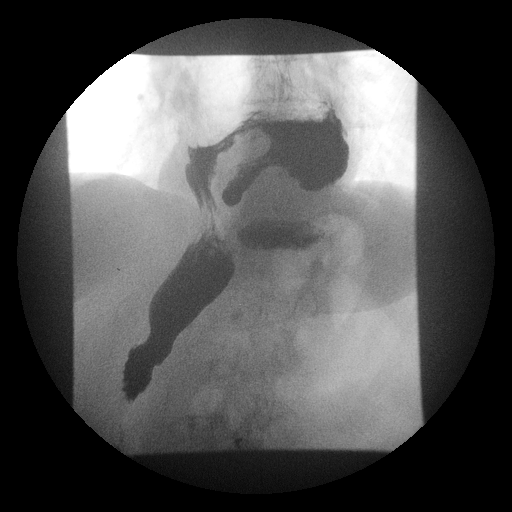

[Series 7: run · 1 of 1 slices shown (7 of 17)]
[im 1/1]
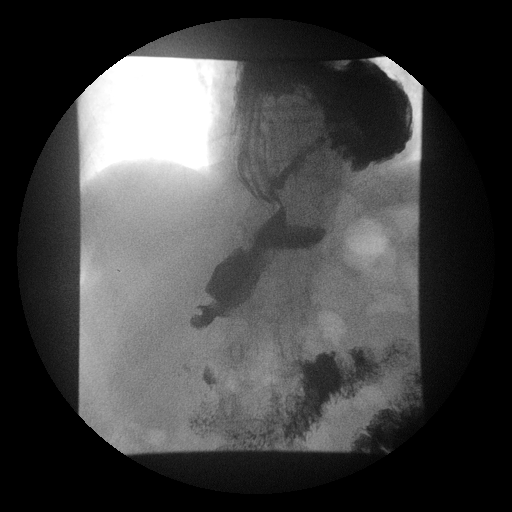

[Series 8: run · 1 of 1 slices shown (8 of 17)]
[im 1/1]
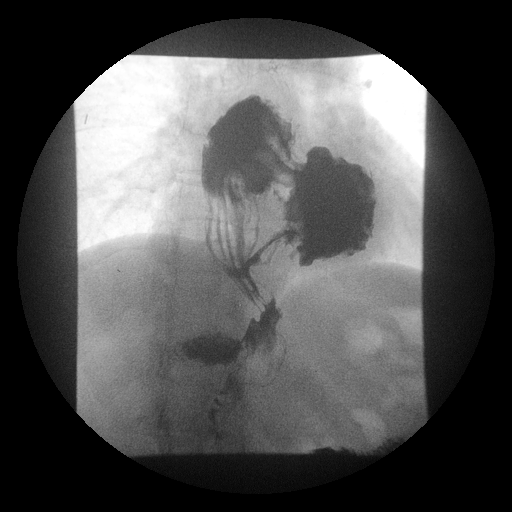

[Series 9: run · 1 of 1 slices shown (9 of 17)]
[im 1/1]
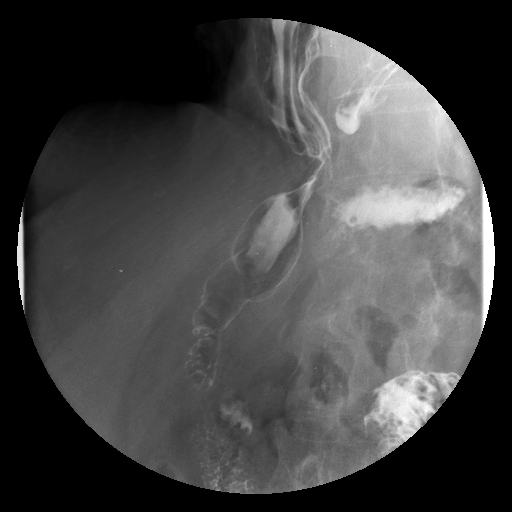

[Series 10: run · 1 of 1 slices shown (10 of 17)]
[im 1/1]
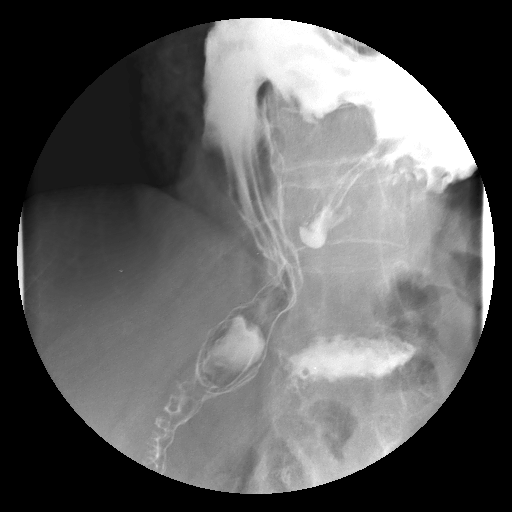

[Series 11: run · 1 of 1 slices shown (11 of 17)]
[im 1/1]
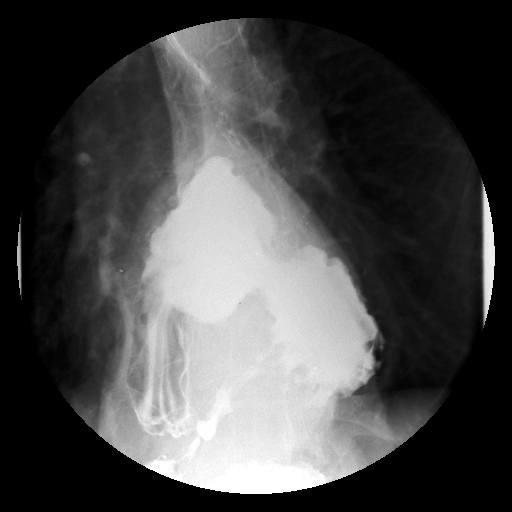

[Series 12: run · 1 of 1 slices shown (12 of 17)]
[im 1/1]
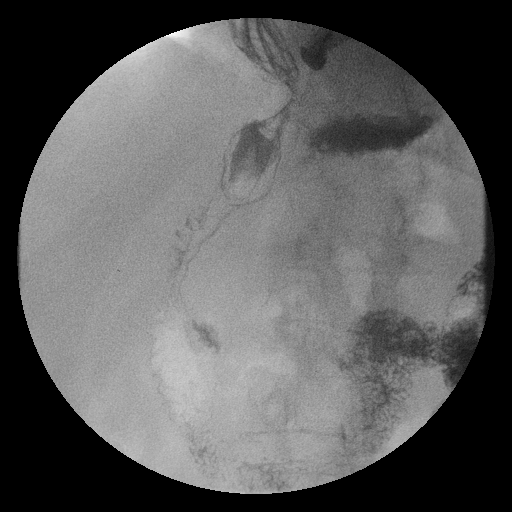

[Series 13: run · 1 of 1 slices shown (13 of 17)]
[im 1/1]
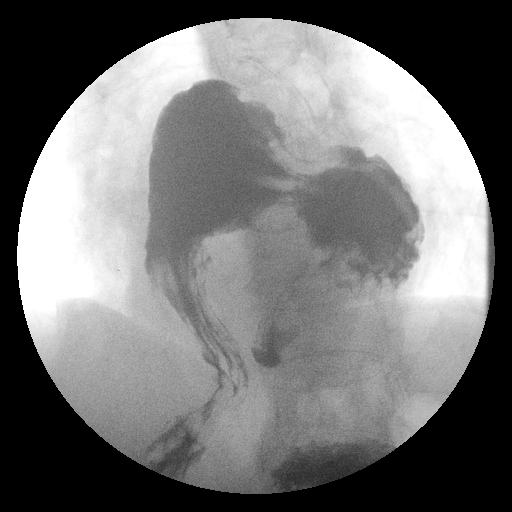

[Series 14: run · 1 of 1 slices shown (14 of 17)]
[im 1/1]
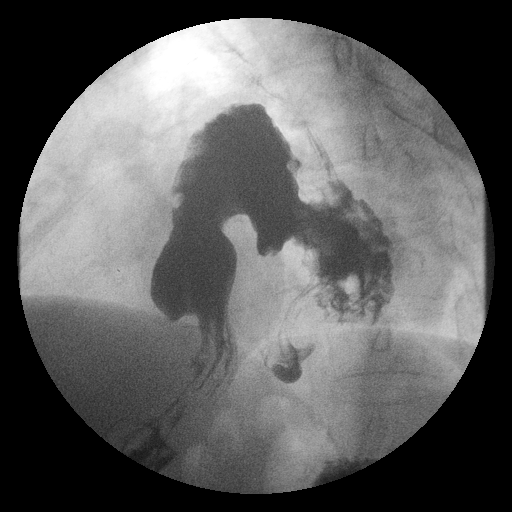

[Series 15: run · 1 of 1 slices shown (15 of 17)]
[im 1/1]
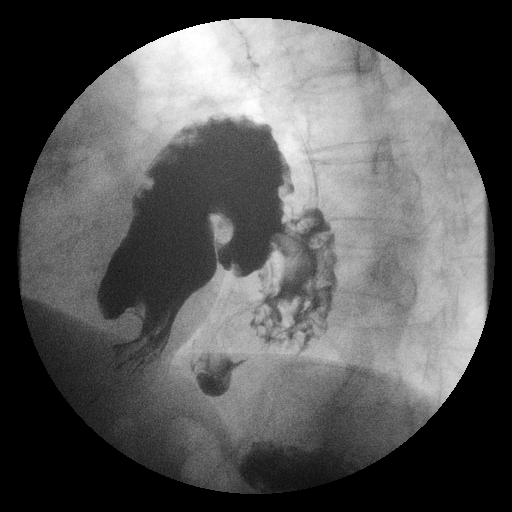

[Series 16: run · 1 of 1 slices shown (16 of 17)]
[im 1/1]
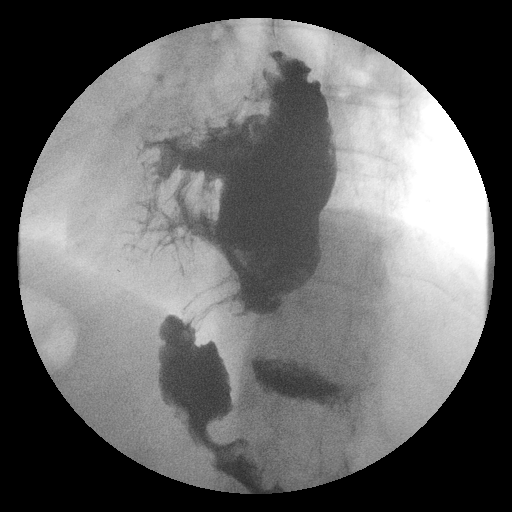

[Series 17: run · 1 of 1 slices shown (17 of 17)]
[im 1/1]
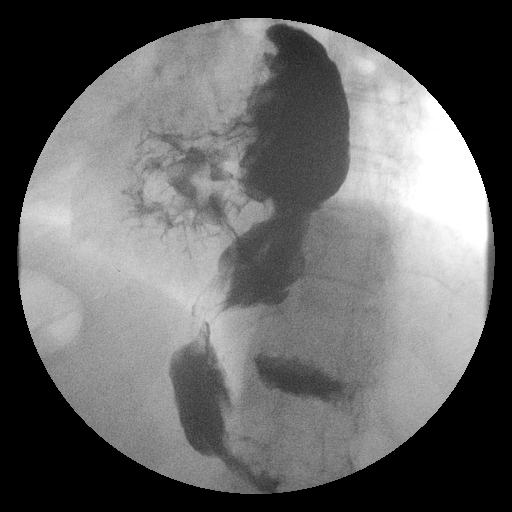

[17 of 17 positions shown; findings below may reference images not displayed]

FINDINGS: Preliminary abdominal radiograph demonstrates
cholecystectomy clips.  Normal visualized bowel gas pattern.  No
abnormal calcific opacity.  Pelvic phleboliths are present.
Evidence of  T12 vertebroplasty.

There is a large hiatal hernia with the GE junction at the level of
the diaphragmatic hiatus, and the hernia admits the stomach in an
organoaxial rotation type orientation.  Esophageal motility is
within normal limits.  Contrast passed readily through the normal
caliber esophagus into the undistended stomach, with prompt passage
into nondilated duodenum.  The ligament of Treitz is properly
located.  A small duodenal diverticulum is incidentally noted.  The
stomach contour and appearance were otherwise unremarkable by
single contrast technique.  Please note definition of mucosal
detail is suboptimal with single contrast technique.

The small bowel loops are freely mobile without evidence for
effusion, focal dilatation, or extrinsic mass effect.  The terminal
ileum is unremarkable.  Contrast passed distally into nondilated
colon.  Transit time was within normal limits.
IMPRESSION: Large hiatal hernia admitting the entire stomach, which is rotated
upon itself in an organoaxial type orientation without evidence for
obstruction at this time.

Small duodenal diverticulum.

Small bowel is unremarkable.

## 2012-04-30 ENCOUNTER — Ambulatory Visit (INDEPENDENT_AMBULATORY_CARE_PROVIDER_SITE_OTHER): Payer: Medicare Other | Admitting: Family Medicine

## 2012-04-30 ENCOUNTER — Ambulatory Visit (INDEPENDENT_AMBULATORY_CARE_PROVIDER_SITE_OTHER)
Admission: RE | Admit: 2012-04-30 | Discharge: 2012-04-30 | Disposition: A | Discharge status: Home / Self Care | Payer: Medicare Other | Source: Ambulatory Visit | Attending: Family Medicine | Admitting: Family Medicine

## 2012-04-30 ENCOUNTER — Encounter: Payer: Self-pay | Admitting: Family Medicine

## 2012-04-30 VITALS — BP 140/82 | HR 82 | Temp 98.1°F | Wt 187.0 lb

## 2012-04-30 DIAGNOSIS — K449 Diaphragmatic hernia without obstruction or gangrene: Secondary | ICD-10-CM

## 2012-04-30 DIAGNOSIS — K219 Gastro-esophageal reflux disease without esophagitis: Secondary | ICD-10-CM

## 2012-04-30 DIAGNOSIS — R05 Cough: Secondary | ICD-10-CM

## 2012-04-30 MED ORDER — PANTOPRAZOLE SODIUM 40 MG PO TBEC: 40.0000 mg | DELAYED_RELEASE_TABLET | Freq: Every day | ORAL | Status: DC

## 2012-04-30 NOTE — Patient Instructions (Addendum)
Diet for Gastroesophageal Reflux Disease, Adult  Reflux (acid reflux) is when acid from your stomach flows up into the esophagus. When acid comes in contact with the esophagus, the acid causes irritation and soreness (inflammation) in the esophagus. When reflux happens often or so severely that it causes damage to the esophagus, it is called gastroesophageal reflux disease (GERD). Nutrition therapy can help ease the discomfort of GERD.  FOODS OR DRINKS TO AVOID OR LIMIT   Smoking or chewing tobacco. Nicotine is one of the most potent stimulants to acid production in the gastrointestinal tract.   Caffeinated and decaffeinated coffee and black tea.   Regular or low-calorie carbonated beverages or energy drinks (caffeine-free carbonated beverages are allowed).    Strong spices, such as black pepper, white pepper, red pepper, cayenne, curry powder, and chili powder.   Peppermint or spearmint.   Chocolate.   High-fat foods, including meats and fried foods. Extra added fats including oils, butter, salad dressings, and nuts. Limit these to less than 8 tsp per day.   Fruits and vegetables if they are not tolerated, such as citrus fruits or tomatoes.   Alcohol.   Any food that seems to aggravate your condition.  If you have questions regarding your diet, call your caregiver or a registered dietitian.  OTHER THINGS THAT MAY HELP GERD INCLUDE:    Eating your meals slowly, in a relaxed setting.   Eating 5 to 6 small meals per day instead of 3 large meals.   Eliminating food for a period of time if it causes distress.   Not lying down until 3 hours after eating a meal.   Keeping the head of your bed raised 6 to 9 inches (15 to 23 cm) by using a foam wedge or blocks under the legs of the bed. Lying flat may make symptoms worse.   Being physically active. Weight loss may be helpful in reducing reflux in overweight or obese adults.   Wear loose fitting clothing  EXAMPLE MEAL PLAN  This meal plan is approximately  2,000 calories based on [employer] meal planning guidelines.  Breakfast    cup cooked oatmeal.   1 cup strawberries.   1 cup low-fat milk.   1 oz almonds.  Snack   1 cup cucumber slices.   6 oz yogurt (made from low-fat or fat-free milk).  Lunch   2 slice whole-wheat bread.   2 oz sliced turkey.   2 tsp mayonnaise.   1 cup blueberries.   1 cup snap peas.  Snack   6 whole-wheat crackers.   1 oz string cheese.  Dinner    cup brown rice.   1 cup mixed veggies.   1 tsp olive oil.   3 oz grilled fish.  Document Released: 04/04/2005 Document Revised: 06/27/2011 Document Reviewed: 02/18/2011  [employer] Patient Information 2013 [employer].

## 2012-04-30 NOTE — Progress Notes (Signed)
  Subjective:     [name] is a 79 y.o. female here for evaluation of a cough. Onset of symptoms was several weeks ago. Symptoms have been unchanged since that time. The cough is barky and dry and is aggravated by reclining position. Associated symptoms include: sputum production. Patient does not have a history of asthma. Patient does have a history of environmental allergens. Patient has not traveled recently. Patient does not have a history of smoking. Patient has had a previous chest x-ray. Patient has not had a PPD done.  Pt is not taking her nexium.  It was too expensive. She take periodically--pepcid.    The following portions of the patient's history were reviewed and updated as appropriate: allergies, current medications, past family history, past medical history, past social history, past surgical history and problem list.  Review of Systems Pertinent items are noted in HPI.    Objective:    Oxygen saturation 96% on room air BP 140/82  Pulse 82  Temp 98.1 F (36.7 C) (Oral)  Wt 187 lb (84.823 kg)  SpO2 96% General appearance: alert, cooperative, appears stated age and no distress Ears: normal TM's and external ear canals both ears Nose: Nares normal. Septum midline. Mucosa normal. No drainage or sinus tenderness. Throat: lips, mucosa, and tongue normal; teeth and gums normal Neck: no adenopathy, supple, symmetrical, trachea midline and thyroid not enlarged, symmetric, no tenderness/mass/nodules Lungs: clear to auscultation bilaterally Heart: S1, S2 normal Extremities: extremities normal, atraumatic, no cyanosis or edema    Assessment:    Gastroesophageal Reflux and URI with Post Nasal Drip    Plan:    Explained lack of efficacy of antibiotics in viral disease. Avoid exposure to tobacco smoke and fumes. Call if shortness of breath worsens, blood in sputum, change in character of cough, development of fever or chills, inability to maintain nutrition and hydration. Avoid  exposure to tobacco smoke and fumes. Chest x-ray. Steroid inhaler as ordered. Trial of antihistamines. Trial of proton pump inhibitor.

## 2012-06-06 ENCOUNTER — Ambulatory Visit (INDEPENDENT_AMBULATORY_CARE_PROVIDER_SITE_OTHER): Payer: Medicare Other | Admitting: Internal Medicine

## 2012-06-06 ENCOUNTER — Encounter: Payer: Self-pay | Admitting: Internal Medicine

## 2012-06-06 VITALS — BP 132/80 | HR 92 | Ht 65.0 in | Wt 187.0 lb

## 2012-06-06 DIAGNOSIS — K219 Gastro-esophageal reflux disease without esophagitis: Secondary | ICD-10-CM

## 2012-06-06 DIAGNOSIS — K5100 Ulcerative (chronic) pancolitis without complications: Secondary | ICD-10-CM

## 2012-06-06 DIAGNOSIS — R05 Cough: Secondary | ICD-10-CM

## 2012-06-06 MED ORDER — RANITIDINE HCL 300 MG PO TABS: 300.0000 mg | ORAL_TABLET | Freq: Every day | ORAL | Status: DC

## 2012-06-06 NOTE — Progress Notes (Signed)
[name] January 05, 1931 MRN [id number]   History of Present Illness:  This is a 79 year old white female with inflammatory bowel disease, currently in remission who is here today because of chronic cough which occurs during the day but not at night. She has a history of peptic ulcer disease and partial gastric outlet obstruction. She had a pyloric channel ulcer on her last upper endoscopy in October 2011. She also had an esophageal stricture which was dilated with 16 mm dilators. She has a 4 cm hiatal hernia. She has occasional dysphagia. Her medications include Protonix 40 mg in the morning. There is a history of breast cancer and family history of colon cancer in her father. Her last colonoscopy in November 2011 showed severe pancolitis with pseudopolyps. She is up-to-date on her colonoscopy. She discontinued her mesalamine about one year ago and is currently on no medications. The flareup of her colitis was attributed to the death of her husband 4 years ago.    Past Medical History  Diagnosis Date  . Hypertension   . Osteoporosis   . COPD (chronic obstructive pulmonary disease)   . Hyperlipidemia   . Anxiety   . Depression   . Breast cancer 13 years ago    s/p tamoxifen   . Peptic ulcer disease   . Hiatal hernia   . Ulcerative colitis   . GERD (gastroesophageal reflux disease)   . Hiatal hernia   . Osteoarthritis   . Diverticulosis   . Esophageal stricture   . Internal hemorrhoids   . Gastric outlet obstruction   . Rheumatic heart disease   . Glaucoma(365)    Past Surgical History  Procedure Laterality Date  . Breast surgery      right  . Cataract extraction  08/2006 and 10/2006  . Total knee arthroplasty      left  . Hemorrhoid surgery    . Tonsillectomy    . Abdominal hysterectomy      reports that she has never smoked. She has never used smokeless tobacco. She reports that she does not drink alcohol or use illicit drugs. family history includes Breast cancer in an  unspecified family member; Cirrhosis in her brother; Colon cancer (age of onset: 35) in her father; Coronary artery disease in her brother and sister; Crohn's disease in her brother; Diabetes in an unspecified family member; Hypertension in an unspecified family member; and Stroke (age of onset: 73) in her mother. Allergies  Allergen Reactions  . Meperidine Hcl     REACTION: THROAT SWELLING  . Morphine     REACTION: SWELLING  . Nitroglycerin         Review of Systems: Occasional dysphagia. Denies heartburn. Denies nocturnal regurgitation. Positive for cough  The remainder of the 10 point ROS is negative except as outlined in H&P   Physical Exam: General appearance  Well developed, in no distress. Eyes- non icteric. HEENT nontraumatic, normocephalic. Mouth no lesions, tongue papillated, no cheilosis. Neck supple without adenopathy, thyroid not enlarged, no carotid bruits, no JVD. Lungs Clear to auscultation bilaterally. Cor normal S1, normal S2, regular rhythm, no murmur,  quiet precordium. Abdomen: Soft nontender abdomen with normal active bowel sounds. Liver edge at costal margin. No distention. Rectal: Not done. Extremities no pedal edema. Skin no lesions. Neurological alert and oriented x 3. Psychological normal mood and affect.  Assessment and Plan  Problem #24 79 year old white female with a recent exacerbation of cough possibly related to gastroesophageal reflux. We need to rule out non-GI causes  such as allergies or asthmatic bronchitis. She has completed a pulmonary evaluation. We will proceed with an upper endoscopy and possible esophageal dilatation. She will continue on Protonix 40 mg in the morning and add ranitidine 300 mg at bedtime. I have discussed antireflux measures with her which will include head of the bed elevation at night.  Problem #2 Inflammatory bowel disease, currently in remission. She is on no medications for this.  Problem #3 Family history of  colorectal cancer in the patient's father. Her next colonoscopy will be due in November 2016.   06/06/2012 [name]

## 2012-06-06 NOTE — Patient Instructions (Addendum)
You have been scheduled for an endoscopy with propofol. Please follow written instructions given to you at your visit today. If you use inhalers (even only as needed) or a CPAP machine, please bring them with you on the day of your procedure.  We have sent the following medications to your pharmacy for you to pick up at your convenience: Ranitidine 300 mg every night  You should have plenty of refills on Protonix (Dr Laury Axon just prescribed 11 refills)  CC: Dr [name]  Diet for Gastroesophageal Reflux Disease, Adult Reflux (acid reflux) is when acid from your stomach flows up into the esophagus. When acid comes in contact with the esophagus, the acid causes irritation and soreness (inflammation) in the esophagus. When reflux happens often or so severely that it causes damage to the esophagus, it is called gastroesophageal reflux disease (GERD). Nutrition therapy can help ease the discomfort of GERD. FOODS OR DRINKS TO AVOID OR LIMIT  Smoking or chewing tobacco. Nicotine is one of the most potent stimulants to acid production in the gastrointestinal tract.  Caffeinated and decaffeinated coffee and black tea.  Regular or low-calorie carbonated beverages or energy drinks (caffeine-free carbonated beverages are allowed).   Strong spices, such as black pepper, white pepper, red pepper, cayenne, curry powder, and chili powder.  Peppermint or spearmint.  Chocolate.  High-fat foods, including meats and fried foods. Extra added fats including oils, butter, salad dressings, and nuts. Limit these to less than 8 tsp per day.  Fruits and vegetables if they are not tolerated, such as citrus fruits or tomatoes.  Alcohol.  Any food that seems to aggravate your condition. If you have questions regarding your diet, call your caregiver or a registered dietitian. OTHER THINGS THAT MAY HELP GERD INCLUDE:   Eating your meals slowly, in a relaxed setting.  Eating 5 to 6 small meals per day instead  of 3 large meals.  Eliminating food for a period of time if it causes distress.  Not lying down until 3 hours after eating a meal.  Keeping the head of your bed raised 6 to 9 inches (15 to 23 cm) by using a foam wedge or blocks under the legs of the bed. Lying flat may make symptoms worse.  Being physically active. Weight loss may be helpful in reducing reflux in overweight or obese adults.  Wear loose fitting clothing EXAMPLE MEAL PLAN This meal plan is approximately 2,000 calories based on https://www.bernard.org/ meal planning guidelines. Breakfast   cup cooked oatmeal.  1 cup strawberries.  1 cup low-fat milk.  1 oz almonds. Snack  1 cup cucumber slices.  6 oz yogurt (made from low-fat or fat-free milk). Lunch  2 slice whole-wheat bread.  2 oz sliced [place].  2 tsp mayonnaise.  1 cup blueberries.  1 cup snap peas. Snack  6 whole-wheat crackers.  1 oz string cheese. Dinner   cup brown rice.  1 cup mixed veggies.  1 tsp olive oil.  3 oz grilled fish. Document Released: 04/04/2005 Document Revised: 06/27/2011 Document Reviewed: 02/18/2011 [facility] Patient Information 2013 [place], [place].    Gastroesophageal Reflux Disease, Adult Gastroesophageal reflux disease (GERD) happens when acid from your stomach flows up into the esophagus. When acid comes in contact with the esophagus, the acid causes soreness (inflammation) in the esophagus. Over time, GERD may create small holes (ulcers) in the lining of the esophagus. CAUSES   Increased body weight. This puts pressure on the stomach, making acid rise from the stomach into the  esophagus.  Smoking. This increases acid production in the stomach.  Drinking alcohol. This causes decreased pressure in the lower esophageal sphincter (valve or ring of muscle between the esophagus and stomach), allowing acid from the stomach into the esophagus.  Late evening meals and a full stomach. This increases pressure and acid  production in the stomach.  A malformed lower esophageal sphincter. Sometimes, no cause is found. SYMPTOMS   Burning pain in the lower part of the mid-chest behind the breastbone and in the mid-stomach area. This may occur twice a week or more often.  Trouble swallowing.  Sore throat.  Dry cough.  Asthma-like symptoms including chest tightness, shortness of breath, or wheezing. DIAGNOSIS  Your caregiver may be able to diagnose GERD based on your symptoms. In some cases, X-rays and other tests may be done to check for complications or to check the condition of your stomach and esophagus. TREATMENT  Your caregiver may recommend over-the-counter or prescription medicines to help decrease acid production. Ask your caregiver before starting or adding any new medicines.  HOME CARE INSTRUCTIONS   Change the factors that you can control. Ask your caregiver for guidance concerning weight loss, quitting smoking, and alcohol consumption.  Avoid foods and drinks that make your symptoms worse, such as:  Caffeine or alcoholic drinks.  Chocolate.  Peppermint or mint flavorings.  Garlic and onions.  Spicy foods.  Citrus fruits, such as oranges, lemons, or limes.  Tomato-based foods such as sauce, chili, salsa, and pizza.  Fried and fatty foods.  Avoid lying down for the 3 hours prior to your bedtime or prior to taking a nap.  Eat small, frequent meals instead of large meals.  Wear loose-fitting clothing. Do not wear anything tight around your waist that causes pressure on your stomach.  Raise the head of your bed 6 to 8 inches with wood blocks to help you sleep. Extra pillows will not help.  Only take over-the-counter or prescription medicines for pain, discomfort, or fever as directed by your caregiver.  Do not take aspirin, ibuprofen, or other nonsteroidal anti-inflammatory drugs (NSAIDs). SEEK IMMEDIATE MEDICAL CARE IF:   You have pain in your arms, neck, jaw, teeth, or  back.  Your pain increases or changes in intensity or duration.  You develop nausea, vomiting, or sweating (diaphoresis).  You develop shortness of breath, or you faint.  Your vomit is green, yellow, black, or looks like coffee grounds or blood.  Your stool is red, bloody, or black. These symptoms could be signs of other problems, such as heart disease, gastric bleeding, or esophageal bleeding. MAKE SURE YOU:   Understand these instructions.  Will watch your condition.  Will get help right away if you are not doing well or get worse. Document Released: 01/12/2005 Document Revised: 06/27/2011 Document Reviewed: 10/22/2010 [facility] Patient Information 2013 [place], [place].  Dr Laury Axon

## 2012-06-07 ENCOUNTER — Other Ambulatory Visit: Payer: Self-pay | Admitting: Family Medicine

## 2012-06-07 NOTE — Telephone Encounter (Signed)
Last seen 04/30/12 and filled 04/20/12 # 60. Please advise    KP

## 2012-06-08 MED ORDER — LORAZEPAM 2 MG PO TABS: ORAL_TABLET | ORAL | Status: DC

## 2012-06-08 NOTE — Addendum Note (Signed)
Addended by: Arnette Norris on: 06/08/2012 08:53 AM   Modules accepted: Orders

## 2012-06-20 ENCOUNTER — Ambulatory Visit (AMBULATORY_SURGERY_CENTER): Payer: Medicare Other | Admitting: Internal Medicine

## 2012-06-20 ENCOUNTER — Telehealth: Payer: Self-pay | Admitting: *Deleted

## 2012-06-20 ENCOUNTER — Other Ambulatory Visit: Payer: Self-pay | Admitting: *Deleted

## 2012-06-20 ENCOUNTER — Encounter: Payer: Self-pay | Admitting: Internal Medicine

## 2012-06-20 VITALS — BP 167/81 | HR 65 | Temp 99.1°F | Resp 22 | Ht 65.0 in | Wt 187.0 lb

## 2012-06-20 DIAGNOSIS — D131 Benign neoplasm of stomach: Secondary | ICD-10-CM

## 2012-06-20 DIAGNOSIS — K219 Gastro-esophageal reflux disease without esophagitis: Secondary | ICD-10-CM

## 2012-06-20 MED ORDER — SODIUM CHLORIDE 0.9 % IV SOLN: 500.0000 mL | INTRAVENOUS | Status: DC

## 2012-06-20 NOTE — Telephone Encounter (Signed)
Scheduled patient for barium esophagram and UGI series at [facility]. On 06/27/12 at 9:30 AM Elnita Maxwell). Ceclia in recovery room given information to give patient.

## 2012-06-20 NOTE — Patient Instructions (Addendum)

## 2012-06-20 NOTE — Op Note (Signed)
[facility] [address] [place], [address]   ENDOSCOPY PROCEDURE REPORT  PATIENT: [name], [name]  MR#: [id number] BIRTHDATE: 05/21/1930 , 81  yrs. old GENDER: Female ENDOSCOPIST: Hart Carwin, MD REFERRED BY:  [name], DO PROCEDURE DATE:  06/20/2012 PROCEDURE:  EGD w/ biopsy ASA CLASS:     Class II INDICATIONS:  Dysphagia.   hx 4 cm hiatal hernia, last EGD 01/2010- pyloric channel ulcer, stricture, dilated, now recurrent dysphagia.  MEDICATIONS: MAC sedation, administered by CRNA and Propofol (Diprivan) 230 mg IV TOPICAL ANESTHETIC: Cetacaine Spray  DESCRIPTION OF PROCEDURE: After the risks benefits and alternatives of the procedure were thoroughly explained, informed consent was obtained.  The LB [id number]-H180 [id number] endoscope was introduced through the mouth and advanced to the second portion of the duodenum. Without limitations.  The instrument was slowly withdrawn as the mucosa was fully examined.      Esophagus the proximal and mid esophagus appeared normal in the distal esophagus the lumen was somewhat angulated and now I had difficulty traversing into the stomach. There was a large hiatal hernia measuring at least 6-7 cm development of paraesophageal hernia area retroflexion of the endoscope within the hernia revealed the sedation all of the gastric cardia into of the diaphragm separate from diaphragmatic hiatus suggesting paraesophageal hernia it was very difficult to advance into the gastric antrum because of the displacement of the stomach will resolve slowly. But this was finally accomplished.  Stomach gastric antrum was deformed pyloric channel was deformed as well there was a superficial 5 mm pyloric channel ulcer in approximately the same location as found 2 years ago. Biopsies were taken there was no obstruction  Duodenum duodenal bulb and descending duodenum was unremarkable[        The scope was then withdrawn from the  patient and the procedure completed.  COMPLICATIONS: There were no complications. ENDOSCOPIC IMPRESSION: large hiatal hernia at least 6-7 c,nonreducible suspect paraesophageal hernia Pyloric channel ulcer status post biopsies Mild nonobstructing distal esophageal stricture not dilated probably not significant  RECOMMENDATIONS: Await pathology results barium esophagram and upper GI series to delineate the anatomy at the diaphragmatic hiatus Continue PPIs Strict antireflux measures Followup office visit   REPEAT EXAM: for EGD pending biopsy results.  eSigned:  Hart Carwin, MD 06/20/2012 4:46 PM   CC:  PATIENT NAME:  [name], [name] MR#: [id number]

## 2012-06-20 NOTE — Progress Notes (Signed)
Patient did not experience any of the following events: a burn prior to discharge; a fall within the facility; wrong site/side/patient/procedure/implant event; or a hospital transfer or hospital admission upon discharge from the facility. (G8907) Patient did not have preoperative order for IV antibiotic SSI prophylaxis. (G8918)  

## 2012-06-20 NOTE — Progress Notes (Signed)
Bp cuff on patients lower right leg, patient moving pressure on cuff producing aberrent readings. Report given to pacu rn, vss, bbs=clear

## 2012-06-20 NOTE — Progress Notes (Signed)
Called to room to assist during endoscopic procedure.  Patient ID and intended procedure confirmed with present staff. Received instructions for my participation in the procedure from the performing physician. ewm 

## 2012-06-21 ENCOUNTER — Encounter: Payer: Self-pay | Admitting: *Deleted

## 2012-06-21 ENCOUNTER — Telehealth: Payer: Self-pay | Admitting: *Deleted

## 2012-06-21 NOTE — Telephone Encounter (Signed)
  Follow up Call-  Call back number 06/20/2012  Post procedure Call Back phone  # [phone]  Permission to leave phone message Yes     Patient questions:  Do you have a fever, pain , or abdominal swelling? no Pain Score  0 *  Have you tolerated food without any problems? yes  Have you been able to return to your normal activities? yes  Do you have any questions about your discharge instructions: Diet   no Medications  no Follow up visit  no  Do you have questions or concerns about your Care? no  Actions: * If pain score is 4 or above: No action needed, pain <4.

## 2012-06-26 ENCOUNTER — Encounter: Payer: Self-pay | Admitting: Internal Medicine

## 2012-06-27 ENCOUNTER — Ambulatory Visit (HOSPITAL_COMMUNITY)
Admission: RE | Admit: 2012-06-27 | Discharge: 2012-06-27 | Disposition: A | Discharge status: Home / Self Care | Payer: Medicare Other | Source: Ambulatory Visit | Attending: Internal Medicine | Admitting: Internal Medicine

## 2012-06-27 ENCOUNTER — Other Ambulatory Visit: Payer: Self-pay | Admitting: Internal Medicine

## 2012-06-27 DIAGNOSIS — K449 Diaphragmatic hernia without obstruction or gangrene: Secondary | ICD-10-CM | POA: Insufficient documentation

## 2012-06-27 DIAGNOSIS — Z01818 Encounter for other preprocedural examination: Secondary | ICD-10-CM | POA: Insufficient documentation

## 2012-06-29 ENCOUNTER — Ambulatory Visit (HOSPITAL_COMMUNITY)
Admission: RE | Admit: 2012-06-29 | Discharge: 2012-06-29 | Disposition: A | Discharge status: Home / Self Care | Payer: Medicare Other | Source: Ambulatory Visit | Attending: Internal Medicine | Admitting: Internal Medicine

## 2012-06-29 ENCOUNTER — Other Ambulatory Visit (HOSPITAL_COMMUNITY): Payer: Medicare Other

## 2012-06-29 DIAGNOSIS — K449 Diaphragmatic hernia without obstruction or gangrene: Secondary | ICD-10-CM | POA: Insufficient documentation

## 2012-06-29 DIAGNOSIS — R1110 Vomiting, unspecified: Secondary | ICD-10-CM | POA: Insufficient documentation

## 2012-07-04 ENCOUNTER — Encounter: Payer: Self-pay | Admitting: Internal Medicine

## 2012-07-04 ENCOUNTER — Ambulatory Visit (INDEPENDENT_AMBULATORY_CARE_PROVIDER_SITE_OTHER): Payer: Medicare Other | Admitting: Internal Medicine

## 2012-07-04 VITALS — BP 146/96 | HR 80 | Ht 65.0 in | Wt 186.0 lb

## 2012-07-04 DIAGNOSIS — R1319 Other dysphagia: Secondary | ICD-10-CM

## 2012-07-04 DIAGNOSIS — K449 Diaphragmatic hernia without obstruction or gangrene: Secondary | ICD-10-CM

## 2012-07-04 DIAGNOSIS — R933 Abnormal findings on diagnostic imaging of other parts of digestive tract: Secondary | ICD-10-CM

## 2012-07-04 NOTE — Progress Notes (Signed)
[name] 1930-06-06 MRN [id number]   History of Present Illness:  This is a 79 year old white female with pain substernally and dysphagia. She had a pyloric channel ulcer on a recent upper endoscopy and displacement of the stomach into the chest cavity through paraesophageal and hiatal hernia. A recent barium esophagram and upper GI series showed a large hiatal and paraesophageal hernia with displacement of the stomach into the chest. Compared to October 2011 and 2005, the abnormalities were compatible but the hernias were somewhat larger. The body and the antrum of the stomach were above the pylorus and gastric fundus. Pylorus was just below the diaphragm. There was a mucosal irregularity corresponding with pyloric channel ulcer. The esophagus was displaced laterally by a large hiatal hernia. Patient continues to have problems with swallowing. She is able to eat only a small amount of food and has been having chronic cough at night and during the day. She has been on Prtonix 40 mg in the morning and ranitidine 300 mg at bedtime. She discontinued Carafate. She has a diagnosis of ulcerative colitis after her husband passed away 2 years ago requiring hospitalization and high-dose steroids. She is currently in remission. During recent endoscopy, the esophageal stricture was mild, and it was not dilated due to danger of perforation due to stomach displacement and horizontal orientation.   Past Medical History  Diagnosis Date  . Hypertension   . Osteoporosis   . COPD (chronic obstructive pulmonary disease)   . Hyperlipidemia   . Anxiety   . Depression   . Breast cancer 13 years ago    s/p tamoxifen   . Peptic ulcer disease   . Hiatal hernia   . Ulcerative colitis   . GERD (gastroesophageal reflux disease)   . Hiatal hernia   . Osteoarthritis   . Diverticulosis   . Esophageal stricture   . Internal hemorrhoids   . Gastric outlet obstruction   . Rheumatic heart disease   . Glaucoma(365)     Past Surgical History  Procedure Laterality Date  . Breast surgery      right  . Cataract extraction  08/2006 and 10/2006  . Total knee arthroplasty      left  . Hemorrhoid surgery    . Tonsillectomy    . Abdominal hysterectomy      reports that she has never smoked. She has never used smokeless tobacco. She reports that she does not drink alcohol or use illicit drugs. family history includes Breast cancer in an unspecified family member; Cirrhosis in her brother; Colon cancer (age of onset: 84) in her father; Coronary artery disease in her brother and sister; Crohn's disease in her brother; Diabetes in an unspecified family member; Hypertension in an unspecified family member; and Stroke (age of onset: 31) in her mother. Allergies  Allergen Reactions  . Demerol (Meperidine)   . Meperidine Hcl     REACTION: THROAT SWELLING  . Morphine     REACTION: SWELLING  . Nitroglycerin         Review of Systems: Positive for dysphagia chest pain indigestion  The remainder of the 10 point ROS is negative except as outlined in H&P   Physical Exam: General appearance  Well developed, in no distress. Psychological normal mood and affect.  Assessment and Plan:  Problem #1 Large paraesophageal and esophageal hernia with more than 50% of the stomach above the diaphragm causing gastric emptying delay and esophageal stricture. She has nocturnal aspiration and resulting cough. The changes have been  there for several years but are getting worse. She is physically in good health and I think she would be a suitable candidate for general anesthesia. I will refer her for surgical consultation and consideration of reduction of the large paraesophageal and esophageal hernia.  Problem #2 U.C, currently in remission   07/04/2012 [name]

## 2012-07-04 NOTE — Patient Instructions (Addendum)
You have been scheduled for an appointment with Dr Luretha Murphy at [facility]. Your appointment is on ______ at ______. Please arrive at ________ for registration. Make certain to bring a list of current medications, including any over the counter medications or vitamins. Also bring your co-pay if you have one as well as your insurance cards. [facility] is located at [address]. Should you need to reschedule your appointment, please contact them at [phone].  Cc: Dr [name]

## 2012-07-09 ENCOUNTER — Encounter: Payer: Self-pay | Admitting: Lab

## 2012-07-09 ENCOUNTER — Encounter: Payer: Self-pay | Admitting: Family Medicine

## 2012-07-09 ENCOUNTER — Ambulatory Visit (INDEPENDENT_AMBULATORY_CARE_PROVIDER_SITE_OTHER): Payer: Medicare Other | Admitting: Family Medicine

## 2012-07-09 VITALS — BP 100/72 | HR 89 | Temp 98.0°F | Ht 65.0 in | Wt 185.0 lb

## 2012-07-09 DIAGNOSIS — J4489 Other specified chronic obstructive pulmonary disease: Secondary | ICD-10-CM

## 2012-07-09 DIAGNOSIS — M810 Age-related osteoporosis without current pathological fracture: Secondary | ICD-10-CM

## 2012-07-09 DIAGNOSIS — I10 Essential (primary) hypertension: Secondary | ICD-10-CM

## 2012-07-09 DIAGNOSIS — Z0000 Encounter for general adult medical examination without abnormal findings: Secondary | ICD-10-CM

## 2012-07-09 DIAGNOSIS — E785 Hyperlipidemia, unspecified: Secondary | ICD-10-CM

## 2012-07-09 DIAGNOSIS — J449 Chronic obstructive pulmonary disease, unspecified: Secondary | ICD-10-CM

## 2012-07-09 DIAGNOSIS — F418 Other specified anxiety disorders: Secondary | ICD-10-CM

## 2012-07-09 LAB — POCT URINALYSIS DIPSTICK
Blood, UA: NEGATIVE
Glucose, UA: NEGATIVE
Nitrite, UA: NEGATIVE
Spec Grav, UA: 1.01
Urobilinogen, UA: 0.2
pH, UA: 5

## 2012-07-09 MED ORDER — ROSUVASTATIN CALCIUM 20 MG PO TABS: 20.0000 mg | ORAL_TABLET | Freq: Every day | ORAL | Status: DC

## 2012-07-09 MED ORDER — ZOSTER VACCINE LIVE 19400 UNT/0.65ML ~~LOC~~ SOLR: 0.6500 mL | Freq: Once | SUBCUTANEOUS | Status: DC

## 2012-07-09 MED ORDER — ESCITALOPRAM OXALATE 20 MG PO TABS: 20.0000 mg | ORAL_TABLET | Freq: Every day | ORAL | Status: DC

## 2012-07-09 MED ORDER — HYDROCHLOROTHIAZIDE 25 MG PO TABS: 25.0000 mg | ORAL_TABLET | Freq: Every day | ORAL | Status: DC

## 2012-07-09 NOTE — Assessment & Plan Note (Signed)
Stable Cont meds 

## 2012-07-09 NOTE — Assessment & Plan Note (Signed)
Check labs 

## 2012-07-09 NOTE — Progress Notes (Signed)
Subjective:    [name] is a 79 y.o. female who presents for Medicare Annual/Subsequent preventive examination.  Preventive Screening-Counseling & Management  Tobacco History  Smoking status  . Never Smoker   Smokeless tobacco  . Never Used     Problems Prior to Visit 1. Nothing new  Current Problems (verified) Patient Active Problem List  Diagnosis  . HYPERLIPIDEMIA  . ANXIETY  . Anxiety and depression  . GLAUCOMA  . DECREASED HEARING, BILATERAL  . RHEUMATIC HEART DISEASE  . HYPERTENSION  . PREMATURE VENTRICULAR CONTRACTIONS  . HEMORRHOIDS-INTERNAL  . COPD  . ESOPHAGEAL STRICTURE  . GERD  . DUODENAL ULCER  . PEPTIC ULCER DISEASE  . GASTRIC OUTLET OBSTRUCTION  . HIATAL HERNIA  . DIVERTICULOSIS, COLON  . OSTEOARTHRITIS  . OSTEOPOROSIS  . Personal History of Other Diseases of Circulatory System  . MASTECTOMY, RIGHT, HX OF  . TOTAL HYSTERECTOMY AND BILATERAL SALPINGOOPHERECTOMY, HX OF  . TONSILLECTOMY AND ADENOIDECTOMY, HX OF  . ASYMPTOMATIC POSTMENOPAUSAL STATUS  . OTH SPEC ALVEOL&PARIETOALVEOL PNEUMONOPATHIES  . UTI  . HYPERGLYCEMIA  . Carotid arterial disease  . Urinary frequency    Medications Prior to Visit Current Outpatient Prescriptions on File Prior to Visit  Medication Sig Dispense Refill  . aspirin 81 MG tablet Take 81 mg by mouth daily.        . Aspirin-Salicylamide-Caffeine (BC HEADACHE) 325-95-16 MG TABS Take 1 tablet by mouth as needed.        [name] azelastine (ASTELIN) 137 MCG/SPRAY nasal spray Place 2 sprays into the nose 2 (two) times daily. Use in each nostril as directed  30 mL  12  . escitalopram (LEXAPRO) 20 MG tablet Take 1 tablet (20 mg total) by mouth daily.  30 tablet  5  . Iron 66 MG TABS Take 65 mg by mouth daily.        [name] loratadine (CLARITIN) 10 MG tablet Take 1 tablet (10 mg total) by mouth daily.  30 tablet  11  . LORazepam (ATIVAN) 2 MG tablet TAKE 1/2 TABLET BY MOUTH EVERY 8 HOURS AS NEEDED FOR ANXIETY  60 tablet  0  .  Multiple Vitamins-Minerals (WOMENS MULTI VITAMIN & MINERAL PO) Take 1,000 mg by mouth daily.        . pantoprazole (PROTONIX) 40 MG tablet Take 1 tablet (40 mg total) by mouth daily.  30 tablet  11  . PATADAY 0.2 % SOLN Place 1 drop into both eyes daily.      . ranitidine (ZANTAC) 300 MG tablet Take 1 tablet (300 mg total) by mouth at bedtime.  30 tablet  1  . RESTASIS 0.05 % ophthalmic emulsion       . rosuvastatin (CRESTOR) 20 MG tablet Take 1 tablet (20 mg total) by mouth daily.  30 tablet  5  . sucralfate (CARAFATE) 1 G tablet Take 1 g by mouth 2 (two) times daily as needed.       . vitamin B-12 (CYANOCOBALAMIN) 1000 MCG tablet Take 1,000 mcg by mouth daily.         No current facility-administered medications on file prior to visit.    Current Medications (verified) Current Outpatient Prescriptions  Medication Sig Dispense Refill  . aspirin 81 MG tablet Take 81 mg by mouth daily.        . Aspirin-Salicylamide-Caffeine (BC HEADACHE) 325-95-16 MG TABS Take 1 tablet by mouth as needed.        [name] azelastine (ASTELIN) 137 MCG/SPRAY nasal spray Place 2 sprays  into the nose 2 (two) times daily. Use in each nostril as directed  30 mL  12  . escitalopram (LEXAPRO) 20 MG tablet Take 1 tablet (20 mg total) by mouth daily.  30 tablet  5  . Iron 66 MG TABS Take 65 mg by mouth daily.        [name] loratadine (CLARITIN) 10 MG tablet Take 1 tablet (10 mg total) by mouth daily.  30 tablet  11  . LORazepam (ATIVAN) 2 MG tablet TAKE 1/2 TABLET BY MOUTH EVERY 8 HOURS AS NEEDED FOR ANXIETY  60 tablet  0  . Multiple Vitamins-Minerals (WOMENS MULTI VITAMIN & MINERAL PO) Take 1,000 mg by mouth daily.        . pantoprazole (PROTONIX) 40 MG tablet Take 1 tablet (40 mg total) by mouth daily.  30 tablet  11  . PATADAY 0.2 % SOLN Place 1 drop into both eyes daily.      . ranitidine (ZANTAC) 300 MG tablet Take 1 tablet (300 mg total) by mouth at bedtime.  30 tablet  1  . RESTASIS 0.05 % ophthalmic emulsion       .  rosuvastatin (CRESTOR) 20 MG tablet Take 1 tablet (20 mg total) by mouth daily.  30 tablet  5  . sucralfate (CARAFATE) 1 G tablet Take 1 g by mouth 2 (two) times daily as needed.       . vitamin B-12 (CYANOCOBALAMIN) 1000 MCG tablet Take 1,000 mcg by mouth daily.         No current facility-administered medications for this visit.     Allergies (verified) Demerol; Meperidine hcl; Morphine; and Nitroglycerin   PAST HISTORY  Family History Family History  Problem Relation Age of Onset  . Coronary artery disease Sister     x 2 in 45's  . Coronary artery disease Brother     x 2 in 70's  . Diabetes    . Colon cancer Father 56  . Hypertension    . Stroke Mother 74  . Crohn's disease Brother   . Breast cancer      Aunt  . Cirrhosis Brother     Social History History  Substance Use Topics  . Smoking status: Never Smoker   . Smokeless tobacco: Never Used  . Alcohol Use: No     Are there smokers in your home (other than you)? No  Risk Factors Current exercise habits: walking  Dietary issues discussed: na   Cardiac risk factors: advanced age (older than 42 for men, 67 for women), dyslipidemia and hypertension.  Depression Screen (Note: if answer to either of the following is "Yes", a more complete depression screening is indicated)   Over the past two weeks, have you felt down, depressed or hopeless? No  Over the past two weeks, have you felt little interest or pleasure in doing things? No  Have you lost interest or pleasure in daily life? No  Do you often feel hopeless? No  Do you cry easily over simple problems? Yes  Activities of Daily Living In your present state of health, do you have any difficulty performing the following activities?:  Driving? No Managing money?  No Feeding yourself? No Getting from bed to chair? No Climbing a flight of stairs? No Preparing food and eating?: No Bathing or showering? No Getting dressed: No Getting to the toilet? No Using  the toilet:No Moving around from place to place: No In the past year have you fallen or had a near fall?:No  Are you sexually active?  No  Do you have more than one partner?  No  Hearing Difficulties: No Do you often ask people to speak up or repeat themselves? No Do you experience ringing or noises in your ears? No Do you have difficulty understanding soft or whispered voices? No   Do you feel that you have a problem with memory? No  Do you often misplace items? No  Do you feel safe at home?  Yes  Cognitive Testing  Alert? Yes  Normal Appearance?Yes  Oriented to person? Yes  Place? Yes   Time? Yes  Recall of three objects?  Yes  Can perform simple calculations? Yes  Displays appropriate judgment?Yes  Can read the correct time from a watch face?Yes   Advanced Directives have been discussed with the patient? Yes  List the Names of Other Physician/Practitioners you currently use: 1.  brodie-  Indicate any recent Medical Services you may have received from other than [facility] providers in the past year (date may be approximate).  Immunization History  Administered Date(s) Administered  . Influenza Whole 04/02/2007, 01/02/2009, 01/06/2010, 01/10/2012  . Pneumococcal Polysaccharide 01/30/2008    Screening Tests Health Maintenance  Topic Date Due  . Tetanus/tdap  01/18/1952  . Zostavax  01/17/1993  . Mammogram  10/24/2012  . Influenza Vaccine  12/17/2012  . Colonoscopy  05/28/2017  . Pneumococcal Polysaccharide Vaccine Age 57 And Over  Completed    All answers were reviewed with the patient and necessary referrals were made:  [name], DO   07/09/2012   History reviewed:  She  has a past medical history of Hypertension; Osteoporosis; COPD (chronic obstructive pulmonary disease); Hyperlipidemia; Anxiety; Depression; Breast cancer (13 years ago); Peptic ulcer disease; Hiatal hernia; Ulcerative colitis; GERD (gastroesophageal reflux disease); Hiatal hernia;  Osteoarthritis; Diverticulosis; Esophageal stricture; Internal hemorrhoids; Gastric outlet obstruction; Rheumatic heart disease; and Glaucoma(365). She  does not have any pertinent problems on file. She  has past surgical history that includes Breast surgery; Cataract extraction (08/2006 and 10/2006); Total knee arthroplasty; Hemorrhoid surgery; Tonsillectomy; and Abdominal hysterectomy. Her family history includes Breast cancer in an unspecified family member; Cirrhosis in her brother; Colon cancer (age of onset: 70) in her father; Coronary artery disease in her brother and sister; Crohn's disease in her brother; Diabetes in an unspecified family member; Hypertension in an unspecified family member; and Stroke (age of onset: 15) in her mother. She  reports that she has never smoked. She has never used smokeless tobacco. She reports that she does not drink alcohol or use illicit drugs. She has a current medication list which includes the following prescription(s): aspirin, aspirin-salicylamide-caffeine, azelastine, escitalopram, iron, loratadine, lorazepam, multiple vitamins-minerals, pantoprazole, pataday, ranitidine, restasis, rosuvastatin, sucralfate, and vitamin b-12. Current Outpatient Prescriptions on File Prior to Visit  Medication Sig Dispense Refill  . aspirin 81 MG tablet Take 81 mg by mouth daily.        . Aspirin-Salicylamide-Caffeine (BC HEADACHE) 325-95-16 MG TABS Take 1 tablet by mouth as needed.        [name] azelastine (ASTELIN) 137 MCG/SPRAY nasal spray Place 2 sprays into the nose 2 (two) times daily. Use in each nostril as directed  30 mL  12  . escitalopram (LEXAPRO) 20 MG tablet Take 1 tablet (20 mg total) by mouth daily.  30 tablet  5  . Iron 66 MG TABS Take 65 mg by mouth daily.        [name] loratadine (CLARITIN) 10 MG tablet Take 1 tablet (10  mg total) by mouth daily.  30 tablet  11  . LORazepam (ATIVAN) 2 MG tablet TAKE 1/2 TABLET BY MOUTH EVERY 8 HOURS AS NEEDED FOR ANXIETY  60 tablet   0  . Multiple Vitamins-Minerals (WOMENS MULTI VITAMIN & MINERAL PO) Take 1,000 mg by mouth daily.        . pantoprazole (PROTONIX) 40 MG tablet Take 1 tablet (40 mg total) by mouth daily.  30 tablet  11  . PATADAY 0.2 % SOLN Place 1 drop into both eyes daily.      . ranitidine (ZANTAC) 300 MG tablet Take 1 tablet (300 mg total) by mouth at bedtime.  30 tablet  1  . RESTASIS 0.05 % ophthalmic emulsion       . rosuvastatin (CRESTOR) 20 MG tablet Take 1 tablet (20 mg total) by mouth daily.  30 tablet  5  . sucralfate (CARAFATE) 1 G tablet Take 1 g by mouth 2 (two) times daily as needed.       . vitamin B-12 (CYANOCOBALAMIN) 1000 MCG tablet Take 1,000 mcg by mouth daily.         No current facility-administered medications on file prior to visit.   She is allergic to demerol; meperidine hcl; morphine; and nitroglycerin.  Review of Systems  Review of Systems  Constitutional: Negative for activity change, appetite change and fatigue.  HENT: Negative for hearing loss, congestion, tinnitus and ear discharge.   Eyes: Negative for visual disturbance (see optho q1y -- vision corrected to 20/20 with glasses).  Respiratory: Negative for cough, chest tightness and shortness of breath.   Cardiovascular: Negative for chest pain, palpitations and leg swelling.  Gastrointestinal: Negative for abdominal pain, diarrhea, constipation and abdominal distention.  Genitourinary: Negative for urgency, frequency, decreased urine volume and difficulty urinating.  Musculoskeletal: Negative for back pain, arthralgias and gait problem.  Skin: Negative for color change, pallor and rash.  Neurological: Negative for dizziness, light-headedness, numbness and headaches.  Hematological: Negative for adenopathy. Does not bruise/bleed easily.  Psychiatric/Behavioral: Negative for suicidal ideas, confusion, sleep disturbance, self-injury, dysphoric mood, decreased concentration and agitation.  Pt is able to read and write and  can do all ADLs No risk for falling No abuse/ violence in home      Objective:     Vision by Snellen chart: right opth  Body mass index is 30.79 kg/(m^2). BP 100/72  Pulse 89  Temp(Src) 98 F (36.7 C) (Oral)  Ht 5\' 5"  (1.651 m)  Wt 185 lb (83.915 kg)  BMI 30.79 kg/m2  SpO2 93%  BP 100/72  Pulse 89  Temp(Src) 98 F (36.7 C) (Oral)  Ht 5\' 5"  (1.651 m)  Wt 185 lb (83.915 kg)  BMI 30.79 kg/m2  SpO2 93% General appearance: alert, cooperative, appears stated age and no distress Head: Normocephalic, without obvious abnormality, atraumatic Eyes: conjunctivae/corneas clear. PERRL, EOM's intact. Fundi benign. Ears: normal TM's and external ear canals both ears Nose: Nares normal. Septum midline. Mucosa normal. No drainage or sinus tenderness. Throat: lips, mucosa, and tongue normal; teeth and gums normal Neck: no adenopathy, no carotid bruit, no JVD, supple, symmetrical, trachea midline and thyroid not enlarged, symmetric, no tenderness/mass/nodules Back: symmetric, no curvature. ROM normal. No CVA tenderness. Lungs: clear to auscultation bilaterally Breasts: normal appearance, no masses or tenderness Heart: regular rate and rhythm, S1, S2 normal, no murmur, click, rub or gallop Abdomen: soft, non-tender; bowel sounds normal; no masses,  no organomegaly Pelvic: not indicated; status post hysterectomy, negative ROS Extremities: extremities normal,  atraumatic, no cyanosis or edema Pulses: 2+ and symmetric Skin: Skin color, texture, turgor normal. No rashes or lesions Lymph nodes: Cervical, supraclavicular, and axillary nodes normal. Neurologic: Alert and oriented X 3, normal strength and tone. Normal symmetric reflexes. Normal coordination and gait Psych-- no anxiety, no depression      Assessment:     CPE     Plan:     During the course of the visit the patient was educated and counseled about appropriate screening and preventive services including:    Pneumococcal  vaccine   Influenza vaccine  Screening electrocardiogram  Screening mammography  Bone densitometry screening  Colorectal cancer screening  Advanced directives: has an advanced directive - a copy HAS NOT been provided.  Diet review for nutrition referral? Yes ____  Not Indicated _x___   Patient Instructions (the written plan) was given to the patient.  Medicare Attestation I have personally reviewed: The patient's medical and social history Their use of alcohol, tobacco or illicit drugs Their current medications and supplements The patient's functional ability including ADLs,fall risks, home safety risks, cognitive, and hearing and visual impairment Diet and physical activities Evidence for depression or mood disorders  The patient's weight, height, BMI, and visual acuity have been recorded in the chart.  I have made referrals, counseling, and provided education to the patient based on review of the above and I have provided the patient with a written personalized care plan for preventive services.     [name], DO   07/09/2012

## 2012-07-09 NOTE — Assessment & Plan Note (Signed)
Stable con't meds 

## 2012-07-09 NOTE — Patient Instructions (Signed)
Preventive Care for Adults, Female A healthy lifestyle and preventive care can promote health and wellness. Preventive health guidelines for women include the following key practices.  A routine yearly physical is a good way to check with your caregiver about your health and preventive screening. It is a chance to share any concerns and updates on your health, and to receive a thorough exam.  Visit your dentist for a routine exam and preventive care every 6 months. Brush your teeth twice a day and floss once a day. Good oral hygiene prevents tooth decay and gum disease.  The frequency of eye exams is based on your age, health, family medical history, use of contact lenses, and other factors. Follow your caregiver's recommendations for frequency of eye exams.  Eat a healthy diet. Foods like vegetables, fruits, whole grains, low-fat dairy products, and lean protein foods contain the nutrients you need without too many calories. Decrease your intake of foods high in solid fats, added sugars, and salt. Eat the right amount of calories for you.Get information about a proper diet from your caregiver, if necessary.  Regular physical exercise is one of the most important things you can do for your health. Most adults should get at least 150 minutes of moderate-intensity exercise (any activity that increases your heart rate and causes you to sweat) each week. In addition, most adults need muscle-strengthening exercises on 2 or more days a week.  Maintain a healthy weight. The body mass index (BMI) is a screening tool to identify possible weight problems. It provides an estimate of body fat based on height and weight. Your caregiver can help determine your BMI, and can help you achieve or maintain a healthy weight.For adults 20 years and older:  A BMI below 18.5 is considered underweight.  A BMI of 18.5 to 24.9 is normal.  A BMI of 25 to 29.9 is considered overweight.  A BMI of 30 and above is  considered obese.  Maintain normal blood lipids and cholesterol levels by exercising and minimizing your intake of saturated fat. Eat a balanced diet with plenty of fruit and vegetables. Blood tests for lipids and cholesterol should begin at age 20 and be repeated every 5 years. If your lipid or cholesterol levels are high, you are over 50, or you are at high risk for heart disease, you may need your cholesterol levels checked more frequently.Ongoing high lipid and cholesterol levels should be treated with medicines if diet and exercise are not effective.  If you smoke, find out from your caregiver how to quit. If you do not use tobacco, do not start.  If you are pregnant, do not drink alcohol. If you are breastfeeding, be very cautious about drinking alcohol. If you are not pregnant and choose to drink alcohol, do not exceed 1 drink per day. One drink is considered to be 12 ounces (355 mL) of beer, 5 ounces (148 mL) of wine, or 1.5 ounces (44 mL) of liquor.  Avoid use of street drugs. Do not share needles with anyone. Ask for help if you need support or instructions about stopping the use of drugs.  High blood pressure causes heart disease and increases the risk of stroke. Your blood pressure should be checked at least every 1 to 2 years. Ongoing high blood pressure should be treated with medicines if weight loss and exercise are not effective.  If you are 55 to 77 years old, ask your caregiver if you should take aspirin to prevent strokes.  Diabetes   screening involves taking a blood sample to check your fasting blood sugar level. This should be done once every 3 years, after age 45, if you are within normal weight and without risk factors for diabetes. Testing should be considered at a younger age or be carried out more frequently if you are overweight and have at least 1 risk factor for diabetes.  Breast cancer screening is essential preventive care for women. You should practice "breast  self-awareness." This means understanding the normal appearance and feel of your breasts and may include breast self-examination. Any changes detected, no matter how small, should be reported to a caregiver. Women in their 20s and 30s should have a clinical breast exam (CBE) by a caregiver as part of a regular health exam every 1 to 3 years. After age 40, women should have a CBE every year. Starting at age 40, women should consider having a mammography (breast X-ray test) every year. Women who have a family history of breast cancer should talk to their caregiver about genetic screening. Women at a high risk of breast cancer should talk to their caregivers about having magnetic resonance imaging (MRI) and a mammography every year.  The Pap test is a screening test for cervical cancer. A Pap test can show cell changes on the cervix that might become cervical cancer if left untreated. A Pap test is a procedure in which cells are obtained and examined from the lower end of the uterus (cervix).  Women should have a Pap test starting at age 21.  Between ages 21 and 29, Pap tests should be repeated every 2 years.  Beginning at age 30, you should have a Pap test every 3 years as long as the past 3 Pap tests have been normal.  Some women have medical problems that increase the chance of getting cervical cancer. Talk to your caregiver about these problems. It is especially important to talk to your caregiver if a new problem develops soon after your last Pap test. In these cases, your caregiver may recommend more frequent screening and Pap tests.  The above recommendations are the same for women who have or have not gotten the vaccine for human papillomavirus (HPV).  If you had a hysterectomy for a problem that was not cancer or a condition that could lead to cancer, then you no longer need Pap tests. Even if you no longer need a Pap test, a regular exam is a good idea to make sure no other problems are  starting.  If you are between ages 65 and 70, and you have had normal Pap tests going back 10 years, you no longer need Pap tests. Even if you no longer need a Pap test, a regular exam is a good idea to make sure no other problems are starting.  If you have had past treatment for cervical cancer or a condition that could lead to cancer, you need Pap tests and screening for cancer for at least 20 years after your treatment.  If Pap tests have been discontinued, risk factors (such as a new sexual partner) need to be reassessed to determine if screening should be resumed.  The HPV test is an additional test that may be used for cervical cancer screening. The HPV test looks for the virus that can cause the cell changes on the cervix. The cells collected during the Pap test can be tested for HPV. The HPV test could be used to screen women aged 30 years and older, and should   be used in women of any age who have unclear Pap test results. After the age of 30, women should have HPV testing at the same frequency as a Pap test.  Colorectal cancer can be detected and often prevented. Most routine colorectal cancer screening begins at the age of 50 and continues through age 75. However, your caregiver may recommend screening at an earlier age if you have risk factors for colon cancer. On a yearly basis, your caregiver may provide home test kits to check for hidden blood in the stool. Use of a small camera at the end of a tube, to directly examine the colon (sigmoidoscopy or colonoscopy), can detect the earliest forms of colorectal cancer. Talk to your caregiver about this at age 50, when routine screening begins. Direct examination of the colon should be repeated every 5 to 10 years through age 75, unless early forms of pre-cancerous polyps or small growths are found.  Hepatitis C blood testing is recommended for all people born from 1945 through 1965 and any individual with known risks for hepatitis C.  Practice  safe sex. Use condoms and avoid high-risk sexual practices to reduce the spread of sexually transmitted infections (STIs). STIs include gonorrhea, chlamydia, syphilis, trichomonas, herpes, HPV, and human immunodeficiency virus (HIV). Herpes, HIV, and HPV are viral illnesses that have no cure. They can result in disability, cancer, and death. Sexually active women aged 25 and younger should be checked for chlamydia. Older women with new or multiple partners should also be tested for chlamydia. Testing for other STIs is recommended if you are sexually active and at increased risk.  Osteoporosis is a disease in which the bones lose minerals and strength with aging. This can result in serious bone fractures. The risk of osteoporosis can be identified using a bone density scan. Women ages 65 and over and women at risk for fractures or osteoporosis should discuss screening with their caregivers. Ask your caregiver whether you should take a calcium supplement or vitamin D to reduce the rate of osteoporosis.  Menopause can be associated with physical symptoms and risks. Hormone replacement therapy is available to decrease symptoms and risks. You should talk to your caregiver about whether hormone replacement therapy is right for you.  Use sunscreen with sun protection factor (SPF) of 30 or more. Apply sunscreen liberally and repeatedly throughout the day. You should seek shade when your shadow is shorter than you. Protect yourself by wearing long sleeves, pants, a wide-brimmed hat, and sunglasses year round, whenever you are outdoors.  Once a month, do a whole body skin exam, using a mirror to look at the skin on your back. Notify your caregiver of new moles, moles that have irregular borders, moles that are larger than a pencil eraser, or moles that have changed in shape or color.  Stay current with required immunizations.  Influenza. You need a dose every fall (or winter). The composition of the flu vaccine  changes each year, so being vaccinated once is not enough.  Pneumococcal polysaccharide. You need 1 to 2 doses if you smoke cigarettes or if you have certain chronic medical conditions. You need 1 dose at age 65 (or older) if you have never been vaccinated.  Tetanus, diphtheria, pertussis (Tdap, Td). Get 1 dose of Tdap vaccine if you are younger than age 65, are over 65 and have contact with an infant, are a healthcare worker, are pregnant, or simply want to be protected from whooping cough. After that, you need a Td   booster dose every 10 years. Consult your caregiver if you have not had at least 3 tetanus and diphtheria-containing shots sometime in your life or have a deep or dirty wound.  HPV. You need this vaccine if you are a woman age 26 or younger. The vaccine is given in 3 doses over 6 months.  Measles, mumps, rubella (MMR). You need at least 1 dose of MMR if you were born in 1957 or later. You may also need a second dose.  Meningococcal. If you are age 19 to 21 and a first-year college student living in a residence hall, or have one of several medical conditions, you need to get vaccinated against meningococcal disease. You may also need additional booster doses.  Zoster (shingles). If you are age 60 or older, you should get this vaccine.  Varicella (chickenpox). If you have never had chickenpox or you were vaccinated but received only 1 dose, talk to your caregiver to find out if you need this vaccine.  Hepatitis A. You need this vaccine if you have a specific risk factor for hepatitis A virus infection or you simply wish to be protected from this disease. The vaccine is usually given as 2 doses, 6 to 18 months apart.  Hepatitis B. You need this vaccine if you have a specific risk factor for hepatitis B virus infection or you simply wish to be protected from this disease. The vaccine is given in 3 doses, usually over 6 months. Preventive Services / Frequency Ages 19 to 39  Blood  pressure check.** / Every 1 to 2 years.  Lipid and cholesterol check.** / Every 5 years beginning at age 20.  Clinical breast exam.** / Every 3 years for women in their 20s and 30s.  Pap test.** / Every 2 years from ages 21 through 29. Every 3 years starting at age 30 through age 65 or 70 with a history of 3 consecutive normal Pap tests.  HPV screening.** / Every 3 years from ages 30 through ages 65 to 70 with a history of 3 consecutive normal Pap tests.  Hepatitis C blood test.** / For any individual with known risks for hepatitis C.  Skin self-exam. / Monthly.  Influenza immunization.** / Every year.  Pneumococcal polysaccharide immunization.** / 1 to 2 doses if you smoke cigarettes or if you have certain chronic medical conditions.  Tetanus, diphtheria, pertussis (Tdap, Td) immunization. / A one-time dose of Tdap vaccine. After that, you need a Td booster dose every 10 years.  HPV immunization. / 3 doses over 6 months, if you are 26 and younger.  Measles, mumps, rubella (MMR) immunization. / You need at least 1 dose of MMR if you were born in 1957 or later. You may also need a second dose.  Meningococcal immunization. / 1 dose if you are age 19 to 21 and a first-year college student living in a residence hall, or have one of several medical conditions, you need to get vaccinated against meningococcal disease. You may also need additional booster doses.  Varicella immunization.** / Consult your caregiver.  Hepatitis A immunization.** / Consult your caregiver. 2 doses, 6 to 18 months apart.  Hepatitis B immunization.** / Consult your caregiver. 3 doses usually over 6 months. Ages 40 to 64  Blood pressure check.** / Every 1 to 2 years.  Lipid and cholesterol check.** / Every 5 years beginning at age 20.  Clinical breast exam.** / Every year after age 40.  Mammogram.** / Every year beginning at age 40   and continuing for as long as you are in good health. Consult with your  caregiver.  Pap test.** / Every 3 years starting at age 30 through age 65 or 70 with a history of 3 consecutive normal Pap tests.  HPV screening.** / Every 3 years from ages 30 through ages 65 to 70 with a history of 3 consecutive normal Pap tests.  Fecal occult blood test (FOBT) of stool. / Every year beginning at age 50 and continuing until age 75. You may not need to do this test if you get a colonoscopy every 10 years.  Flexible sigmoidoscopy or colonoscopy.** / Every 5 years for a flexible sigmoidoscopy or every 10 years for a colonoscopy beginning at age 50 and continuing until age 75.  Hepatitis C blood test.** / For all people born from 1945 through 1965 and any individual with known risks for hepatitis C.  Skin self-exam. / Monthly.  Influenza immunization.** / Every year.  Pneumococcal polysaccharide immunization.** / 1 to 2 doses if you smoke cigarettes or if you have certain chronic medical conditions.  Tetanus, diphtheria, pertussis (Tdap, Td) immunization.** / A one-time dose of Tdap vaccine. After that, you need a Td booster dose every 10 years.  Measles, mumps, rubella (MMR) immunization. / You need at least 1 dose of MMR if you were born in 1957 or later. You may also need a second dose.  Varicella immunization.** / Consult your caregiver.  Meningococcal immunization.** / Consult your caregiver.  Hepatitis A immunization.** / Consult your caregiver. 2 doses, 6 to 18 months apart.  Hepatitis B immunization.** / Consult your caregiver. 3 doses, usually over 6 months. Ages 65 and over  Blood pressure check.** / Every 1 to 2 years.  Lipid and cholesterol check.** / Every 5 years beginning at age 20.  Clinical breast exam.** / Every year after age 40.  Mammogram.** / Every year beginning at age 40 and continuing for as long as you are in good health. Consult with your caregiver.  Pap test.** / Every 3 years starting at age 30 through age 65 or 70 with a 3  consecutive normal Pap tests. Testing can be stopped between 65 and 70 with 3 consecutive normal Pap tests and no abnormal Pap or HPV tests in the past 10 years.  HPV screening.** / Every 3 years from ages 30 through ages 65 or 70 with a history of 3 consecutive normal Pap tests. Testing can be stopped between 65 and 70 with 3 consecutive normal Pap tests and no abnormal Pap or HPV tests in the past 10 years.  Fecal occult blood test (FOBT) of stool. / Every year beginning at age 50 and continuing until age 75. You may not need to do this test if you get a colonoscopy every 10 years.  Flexible sigmoidoscopy or colonoscopy.** / Every 5 years for a flexible sigmoidoscopy or every 10 years for a colonoscopy beginning at age 50 and continuing until age 75.  Hepatitis C blood test.** / For all people born from 1945 through 1965 and any individual with known risks for hepatitis C.  Osteoporosis screening.** / A one-time screening for women ages 65 and over and women at risk for fractures or osteoporosis.  Skin self-exam. / Monthly.  Influenza immunization.** / Every year.  Pneumococcal polysaccharide immunization.** / 1 dose at age 65 (or older) if you have never been vaccinated.  Tetanus, diphtheria, pertussis (Tdap, Td) immunization. / A one-time dose of Tdap vaccine if you are over   65 and have contact with an infant, are a healthcare worker, or simply want to be protected from whooping cough. After that, you need a Td booster dose every 10 years.  Varicella immunization.** / Consult your caregiver.  Meningococcal immunization.** / Consult your caregiver.  Hepatitis A immunization.** / Consult your caregiver. 2 doses, 6 to 18 months apart.  Hepatitis B immunization.** / Check with your caregiver. 3 doses, usually over 6 months. ** Family history and personal history of risk and conditions may change your caregiver's recommendations. Document Released: 05/31/2001 Document Revised: 06/27/2011  Document Reviewed: 08/30/2010 [employer] Patient Information 2013 [employer].  

## 2012-07-09 NOTE — Assessment & Plan Note (Signed)
bmd utd

## 2012-07-10 ENCOUNTER — Other Ambulatory Visit: Payer: Self-pay | Admitting: Family Medicine

## 2012-07-11 ENCOUNTER — Telehealth: Payer: Self-pay

## 2012-07-11 NOTE — Telephone Encounter (Signed)
Msg from the patient stating that she is currently taking Micardis 40-12.5 and she is not sure why Dr.Lowne sent her in a fluid pill. She wanted to make Dr.LOwne aware that the fluid pill was left at the pharmacy.     KP/CMA

## 2012-07-12 MED ORDER — TELMISARTAN-HCTZ 40-12.5 MG PO TABS: 1.0000 | ORAL_TABLET | Freq: Every day | ORAL | Status: DC

## 2012-07-12 NOTE — Telephone Encounter (Signed)
Discuss with patient, Rx sent. 

## 2012-07-12 NOTE — Telephone Encounter (Signed)
We must of received a request from pharmacy--- ok to d/c

## 2012-07-12 NOTE — Telephone Encounter (Signed)
Per Dr Laury Axon ok to leave hydrochlorothiazide at pharmacy.[name] Spoke to Pt who states that she restarted BP med MICARDIS HCT 40-12.5 MG again due to BP being elevated. Pt notes that prior to recent restart she had been off BP med for 2 years and it included the HTCZ. Pt refill old Rx but only has about 6 days left of the Rx and would like to get a new Rx for this med sent in to pharmacy .Please advise

## 2012-07-12 NOTE — Telephone Encounter (Signed)
Ok to refill for 6 months 

## 2012-07-13 ENCOUNTER — Telehealth: Payer: Self-pay | Admitting: *Deleted

## 2012-07-13 LAB — URINALYSIS, MICROSCOPIC ONLY
Casts: NONE SEEN
Crystals: NONE SEEN
Squamous Epithelial / LPF: NONE SEEN

## 2012-07-13 LAB — CULTURE, URINE COMPREHENSIVE: Colony Count: >=100000

## 2012-07-13 MED ORDER — CIPROFLOXACIN HCL 500 MG PO TABS: 500.0000 mg | ORAL_TABLET | Freq: Two times a day (BID) | ORAL | Status: DC

## 2012-07-13 NOTE — Telephone Encounter (Signed)
Spoke with the pt and informed her of recent UC results and note.  Pt understood and agreed.  New rx for Cipro 500mg  sent to the pharmacy by e-script.//AB/CMA

## 2012-07-13 NOTE — Telephone Encounter (Signed)
Message copied by Verdie Shire on Fri Jul 13, 2012  6:03 PM ------      Message from: Lelon Perla      Created: Fri Jul 13, 2012  5:07 PM       cipro 500 mg 1 po bid for 5 days ------

## 2012-07-17 ENCOUNTER — Telehealth: Payer: Self-pay | Admitting: Family Medicine

## 2012-07-17 NOTE — Telephone Encounter (Signed)
Decrease to micardis 20 mg  --------- we can not recommend something like hawthorne that is not FDA approved.  We will lower dose of med and she should come in to be seen ----she can start by breaking what she has in half but we should really take diuretic out

## 2012-07-17 NOTE — Telephone Encounter (Signed)
Patient's daughter called stating that she does not think the patient needs blood pressure medication. She states the patient has been dizzy since she started taking it again at her recent visit. Daughter states she heard that Hawthorne pills help with blood pressure without the side effects. Please advise.

## 2012-07-17 NOTE — Telephone Encounter (Signed)
Spoke with patient and her BP at 5 pm was 108/68, she agreed to break the medication on half and will continue to monitor her BP and if it continues to be low she will call, I also made her daughter aware and she voiced understanding.      KP

## 2012-08-16 ENCOUNTER — Ambulatory Visit (INDEPENDENT_AMBULATORY_CARE_PROVIDER_SITE_OTHER): Payer: Medicare Other | Admitting: Surgery

## 2012-08-16 ENCOUNTER — Encounter (INDEPENDENT_AMBULATORY_CARE_PROVIDER_SITE_OTHER): Payer: Self-pay | Admitting: Surgery

## 2012-08-16 VITALS — BP 124/74 | HR 82 | Resp 18 | Ht 65.0 in | Wt 185.0 lb

## 2012-08-16 DIAGNOSIS — K440 Diaphragmatic hernia with obstruction, without gangrene: Secondary | ICD-10-CM

## 2012-08-16 NOTE — Progress Notes (Signed)
Chief Complaint:  Giant type III mixed hiatal hernia with entire stomach in chest  History of Present Illness:  [name] is an 79 y.o. female Patient referred by Dr. Brody with a giant hiatal hernia with her stomach in her chest. Her son is married to Mary Sutton with him I operated with at [facility] many years ago. She comes today with her daughter and her son and we discussed her long-standing problems with reflux, cough, and now dysphasia. I showed them her upper GI series and then explained laparoscopic repair of her hiatal hernia and reduction of her stomach in some detail. I explained the risks and benefits and they're aware of these. There were this is a potential to greatly improve the quality of her life.  We'll proceed with laparoscopic takedown of large type III mixed hiatal hernia with closure of the diaphragm and possible Nissen fundoplication.  Past Medical History  Diagnosis Date  . Hypertension   . Osteoporosis   . COPD (chronic obstructive pulmonary disease)   . Hyperlipidemia   . Anxiety   . Depression   . Breast cancer 13 years ago    s/p tamoxifen   . Peptic ulcer disease   . Hiatal hernia   . Ulcerative colitis   . GERD (gastroesophageal reflux disease)   . Hiatal hernia   . Osteoarthritis   . Diverticulosis   . Esophageal stricture   . Internal hemorrhoids   . Gastric outlet obstruction   . Rheumatic heart disease   . Glaucoma(365)     Past Surgical History  Procedure Laterality Date  . Breast surgery      right  . Cataract extraction  08/2006 and 10/2006  . Total knee arthroplasty      left  . Hemorrhoid surgery    . Tonsillectomy    . Abdominal hysterectomy      Current Outpatient Prescriptions  Medication Sig Dispense Refill  . aspirin 81 MG tablet Take 81 mg by mouth daily.        . Aspirin-Salicylamide-Caffeine (BC HEADACHE) 325-95-16 MG TABS Take 1 tablet by mouth as needed.        . azelastine (ASTELIN) 137 MCG/SPRAY nasal spray  Place 2 sprays into the nose 2 (two) times daily. Use in each nostril as directed  30 mL  12  . ciprofloxacin (CIPRO) 500 MG tablet Take 1 tablet (500 mg total) by mouth 2 (two) times daily. X 5 days  10 tablet  0  . escitalopram (LEXAPRO) 20 MG tablet Take 1 tablet (20 mg total) by mouth daily.  30 tablet  5  . hydrochlorothiazide (HYDRODIURIL) 25 MG tablet Take 1 tablet (25 mg total) by mouth daily.  30 tablet  11  . Iron 66 MG TABS Take 65 mg by mouth daily.        . loratadine (CLARITIN) 10 MG tablet Take 1 tablet (10 mg total) by mouth daily.  30 tablet  11  . LORazepam (ATIVAN) 2 MG tablet TAKE 1/2 TABLET BY MOUTH EVERY 8 HOURS AS NEEDED FOR ANXIETY  60 tablet  0  . Multiple Vitamins-Minerals (WOMENS MULTI VITAMIN & MINERAL PO) Take 1,000 mg by mouth daily.        . pantoprazole (PROTONIX) 40 MG tablet Take 1 tablet (40 mg total) by mouth daily.  30 tablet  11  . PATADAY 0.2 % SOLN Place 1 drop into both eyes daily.      . ranitidine (ZANTAC) 300 MG tablet Take   1 tablet (300 mg total) by mouth at bedtime.  30 tablet  1  . RESTASIS 0.05 % ophthalmic emulsion       . rosuvastatin (CRESTOR) 20 MG tablet Take 1 tablet (20 mg total) by mouth daily.  30 tablet  5  . sucralfate (CARAFATE) 1 G tablet Take 1 g by mouth 2 (two) times daily as needed.       . telmisartan (MICARDIS) 20 MG tablet Take 20 mg by mouth daily.      . telmisartan-hydrochlorothiazide (MICARDIS HCT) 40-12.5 MG per tablet Take 1 tablet by mouth daily.  30 tablet  6  . vitamin B-12 (CYANOCOBALAMIN) 1000 MCG tablet Take 1,000 mcg by mouth daily.        . zoster vaccine live, PF, (ZOSTAVAX) 19400 UNT/0.65ML injection Inject 19,400 Units into the skin once.  1 vial  0   No current facility-administered medications for this visit.   Demerol; Meperidine hcl; Morphine; and Nitroglycerin Family History  Problem Relation Age of Onset  . Coronary artery disease Sister     x 2 in 70's  . Coronary artery disease Brother     x 2 in  70's  . Diabetes    . Colon cancer Father 67  . Hypertension    . Stroke Mother 54  . Crohn's disease Brother   . Breast cancer      Aunt  . Cirrhosis Brother    Social History:   reports that she has never smoked. She has never used smokeless tobacco. She reports that she does not drink alcohol or use illicit drugs.   REVIEW OF SYSTEMS - PERTINENT POSITIVES ONLY: No history of DVT. Looks younger than her stated age  Physical Exam:   Blood pressure 124/74, pulse 82, resp. rate 18, height 5' 5" (1.651 m), weight 185 lb (83.915 kg). Body mass index is 30.79 kg/(m^2).  Gen:  WDWN White female NAD  Neurological: Alert and oriented to person, place, and time. Motor and sensory function is grossly intact  Head: Normocephalic and atraumatic.  Eyes: Conjunctivae are normal. Pupils are equal, round, and reactive to light. No scleral icterus.  Neck: Normal range of motion. Neck supple. No tracheal deviation or thyromegaly present.  Cardiovascular:  SR without murmurs or gallops.  No carotid bruits  Prior right mastectomy with latissimus dorsi flap reconstruction done in [place] by Dr. Scott Tucker. Respiratory: Effort normal.  No respiratory distress. No chest wall tenderness. Breath sounds normal.  No wheezes, rales or rhonchi.  Abdomen:  Previous Pfannenstiel incision from hysterectomy GU: Musculoskeletal: Normal range of motion. Extremities are nontender. No cyanosis, edema or clubbing noted Lymphadenopathy: No cervical, preauricular, postauricular or axillary adenopathy is present Skin: Skin is warm and dry. No rash noted. No diaphoresis. No erythema. No pallor. Pscyh: Normal mood and affect. Behavior is normal. Judgment and thought content normal.   LABORATORY RESULTS: No results found for this or any previous visit (from the past 48 hour(s)).  RADIOLOGY RESULTS: No results found.  Problem List: Patient Active Problem List   Diagnosis Date Noted  . Urinary frequency  12/09/2011  . Carotid arterial disease 10/31/2011  . UTI 05/14/2010  . HYPERGLYCEMIA 05/14/2010  . OTH SPEC ALVEOL&PARIETOALVEOL PNEUMONOPATHIES 04/28/2010  . HEMORRHOIDS-INTERNAL 03/01/2010  . GASTRIC OUTLET OBSTRUCTION 02/10/2010  . RHEUMATIC HEART DISEASE 02/02/2010  . ESOPHAGEAL STRICTURE 02/02/2010  . GERD 02/02/2010  . DUODENAL ULCER 02/02/2010  . HIATAL HERNIA 02/02/2010  . DIVERTICULOSIS, COLON 02/02/2010  . OSTEOARTHRITIS 02/02/2010  .   ASYMPTOMATIC POSTMENOPAUSAL STATUS 06/09/2008  . DECREASED HEARING, BILATERAL 02/06/2008  . PEPTIC ULCER DISEASE 06/07/2007  . HYPERLIPIDEMIA 12/19/2006  . ANXIETY 12/19/2006  . Anxiety and depression 12/19/2006  . COPD 12/19/2006  . GLAUCOMA 09/04/2006  . HYPERTENSION 09/04/2006  . OSTEOPOROSIS 09/04/2006  . Personal History of Other Diseases of Circulatory System 09/04/2006  . TOTAL HYSTERECTOMY AND BILATERAL SALPINGOOPHERECTOMY, HX OF 09/04/2006  . TONSILLECTOMY AND ADENOIDECTOMY, HX OF 09/04/2006  . PREMATURE VENTRICULAR CONTRACTIONS 10/06/1994  . MASTECTOMY, RIGHT, HX OF 04/18/1993    Assessment & Plan: Large type III mixed hiatal hernia. Plan laparoscopic/open repair of diaphragmatic hernia and Nissen fundoplication    Matt B. Myracle Febres, MD, FACS  [facility] [phone] beeper [phone]  08/16/2012 12:23 PM     

## 2012-08-16 NOTE — Patient Instructions (Signed)
Thanks for your patience.  If you need further assistance after leaving the office, please call our office and speak with a [facility] nurse.  [phone].  If you want to leave a message for Dr. Landra Howze, please call his office phone at [phone]. 

## 2012-08-17 ENCOUNTER — Other Ambulatory Visit: Payer: Self-pay | Admitting: Family Medicine

## 2012-08-17 NOTE — Telephone Encounter (Signed)
Last seen 07/09/12 and filled 06/08/12 #60,. Please advise    KP

## 2012-08-20 ENCOUNTER — Encounter (HOSPITAL_COMMUNITY): Payer: Self-pay | Admitting: Pharmacy Technician

## 2012-08-20 NOTE — Progress Notes (Signed)
ordeds are needed in EPIC when possible please - pt coming for preop Tues 08/28/12 Thank you

## 2012-08-24 NOTE — Progress Notes (Signed)
Dr Martin-  NEED PRE OP ORDERS PLEASE     Thank you 

## 2012-08-28 ENCOUNTER — Encounter (HOSPITAL_COMMUNITY): Payer: Self-pay

## 2012-08-28 ENCOUNTER — Encounter (HOSPITAL_COMMUNITY)
Admission: RE | Admit: 2012-08-28 | Discharge: 2012-08-28 | Disposition: A | Discharge status: Home / Self Care | Payer: Medicare Other | Source: Ambulatory Visit | Attending: Surgery | Admitting: Surgery

## 2012-08-28 DIAGNOSIS — Z01812 Encounter for preprocedural laboratory examination: Secondary | ICD-10-CM | POA: Insufficient documentation

## 2012-08-28 DIAGNOSIS — K219 Gastro-esophageal reflux disease without esophagitis: Secondary | ICD-10-CM | POA: Insufficient documentation

## 2012-08-28 DIAGNOSIS — K449 Diaphragmatic hernia without obstruction or gangrene: Secondary | ICD-10-CM | POA: Insufficient documentation

## 2012-08-28 DIAGNOSIS — I10 Essential (primary) hypertension: Secondary | ICD-10-CM | POA: Insufficient documentation

## 2012-08-28 LAB — BASIC METABOLIC PANEL
BUN: 18 mg/dL (ref 6–23)
Calcium: 10 mg/dL (ref 8.4–10.5)
GFR calc Af Amer: >90 mL/min (ref >90)
GFR calc non Af Amer: 81 mL/min — ABNORMAL LOW (ref >90)
Potassium: 4.1 mEq/L (ref 3.5–5.1)
Sodium: 143 mEq/L (ref 135–145)

## 2012-08-28 LAB — CBC
HCT: 43.7 % (ref 36.0–46.0)
MCH: 32.1 pg (ref 26.0–34.0)
MCHC: 33 g/dL (ref 30.0–36.0)
RDW: 12.9 % (ref 11.5–15.5)

## 2012-08-28 LAB — SURGICAL PCR SCREEN
MRSA, PCR: NEGATIVE
Staphylococcus aureus: POSITIVE — AB

## 2012-08-28 NOTE — Patient Instructions (Signed)
20      Your procedure is scheduled on:  Monday 09/03/2012  Report to [facility] at 0815  AM.  Call this number if you have problems the morning of surgery: [phone]   Remember:             IF YOU USE CPAP,BRING MASK AND TUBING AM OF SURGERY!   Do not eat food or drink liquids AFTER MIDNIGHT!  Take these medicines the morning of surgery with A SIP OF WATER: Lexapro, use opthalmic solution as needed   Do not bring valuables to the hospital.  .  Leave suitcase in the car. After surgery it may be brought to your room.  For patients admitted to the hospital, checkout time is 11:00 AM the day of              Discharge.    DO NOT WEAR JEWELRY , MAKE-UP, LOTIONS,POWDERS,PERFUMES!             WOMEN -DO NOT SHAVE LEGS OR UNDERARMS 12 HRS. BEFORE SURGERY!               MEN MAY SHAVE AS USUAL!             CONTACTS,DENTURES OR BRIDGEWORK, FALSE EYELASHES MAY NOT BE WORN INTO SURGERY!                                           Patients discharged the day of surgery will not be allowed to drive home.  If going home the same day of surgery, must have someone stay with you  first 24 hrs.at home and arrange for someone to drive you home from the Hospital.                         YOUR DRIVER IS: does not know if daughter or one of sons will bring or take her home   Special Instructions:             Please read over the following fact sheets that you were given:             1. [facility] PREPARING FOR SURGERY SHEET              2.[facility] INFORMATION              3.[facility]                                        Telford Nab.Shadana Pry,RN,BSN     [phone]                FAILURE TO FOLLOW THESE INSTRUCTIONS MAY RESULT IN   CANCELLATION OF YOUR SURGERY!               Patient Signature:___________________________

## 2012-08-29 NOTE — Pre-Procedure Instructions (Signed)
08-29-12 1130 Pt. Notified of Positive Staph aureus PCR screen -will use Mupirocin as directed with. W.Dayne Dekay,RN

## 2012-08-31 NOTE — Progress Notes (Signed)
Test message to doctor for pre-op orders for patient.

## 2012-09-03 ENCOUNTER — Encounter (HOSPITAL_COMMUNITY)
Admission: RE | Disposition: A | Discharge status: Home / Self Care | Payer: Self-pay | Source: Ambulatory Visit | Attending: Surgery

## 2012-09-03 ENCOUNTER — Inpatient Hospital Stay (HOSPITAL_COMMUNITY): Payer: Medicare Other | Admitting: Anesthesiology

## 2012-09-03 ENCOUNTER — Encounter (HOSPITAL_COMMUNITY): Payer: Self-pay | Admitting: Anesthesiology

## 2012-09-03 ENCOUNTER — Inpatient Hospital Stay (HOSPITAL_COMMUNITY)
Admission: RE | Admit: 2012-09-03 | Discharge: 2012-09-06 | DRG: 328 | Disposition: A | Discharge status: Home / Self Care | Payer: Medicare Other | Source: Ambulatory Visit | Attending: Surgery | Admitting: Surgery

## 2012-09-03 ENCOUNTER — Encounter (HOSPITAL_COMMUNITY): Payer: Self-pay | Admitting: *Deleted

## 2012-09-03 ENCOUNTER — Other Ambulatory Visit (INDEPENDENT_AMBULATORY_CARE_PROVIDER_SITE_OTHER): Payer: Self-pay | Admitting: Surgery

## 2012-09-03 DIAGNOSIS — K449 Diaphragmatic hernia without obstruction or gangrene: Principal | ICD-10-CM

## 2012-09-03 DIAGNOSIS — F411 Generalized anxiety disorder: Secondary | ICD-10-CM | POA: Diagnosis present

## 2012-09-03 DIAGNOSIS — J449 Chronic obstructive pulmonary disease, unspecified: Secondary | ICD-10-CM | POA: Diagnosis present

## 2012-09-03 DIAGNOSIS — F3289 Other specified depressive episodes: Secondary | ICD-10-CM | POA: Diagnosis present

## 2012-09-03 DIAGNOSIS — I10 Essential (primary) hypertension: Secondary | ICD-10-CM | POA: Diagnosis present

## 2012-09-03 DIAGNOSIS — E785 Hyperlipidemia, unspecified: Secondary | ICD-10-CM | POA: Diagnosis present

## 2012-09-03 DIAGNOSIS — J4489 Other specified chronic obstructive pulmonary disease: Secondary | ICD-10-CM | POA: Diagnosis present

## 2012-09-03 DIAGNOSIS — Z01812 Encounter for preprocedural laboratory examination: Secondary | ICD-10-CM

## 2012-09-03 DIAGNOSIS — F329 Major depressive disorder, single episode, unspecified: Secondary | ICD-10-CM | POA: Diagnosis present

## 2012-09-03 DIAGNOSIS — M810 Age-related osteoporosis without current pathological fracture: Secondary | ICD-10-CM | POA: Diagnosis present

## 2012-09-03 HISTORY — PX: HIATAL HERNIA REPAIR: SHX195

## 2012-09-03 HISTORY — PX: LAPAROSCOPIC NISSEN FUNDOPLICATION: SHX1932

## 2012-09-03 LAB — CREATININE, SERUM: Creatinine, Ser: 0.72 mg/dL (ref 0.50–1.10)

## 2012-09-03 LAB — CBC
MCH: 32.4 pg (ref 26.0–34.0)
Platelets: 190 10*3/uL (ref 150–400)
RBC: 4.26 MIL/uL (ref 3.87–5.11)

## 2012-09-03 SURGERY — FUNDOPLICATION, NISSEN, LAPAROSCOPIC
Anesthesia: General | Site: Abdomen | Wound class: Clean

## 2012-09-03 MED ORDER — ONDANSETRON HCL 4 MG/2ML IJ SOLN: 4.0000 mg | Freq: Four times a day (QID) | INTRAMUSCULAR | Status: DC | PRN

## 2012-09-03 MED ORDER — MORPHINE SULFATE 2 MG/ML IJ SOLN: 1.0000 mg | INTRAMUSCULAR | Status: DC | PRN

## 2012-09-03 MED ORDER — CEFAZOLIN SODIUM-DEXTROSE 2-3 GM-% IV SOLR
2.0000 g | INTRAVENOUS | Status: AC
  Administered 2012-09-03: 2 g via INTRAVENOUS

## 2012-09-03 MED ORDER — HYDROMORPHONE HCL PF 1 MG/ML IJ SOLN: 0.2500 mg | INTRAMUSCULAR | Status: DC | PRN

## 2012-09-03 MED ORDER — ONDANSETRON HCL 4 MG PO TABS: 4.0000 mg | ORAL_TABLET | Freq: Four times a day (QID) | ORAL | Status: DC | PRN

## 2012-09-03 MED ORDER — SUCCINYLCHOLINE CHLORIDE 20 MG/ML IJ SOLN
INTRAMUSCULAR | Status: DC | PRN
  Administered 2012-09-03: 100 mg via INTRAVENOUS

## 2012-09-03 MED ORDER — AZELASTINE HCL 0.1 % NA SOLN
1.0000 | Freq: Two times a day (BID) | NASAL | Status: DC | PRN
  Filled 2012-09-03: qty 30

## 2012-09-03 MED ORDER — CYCLOSPORINE 0.05 % OP EMUL
1.0000 [drp] | Freq: Every day | OPHTHALMIC | Status: DC
  Administered 2012-09-03 – 2012-09-05 (×3): 1 [drp] via OPHTHALMIC
  Filled 2012-09-03 (×5): qty 1

## 2012-09-03 MED ORDER — ESMOLOL HCL 10 MG/ML IV SOLN
INTRAVENOUS | Status: DC | PRN
  Administered 2012-09-03: 30 mg via INTRAVENOUS
  Administered 2012-09-03: 20 mg via INTRAVENOUS

## 2012-09-03 MED ORDER — LIDOCAINE HCL (PF) 2 % IJ SOLN
INTRAMUSCULAR | Status: DC | PRN
  Administered 2012-09-03: 100 mg

## 2012-09-03 MED ORDER — BUPIVACAINE LIPOSOME 1.3 % IJ SUSP
20.0000 mL | Freq: Once | INTRAMUSCULAR | Status: DC
  Filled 2012-09-03: qty 20

## 2012-09-03 MED ORDER — LACTATED RINGERS IV SOLN
INTRAVENOUS | Status: DC | PRN
  Administered 2012-09-03: 1000 mL

## 2012-09-03 MED ORDER — CISATRACURIUM BESYLATE (PF) 10 MG/5ML IV SOLN
INTRAVENOUS | Status: DC | PRN
  Administered 2012-09-03: 8 mg via INTRAVENOUS
  Administered 2012-09-03 (×2): 2 mg via INTRAVENOUS
  Administered 2012-09-03 (×2): 4 mg via INTRAVENOUS

## 2012-09-03 MED ORDER — SUFENTANIL CITRATE 50 MCG/ML IV SOLN
INTRAVENOUS | Status: DC | PRN
  Administered 2012-09-03 (×6): 10 ug via INTRAVENOUS

## 2012-09-03 MED ORDER — PROMETHAZINE HCL 25 MG/ML IJ SOLN: 6.2500 mg | INTRAMUSCULAR | Status: DC | PRN

## 2012-09-03 MED ORDER — LACTATED RINGERS IV SOLN
INTRAVENOUS | Status: DC
  Administered 2012-09-03: 13:00:00 via INTRAVENOUS
  Administered 2012-09-03: 1000 mL via INTRAVENOUS

## 2012-09-03 MED ORDER — BUPIVACAINE LIPOSOME 1.3 % IJ SUSP
INTRAMUSCULAR | Status: DC | PRN
  Administered 2012-09-03: 20 mL

## 2012-09-03 MED ORDER — FAMOTIDINE IN NACL 20-0.9 MG/50ML-% IV SOLN
20.0000 mg | Freq: Once | INTRAVENOUS | Status: AC
  Administered 2012-09-03: 20 mg via INTRAVENOUS

## 2012-09-03 MED ORDER — ACETAMINOPHEN 10 MG/ML IV SOLN
INTRAVENOUS | Status: DC | PRN
  Administered 2012-09-03: 1000 mg via INTRAVENOUS

## 2012-09-03 MED ORDER — KCL IN DEXTROSE-NACL 20-5-0.45 MEQ/L-%-% IV SOLN
INTRAVENOUS | Status: DC
  Administered 2012-09-03 – 2012-09-04 (×2): via INTRAVENOUS
  Administered 2012-09-04: 1000 mL via INTRAVENOUS
  Administered 2012-09-06: 01:00:00 via INTRAVENOUS
  Filled 2012-09-03 (×7): qty 1000

## 2012-09-03 MED ORDER — LACTATED RINGERS IV SOLN: INTRAVENOUS | Status: DC | PRN

## 2012-09-03 MED ORDER — NEOSTIGMINE METHYLSULFATE 1 MG/ML IJ SOLN
INTRAMUSCULAR | Status: DC | PRN
  Administered 2012-09-03: 5 mg via INTRAVENOUS

## 2012-09-03 MED ORDER — FAMOTIDINE IN NACL 20-0.9 MG/50ML-% IV SOLN
20.0000 mg | Freq: Once | INTRAVENOUS | Status: DC
  Filled 2012-09-03: qty 50

## 2012-09-03 MED ORDER — DEXTROSE 5 % IV SOLN: 3.0000 g | INTRAVENOUS | Status: DC

## 2012-09-03 MED ORDER — OLOPATADINE HCL 0.1 % OP SOLN
1.0000 [drp] | Freq: Every morning | OPHTHALMIC | Status: DC
  Administered 2012-09-04 – 2012-09-06 (×3): 1 [drp] via OPHTHALMIC
  Filled 2012-09-03 (×2): qty 5

## 2012-09-03 MED ORDER — PROPOFOL 10 MG/ML IV BOLUS
INTRAVENOUS | Status: DC | PRN
  Administered 2012-09-03: 30 mg via INTRAVENOUS
  Administered 2012-09-03: 80 mg via INTRAVENOUS
  Administered 2012-09-03: 40 mg via INTRAVENOUS

## 2012-09-03 MED ORDER — CHLORHEXIDINE GLUCONATE 4 % EX LIQD: 1.0000 "application " | Freq: Once | CUTANEOUS | Status: DC

## 2012-09-03 MED ORDER — ONDANSETRON HCL 4 MG/2ML IJ SOLN
INTRAMUSCULAR | Status: DC | PRN
  Administered 2012-09-03: 4 mg via INTRAVENOUS

## 2012-09-03 MED ORDER — HEPARIN SODIUM (PORCINE) 5000 UNIT/ML IJ SOLN
5000.0000 [IU] | Freq: Three times a day (TID) | INTRAMUSCULAR | Status: DC
  Filled 2012-09-03 (×2): qty 1

## 2012-09-03 MED ORDER — HYDROMORPHONE HCL PF 1 MG/ML IJ SOLN
0.5000 mg | INTRAMUSCULAR | Status: DC | PRN
  Administered 2012-09-03 (×2): 0.5 mg via INTRAVENOUS
  Administered 2012-09-04 – 2012-09-05 (×6): 1 mg via INTRAVENOUS
  Administered 2012-09-06: 0.5 mg via INTRAVENOUS
  Filled 2012-09-03 (×9): qty 1

## 2012-09-03 MED ORDER — HEPARIN SODIUM (PORCINE) 5000 UNIT/ML IJ SOLN
5000.0000 [IU] | Freq: Once | INTRAMUSCULAR | Status: AC
  Administered 2012-09-03: 5000 [IU] via SUBCUTANEOUS
  Filled 2012-09-03: qty 1

## 2012-09-03 MED ORDER — EPHEDRINE SULFATE 50 MG/ML IJ SOLN
INTRAMUSCULAR | Status: DC | PRN
  Administered 2012-09-03: 5 mg via INTRAVENOUS

## 2012-09-03 MED ORDER — DEXAMETHASONE SODIUM PHOSPHATE 10 MG/ML IJ SOLN
INTRAMUSCULAR | Status: DC | PRN
  Administered 2012-09-03: 4 mg via INTRAVENOUS

## 2012-09-03 MED ORDER — HEPARIN SODIUM (PORCINE) 5000 UNIT/ML IJ SOLN
5000.0000 [IU] | Freq: Three times a day (TID) | INTRAMUSCULAR | Status: DC
  Administered 2012-09-03 – 2012-09-06 (×8): 5000 [IU] via SUBCUTANEOUS
  Filled 2012-09-03 (×12): qty 1

## 2012-09-03 MED ORDER — GLYCOPYRROLATE 0.2 MG/ML IJ SOLN
INTRAMUSCULAR | Status: DC | PRN
  Administered 2012-09-03: 0.6 mg via INTRAVENOUS

## 2012-09-03 SURGICAL SUPPLY — 55 items
APPLIER CLIP ROT 10 11.4 M/L (STAPLE)
BENZOIN TINCTURE PRP APPL 2/3 (GAUZE/BANDAGES/DRESSINGS) ×3 IMPLANT
CABLE HIGH FREQUENCY MONO STRZ (ELECTRODE) IMPLANT
CANISTER SUCTION 2500CC (MISCELLANEOUS) IMPLANT
CLAMP ENDO BABCK 10MM (STAPLE) IMPLANT
CLIP APPLIE ROT 10 11.4 M/L (STAPLE) IMPLANT
CLOTH BEACON ORANGE TIMEOUT ST (SAFETY) ×3 IMPLANT
COVER SURGICAL LIGHT HANDLE (MISCELLANEOUS) ×3 IMPLANT
DECANTER SPIKE VIAL GLASS SM (MISCELLANEOUS) ×3 IMPLANT
DEVICE SUT QUICK LOAD TK 5 (STAPLE) IMPLANT
DEVICE SUT TI-KNOT TK 5X26 (MISCELLANEOUS) IMPLANT
DEVICE SUTURE ENDOST 10MM (ENDOMECHANICALS) ×3 IMPLANT
DISSECTOR BLUNT TIP ENDO 5MM (MISCELLANEOUS) ×3 IMPLANT
DRAIN PENROSE 18X1/2 LTX STRL (DRAIN) ×3 IMPLANT
DRAPE LAPAROSCOPIC ABDOMINAL (DRAPES) ×3 IMPLANT
ELECT REM PT RETURN 9FT ADLT (ELECTROSURGICAL) ×3
ELECTRODE REM PT RTRN 9FT ADLT (ELECTROSURGICAL) ×2 IMPLANT
FILTER SMOKE EVAC LAPAROSHD (FILTER) IMPLANT
GLOVE BIOGEL M 8.0 STRL (GLOVE) ×3 IMPLANT
GLOVE BIOGEL PI IND STRL 7.0 (GLOVE) IMPLANT
GLOVE BIOGEL PI INDICATOR 7.0 (GLOVE)
GOWN STRL NON-REIN LRG LVL3 (GOWN DISPOSABLE) ×3 IMPLANT
GOWN STRL REIN XL XLG (GOWN DISPOSABLE) ×6 IMPLANT
GRASPER ENDO BABCOCK 10 (MISCELLANEOUS) IMPLANT
GRASPER ENDO BABCOCK 10MM (MISCELLANEOUS)
HAND ACTIVATED (MISCELLANEOUS) ×3 IMPLANT
KIT BASIN OR (CUSTOM PROCEDURE TRAY) ×3 IMPLANT
NS IRRIG 1000ML POUR BTL (IV SOLUTION) ×3 IMPLANT
PENCIL BUTTON HOLSTER BLD 10FT (ELECTRODE) IMPLANT
SCISSORS LAP 5X35 DISP (ENDOMECHANICALS) ×3 IMPLANT
SET IRRIG TUBING LAPAROSCOPIC (IRRIGATION / IRRIGATOR) ×3 IMPLANT
SLEEVE ADV FIXATION 5X100MM (TROCAR) IMPLANT
SLEEVE XCEL OPT CAN 5 100 (ENDOMECHANICALS) ×9 IMPLANT
SLEEVE Z-THREAD 5X100MM (TROCAR) IMPLANT
SOLUTION ANTI FOG 6CC (MISCELLANEOUS) ×3 IMPLANT
STAPLER VISISTAT 35W (STAPLE) ×3 IMPLANT
STRIP CLOSURE SKIN 1/2X4 (GAUZE/BANDAGES/DRESSINGS) IMPLANT
SUT SURGIDAC NAB ES-9 0 48 120 (SUTURE) ×3 IMPLANT
SUT VIC AB 4-0 SH 18 (SUTURE) ×3 IMPLANT
SYR 30ML LL (SYRINGE) ×3 IMPLANT
TIP INNERVISION DETACH 40FR (MISCELLANEOUS) IMPLANT
TIP INNERVISION DETACH 50FR (MISCELLANEOUS) IMPLANT
TIP INNERVISION DETACH 56FR (MISCELLANEOUS) ×3 IMPLANT
TIPS INNERVISION DETACH 40FR (MISCELLANEOUS)
TRAY FOLEY CATH 14FRSI W/METER (CATHETERS) ×3 IMPLANT
TRAY LAP CHOLE (CUSTOM PROCEDURE TRAY) ×3 IMPLANT
TROCAR ADV FIXATION 11X100MM (TROCAR) IMPLANT
TROCAR ADV FIXATION 5X100MM (TROCAR) IMPLANT
TROCAR BLADELESS OPT 5 100 (ENDOMECHANICALS) ×3 IMPLANT
TROCAR XCEL BLUNT TIP 100MML (ENDOMECHANICALS) IMPLANT
TROCAR XCEL NON-BLD 11X100MML (ENDOMECHANICALS) ×3 IMPLANT
TROCAR Z-THREAD FIOS 11X100 BL (TROCAR) IMPLANT
TROCAR Z-THREAD FIOS 5X100MM (TROCAR) IMPLANT
TROCAR Z-THREAD SLEEVE 11X100 (TROCAR) IMPLANT
TUBING FILTER THERMOFLATOR (ELECTROSURGICAL) IMPLANT

## 2012-09-03 NOTE — Interval H&P Note (Signed)
History and Physical Interval Note:  09/03/2012 11:18 AM  [name]  has presented today for surgery, with the diagnosis of LARGE TYPE III MIXED HAITAUS HERNIA   The various methods of treatment have been discussed with the patient and family. After consideration of risks, benefits and other options for treatment, the patient has consented to  Procedure(s): LAP REPAIR OF LARGE TYPE III MIXED HIATUS HERNIA WITH NISSEN FUNOPLICATION (N/A) LAPAROSCOPIC REPAIR OF HIATAL HERNIA (N/A) as a surgical intervention .  The patient's history has been reviewed, patient examined, no change in status, stable for surgery.  I have reviewed the patient's chart and labs.  Questions were answered to the patient's satisfaction.     [name] B

## 2012-09-03 NOTE — Anesthesia Postprocedure Evaluation (Signed)
  Anesthesia Post-op Note  Patient: [name]  Procedure(s) Performed: Procedure(s) (LRB): LAP REPAIR OF LARGE TYPE III MIXED HIATUS HERNIA WITH NISSEN FUNOPLICATION (N/A) LAPAROSCOPIC REPAIR OF HIATAL HERNIA (N/A)  Patient Location: PACU  Anesthesia Type: General  Level of Consciousness: awake and alert   Airway and Oxygen Therapy: Patient Spontanous Breathing  Post-op Pain: mild  Post-op Assessment: Post-op Vital signs reviewed, Patient's Cardiovascular Status Stable, Respiratory Function Stable, Patent Airway and No signs of Nausea or vomiting  Last Vitals:  Filed Vitals:   09/03/12 1500  BP: 147/59  Pulse: 99  Temp:   Resp: 16    Post-op Vital Signs: stable   Complications: No apparent anesthesia complications

## 2012-09-03 NOTE — Brief Op Note (Signed)
09/03/2012  2:24 PM  PATIENT:  [name]  79 y.o. female  PRE-OPERATIVE DIAGNOSIS:  LARGE TYPE III MIXED HAITAUS HERNIA   POST-OPERATIVE DIAGNOSIS:  LARGE TYPE III MIXED HIATUS HERNIA   PROCEDURE:  Procedure(s): LAP REPAIR OF LARGE TYPE III MIXED HIATUS HERNIA WITH NISSEN FUNOPLICATION (N/A) LAPAROSCOPIC REPAIR OF HIATAL HERNIA (N/A)  SURGEON:  Surgeon(s) and Role:    * Valarie Merino, MD - Primary    * Kandis Cocking, MD - Assisting  PHYSICIAN ASSISTANT:   ASSISTANTS: Ovidio Kin, MD, FACS   ANESTHESIA:   general  EBL:  Total I/O In: 1000 [I.V.:1000] Out: 80 [Urine:80]  BLOOD ADMINISTERED:none  DRAINS: none   LOCAL MEDICATIONS USED:  MARCAINE     SPECIMEN:  No Specimen  DISPOSITION OF SPECIMEN:  N/A  COUNTS:  YES  TOURNIQUET:  * No tourniquets in log *  DICTATION: .Other Dictation: Dictation Number [id number]  PLAN OF CARE: Admit to inpatient   PATIENT DISPOSITION:  PACU - hemodynamically stable.   Delay start of Pharmacological VTE agent (>24hrs) due to surgical blood loss or risk of bleeding: no

## 2012-09-03 NOTE — Anesthesia Preprocedure Evaluation (Addendum)
Anesthesia Evaluation  Patient identified by MRN, date of birth, ID band Patient awake  General Assessment Comment:claustrophobic  Reviewed: Allergy & Precautions, H&P , NPO status , Patient's Chart, lab work & pertinent test results  Airway Mallampati: II TM Distance: >3 FB Neck ROM: Full    Dental no notable dental hx.    Pulmonary neg pulmonary ROS,  breath sounds clear to auscultation  Pulmonary exam normal       Cardiovascular hypertension, Pt. on medications + Peripheral Vascular Disease Rhythm:Regular Rate:Normal     Neuro/Psych Glaucoma negative psych ROS   GI/Hepatic Neg liver ROS, GERD-  Medicated,  Endo/Other  negative endocrine ROS  Renal/GU negative Renal ROS  negative genitourinary   Musculoskeletal negative musculoskeletal ROS (+)   Abdominal   Peds negative pediatric ROS (+)  Hematology negative hematology ROS (+)   Anesthesia Other Findings   Reproductive/Obstetrics negative OB ROS                          Anesthesia Physical Anesthesia Plan  ASA: II  Anesthesia Plan: General   Post-op Pain Management:    Induction: Intravenous  Airway Management Planned: Oral ETT  Additional Equipment:   Intra-op Plan:   Post-operative Plan: Extubation in OR  Informed Consent: I have reviewed the patients History and Physical, chart, labs and discussed the procedure including the risks, benefits and alternatives for the proposed anesthesia with the patient or authorized representative who has indicated his/her understanding and acceptance.   Dental advisory given  Plan Discussed with: CRNA and Surgeon  Anesthesia Plan Comments:         Anesthesia Quick Evaluation

## 2012-09-03 NOTE — H&P (View-Only) (Signed)
Chief Complaint:  Giant type III mixed hiatal hernia with entire stomach in chest  History of Present Illness:  [name] is an 79 y.o. female Patient referred by Dr. Dickie La with a giant hiatal hernia with her stomach in her chest. Her son is married to Lavone Nian with him I operated with at [facility] many years ago. She comes today with her daughter and her son and we discussed her long-standing problems with reflux, cough, and now dysphasia. I showed them her upper GI series and then explained laparoscopic repair of her hiatal hernia and reduction of her stomach in some detail. I explained the risks and benefits and they're aware of these. There were this is a potential to greatly improve the quality of her life.  We'll proceed with laparoscopic takedown of large type III mixed hiatal hernia with closure of the diaphragm and possible Nissen fundoplication.  Past Medical History  Diagnosis Date  . Hypertension   . Osteoporosis   . COPD (chronic obstructive pulmonary disease)   . Hyperlipidemia   . Anxiety   . Depression   . Breast cancer 13 years ago    s/p tamoxifen   . Peptic ulcer disease   . Hiatal hernia   . Ulcerative colitis   . GERD (gastroesophageal reflux disease)   . Hiatal hernia   . Osteoarthritis   . Diverticulosis   . Esophageal stricture   . Internal hemorrhoids   . Gastric outlet obstruction   . Rheumatic heart disease   . Glaucoma(365)     Past Surgical History  Procedure Laterality Date  . Breast surgery      right  . Cataract extraction  08/2006 and 10/2006  . Total knee arthroplasty      left  . Hemorrhoid surgery    . Tonsillectomy    . Abdominal hysterectomy      Current Outpatient Prescriptions  Medication Sig Dispense Refill  . aspirin 81 MG tablet Take 81 mg by mouth daily.        . Aspirin-Salicylamide-Caffeine (BC HEADACHE) 325-95-16 MG TABS Take 1 tablet by mouth as needed.        [name] azelastine (ASTELIN) 137 MCG/SPRAY nasal spray  Place 2 sprays into the nose 2 (two) times daily. Use in each nostril as directed  30 mL  12  . ciprofloxacin (CIPRO) 500 MG tablet Take 1 tablet (500 mg total) by mouth 2 (two) times daily. X 5 days  10 tablet  0  . escitalopram (LEXAPRO) 20 MG tablet Take 1 tablet (20 mg total) by mouth daily.  30 tablet  5  . hydrochlorothiazide (HYDRODIURIL) 25 MG tablet Take 1 tablet (25 mg total) by mouth daily.  30 tablet  11  . Iron 66 MG TABS Take 65 mg by mouth daily.        [name] loratadine (CLARITIN) 10 MG tablet Take 1 tablet (10 mg total) by mouth daily.  30 tablet  11  . LORazepam (ATIVAN) 2 MG tablet TAKE 1/2 TABLET BY MOUTH EVERY 8 HOURS AS NEEDED FOR ANXIETY  60 tablet  0  . Multiple Vitamins-Minerals (WOMENS MULTI VITAMIN & MINERAL PO) Take 1,000 mg by mouth daily.        . pantoprazole (PROTONIX) 40 MG tablet Take 1 tablet (40 mg total) by mouth daily.  30 tablet  11  . PATADAY 0.2 % SOLN Place 1 drop into both eyes daily.      . ranitidine (ZANTAC) 300 MG tablet Take  1 tablet (300 mg total) by mouth at bedtime.  30 tablet  1  . RESTASIS 0.05 % ophthalmic emulsion       . rosuvastatin (CRESTOR) 20 MG tablet Take 1 tablet (20 mg total) by mouth daily.  30 tablet  5  . sucralfate (CARAFATE) 1 G tablet Take 1 g by mouth 2 (two) times daily as needed.       [name] telmisartan (MICARDIS) 20 MG tablet Take 20 mg by mouth daily.      [name] telmisartan-hydrochlorothiazide (MICARDIS HCT) 40-12.5 MG per tablet Take 1 tablet by mouth daily.  30 tablet  6  . vitamin B-12 (CYANOCOBALAMIN) 1000 MCG tablet Take 1,000 mcg by mouth daily.        [name] zoster vaccine live, PF, (ZOSTAVAX) 40981 UNT/0.65ML injection Inject 19,400 Units into the skin once.  1 vial  0   No current facility-administered medications for this visit.   Demerol; Meperidine hcl; Morphine; and Nitroglycerin Family History  Problem Relation Age of Onset  . Coronary artery disease Sister     x 2 in 55's  . Coronary artery disease Brother     x 2 in  70's  . Diabetes    . Colon cancer Father 53  . Hypertension    . Stroke Mother 81  . Crohn's disease Brother   . Breast cancer      Aunt  . Cirrhosis Brother    Social History:   reports that she has never smoked. She has never used smokeless tobacco. She reports that she does not drink alcohol or use illicit drugs.   REVIEW OF SYSTEMS - PERTINENT POSITIVES ONLY: No history of DVT. Looks younger than her stated age  Physical Exam:   Blood pressure 124/74, pulse 82, resp. rate 18, height 5\' 5"  (1.651 m), weight 185 lb (83.915 kg). Body mass index is 30.79 kg/(m^2).  Gen:  WDWN White female NAD  Neurological: Alert and oriented to person, place, and time. Motor and sensory function is grossly intact  Head: Normocephalic and atraumatic.  Eyes: Conjunctivae are normal. Pupils are equal, round, and reactive to light. No scleral icterus.  Neck: Normal range of motion. Neck supple. No tracheal deviation or thyromegaly present.  Cardiovascular:  SR without murmurs or gallops.  No carotid bruits  Prior right mastectomy with latissimus dorsi flap reconstruction done in [place] by Dr. Rocco Serene. Respiratory: Effort normal.  No respiratory distress. No chest wall tenderness. Breath sounds normal.  No wheezes, rales or rhonchi.  Abdomen:  Previous Pfannenstiel incision from hysterectomy GU: Musculoskeletal: Normal range of motion. Extremities are nontender. No cyanosis, edema or clubbing noted Lymphadenopathy: No cervical, preauricular, postauricular or axillary adenopathy is present Skin: Skin is warm and dry. No rash noted. No diaphoresis. No erythema. No pallor. Pscyh: Normal mood and affect. Behavior is normal. Judgment and thought content normal.   LABORATORY RESULTS: No results found for this or any previous visit (from the past 48 hour(s)).  RADIOLOGY RESULTS: No results found.  Problem List: Patient Active Problem List   Diagnosis Date Noted  . Urinary frequency  12/09/2011  . Carotid arterial disease 10/31/2011  . UTI 05/14/2010  . HYPERGLYCEMIA 05/14/2010  . OTH SPEC ALVEOL&PARIETOALVEOL PNEUMONOPATHIES 04/28/2010  . HEMORRHOIDS-INTERNAL 03/01/2010  . GASTRIC OUTLET OBSTRUCTION 02/10/2010  . RHEUMATIC HEART DISEASE 02/02/2010  . ESOPHAGEAL STRICTURE 02/02/2010  . GERD 02/02/2010  . DUODENAL ULCER 02/02/2010  . HIATAL HERNIA 02/02/2010  . DIVERTICULOSIS, COLON 02/02/2010  . OSTEOARTHRITIS 02/02/2010  .  ASYMPTOMATIC POSTMENOPAUSAL STATUS 06/09/2008  . DECREASED HEARING, BILATERAL 02/06/2008  . PEPTIC ULCER DISEASE 06/07/2007  . HYPERLIPIDEMIA 12/19/2006  . ANXIETY 12/19/2006  . Anxiety and depression 12/19/2006  . COPD 12/19/2006  . GLAUCOMA 09/04/2006  . HYPERTENSION 09/04/2006  . OSTEOPOROSIS 09/04/2006  . Personal History of Other Diseases of Circulatory System 09/04/2006  . TOTAL HYSTERECTOMY AND BILATERAL SALPINGOOPHERECTOMY, HX OF 09/04/2006  . TONSILLECTOMY AND ADENOIDECTOMY, HX OF 09/04/2006  . PREMATURE VENTRICULAR CONTRACTIONS 10/06/1994  . MASTECTOMY, RIGHT, HX OF 04/18/1993    Assessment & Plan: Large type III mixed hiatal hernia. Plan laparoscopic/open repair of diaphragmatic hernia and Nissen fundoplication    Matt B. Daphine Deutscher, MD, [facility] [phone] beeper [phone]  08/16/2012 12:23 PM

## 2012-09-03 NOTE — Transfer of Care (Signed)
Immediate Anesthesia Transfer of Care Note  Patient: [name]  Procedure(s) Performed: Procedure(s): LAP REPAIR OF LARGE TYPE III MIXED HIATUS HERNIA WITH NISSEN FUNOPLICATION (N/A) LAPAROSCOPIC REPAIR OF HIATAL HERNIA (N/A)  Patient Location: PACU  Anesthesia Type:General  Level of Consciousness: awake, sedated and patient cooperative  Airway & Oxygen Therapy: Patient Spontanous Breathing and Patient connected to face mask oxygen  Post-op Assessment: Report given to PACU RN and Post -op Vital signs reviewed and stable  Post vital signs: Reviewed and stable  Complications: No apparent anesthesia complications

## 2012-09-04 ENCOUNTER — Encounter (HOSPITAL_COMMUNITY): Payer: Self-pay | Admitting: Surgery

## 2012-09-04 ENCOUNTER — Inpatient Hospital Stay (HOSPITAL_COMMUNITY): Payer: Medicare Other

## 2012-09-04 LAB — BASIC METABOLIC PANEL
CO2: 23 mEq/L (ref 19–32)
Chloride: 106 mEq/L (ref 96–112)
Creatinine, Ser: 0.58 mg/dL (ref 0.50–1.10)
GFR calc Af Amer: >90 mL/min (ref >90)
Potassium: 4.5 mEq/L (ref 3.5–5.1)

## 2012-09-04 LAB — CBC WITH DIFFERENTIAL/PLATELET
Eosinophils Relative: 0 % (ref 0–5)
HCT: 40.9 % (ref 36.0–46.0)
Hemoglobin: 13.1 g/dL (ref 12.0–15.0)
Lymphocytes Relative: 9 % — ABNORMAL LOW (ref 12–46)
Lymphs Abs: 0.9 10*3/uL (ref 0.7–4.0)
MCV: 98.1 fL (ref 78.0–100.0)
Monocytes Absolute: 0.6 10*3/uL (ref 0.1–1.0)
Monocytes Relative: 6 % (ref 3–12)
Neutro Abs: 9 10*3/uL — ABNORMAL HIGH (ref 1.7–7.7)
RBC: 4.17 MIL/uL (ref 3.87–5.11)
RDW: 13 % (ref 11.5–15.5)
WBC: 10.5 10*3/uL (ref 4.0–10.5)

## 2012-09-04 MED ORDER — LORAZEPAM 2 MG/ML IJ SOLN
0.5000 mg | Freq: Three times a day (TID) | INTRAMUSCULAR | Status: DC | PRN
  Administered 2012-09-04 – 2012-09-05 (×2): 1 mg via INTRAVENOUS
  Filled 2012-09-04 (×2): qty 1

## 2012-09-04 MED ORDER — IOHEXOL 300 MG/ML  SOLN
150.0000 mL | Freq: Once | INTRAMUSCULAR | Status: AC | PRN
  Administered 2012-09-04: 30 mL via ORAL

## 2012-09-04 NOTE — Progress Notes (Signed)
CARE MANAGEMENT NOTE 09/04/2012  Patient:  [name], [name]   Account Number:  [id number]  Date Initiated:  09/04/2012  Documentation initiated by:  DAVIS,RHONDA  Subjective/Objective Assessment:   pt with gastric surg, hx of cardiac problems and htn, 79y.o. soft bp     Action/Plan:   home when stable   Anticipated DC Date:  09/05/2012   Anticipated DC Plan:  HOME/SELF CARE  In-house referral  NA      DC Planning Services  NA      PAC Choice  NA   Choice offered to / List presented to:  NA   DME arranged  NA      DME agency  NA     HH arranged  NA      HH agency  NA   Status of service:  In process, will continue to follow Medicare Important Message given?  NA - LOS <3 / Initial given by admissions (If response is "NO", the following Medicare IM given date fields will be blank) Date Medicare IM given:   Date Additional Medicare IM given:    Discharge Disposition:    Per UR Regulation:  Reviewed for med. necessity/level of care/duration of stay  If discussed at Long Length of Stay Meetings, dates discussed:    Comments:  [id number]/Rhonda Earlene Plater, RN, BSN, CCM:  CHART REVIEWED AND UPDATED.  Next chart review due on 16109604. NO DISCHARGE NEEDS PRESENT AT THIS TIME. CASE MANAGEMENT [phone]

## 2012-09-04 NOTE — Progress Notes (Signed)
Notified MD Gross regarding patient's small anxiety attack, orders entered for Ativan .5-1 mg intravenous every 8 hours as needed Stanford Breed RN 09-04-2012 19:41pm

## 2012-09-04 NOTE — Op Note (Signed)
NAMEEarl, Zellmer Aundrea                  ACCOUNT NO.:  [id number]  MEDICAL RECORD NO.:  [id number]  LOCATION:  1230                         FACILITY:  [facility]  PHYSICIAN:  Thornton Park. Daphine Deutscher, MD  DATE OF BIRTH:  January 09, 1931  DATE OF PROCEDURE:  09/03/2012 DATE OF DISCHARGE:                              OPERATIVE REPORT   INDICATIONS:  A 79 year old lady with large type 3 mixed hiatal hernia with some element of axial volvulus.  PROCEDURE:  Laparoscopic takedown of incarcerated stomach within the chest, closure of diaphragm primarily and posteriorly with 4 simple sutures secured with tie knots, creation of a Nissen fundoplication over #56 lighted bougie.  SURGEON:  Thornton Park. Daphine Deutscher, MD  ASSISTANT:  Sandria Bales. Ezzard Standing, M.D..  ANESTHESIA:  General endotracheal.  DESCRIPTION OF PROCEDURE:  This 79 year old white female was taken to room #11 and given general anesthesia.  The abdomen was prepped with PCMX and draped sterilely.  Access to the abdomen was achieved through the left upper quadrant using a 5 mm 0 degree Optiview technique and three more 5 mm were inserted, 2 on the left and 1 on the right, and also a 11 was placed to near to the midline on the right.  Nathanson retractor was inserted through the 5 mm port in the upper abdomen and this retracted left lateral segment to expose a visible hole in the diaphragm where the stomach was pretty much all up inside the chest.  I was able to reduce this pretty well, grasping and pulling it down, then I incised the sac over on the right crus being at the base and carrying this anteriorly and eventually stripping the sac out of the chest and leaving it somewhat along the stomach.  Carrying this down along the left crus and also coming up along the short gastrics and getting this completely mobilized.  We were able to then put a Penrose drain posteriorly and with that in place, complete dissection to expose the posterior vagus nerve and  preserved that and get the esophagus at length into the abdomen.  The hiatus was then closed posteriorly not using pledgets, but we just interrupted Surgidac with the Endostitch and tie knots.  Four such sutures were placed and secured.  This pretty well obliterated the diaphragmatic defect.  I then passed a lighted 56 dilator down into her stomach and with that in place created a fundoplication invaginating the distal esophagus around this wrapped portion of stomach securing it with 3 sutures of Surgidac, again secured with tie knots.  Everything appeared viable.  There was no evidence of any enterotomies.  Bleeding had been controlled.  Port sites were all injected with Exparel.  The Penrose had been removed.  The wrap looked nice after the removal of the bougie and the abdomen was then deflated, port sites were closed with 4-0 Vicryl, and then with Dermabond.  The patient tolerated the procedure well and was taken to recovery room and we transferred up to the step-down unit for postoperative care.     Thornton Park Daphine Deutscher, MD     MBM/MEDQ  D:  09/03/2012  T:  09/04/2012  Job:  [id number]

## 2012-09-04 NOTE — Progress Notes (Signed)
Patient ID: [name], female   DOB: 1931/01/09, 79 y.o.   MRN: [id number] [facility] Progress Note:   1 Day Post-Op  Subjective: Mental status is clear Objective: Vital signs in last 24 hours: Temp:  [97.2 F (36.2 C)-98.5 F (36.9 C)] 98.4 F (36.9 C) (05/20 1200) Pulse Rate:  [58-99] 73 (05/20 1048) Resp:  [13-20] 19 (05/20 1048) BP: (110-149)/(50-80) 124/64 mmHg (05/20 1048) SpO2:  [90 %-99 %] 92 % (05/20 1048) Weight:  [189 lb 2.5 oz (85.8 kg)] 189 lb 2.5 oz (85.8 kg) (05/19 1546)  Intake/Output from previous day: 05/19 0701 - 05/20 0700 In: 3168.3 [I.V.:3168.3] Out: 685 [Urine:685] Intake/Output this shift: Total I/O In: 650 [P.O.:50; I.V.:600] Out: 450 [Urine:450]  Physical Exam: Work of breathing is normal.  Swallow looks good.  Will advance to clears and move to floor  Lab Results:  Results for orders placed during the hospital encounter of 09/03/12 (from the past 48 hour(s))  CBC     Status: Abnormal   Collection Time    09/03/12  4:36 PM      Result Value Range   WBC 14.4 (*) 4.0 - 10.5 K/uL   RBC 4.26  3.87 - 5.11 MIL/uL   Hemoglobin 13.8  12.0 - 15.0 g/dL   HCT 32.4  40.1 - 02.7 %   MCV 97.2  78.0 - 100.0 fL   MCH 32.4  26.0 - 34.0 pg   MCHC 33.3  30.0 - 36.0 g/dL   RDW 25.3  66.4 - 40.3 %   Platelets 190  150 - 400 K/uL  CREATININE, SERUM     Status: Abnormal   Collection Time    09/03/12  4:36 PM      Result Value Range   Creatinine, Ser 0.72  0.50 - 1.10 mg/dL   GFR calc non Af Amer 80 (*) >90 mL/min   GFR calc Af Amer >90  >90 mL/min   Comment:            The eGFR has been calculated     using the CKD EPI equation.     This calculation has not been     validated in all clinical     situations.     eGFR's persistently     <90 mL/min signify     possible Chronic Kidney Disease.  BASIC METABOLIC PANEL     Status: Abnormal   Collection Time    09/04/12  3:37 AM      Result Value Range   Sodium 137  135 - 145 mEq/L   Potassium  4.5  3.5 - 5.1 mEq/L   Chloride 106  96 - 112 mEq/L   CO2 23  19 - 32 mEq/L   Glucose, Bld 145 (*) 70 - 99 mg/dL   BUN 12  6 - 23 mg/dL   Creatinine, Ser 4.74  0.50 - 1.10 mg/dL   Calcium 8.6  8.4 - 25.9 mg/dL   GFR calc non Af Amer 85 (*) >90 mL/min   GFR calc Af Amer >90  >90 mL/min   Comment:            The eGFR has been calculated     using the CKD EPI equation.     This calculation has not been     validated in all clinical     situations.     eGFR's persistently     <90 mL/min signify     possible Chronic Kidney Disease.  CBC WITH DIFFERENTIAL     Status: Abnormal   Collection Time    09/04/12  3:37 AM      Result Value Range   WBC 10.5  4.0 - 10.5 K/uL   RBC 4.17  3.87 - 5.11 MIL/uL   Hemoglobin 13.1  12.0 - 15.0 g/dL   HCT 16.1  09.6 - 04.5 %   MCV 98.1  78.0 - 100.0 fL   MCH 31.4  26.0 - 34.0 pg   MCHC 32.0  30.0 - 36.0 g/dL   RDW 40.9  81.1 - 91.4 %   Platelets 154  150 - 400 K/uL   Neutrophils Relative % 86 (*) 43 - 77 %   Neutro Abs 9.0 (*) 1.7 - 7.7 K/uL   Lymphocytes Relative 9 (*) 12 - 46 %   Lymphs Abs 0.9  0.7 - 4.0 K/uL   Monocytes Relative 6  3 - 12 %   Monocytes Absolute 0.6  0.1 - 1.0 K/uL   Eosinophils Relative 0  0 - 5 %   Eosinophils Absolute 0.0  0.0 - 0.7 K/uL   Basophils Relative 0  0 - 1 %   Basophils Absolute 0.0  0.0 - 0.1 K/uL    Radiology/Results: Dg Ugi W/water Sol Cm  09/04/2012   *RADIOLOGY REPORT*  Clinical Data:Post take down of hiatal hernia and Nissan fundiplication pools in the gastric fundus.  ESOPHAGUS/BARIUM SWALLOW/TABLET STUDY  Fluoroscopy Time: 2 min and 6 seconds  Comparison: Upper GI 06/27/2012  Findings: Under fluoroscopic observation, the patient swallowed approximately 30 ml of water soluble contrast. The distal esophagus is tortuous.  Contrast flowed through the gastroesophageal junction which is below the diaphragm.  The hiatal hernia has been reduced and is completely below the diaphragms.  There is no movement of  contrast beyond the gastric antrum in the course the study.  As there was residual contrast within the esophagus, no additional volume was administered.  IMPRESSION:  1.  The hiatal hernia is reduced below the diaphragm.  2.  Contrast flows through the Nissen fundoplication wrap. 3.  There is residual contrast within the esophagus. 4.  No movement of contrast out of the stomach into the duodenum during the course the study.  Only 30 ml of volume was administered.  No additional volumes administered due to residual contrast within the esophagus.   Original Report Authenticated By: Genevive Bi, M.D.    Anti-infectives: Anti-infectives   Start     Dose/Rate Route Frequency Ordered Stop   09/03/12 0845  ceFAZolin (ANCEF) IVPB 2 g/50 mL premix     2 g 100 mL/hr over 30 Minutes Intravenous On call to O.R. 09/03/12 0831 09/03/12 1144   09/03/12 0830  ceFAZolin (ANCEF) 3 g in dextrose 5 % 50 mL IVPB  Status:  Discontinued     3 g 160 mL/hr over 30 Minutes Intravenous On call to O.R. 09/03/12 0829 09/03/12 0830      Assessment/Plan: Problem List: Patient Active Problem List   Diagnosis Date Noted  . Large type III mixed hiatus hernia 08/16/2012  . Urinary frequency 12/09/2011  . Carotid arterial disease 10/31/2011  . UTI 05/14/2010  . HYPERGLYCEMIA 05/14/2010  . OTH SPEC ALVEOL&PARIETOALVEOL PNEUMONOPATHIES 04/28/2010  . HEMORRHOIDS-INTERNAL 03/01/2010  . GASTRIC OUTLET OBSTRUCTION 02/10/2010  . RHEUMATIC HEART DISEASE 02/02/2010  . ESOPHAGEAL STRICTURE 02/02/2010  . GERD 02/02/2010  . DUODENAL ULCER 02/02/2010  . HIATAL HERNIA 02/02/2010  . DIVERTICULOSIS, COLON 02/02/2010  . OSTEOARTHRITIS 02/02/2010  .  ASYMPTOMATIC POSTMENOPAUSAL STATUS 06/09/2008  . DECREASED HEARING, BILATERAL 02/06/2008  . PEPTIC ULCER DISEASE 06/07/2007  . HYPERLIPIDEMIA 12/19/2006  . ANXIETY 12/19/2006  . Anxiety and depression 12/19/2006  . COPD 12/19/2006  . GLAUCOMA 09/04/2006  . HYPERTENSION  09/04/2006  . OSTEOPOROSIS 09/04/2006  . Personal History of Other Diseases of Circulatory System 09/04/2006  . TOTAL HYSTERECTOMY AND BILATERAL SALPINGOOPHERECTOMY, HX OF 09/04/2006  . TONSILLECTOMY AND ADENOIDECTOMY, HX OF 09/04/2006  . PREMATURE VENTRICULAR CONTRACTIONS 10/06/1994  . MASTECTOMY, RIGHT, HX OF 04/18/1993    Stable and appears to be doing well.  1 Day Post-Op    LOS: 1 day   Matt B. Daphine Deutscher, MD, [facility] [phone] beeper [phone]  09/04/2012 1:49 PM

## 2012-09-05 LAB — BASIC METABOLIC PANEL
BUN: 9 mg/dL (ref 6–23)
CO2: 27 mEq/L (ref 19–32)
Calcium: 8.7 mg/dL (ref 8.4–10.5)
Chloride: 102 mEq/L (ref 96–112)
Creatinine, Ser: 0.53 mg/dL (ref 0.50–1.10)
Glucose, Bld: 151 mg/dL — ABNORMAL HIGH (ref 70–99)
Potassium: 4 mEq/L (ref 3.5–5.1)

## 2012-09-05 LAB — CBC WITH DIFFERENTIAL/PLATELET
Basophils Absolute: 0 10*3/uL (ref 0.0–0.1)
Eosinophils Absolute: 0 10*3/uL (ref 0.0–0.7)
Eosinophils Relative: 0 % (ref 0–5)
HCT: 39.3 % (ref 36.0–46.0)
Lymphocytes Relative: 9 % — ABNORMAL LOW (ref 12–46)
MCH: 31.3 pg (ref 26.0–34.0)
MCV: 97.8 fL (ref 78.0–100.0)
Monocytes Absolute: 0.6 10*3/uL (ref 0.1–1.0)
Platelets: 127 10*3/uL — ABNORMAL LOW (ref 150–400)
RDW: 13.1 % (ref 11.5–15.5)
WBC: 8 10*3/uL (ref 4.0–10.5)

## 2012-09-05 MED FILL — Cefazolin Sodium for IV Soln 2 GM and Dextrose 3% (50 ML): INTRAVENOUS | Qty: 50 | Status: AC

## 2012-09-05 NOTE — Progress Notes (Signed)
Patient ID: [name], female   DOB: [birth year], 79 y.o.   MRN: [id number] [facility] Progress Note:   2 Days Post-Op  Subjective: Mental status is clear Objective: Vital signs in last 24 hours: Temp:  [98.4 F (36.9 C)-99.1 F (37.3 C)] 99.1 F (37.3 C) (05/21 0620) Pulse Rate:  [73-101] 84 (05/21 0620) Resp:  [16-19] 18 (05/21 0620) BP: (124-150)/(64-83) 124/80 mmHg (05/21 0620) SpO2:  [87 %-99 %] 88 % (05/21 0620)  Intake/Output from previous day: 05/20 0701 - 05/21 0700 In: 2670 [P.O.:170; I.V.:2500] Out: 1200 [Urine:1200] Intake/Output this shift:    Physical Exam: Work of breathing is normal.  Had trouble sleeping.  Incisions OK  Lab Results:  Results for orders placed during the hospital encounter of 09/03/12 (from the past 48 hour(s))  CBC     Status: Abnormal   Collection Time    09/03/12  4:36 PM      Result Value Range   WBC 14.4 (*) 4.0 - 10.5 K/uL   RBC 4.26  3.87 - 5.11 MIL/uL   Hemoglobin 13.8  12.0 - 15.0 g/dL   HCT 40.9  81.1 - 91.4 %   MCV 97.2  78.0 - 100.0 fL   MCH 32.4  26.0 - 34.0 pg   MCHC 33.3  30.0 - 36.0 g/dL   RDW 78.2  95.6 - 21.3 %   Platelets 190  150 - 400 K/uL  CREATININE, SERUM     Status: Abnormal   Collection Time    09/03/12  4:36 PM      Result Value Range   Creatinine, Ser 0.72  0.50 - 1.10 mg/dL   GFR calc non Af Amer 80 (*) >90 mL/min   GFR calc Af Amer >90  >90 mL/min   Comment:            The eGFR has been calculated     using the CKD EPI equation.     This calculation has not been     validated in all clinical     situations.     eGFR's persistently     <90 mL/min signify     possible Chronic Kidney Disease.  BASIC METABOLIC PANEL     Status: Abnormal   Collection Time    09/04/12  3:37 AM      Result Value Range   Sodium 137  135 - 145 mEq/L   Potassium 4.5  3.5 - 5.1 mEq/L   Chloride 106  96 - 112 mEq/L   CO2 23  19 - 32 mEq/L   Glucose, Bld 145 (*) 70 - 99 mg/dL   BUN 12  6 - 23 mg/dL    Creatinine, Ser 0.86  0.50 - 1.10 mg/dL   Calcium 8.6  8.4 - 57.8 mg/dL   GFR calc non Af Amer 85 (*) >90 mL/min   GFR calc Af Amer >90  >90 mL/min   Comment:            The eGFR has been calculated     using the CKD EPI equation.     This calculation has not been     validated in all clinical     situations.     eGFR's persistently     <90 mL/min signify     possible Chronic Kidney Disease.  CBC WITH DIFFERENTIAL     Status: Abnormal   Collection Time    09/04/12  3:37 AM      Result Value  Range   WBC 10.5  4.0 - 10.5 K/uL   RBC 4.17  3.87 - 5.11 MIL/uL   Hemoglobin 13.1  12.0 - 15.0 g/dL   HCT 16.1  09.6 - 04.5 %   MCV 98.1  78.0 - 100.0 fL   MCH 31.4  26.0 - 34.0 pg   MCHC 32.0  30.0 - 36.0 g/dL   RDW 40.9  81.1 - 91.4 %   Platelets 154  150 - 400 K/uL   Neutrophils Relative % 86 (*) 43 - 77 %   Neutro Abs 9.0 (*) 1.7 - 7.7 K/uL   Lymphocytes Relative 9 (*) 12 - 46 %   Lymphs Abs 0.9  0.7 - 4.0 K/uL   Monocytes Relative 6  3 - 12 %   Monocytes Absolute 0.6  0.1 - 1.0 K/uL   Eosinophils Relative 0  0 - 5 %   Eosinophils Absolute 0.0  0.0 - 0.7 K/uL   Basophils Relative 0  0 - 1 %   Basophils Absolute 0.0  0.0 - 0.1 K/uL  BASIC METABOLIC PANEL     Status: Abnormal   Collection Time    09/05/12  4:20 AM      Result Value Range   Sodium 136  135 - 145 mEq/L   Potassium 4.0  3.5 - 5.1 mEq/L   Chloride 102  96 - 112 mEq/L   CO2 27  19 - 32 mEq/L   Glucose, Bld 151 (*) 70 - 99 mg/dL   BUN 9  6 - 23 mg/dL   Creatinine, Ser 7.82  0.50 - 1.10 mg/dL   Calcium 8.7  8.4 - 95.6 mg/dL   GFR calc non Af Amer 88 (*) >90 mL/min   GFR calc Af Amer >90  >90 mL/min   Comment:            The eGFR has been calculated     using the CKD EPI equation.     This calculation has not been     validated in all clinical     situations.     eGFR's persistently     <90 mL/min signify     possible Chronic Kidney Disease.  CBC WITH DIFFERENTIAL     Status: Abnormal   Collection Time     09/05/12  4:20 AM      Result Value Range   WBC 8.0  4.0 - 10.5 K/uL   RBC 4.02  3.87 - 5.11 MIL/uL   Hemoglobin 12.6  12.0 - 15.0 g/dL   HCT 21.3  08.6 - 57.8 %   MCV 97.8  78.0 - 100.0 fL   MCH 31.3  26.0 - 34.0 pg   MCHC 32.1  30.0 - 36.0 g/dL   RDW 46.9  62.9 - 52.8 %   Platelets 127 (*) 150 - 400 K/uL   Neutrophils Relative % 84 (*) 43 - 77 %   Neutro Abs 6.8  1.7 - 7.7 K/uL   Lymphocytes Relative 9 (*) 12 - 46 %   Lymphs Abs 0.7  0.7 - 4.0 K/uL   Monocytes Relative 7  3 - 12 %   Monocytes Absolute 0.6  0.1 - 1.0 K/uL   Eosinophils Relative 0  0 - 5 %   Eosinophils Absolute 0.0  0.0 - 0.7 K/uL   Basophils Relative 0  0 - 1 %   Basophils Absolute 0.0  0.0 - 0.1 K/uL    Radiology/Results: Dg Ugi W/water Sol Cm  09/04/2012   *RADIOLOGY REPORT*  Clinical Data:Post take down of hiatal hernia and Nissan fundiplication pools in the gastric fundus.  ESOPHAGUS/BARIUM SWALLOW/TABLET STUDY  Fluoroscopy Time: 2 min and 6 seconds  Comparison: Upper GI 06/27/2012  Findings: Under fluoroscopic observation, the patient swallowed approximately 30 ml of water soluble contrast. The distal esophagus is tortuous.  Contrast flowed through the gastroesophageal junction which is below the diaphragm.  The hiatal hernia has been reduced and is completely below the diaphragms.  There is no movement of contrast beyond the gastric antrum in the course the study.  As there was residual contrast within the esophagus, no additional volume was administered.  IMPRESSION:  1.  The hiatal hernia is reduced below the diaphragm.  2.  Contrast flows through the Nissen fundoplication wrap. 3.  There is residual contrast within the esophagus. 4.  No movement of contrast out of the stomach into the duodenum during the course the study.  Only 30 ml of volume was administered.  No additional volumes administered due to residual contrast within the esophagus.   Original Report Authenticated By: Genevive Bi, M.D.     Anti-infectives: Anti-infectives   Start     Dose/Rate Route Frequency Ordered Stop   09/03/12 0845  ceFAZolin (ANCEF) IVPB 2 g/50 mL premix     2 g 100 mL/hr over 30 Minutes Intravenous On call to O.R. 09/03/12 0831 09/03/12 1144   09/03/12 0830  ceFAZolin (ANCEF) 3 g in dextrose 5 % 50 mL IVPB  Status:  Discontinued     3 g 160 mL/hr over 30 Minutes Intravenous On call to O.R. 09/03/12 0829 09/03/12 0830      Assessment/Plan: Problem List: Patient Active Problem List   Diagnosis Date Noted  . Large type III mixed hiatus hernia 08/16/2012  . Urinary frequency 12/09/2011  . Carotid arterial disease 10/31/2011  . UTI 05/14/2010  . HYPERGLYCEMIA 05/14/2010  . OTH SPEC ALVEOL&PARIETOALVEOL PNEUMONOPATHIES 04/28/2010  . HEMORRHOIDS-INTERNAL 03/01/2010  . GASTRIC OUTLET OBSTRUCTION 02/10/2010  . RHEUMATIC HEART DISEASE 02/02/2010  . ESOPHAGEAL STRICTURE 02/02/2010  . GERD 02/02/2010  . DUODENAL ULCER 02/02/2010  . HIATAL HERNIA 02/02/2010  . DIVERTICULOSIS, COLON 02/02/2010  . OSTEOARTHRITIS 02/02/2010  . ASYMPTOMATIC POSTMENOPAUSAL STATUS 06/09/2008  . DECREASED HEARING, BILATERAL 02/06/2008  . PEPTIC ULCER DISEASE 06/07/2007  . HYPERLIPIDEMIA 12/19/2006  . ANXIETY 12/19/2006  . Anxiety and depression 12/19/2006  . COPD 12/19/2006  . GLAUCOMA 09/04/2006  . HYPERTENSION 09/04/2006  . OSTEOPOROSIS 09/04/2006  . Personal History of Other Diseases of Circulatory System 09/04/2006  . TOTAL HYSTERECTOMY AND BILATERAL SALPINGOOPHERECTOMY, HX OF 09/04/2006  . TONSILLECTOMY AND ADENOIDECTOMY, HX OF 09/04/2006  . PREMATURE VENTRICULAR CONTRACTIONS 10/06/1994  . MASTECTOMY, RIGHT, HX OF 04/18/1993    Doing well postop.  Just started on clears.  Will continue on clears for today. 2 Days Post-Op    LOS: 2 days   Matt B. Daphine Deutscher, MD, [facility] [phone] beeper [phone]  09/05/2012 8:51 AM

## 2012-09-06 ENCOUNTER — Other Ambulatory Visit (INDEPENDENT_AMBULATORY_CARE_PROVIDER_SITE_OTHER): Payer: Self-pay

## 2012-09-06 LAB — CBC WITH DIFFERENTIAL/PLATELET
Basophils Absolute: 0 10*3/uL (ref 0.0–0.1)
Basophils Relative: 0 % (ref 0–1)
Eosinophils Relative: 1 % (ref 0–5)
HCT: 40 % (ref 36.0–46.0)
MCHC: 33.3 g/dL (ref 30.0–36.0)
MCV: 94.6 fL (ref 78.0–100.0)
Monocytes Absolute: 0.5 10*3/uL (ref 0.1–1.0)
Neutro Abs: 6.6 10*3/uL (ref 1.7–7.7)
Platelets: 136 10*3/uL — ABNORMAL LOW (ref 150–400)
RDW: 12.9 % (ref 11.5–15.5)
WBC: 8.1 10*3/uL (ref 4.0–10.5)

## 2012-09-06 LAB — BASIC METABOLIC PANEL
Calcium: 8.8 mg/dL (ref 8.4–10.5)
Creatinine, Ser: 0.45 mg/dL — ABNORMAL LOW (ref 0.50–1.10)
GFR calc Af Amer: >90 mL/min (ref >90)
Sodium: 136 mEq/L (ref 135–145)

## 2012-09-06 MED ORDER — HYDROCODONE-ACETAMINOPHEN 5-325 MG PO TABS: 1.0000 | ORAL_TABLET | ORAL | Status: DC | PRN

## 2012-09-06 MED ORDER — ONDANSETRON HCL 4 MG PO TABS: 4.0000 mg | ORAL_TABLET | Freq: Four times a day (QID) | ORAL | Status: DC | PRN

## 2012-09-06 NOTE — Progress Notes (Signed)
Patient ID: [name], female   DOB: [birth year], 79 y.o.   MRN: [id number] [facility] Progress Note:   3 Days Post-Op  Subjective: Mental status is clear Objective: Vital signs in last 24 hours: Temp:  [98.1 F (36.7 C)-98.7 F (37.1 C)] 98.7 F (37.1 C) (05/22 0511) Pulse Rate:  [100-111] 109 (05/22 0511) Resp:  [18-20] 18 (05/22 0511) BP: (139-142)/(79-87) 142/87 mmHg (05/22 0511) SpO2:  [93 %-95 %] 95 % (05/22 0511)  Intake/Output from previous day: 05/21 0701 - 05/22 0700 In: 3000 [P.O.:600; I.V.:2400] Out: 300 [Urine:300] Intake/Output this shift: Total I/O In: 240 [P.O.:240] Out: 400 [Urine:400]  Physical Exam: Work of breathing is not labored.  Incisions OK.  Tolerating clears.  Lab Results:  Results for orders placed during the hospital encounter of 09/03/12 (from the past 48 hour(s))  BASIC METABOLIC PANEL     Status: Abnormal   Collection Time    09/05/12  4:20 AM      Result Value Range   Sodium 136  135 - 145 mEq/L   Potassium 4.0  3.5 - 5.1 mEq/L   Chloride 102  96 - 112 mEq/L   CO2 27  19 - 32 mEq/L   Glucose, Bld 151 (*) 70 - 99 mg/dL   BUN 9  6 - 23 mg/dL   Creatinine, Ser 2.84  0.50 - 1.10 mg/dL   Calcium 8.7  8.4 - 13.2 mg/dL   GFR calc non Af Amer 88 (*) >90 mL/min   GFR calc Af Amer >90  >90 mL/min   Comment:            The eGFR has been calculated     using the CKD EPI equation.     This calculation has not been     validated in all clinical     situations.     eGFR's persistently     <90 mL/min signify     possible Chronic Kidney Disease.  CBC WITH DIFFERENTIAL     Status: Abnormal   Collection Time    09/05/12  4:20 AM      Result Value Range   WBC 8.0  4.0 - 10.5 K/uL   RBC 4.02  3.87 - 5.11 MIL/uL   Hemoglobin 12.6  12.0 - 15.0 g/dL   HCT 44.0  10.2 - 72.5 %   MCV 97.8  78.0 - 100.0 fL   MCH 31.3  26.0 - 34.0 pg   MCHC 32.1  30.0 - 36.0 g/dL   RDW 36.6  44.0 - 34.7 %   Platelets 127 (*) 150 - 400 K/uL   Neutrophils Relative % 84 (*) 43 - 77 %   Neutro Abs 6.8  1.7 - 7.7 K/uL   Lymphocytes Relative 9 (*) 12 - 46 %   Lymphs Abs 0.7  0.7 - 4.0 K/uL   Monocytes Relative 7  3 - 12 %   Monocytes Absolute 0.6  0.1 - 1.0 K/uL   Eosinophils Relative 0  0 - 5 %   Eosinophils Absolute 0.0  0.0 - 0.7 K/uL   Basophils Relative 0  0 - 1 %   Basophils Absolute 0.0  0.0 - 0.1 K/uL  CBC WITH DIFFERENTIAL     Status: Abnormal   Collection Time    09/06/12  5:16 AM      Result Value Range   WBC 8.1  4.0 - 10.5 K/uL   RBC 4.23  3.87 - 5.11 MIL/uL   Hemoglobin  13.3  12.0 - 15.0 g/dL   HCT 45.4  09.8 - 11.9 %   MCV 94.6  78.0 - 100.0 fL   MCH 31.4  26.0 - 34.0 pg   MCHC 33.3  30.0 - 36.0 g/dL   RDW 14.7  82.9 - 56.2 %   Platelets 136 (*) 150 - 400 K/uL   Neutrophils Relative % 82 (*) 43 - 77 %   Neutro Abs 6.6  1.7 - 7.7 K/uL   Lymphocytes Relative 11 (*) 12 - 46 %   Lymphs Abs 0.9  0.7 - 4.0 K/uL   Monocytes Relative 6  3 - 12 %   Monocytes Absolute 0.5  0.1 - 1.0 K/uL   Eosinophils Relative 1  0 - 5 %   Eosinophils Absolute 0.0  0.0 - 0.7 K/uL   Basophils Relative 0  0 - 1 %   Basophils Absolute 0.0  0.0 - 0.1 K/uL  BASIC METABOLIC PANEL     Status: Abnormal   Collection Time    09/06/12  5:16 AM      Result Value Range   Sodium 136  135 - 145 mEq/L   Potassium 3.9  3.5 - 5.1 mEq/L   Comment: SLIGHT HEMOLYSIS   Chloride 103  96 - 112 mEq/L   CO2 25  19 - 32 mEq/L   Glucose, Bld 148 (*) 70 - 99 mg/dL   BUN 4 (*) 6 - 23 mg/dL   Creatinine, Ser 1.30 (*) 0.50 - 1.10 mg/dL   Calcium 8.8  8.4 - 86.5 mg/dL   GFR calc non Af Amer >90  >90 mL/min   GFR calc Af Amer >90  >90 mL/min   Comment:            The eGFR has been calculated     using the CKD EPI equation.     This calculation has not been     validated in all clinical     situations.     eGFR's persistently     <90 mL/min signify     possible Chronic Kidney Disease.    Radiology/Results: Dg Ugi W/water Sol Cm  09/04/2012    *RADIOLOGY REPORT*  Clinical Data:Post take down of hiatal hernia and Nissan fundiplication pools in the gastric fundus.  ESOPHAGUS/BARIUM SWALLOW/TABLET STUDY  Fluoroscopy Time: 2 min and 6 seconds  Comparison: Upper GI 06/27/2012  Findings: Under fluoroscopic observation, the patient swallowed approximately 30 ml of water soluble contrast. The distal esophagus is tortuous.  Contrast flowed through the gastroesophageal junction which is below the diaphragm.  The hiatal hernia has been reduced and is completely below the diaphragms.  There is no movement of contrast beyond the gastric antrum in the course the study.  As there was residual contrast within the esophagus, no additional volume was administered.  IMPRESSION:  1.  The hiatal hernia is reduced below the diaphragm.  2.  Contrast flows through the Nissen fundoplication wrap. 3.  There is residual contrast within the esophagus. 4.  No movement of contrast out of the stomach into the duodenum during the course the study.  Only 30 ml of volume was administered.  No additional volumes administered due to residual contrast within the esophagus.   Original Report Authenticated By: Genevive Bi, M.D.    Anti-infectives: Anti-infectives   Start     Dose/Rate Route Frequency Ordered Stop   09/03/12 0845  ceFAZolin (ANCEF) IVPB 2 g/50 mL premix     2 g 100  mL/hr over 30 Minutes Intravenous On call to O.R. 09/03/12 0831 09/03/12 1144   09/03/12 0830  ceFAZolin (ANCEF) 3 g in dextrose 5 % 50 mL IVPB  Status:  Discontinued     3 g 160 mL/hr over 30 Minutes Intravenous On call to O.R. 09/03/12 0829 09/03/12 0830      Assessment/Plan: Problem List: Patient Active Problem List   Diagnosis Date Noted  . Large type III mixed hiatus hernia 08/16/2012  . Urinary frequency 12/09/2011  . Carotid arterial disease 10/31/2011  . UTI 05/14/2010  . HYPERGLYCEMIA 05/14/2010  . OTH SPEC ALVEOL&PARIETOALVEOL PNEUMONOPATHIES 04/28/2010  . HEMORRHOIDS-INTERNAL  03/01/2010  . GASTRIC OUTLET OBSTRUCTION 02/10/2010  . RHEUMATIC HEART DISEASE 02/02/2010  . ESOPHAGEAL STRICTURE 02/02/2010  . GERD 02/02/2010  . DUODENAL ULCER 02/02/2010  . HIATAL HERNIA 02/02/2010  . DIVERTICULOSIS, COLON 02/02/2010  . OSTEOARTHRITIS 02/02/2010  . ASYMPTOMATIC POSTMENOPAUSAL STATUS 06/09/2008  . DECREASED HEARING, BILATERAL 02/06/2008  . PEPTIC ULCER DISEASE 06/07/2007  . HYPERLIPIDEMIA 12/19/2006  . ANXIETY 12/19/2006  . Anxiety and depression 12/19/2006  . COPD 12/19/2006  . GLAUCOMA 09/04/2006  . HYPERTENSION 09/04/2006  . OSTEOPOROSIS 09/04/2006  . Personal History of Other Diseases of Circulatory System 09/04/2006  . TOTAL HYSTERECTOMY AND BILATERAL SALPINGOOPHERECTOMY, HX OF 09/04/2006  . TONSILLECTOMY AND ADENOIDECTOMY, HX OF 09/04/2006  . PREMATURE VENTRICULAR CONTRACTIONS 10/06/1994  . MASTECTOMY, RIGHT, HX OF 04/18/1993    Advance to full liquids.  Hopeful discharge tomorrow 3 Days Post-Op    LOS: 3 days   Matt B. Daphine Deutscher, MD, [facility] [phone] beeper [phone]  09/06/2012 8:48 AM

## 2012-09-06 NOTE — Discharge Summary (Signed)
Physician Discharge Summary  Patient ID: [name] MRN: [id number] DOB/AGE: Apr 01, 1931 79 y.o.  Admit date: 09/03/2012 Discharge date: 09/06/2012  Admission Diagnoses:  Large type III mixed hiatus hernia with stomach in chest  Discharge Diagnoses:  same  Active Problems:   * No active hospital problems. *   Surgery:  Lap repair of hiatus hernia and Nissen fundoplication  Discharged Condition: improved  Hospital Course:   Had surgery.  Kept in stepdown overnight.  Transferred to floor and ready to go home today  Consults: none  Significant Diagnostic Studies: UGI    Discharge Exam: Blood pressure 142/87, pulse 109, temperature 98.7 F (37.1 C), temperature source Oral, resp. rate 18, height 5\' 5"  (1.651 m), weight 189 lb 2.5 oz (85.8 kg), SpO2 95.00%. Doing well.  Incisions bland.  No pain  Disposition:   Discharge Orders   Future Appointments Provider Department Dept Phone   09/20/2012 10:30 AM Valarie Merino, MD [facility], [place] [phone]   Future Orders Complete By Expires     Call MD for:  persistant nausea and vomiting  As directed     Discharge instructions  As directed     Comments:      Full liquids by mouth for a week then advance to a pureed diet for 4 weeks before returning to regular diet    Increase activity slowly  As directed     No wound care  As directed         Medication List    STOP taking these medications       BC HEADACHE 325-95-16 MG Tabs  Generic drug:  Aspirin-Salicylamide-Caffeine     ranitidine 300 MG tablet  Commonly known as:  ZANTAC     sucralfate 1 G tablet  Commonly known as:  CARAFATE      TAKE these medications       aspirin 81 MG tablet  Take 81 mg by mouth daily.     azelastine 137 MCG/SPRAY nasal spray  Commonly known as:  ASTELIN  Place 1 spray into the nose 2 (two) times daily as needed (for allergies). Use in each nostril as directed     escitalopram 20 MG tablet  Commonly known as:  LEXAPRO   Take 20 mg by mouth every morning.     HYDROcodone-acetaminophen 5-325 MG per tablet  Commonly known as:  NORCO/VICODIN  Take 1-2 tablets by mouth every 4 (four) hours as needed for pain.     Iron 66 MG Tabs  Take 65 mg by mouth daily.     LORazepam 1 MG tablet  Commonly known as:  ATIVAN  Take 1 mg by mouth every 8 (eight) hours as needed for anxiety.     multivitamin with minerals Tabs  Take 1 tablet by mouth daily.     ondansetron 4 MG tablet  Commonly known as:  ZOFRAN  Take 1 tablet (4 mg total) by mouth every 6 (six) hours as needed for nausea.     PATADAY 0.2 % Soln  Generic drug:  Olopatadine HCl  Place 1 drop into both eyes every morning.     RESTASIS 0.05 % ophthalmic emulsion  Generic drug:  cycloSPORINE  Place 1 drop into both eyes at bedtime.     rosuvastatin 20 MG tablet  Commonly known as:  CRESTOR  Take 20 mg by mouth at bedtime.     telmisartan-hydrochlorothiazide 40-12.5 MG per tablet  Commonly known as:  MICARDIS HCT  Take 0.5  tablets by mouth daily.     vitamin B-12 1000 MCG tablet  Commonly known as:  CYANOCOBALAMIN  Take 1,000 mcg by mouth daily.           Follow-up Information   Follow up with Valarie Merino, MD.   Contact information:   [address] [place] [address] [phone]       Signed: Valarie Merino 09/06/2012, 10:13 AM

## 2012-09-06 NOTE — Care Management Note (Signed)
    Page 1 of 2   09/06/2012     1:06:22 PM   CARE MANAGEMENT NOTE 09/06/2012  Patient:  MARJORIA, MANCILLAS A   Account Number:  [id number]  Date Initiated:  09/04/2012  Documentation initiated by:  DAVIS,RHONDA  Subjective/Objective Assessment:   pt with gastric surg, hx of cardiac problems and htn, 79y.o. soft bp     Action/Plan:   home when stable   Anticipated DC Date:  09/05/2012   Anticipated DC Plan:  HOME/SELF CARE  In-house referral  NA      DC Planning Services  CM consult      PAC Choice  DURABLE MEDICAL EQUIPMENT   Choice offered to / List presented to:     DME arranged  3-N-1      DME agency  [facility]     HH arranged  NA      HH agency  NA   Status of service:  Completed, signed off Medicare Important Message given?  NA - LOS <3 / Initial given by admissions (If response is "NO", the following Medicare IM given date fields will be blank) Date Medicare IM given:   Date Additional Medicare IM given:    Discharge Disposition:  HOME/SELF CARE  Per UR Regulation:  Reviewed for med. necessity/level of care/duration of stay  If discussed at Long Length of Stay Meetings, dates discussed:    Comments:  [id number]/Rhonda Earlene Plater, RN, BSN, CCM:  CHART REVIEWED AND UPDATED.  Next chart review due on 16109604. NO DISCHARGE NEEDS PRESENT AT THIS TIME. CASE MANAGEMENT [phone]

## 2012-09-07 ENCOUNTER — Telehealth (INDEPENDENT_AMBULATORY_CARE_PROVIDER_SITE_OTHER): Payer: Self-pay | Admitting: General Surgery

## 2012-09-07 NOTE — Telephone Encounter (Signed)
Pt's daughter called to go over medicine for prevention of constipation and nutrition.  Advised daughter to provide Miralax (or generic) 1 capful in 10-12 oz water BID with Colace (or generic) 1 capsule TID to QID, especially while taking pain medicine.  If bowels become too loose, cut back on Colace; once no longer taking pain medicine, can drop back to QD on the Miralax.  Emphasized importance of having a BM every day.  Daughter explained what she has prepared for nutrition for next week or two, and it sound very appropriate for post op Nissen.  They will call back for any questions or problems.  Reminded of post op appt with Dr. Daphine Deutscher.

## 2012-09-12 ENCOUNTER — Telehealth: Payer: Self-pay | Admitting: Emergency Medicine

## 2012-09-12 ENCOUNTER — Encounter: Payer: Self-pay | Admitting: Internal Medicine

## 2012-09-12 ENCOUNTER — Telehealth (INDEPENDENT_AMBULATORY_CARE_PROVIDER_SITE_OTHER): Payer: Self-pay

## 2012-09-12 ENCOUNTER — Ambulatory Visit (INDEPENDENT_AMBULATORY_CARE_PROVIDER_SITE_OTHER): Payer: Medicare Other | Admitting: Internal Medicine

## 2012-09-12 VITALS — BP 100/62 | HR 90 | Temp 97.9°F | Ht 64.5 in | Wt 180.8 lb

## 2012-09-12 DIAGNOSIS — R05 Cough: Secondary | ICD-10-CM

## 2012-09-12 MED ORDER — FLUTTER DEVI: Status: DC

## 2012-09-12 MED ORDER — LEVOFLOXACIN 750 MG PO TABS: 750.0000 mg | ORAL_TABLET | Freq: Every day | ORAL | Status: DC

## 2012-09-12 MED ORDER — PREDNISONE (PAK) 10 MG PO TABS: ORAL_TABLET | ORAL | Status: DC

## 2012-09-12 MED ORDER — PANTOPRAZOLE SODIUM 40 MG PO TBEC: 40.0000 mg | DELAYED_RELEASE_TABLET | Freq: Every day | ORAL | Status: DC

## 2012-09-12 NOTE — Patient Instructions (Addendum)
Pantoprazole (protonix) 40 mg (prilosec)   Take 30-60 min before first meal of the day and Pepcid 20 mg (zantac 150mg )one bedtime until return to office - this is the best way to tell whether stomach acid is contributing to your problem.    Levaquin 750 one daily x 5 days and take prednisone x 6 days if not turning corner  Take delsym two tsp (or mucinex dm but they are large) every 12 hours and supplement if needed with   vicodin every 4 hours to suppress the urge to cough. Swallowing water or using ice chips/non mint and menthol containing candies (such as lifesavers or sugarless jolly ranchers) are also effective.  You should rest your voice and avoid activities that you know make you cough.  Once you have eliminated the cough for 3 straight days try reducing the vicodin first,  then the delsym as tolerated.    GERD (REFLUX)  is an extremely common cause of respiratory symptoms, many times with no significant heartburn at all.    It can be treated with medication, but also with lifestyle changes including avoidance of late meals, excessive alcohol, smoking cessation, and avoid fatty foods, chocolate, peppermint, colas, red wine, and acidic juices such as orange juice.  NO MINT OR MENTHOL PRODUCTS SO NO COUGH DROPS  USE SUGARLESS CANDY INSTEAD (jolley ranchers or Stover's)  NO OIL BASED VITAMINS - use powdered substitutes.    Keep appoint to see DR Byrum in 2 weeks

## 2012-09-12 NOTE — Progress Notes (Signed)
Subjective:    Patient ID: [name], female    DOB: March 11, 1931   MRN: [id number] HPI 49 yowf never smoker, hx ulcerative colitis, breast CA. Admitted for UTI, A Fib + RVR. During that hospitalization noted to have B infilltrates, was treated empirically for ? pulm edema or PNA w abx. Appearance on film and clinical hx more consistent with COP. Treated with pred 40mg  once daily starting 12/30 w clinical improvement, now follows up.   In retrospect has has dyspnea with exertion evaluated by Dr Laury Axon, Treated beginning in Spring '11 w Advair for ? COPD (never smoked but 2nd hand smoke)  ROV 05/31/10 -- follow up visit for B infiltrates, possible COP. We have been tapering steroids, planning a slightly slower taper for her ulcerative colitis. Breathing is better than last visit, but still some SOB with significant exertion, example shopping all day. Has been taking Advair once daily on most days; has PFT that show mixed disease.   ROV 08/16/10 -- Hx of B infiltrates, probable COP. Has been off pred since March, was doing well off of it. Stopped Advair last visit, did well off of it. She noticed more exertional SOB over the last 2 weeks. Also more allergy sx, with nasal congestion and gtt. Has been treated w abx x 2 since last visit. Some of it may be depression, she is having a hard time living alone with the loss of her husband. Hemoglobin stable on last check by Dr [name]. She injured her R ribs when bending over about 10 days ago. She was having the SOB before the rib injury  ROV 09/09/10 -- follows up for her DOE. She is better than a month ago - rib pain better. Breathing better - still feels a bit run down. She is going to slowly increase her activity. Allergy symptoms are a bit better  ROV 12/12/11 -- hx of dyspnea and infiltrates, felt to be most consistent with COP. CXR in May without infiltrates. She reports today that her cough is bothering her again. She is not on an allergy regimen anymore.  Was tried on Nexium x 2 weeks by Dr Laury Axon without change in her cough. She is having daily clear watery PND.   ROV 01/17/12 -- Follow up for Dyspnea, cough. Last time we started loratadine and astelin >> her cough and drainage improved. She finished the loratadine and then the cough has worsened. She stopped the astelin on her own - difficult to take. Due for CXR given hx COP, wants to defer until next time.  rec Restart the loratadine 10mg  daily Stop astelin spray Start nasonex 2 sprays each nostril daily  09/12/2012  Acute  ov/Nathanel Tallman re chronic cough exac - present for over a year  Chief Complaint  Patient presents with  . Acute Visit    Pt c/o increased cough and hoarsness for the past few days. She states cough is non prod. Her throat feels irritated for cough.   previous chronic cough is dry, this cough is different in that rattling and productive yellowing s/p nf 10 days prior to OV. No dysphagia or obvious aspiration, cp fever or sob  No obvious daytime variabilty or assoc c  chest tightness, subjective wheeze overt sinus or hb symptoms. No unusual exp hx or h/o childhood pna/ asthma or premature birth to her knowledge.   Sleeping ok without nocturnal  or early am exacerbation  of respiratory  c/o's or need for noct saba. Also denies any obvious fluctuation of  symptoms with weather or environmental changes or other aggravating or alleviating factors except as outlined above  Current Medications, Allergies, Past Medical History, Past Surgical History, Family History, and Social History were reviewed in [employer] record.  ROS  The following are not active complaints unless bolded sore throat, dysphagia, dental problems, itching, sneezing,  nasal congestion or excess/ purulent secretions, ear ache,   fever, chills, sweats, unintended wt loss, pleuritic or exertional cp, hemoptysis,  orthopnea pnd or leg swelling, presyncope, palpitations, heartburn, abdominal pain,  anorexia, nausea, vomiting, diarrhea  or change in bowel or urinary habits, change in stools or urine, dysuria,hematuria,  rash, arthralgias, visual complaints, headache, numbness weakness or ataxia or problems with walking or coordination,  change in mood/affect or memory.           Objective:   Physical Exam  Gen: Pleasant, well-nourished, in no distress,  normal affect with nl vital signs  ENT: No lesions,  mouth clear,  oropharynx clear, no postnasal drip  Neck: No JVD, no TMG, no carotid bruits  Lungs: No use of accessory muscles, subtle insp squeek at LUL > R apex  Cardiovascular: RRR, heart sounds normal, soft systolic M, no peripheral edema  Musculoskeletal: No deformities, no cyanosis or clubbing  Neuro: alert, non focal  Skin: Warm, no lesions or rashes   Assessment & Plan:

## 2012-09-12 NOTE — Telephone Encounter (Signed)
Pts daughter called stating pt has cough that is progressing. No fever. Not productive. Pt seen in past by Dr Delton Coombes for COP. Daughter advised to have pt see Dr Delton Coombes or an pulmonary associate to eval cough. She states she understands and will call their office for appt today.

## 2012-09-12 NOTE — Telephone Encounter (Signed)
Pt had surgery for hiatal hernia on 09-03-12 and has had ongoing cough since then.  Pt's surgeon's office would like for pt to have a cxr today.  PT given appt with Dr Sherene Sires today at 3:00.

## 2012-09-13 DIAGNOSIS — R05 Cough: Secondary | ICD-10-CM | POA: Insufficient documentation

## 2012-09-13 NOTE — Assessment & Plan Note (Addendum)
Explained natural history of acute resp infectionsand why it's necessary in patients at risk to treat GERD aggressively  at least  short term   to reduce risk of evolving cyclical cough initially  triggered by epithelial injury and a heightened sensitivty to the effects of any upper airway irritants,  most importantly acid - related.  That is, the more sensitive the epithelium damaged for virus, the more the cough, the more the secondary reflux (especially in those prone to reflux) the more the irritation of the sensitive mucosa and so on in a cyclical pattern, esp since she is s/p NF  There is a risk of late hcap here so rx with levaquin/ flutter and regroup in 2 weeks, sooner if not better  See instructions for specific recommendations which were reviewed directly with the patient who was given a copy with highlighter outlining the key components.

## 2012-09-15 ENCOUNTER — Encounter: Payer: Self-pay | Admitting: Internal Medicine

## 2012-09-19 ENCOUNTER — Telehealth (INDEPENDENT_AMBULATORY_CARE_PROVIDER_SITE_OTHER): Payer: Self-pay

## 2012-09-19 NOTE — Telephone Encounter (Signed)
The patient called with questions about her surgery.  She wants to know what her stomach looks like and if it's healed.  She wonders if a xray will be done.  She wants to know what she can do.  She is coming tomorrow for a follow up and I told her to write her questions down and ask Dr Daphine Deutscher.

## 2012-09-20 ENCOUNTER — Ambulatory Visit (INDEPENDENT_AMBULATORY_CARE_PROVIDER_SITE_OTHER): Payer: Medicare Other | Admitting: Surgery

## 2012-09-20 ENCOUNTER — Encounter (INDEPENDENT_AMBULATORY_CARE_PROVIDER_SITE_OTHER): Payer: Self-pay | Admitting: Surgery

## 2012-09-20 VITALS — BP 130/90 | HR 68 | Temp 97.3°F | Resp 16 | Ht 65.0 in | Wt 176.2 lb

## 2012-09-20 DIAGNOSIS — Z09 Encounter for follow-up examination after completed treatment for conditions other than malignant neoplasm: Secondary | ICD-10-CM

## 2012-09-20 DIAGNOSIS — Z9889 Other specified postprocedural states: Secondary | ICD-10-CM | POA: Insufficient documentation

## 2012-09-20 NOTE — Progress Notes (Signed)
[name] 79 y.o.  Body mass index is 29.32 kg/(m^2).  Patient Active Problem List   Diagnosis Date Noted  . Lap Repair Large Hiatal Hernia with Nissen May 2014 09/20/2012  . Cough 09/13/2012  . Large type III mixed hiatus hernia 08/16/2012  . Urinary frequency 12/09/2011  . Carotid arterial disease 10/31/2011  . UTI 05/14/2010  . HYPERGLYCEMIA 05/14/2010  . OTH SPEC ALVEOL&PARIETOALVEOL PNEUMONOPATHIES 04/28/2010  . HEMORRHOIDS-INTERNAL 03/01/2010  . GASTRIC OUTLET OBSTRUCTION 02/10/2010  . RHEUMATIC HEART DISEASE 02/02/2010  . ESOPHAGEAL STRICTURE 02/02/2010  . GERD 02/02/2010  . DUODENAL ULCER 02/02/2010  . HIATAL HERNIA 02/02/2010  . DIVERTICULOSIS, COLON 02/02/2010  . OSTEOARTHRITIS 02/02/2010  . ASYMPTOMATIC POSTMENOPAUSAL STATUS 06/09/2008  . DECREASED HEARING, BILATERAL 02/06/2008  . PEPTIC ULCER DISEASE 06/07/2007  . HYPERLIPIDEMIA 12/19/2006  . ANXIETY 12/19/2006  . Anxiety and depression 12/19/2006  . COPD 12/19/2006  . GLAUCOMA 09/04/2006  . HYPERTENSION 09/04/2006  . OSTEOPOROSIS 09/04/2006  . Personal History of Other Diseases of Circulatory System 09/04/2006  . TOTAL HYSTERECTOMY AND BILATERAL SALPINGOOPHERECTOMY, HX OF 09/04/2006  . TONSILLECTOMY AND ADENOIDECTOMY, HX OF 09/04/2006  . PREMATURE VENTRICULAR CONTRACTIONS 10/06/1994  . MASTECTOMY, RIGHT, HX OF 04/18/1993    Allergies  Allergen Reactions  . Demerol (Meperidine) Swelling    Throat swelling   . Meperidine Hcl Swelling    REACTION: THROAT SWELLING  . Morphine     REACTION: SWELLING  . Nitroglycerin     Very low bp    Past Surgical History  Procedure Laterality Date  . Cataract extraction  08/2006 and 10/2006  . Total knee arthroplasty      left  . Hemorrhoid surgery    . Tonsillectomy    . Abdominal hysterectomy    . Joint replacement      right  . Mastectomy  1995    right  . Breast surgery      right  . Breast reconstruction  2000    right  . Laparoscopic nissen  fundoplication N/A 09/03/2012    Procedure: LAP REPAIR OF LARGE TYPE III MIXED HIATUS HERNIA WITH NISSEN FUNOPLICATION;  Surgeon: Valarie Merino, MD;  Location: [facility];  Service: General;  Laterality: N/A;  . Hiatal hernia repair N/A 09/03/2012    Procedure: LAPAROSCOPIC REPAIR OF HIATAL HERNIA;  Surgeon: Valarie Merino, MD;  Location: [facility];  Service: General;  Laterality: N/A;   [name], DO 1. Lap Repair Large Hiatal Hernia with Nissen May 2014     Doing well.  Saw Francella Solian for deep cough.  Her put her on some Prilosec and Ranitidine but I'm not convinced that it is reflux.  She looks good.  Has been off her lexapro.  Will try to wean from some of the meds that she may not need.  Diet still pureed.  Will see back in 6 weeks after diet will hopefully be at regular.  Matt B. Daphine Deutscher, MD, [facility] [phone] beeper [phone]  09/20/2012 11:20 AM

## 2012-09-20 NOTE — Patient Instructions (Signed)
Thanks for your patience.  If you need further assistance after leaving the office, please call our office and speak with a [facility] nurse.  [phone].  If you want to leave a message for Dr. Crystle Carelli, please call his office phone at [phone]. 

## 2012-09-25 ENCOUNTER — Telehealth: Payer: Self-pay | Admitting: Emergency Medicine

## 2012-09-25 NOTE — Telephone Encounter (Signed)
Kristen returned call & will be available after 2:00 today.  Antionette Fairy

## 2012-09-25 NOTE — Telephone Encounter (Signed)
Called, left detailed message on Kristen's VM informing her of RB's recs as stated below Due to the nature of the message, will call back to ensure message was received before closing

## 2012-09-25 NOTE — Telephone Encounter (Signed)
Called, spoke with [facility] with [facility].  She visited with pt today.  States pt has rales bilaterally and a wet cough.  Cough is more wet recently and is nonprod.  Pt denied SOB, wheezing, chest tightness, or fever.  Per Baxter Hire, pt "feels fine."  Pt did have hital hernia surgery approx 3 wks ago.  She does have an incentive spirometer and is using it.  Per Fritzi Mandes, all of pt's meds have been on hold since pt's surgery.  Pt has an OV with RB in 2 days.  Kristen unsure if RB would like pt to be seen sooner or if he has any recs until her pending OV.  RB, pls advise.  Thank you.

## 2012-09-25 NOTE — Telephone Encounter (Signed)
LMOM TCB x1 for [employer]

## 2012-09-25 NOTE — Telephone Encounter (Signed)
I think as long as she feels well I can see her as planned on Thursday

## 2012-09-25 NOTE — Telephone Encounter (Signed)
LMOM TCB x2 for [employer]

## 2012-09-26 NOTE — Telephone Encounter (Signed)
lmomtcb for [employer]

## 2012-09-27 ENCOUNTER — Ambulatory Visit (INDEPENDENT_AMBULATORY_CARE_PROVIDER_SITE_OTHER): Payer: Medicare Other | Admitting: Emergency Medicine

## 2012-09-27 ENCOUNTER — Encounter: Payer: Self-pay | Admitting: Emergency Medicine

## 2012-09-27 VITALS — BP 110/82 | HR 89 | Temp 97.5°F | Ht 64.5 in | Wt 176.2 lb

## 2012-09-27 DIAGNOSIS — J8409 Other alveolar and parieto-alveolar conditions: Secondary | ICD-10-CM

## 2012-09-27 DIAGNOSIS — R05 Cough: Secondary | ICD-10-CM

## 2012-09-27 NOTE — Assessment & Plan Note (Signed)
Suspect a component of chronic aspiration, which should be improved by her Nissen.  - will check CXR next  - consider CT scan if changing clinically

## 2012-09-27 NOTE — Patient Instructions (Addendum)
Please continue omeprazole in the morning and zantac in the evenings.  Advance your diet carefully as directed by Dr Daphine Deutscher recommends. Please let us (and Dr Daphine Deutscher) know if your cough changes as your diet is advanced.  Follow with Dr Delton Coombes in 6 months or sooner if you have any problems

## 2012-09-27 NOTE — Assessment & Plan Note (Signed)
Suspect most related to her esophageal sgy, GERD. Should actually be better in the end if her nissen protects her from reflux.  - will follow her cough and overall status as her diet gets advanced.

## 2012-09-27 NOTE — Progress Notes (Signed)
Subjective:    Patient ID: [name], female    DOB: [birth year], 79y.o.   MRN: [id number] HPI 79 yo woman, never smoker, hx ulcerative colitis, breast CA. Admitted for UTI, A Fib + RVR. During that hospitalization noted to have B infilltrates, was treated empirically for ? pulm edema or PNA w abx. Appearance on film and clinical hx more consistent with COP. Treated with pred 40mg  once daily starting 12/30 w clinical improvement, now follows up.   In retrospect has has dyspnea with exertion evaluated by Dr Laury Axon, Treated beginning in Spring '11 w Advair for ? COPD (never smoked but 2nd hand smoke)  ROV 05/31/10 -- follow up visit for B infiltrates, possible COP. We have been tapering steroids, planning a slightly slower taper for her ulcerative colitis. Breathing is better than last visit, but still some SOB with significant exertion, example shopping all day. Has been taking Advair once daily on most days; has PFT that show mixed disease.   ROV 08/16/10 -- Hx of B infiltrates, probable COP. Has been off pred since March, was doing well off of it. Stopped Advair last visit, did well off of it. She noticed more exertional SOB over the last 2 weeks. Also more allergy sx, with nasal congestion and gtt. Has been treated w abx x 2 since last visit. Some of it may be depression, she is having a hard time living alone with the loss of her husband. Hemoglobin stable on last check by Dr [name]. She injured her R ribs when bending over about 10 days ago. She was having the SOB before the rib injury  ROV 09/09/10 -- follows up for her DOE. She is better than a month ago - rib pain better. Breathing better - still feels a bit run down. She is going to slowly increase her activity. Allergy symptoms are a bit better  ROV 12/12/11 -- hx of dyspnea and infiltrates, felt to be most consistent with COP. CXR in May without infiltrates. She reports today that her cough is bothering her again. She is not on an allergy  regimen anymore. Was tried on Nexium x 2 weeks by Dr Laury Axon without change in her cough. She is having daily clear watery PND.   ROV 01/17/12 -- Follow up for Dyspnea, cough. Last time we started loratadine and astelin >> her cough and drainage improved. She finished the loratadine and then the cough has worsened. She stopped the astelin on her own - difficult to take. Due for CXR given hx COP, wants to defer until next time.   ROV 09/27/12 -- Follow up for Dyspnea, chronic cough. Hx of ulcerative colitis, breast CA. Admitted for UTI, A Fib + RVR, suspected COP in the past. Was seen by Dr Sherene Sires last month with continued cough. She is s/p recent HH and Nissen repair. Was treated with omeprazole, zantac, levaquin. She did not take the prednisone. Returns today reporting improvement in cough.   Review of Systems As above    Objective:   Physical Exam Filed Vitals:   09/27/12 1359  BP: 110/82  Pulse: 89  Temp: 97.5 F (36.4 C)  TempSrc: Oral  Height: 5' 4.5" (1.638 m)  Weight: 176 lb 3.2 oz (79.924 kg)  SpO2: 95%   Gen: Pleasant, well-nourished, in no distress,  normal affect  ENT: No lesions,  mouth clear,  oropharynx clear, no postnasal drip  Neck: No JVD, no TMG, no carotid bruits  Lungs: No use of accessory muscles, subtle insp squeek  at LUL > R apex  Cardiovascular: RRR, heart sounds normal, soft systolic M, no peripheral edema  Musculoskeletal: No deformities, no cyanosis or clubbing  Neuro: alert, non focal  Skin: Warm, no lesions or rashes   Assessment & Plan:  OTH SPEC ALVEOL&PARIETOALVEOL PNEUMONOPATHIES Suspect a component of chronic aspiration, which should be improved by her Nissen.  - will check CXR next  - consider CT scan if changing clinically  Cough Suspect most related to her esophageal sgy, GERD. Should actually be better in the end if her nissen protects her from reflux.  - will follow her cough and overall status as her diet gets advanced.

## 2012-09-27 NOTE — Telephone Encounter (Signed)
Still have never heard from [facility] spoke with patient who stated that she is doing "fine" and is okay to wait for appt with RB this afternoon Nothing further needed; will sign off.

## 2012-09-28 ENCOUNTER — Telehealth: Payer: Self-pay | Admitting: Emergency Medicine

## 2012-09-28 NOTE — Telephone Encounter (Signed)
Will sign off message then

## 2012-10-09 ENCOUNTER — Telehealth (INDEPENDENT_AMBULATORY_CARE_PROVIDER_SITE_OTHER): Payer: Self-pay

## 2012-10-09 NOTE — Telephone Encounter (Signed)
Patient is stating she is having frequency reflux so that makes her not wanting to eat. She is taking zantac OTC. I ask her did she pick up her protonix that DR. Wert had ordered on 09/12/12. She stated NO. Advised her to pickup her protonix at the pharmacy. She should limit her intake of spicy , greasy foods. She should eat 2-3 hrs prior to going to bed . IF  she continues to have reflux after starting her protonix she should follow up with her PCP

## 2012-10-17 ENCOUNTER — Telehealth (INDEPENDENT_AMBULATORY_CARE_PROVIDER_SITE_OTHER): Payer: Self-pay | Admitting: General Surgery

## 2012-10-17 NOTE — Telephone Encounter (Signed)
Patient's daughter calling because mother is having trouble with constipation. She has been taking miralax and colace daily and was having daily bowel movements. She stopped the colace. Daughter states she is still taking a cap full of miralax daily but it has been several days since her last bowel movement. I advised patient to continue miralax and re-start colace. She needs to make sure she is drinking plenty of water and I advised her to start milk of magnesia. She is to try this and if she does not have a bowel movement in 24 hours she is to call back. Daughter agrees with this plan.

## 2012-10-22 ENCOUNTER — Other Ambulatory Visit: Payer: Self-pay | Admitting: Family Medicine

## 2012-10-22 NOTE — Telephone Encounter (Signed)
Last seen 07/09/12 and filled 08/17/12 #60. Please advise     KP

## 2012-10-31 ENCOUNTER — Ambulatory Visit (INDEPENDENT_AMBULATORY_CARE_PROVIDER_SITE_OTHER): Payer: Medicare Other | Admitting: Surgery

## 2012-10-31 ENCOUNTER — Encounter (INDEPENDENT_AMBULATORY_CARE_PROVIDER_SITE_OTHER): Payer: Self-pay | Admitting: Surgery

## 2012-10-31 VITALS — BP 148/90 | HR 76 | Resp 14 | Ht 66.0 in | Wt 170.4 lb

## 2012-10-31 DIAGNOSIS — K449 Diaphragmatic hernia without obstruction or gangrene: Secondary | ICD-10-CM

## 2012-10-31 NOTE — Patient Instructions (Addendum)
Thanks for your patience.  If you need further assistance after leaving the office, please call our office and speak with a [facility] nurse.  [phone].  If you want to leave a message for Dr. Crosby Bevan, please call his office phone at [phone]. 

## 2012-10-31 NOTE — Progress Notes (Signed)
followup visit for [name] and her daughter. We talked along, but her frustrations and getting over her surgery more importantly just accommodating issues in her life. Her son [name] and his wife [name] been somewhat estranged from her for bowel and she is upset about that. I think that her Nissen is helping her breathing and coughing some extent. Have tried to encourage her to reduce the number of medications that she takes. I think I will see her again in 6 months for routine followup.

## 2012-11-06 ENCOUNTER — Encounter: Payer: Self-pay | Admitting: Family Medicine

## 2012-11-06 ENCOUNTER — Ambulatory Visit (INDEPENDENT_AMBULATORY_CARE_PROVIDER_SITE_OTHER): Payer: Medicare Other | Admitting: Family Medicine

## 2012-11-06 VITALS — BP 148/92 | HR 96 | Resp 22 | Wt 170.6 lb

## 2012-11-06 DIAGNOSIS — I10 Essential (primary) hypertension: Secondary | ICD-10-CM

## 2012-11-06 DIAGNOSIS — F411 Generalized anxiety disorder: Secondary | ICD-10-CM

## 2012-11-06 DIAGNOSIS — E669 Obesity, unspecified: Secondary | ICD-10-CM

## 2012-11-06 NOTE — Patient Instructions (Signed)
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
## 2012-11-06 NOTE — Assessment & Plan Note (Signed)
Pt was much calmer when by the time her daughter arrived--- she took 1/2 of an ativan  Once she calmed down her daughter took her home and they will all stay together tonight

## 2012-11-12 ENCOUNTER — Encounter (INDEPENDENT_AMBULATORY_CARE_PROVIDER_SITE_OTHER): Payer: Medicare Other

## 2012-11-12 DIAGNOSIS — I6529 Occlusion and stenosis of unspecified carotid artery: Secondary | ICD-10-CM

## 2012-11-17 NOTE — Progress Notes (Signed)
US carotid stable However---thyroid looked abnormal---  Need US thyroid And check TSH, free T3, free T4---- diagnosis abnormal thyroid US

## 2012-12-12 ENCOUNTER — Encounter: Payer: Self-pay | Admitting: Family Medicine

## 2012-12-12 ENCOUNTER — Ambulatory Visit (INDEPENDENT_AMBULATORY_CARE_PROVIDER_SITE_OTHER): Payer: Medicare Other | Admitting: Family Medicine

## 2012-12-12 VITALS — BP 136/86 | HR 66 | Temp 98.6°F | Ht 65.0 in | Wt 170.6 lb

## 2012-12-12 DIAGNOSIS — N390 Urinary tract infection, site not specified: Secondary | ICD-10-CM

## 2012-12-12 LAB — POCT URINALYSIS DIPSTICK
Nitrite, UA: POSITIVE
Protein, UA: 30
Urobilinogen, UA: 0.2
pH, UA: 6

## 2012-12-12 MED ORDER — SACCHAROMYCES BOULARDII 250 MG PO CAPS: 250.0000 mg | ORAL_CAPSULE | Freq: Two times a day (BID) | ORAL | Status: DC

## 2012-12-12 MED ORDER — CEPHALEXIN 500 MG PO CAPS: 500.0000 mg | ORAL_CAPSULE | Freq: Two times a day (BID) | ORAL | Status: DC

## 2012-12-12 NOTE — Progress Notes (Signed)
  Subjective:    Patient ID: [name], female    DOB: 10/03/1930, 79 y.o.   MRN: [id number]  HPI UTI- sxs started 7-10 days ago.  + dysuria, suprapubic pressure.  + frequency, urgency, incomplete emptying, hesitancy.  No fevers/chills.  No back pain.   Review of Systems For ROS see HPI     Objective:   Physical Exam  Vitals reviewed. Constitutional: She appears well-developed and well-nourished. No distress.  Abdominal: Soft. She exhibits no distension. There is no tenderness (no suprapubic or CVA tenderness).          Assessment & Plan:

## 2012-12-12 NOTE — Patient Instructions (Addendum)
This is a UTI Start the Keflex twice daily for the infection Take the Florastor twice daily as a pro-biotic to improve the good bacteria Call with any questions or concerns Hang in there!

## 2012-12-14 ENCOUNTER — Other Ambulatory Visit: Payer: Self-pay | Admitting: *Deleted

## 2012-12-14 MED ORDER — CIPROFLOXACIN HCL 500 MG PO TABS: 500.0000 mg | ORAL_TABLET | Freq: Two times a day (BID) | ORAL | Status: DC

## 2012-12-17 NOTE — Assessment & Plan Note (Signed)
Pt's sxs and UA consistent w/ infxn.  Start abx.  Reviewed supportive care and red flags that should prompt return.  Pt expressed understanding and is in agreement w/ plan.  

## 2012-12-20 ENCOUNTER — Other Ambulatory Visit: Payer: Self-pay | Admitting: Family Medicine

## 2012-12-21 NOTE — Telephone Encounter (Signed)
Last seen 11/06/12 and filled 10/22/12 #60. No UDS or contract on file. Please advise     KP

## 2013-01-03 ENCOUNTER — Encounter: Payer: Self-pay | Admitting: Family Medicine

## 2013-01-15 ENCOUNTER — Encounter (INDEPENDENT_AMBULATORY_CARE_PROVIDER_SITE_OTHER): Payer: Self-pay

## 2013-01-30 ENCOUNTER — Encounter (INDEPENDENT_AMBULATORY_CARE_PROVIDER_SITE_OTHER): Payer: Self-pay

## 2013-01-30 ENCOUNTER — Ambulatory Visit (INDEPENDENT_AMBULATORY_CARE_PROVIDER_SITE_OTHER): Payer: Medicare Other | Admitting: Surgery

## 2013-01-30 ENCOUNTER — Encounter (INDEPENDENT_AMBULATORY_CARE_PROVIDER_SITE_OTHER): Payer: Self-pay | Admitting: Surgery

## 2013-01-30 VITALS — BP 110/78 | HR 84 | Temp 98.2°F | Ht 65.0 in | Wt 178.0 lb

## 2013-01-30 DIAGNOSIS — R1310 Dysphagia, unspecified: Secondary | ICD-10-CM

## 2013-01-30 NOTE — Progress Notes (Signed)
[name] 79 y.o.  Body mass index is 29.62 kg/(m^2).  Patient Active Problem List   Diagnosis Date Noted  . Lap Repair Large Hiatal Hernia with Nissen May 2014 09/20/2012  . Cough 09/13/2012  . Large type III mixed hiatus hernia 08/16/2012  . Urinary frequency 12/09/2011  . Carotid arterial disease 10/31/2011  . UTI 05/14/2010  . HYPERGLYCEMIA 05/14/2010  . OTH SPEC ALVEOL&PARIETOALVEOL PNEUMONOPATHIES 04/28/2010  . HEMORRHOIDS-INTERNAL 03/01/2010  . GASTRIC OUTLET OBSTRUCTION 02/10/2010  . RHEUMATIC HEART DISEASE 02/02/2010  . ESOPHAGEAL STRICTURE 02/02/2010  . GERD 02/02/2010  . DUODENAL ULCER 02/02/2010  . HIATAL HERNIA 02/02/2010  . DIVERTICULOSIS, COLON 02/02/2010  . OSTEOARTHRITIS 02/02/2010  . ASYMPTOMATIC POSTMENOPAUSAL STATUS 06/09/2008  . DECREASED HEARING, BILATERAL 02/06/2008  . PEPTIC ULCER DISEASE 06/07/2007  . HYPERLIPIDEMIA 12/19/2006  . ANXIETY 12/19/2006  . Anxiety and depression 12/19/2006  . COPD 12/19/2006  . GLAUCOMA 09/04/2006  . HYPERTENSION 09/04/2006  . OSTEOPOROSIS 09/04/2006  . Personal History of Other Diseases of Circulatory System 09/04/2006  . TOTAL HYSTERECTOMY AND BILATERAL SALPINGOOPHERECTOMY, HX OF 09/04/2006  . TONSILLECTOMY AND ADENOIDECTOMY, HX OF 09/04/2006  . PREMATURE VENTRICULAR CONTRACTIONS 10/06/1994  . MASTECTOMY, RIGHT, HX OF 04/18/1993    Allergies  Allergen Reactions  . Demerol [Meperidine] Swelling    Throat swelling   . Meperidine Hcl Swelling    REACTION: THROAT SWELLING  . Morphine     REACTION: SWELLING  . Nitroglycerin     Very low bp    Past Surgical History  Procedure Laterality Date  . Cataract extraction  08/2006 and 10/2006  . Total knee arthroplasty      left  . Hemorrhoid surgery    . Tonsillectomy    . Abdominal hysterectomy    . Joint replacement      right  . Mastectomy  1995    right  . Breast surgery      right  . Breast reconstruction  2000    right  . Laparoscopic nissen  fundoplication N/A 09/03/2012    Procedure: LAP REPAIR OF LARGE TYPE III MIXED HIATUS HERNIA WITH NISSEN FUNOPLICATION;  Surgeon: Valarie Merino, MD;  Location: [facility];  Service: General;  Laterality: N/A;  . Hiatal hernia repair N/A 09/03/2012    Procedure: LAPAROSCOPIC REPAIR OF HIATAL HERNIA;  Surgeon: Valarie Merino, MD;  Location: [facility];  Service: General;  Laterality: N/A;   [name], DO 1. Dysphagia     He is having some problems with dysphagia when she eats. It may be that she tried to eat too fast but it's not clear. She lost weight and she looks really good. Her cough has improved if not gone away completely. Because she's having dysphasia we'll get an upper GI series which will be both instructed to look at the anatomy but also the physiology of her swallowing. I will see her back after that S. Series and will review it. With her. Matt B. Daphine Deutscher, MD, [facility] [phone] beeper [phone]  01/30/2013 5:54 PM

## 2013-01-30 NOTE — Patient Instructions (Signed)
Nissen Fundoplication Care After Please read the instructions outlined below and refer to this sheet for the next few weeks. These discharge instructions provide you with general information on caring for yourself after you leave the hospital. Your doctor may also give you specific instructions. While your treatment has been planned according to the most current medical practices available, unavoidable complications sometimes happen. If you have any problems or questions after discharge, please call your doctor. ACTIVITY  Take frequent rest periods throughout the day.  Take frequent walks throughout the day. This will help to prevent blood clots.  Continue to do your coughing and deep breathing exercises once you get home. This will help to prevent pneumonia.  No strenuous activities such as heavy lifting, pushing or pulling until after your follow-up visit with your doctor. Do not lift anything heavier than 10 pounds.  Talk with your caregiver about when you may return to work and your exercise routine.  You may shower 2 days after surgery. Pat incisions dry. Do not rub incisions with washcloth or towel.  Do not drive while taking prescription pain medication. NUTRITION  Continue with a liquid diet, or the diet you were directed to take, until your first follow-up visit with your surgeon.  Drink fluids (6-8 glasses a day).  Call your caregiver for persistent nausea (feeling sick to your stomach), vomiting, bloating or difficulty swallowing. ELIMINATION It is very important not to strain during bowel movements. If constipation should occur, you may:  Take a mild laxative (such as Milk of Magnesia).  Add fruit and bran to your diet.  Drink more fluids.  Call your caregiver if constipation is not relieved. FEVER If you feel feverish or have shaking chills, take your temperature. If it is 102 F (38.9 C) or above, call your caregiver. The fever may mean there is an infection. PAIN  CONTROL  If a prescription was given for a pain reliever, please follow your caregiver's directions.  Only take over-the-counter or prescription medicines for pain, discomfort, or fever as directed by your caregiver.  If the pain is not relieved by your medicine, becomes worse, or you have difficulty breathing, call your doctor. INCISION  It is normal for your cuts (incisions) from surgery to have a small amount of drainage for the first 1-2 days. Once the drainage has stopped, leave your incision(s) open to air.  Check your incision(s) and surrounding area daily for any redness, swelling, increased drainage or bleeding. If any of these are present or if the wound edges start to separate, call your doctor.  If you have small adhesive strips in place, they will peel and fall off. (If these strips are covered with a clear bandage, your doctor will tell you when to remove them.)  If you have staples, your caregiver will remove them at the follow-up appointment. Document Released: 11/26/2003 Document Revised: 06/27/2011 Document Reviewed: 03/01/2007 Exit[employer] Patient Information 2014 [employer].  

## 2013-01-31 ENCOUNTER — Telehealth (INDEPENDENT_AMBULATORY_CARE_PROVIDER_SITE_OTHER): Payer: Self-pay | Admitting: *Deleted

## 2013-01-31 NOTE — Telephone Encounter (Signed)
I spoke with pt and made her aware of appt for UGI at [facility] on 02/08/13 with an arrival time of 9:15 am.  Also instructed pt to be NPO after midnight.

## 2013-02-05 ENCOUNTER — Telehealth (INDEPENDENT_AMBULATORY_CARE_PROVIDER_SITE_OTHER): Payer: Self-pay | Admitting: General Surgery

## 2013-02-05 ENCOUNTER — Telehealth: Payer: Self-pay | Admitting: *Deleted

## 2013-02-05 NOTE — Telephone Encounter (Signed)
Patient called and stated that she has a lot of questions for the nurse. Called patient and left message to return call to office. SW

## 2013-02-05 NOTE — Telephone Encounter (Signed)
Pt called for advice.  Her son is very ill and her daughter-in-law will not let her visit again until she can prove she is free of TB and MRSA.  Advise pt to contact her PCP for these screens.  She understands and will call Dr. Laury Axon.

## 2013-02-06 ENCOUNTER — Other Ambulatory Visit: Payer: Self-pay | Admitting: Family Medicine

## 2013-02-07 NOTE — Telephone Encounter (Signed)
Patient returned call to office. Patient states that she needs to know what to do. Patient's son wife called and told patient that she would need blood work done, TB skin testing and MRSA test done before she is allowed to see her son. Patient is very confused on what she needs to do. Patient was seen back in July and was instructed to follow up in 3 weeks for blood pressure recheck. Patient failed to do so. Patient was offered an appointment for next week do to her having appointments tomorrow but patient refused, she wants to see what provider would say first. Please advise on what the patient needs to do. SW, CMA

## 2013-02-07 NOTE — Telephone Encounter (Signed)
Called and left message on voicemail to return call to office.  

## 2013-02-07 NOTE — Telephone Encounter (Signed)
Last seen 11/06/12 and filled 12/20/12 #60 UDS on 12/12/12 low risk.  Please advise    KP

## 2013-02-07 NOTE — Telephone Encounter (Signed)
Are those instructions from Hospital?  We can do those but she needs ov--- can do nose culture for mrsa and tb in blood

## 2013-02-07 NOTE — Telephone Encounter (Signed)
The instructions came for son's wife. Will call patient and schedule OV.

## 2013-02-08 ENCOUNTER — Ambulatory Visit
Admission: RE | Admit: 2013-02-08 | Discharge: 2013-02-08 | Disposition: A | Discharge status: Home / Self Care | Payer: Medicare Other | Source: Ambulatory Visit | Attending: Surgery | Admitting: Surgery

## 2013-02-08 DIAGNOSIS — R1310 Dysphagia, unspecified: Secondary | ICD-10-CM

## 2013-02-27 ENCOUNTER — Telehealth (INDEPENDENT_AMBULATORY_CARE_PROVIDER_SITE_OTHER): Payer: Self-pay | Admitting: *Deleted

## 2013-02-27 NOTE — Telephone Encounter (Signed)
Patient called asking about her results from her UGI from 02/08/13.  Patient states she is still having some difficulty swallowing.  Patient asking what the plan is moving forward.  Explained that I would send a message to Dr. Daphine Deutscher to ask for his review and plan then we will let her know hopefully this afternoon.

## 2013-03-05 ENCOUNTER — Encounter: Payer: Self-pay | Admitting: Family Medicine

## 2013-03-05 ENCOUNTER — Ambulatory Visit (INDEPENDENT_AMBULATORY_CARE_PROVIDER_SITE_OTHER): Payer: Medicare Other | Admitting: Family Medicine

## 2013-03-05 VITALS — BP 118/70 | HR 83 | Temp 97.7°F | Wt 176.4 lb

## 2013-03-05 DIAGNOSIS — Z8614 Personal history of Methicillin resistant Staphylococcus aureus infection: Secondary | ICD-10-CM | POA: Insufficient documentation

## 2013-03-05 DIAGNOSIS — N390 Urinary tract infection, site not specified: Secondary | ICD-10-CM

## 2013-03-05 DIAGNOSIS — E785 Hyperlipidemia, unspecified: Secondary | ICD-10-CM

## 2013-03-05 DIAGNOSIS — Z201 Contact with and (suspected) exposure to tuberculosis: Secondary | ICD-10-CM

## 2013-03-05 DIAGNOSIS — Z22322 Carrier or suspected carrier of Methicillin resistant Staphylococcus aureus: Secondary | ICD-10-CM

## 2013-03-05 DIAGNOSIS — Z111 Encounter for screening for respiratory tuberculosis: Secondary | ICD-10-CM

## 2013-03-05 DIAGNOSIS — R300 Dysuria: Secondary | ICD-10-CM

## 2013-03-05 LAB — LIPID PANEL
Cholesterol: 123 mg/dL (ref 0–200)
LDL Cholesterol: 55 mg/dL (ref 0–99)
Total CHOL/HDL Ratio: 3
VLDL: 21.6 mg/dL (ref 0.0–40.0)

## 2013-03-05 LAB — POCT URINALYSIS DIPSTICK
Nitrite, UA: POSITIVE
Spec Grav, UA: 1.015
Urobilinogen, UA: 0.2
pH, UA: 7

## 2013-03-05 LAB — HEPATIC FUNCTION PANEL
ALT: 14 U/L (ref 0–35)
AST: 19 U/L (ref 0–37)
Bilirubin, Direct: 0.1 mg/dL (ref 0.0–0.3)
Total Bilirubin: 1 mg/dL (ref 0.3–1.2)
Total Protein: 7.4 g/dL (ref 6.0–8.3)

## 2013-03-05 LAB — BASIC METABOLIC PANEL
BUN: 17 mg/dL (ref 6–23)
CO2: 28 mEq/L (ref 19–32)
Chloride: 102 mEq/L (ref 96–112)
Creatinine, Ser: 0.7 mg/dL (ref 0.4–1.2)
Potassium: 3.7 mEq/L (ref 3.5–5.1)

## 2013-03-05 LAB — CBC WITH DIFFERENTIAL/PLATELET
Basophils Absolute: 0 10*3/uL (ref 0.0–0.1)
Basophils Relative: 0.3 % (ref 0.0–3.0)
Eosinophils Relative: 1.2 % (ref 0.0–5.0)
HCT: 42.6 % (ref 36.0–46.0)
Lymphocytes Relative: 15.1 % (ref 12.0–46.0)
Lymphs Abs: 1.1 10*3/uL (ref 0.7–4.0)
MCV: 97.3 fl (ref 78.0–100.0)
Monocytes Relative: 4 % (ref 3.0–12.0)
Neutrophils Relative %: 79.4 % — ABNORMAL HIGH (ref 43.0–77.0)
Platelets: 217 10*3/uL (ref 150.0–400.0)
RBC: 4.38 Mil/uL (ref 3.87–5.11)
WBC: 7.3 10*3/uL (ref 4.5–10.5)

## 2013-03-05 MED ORDER — CIPROFLOXACIN HCL 500 MG PO TABS: 500.0000 mg | ORAL_TABLET | Freq: Two times a day (BID) | ORAL | Status: AC

## 2013-03-05 NOTE — Patient Instructions (Signed)
Urinary Tract Infection  Urinary tract infections (UTIs) can develop anywhere along your urinary tract. Your urinary tract is your body's drainage system for removing wastes and extra water. Your urinary tract includes two kidneys, two ureters, a bladder, and a urethra. Your kidneys are a pair of bean-shaped organs. Each kidney is about the size of your fist. They are located below your ribs, one on each side of your spine.  CAUSES  Infections are caused by microbes, which are microscopic organisms, including fungi, viruses, and bacteria. These organisms are so small that they can only be seen through a microscope. Bacteria are the microbes that most commonly cause UTIs.  SYMPTOMS   Symptoms of UTIs may vary by age and gender of the patient and by the location of the infection. Symptoms in young women typically include a frequent and intense urge to urinate and a painful, burning feeling in the bladder or urethra during urination. Older women and men are more likely to be tired, shaky, and weak and have muscle aches and abdominal pain. A fever may mean the infection is in your kidneys. Other symptoms of a kidney infection include pain in your back or sides below the ribs, nausea, and vomiting.  DIAGNOSIS  To diagnose a UTI, your caregiver will ask you about your symptoms. Your caregiver also will ask to provide a urine sample. The urine sample will be tested for bacteria and white blood cells. White blood cells are made by your body to help fight infection.  TREATMENT   Typically, UTIs can be treated with medication. Because most UTIs are caused by a bacterial infection, they usually can be treated with the use of antibiotics. The choice of antibiotic and length of treatment depend on your symptoms and the type of bacteria causing your infection.  HOME CARE INSTRUCTIONS   If you were prescribed antibiotics, take them exactly as your caregiver instructs you. Finish the medication even if you feel better after you  have only taken some of the medication.   Drink enough water and fluids to keep your urine clear or pale yellow.   Avoid caffeine, tea, and carbonated beverages. They tend to irritate your bladder.   Empty your bladder often. Avoid holding urine for long periods of time.   Empty your bladder before and after sexual intercourse.   After a bowel movement, women should cleanse from front to back. Use each tissue only once.  SEEK MEDICAL CARE IF:    You have back pain.   You develop a fever.   Your symptoms do not begin to resolve within 3 days.  SEEK IMMEDIATE MEDICAL CARE IF:    You have severe back pain or lower abdominal pain.   You develop chills.   You have nausea or vomiting.   You have continued burning or discomfort with urination.  MAKE SURE YOU:    Understand these instructions.   Will watch your condition.   Will get help right away if you are not doing well or get worse.  Document Released: 01/12/2005 Document Revised: 10/04/2011 Document Reviewed: 05/13/2011  Exit[employer] Patient Information 2014 [employer].

## 2013-03-05 NOTE — Assessment & Plan Note (Signed)
Nasal swab done PPD placed and labs drawn at her sons drs request

## 2013-03-05 NOTE — Progress Notes (Signed)
  Subjective:    [name] is a 79 y.o. female who complains of burning with urination and frequency. She has had symptoms for a few days. Patient also complains of none. Patient denies back pain, congestion, cough, fever, headache, rhinitis, sorethroat, stomach ache and vaginal discharge. Patient does have a history of recurrent UTI. Patient does not have a history of pyelonephritis.  Pt also needs labs and nasal swab for MRSA as well as TB test before she will be allowed to see her son who is sick with severe [place] disease in [place].   The following portions of the patient's history were reviewed and updated as appropriate: allergies, current medications, past family history, past medical history, past social history, past surgical history and problem list.  Review of Systems Pertinent items are noted in HPI.    Objective:    BP 118/70  Pulse 83  Temp(Src) 97.7 F (36.5 C) (Oral)  Wt 176 lb 6.4 oz (80.015 kg)  SpO2 99% General appearance: alert, cooperative, appears stated age and no distress Abdomen: soft, non-tender; bowel sounds normal; no masses,  no organomegaly  Laboratory:  Urine dipstick: large for hemoglobin, large for leukocyte esterase and positive for nitrites.   Micro exam: not done.    Assessment:    UTI  --- recurrent   Plan:    Medications: ciprofloxacin. Maintain adequate hydration. Follow up if symptoms not improving, and as needed.  Refer to urology

## 2013-03-05 NOTE — Progress Notes (Signed)
Pre visit review using our clinic review tool, if applicable. No additional management support is needed unless otherwise documented below in the visit note. 

## 2013-03-07 ENCOUNTER — Encounter: Payer: Self-pay | Admitting: *Deleted

## 2013-03-07 LAB — TB SKIN TEST: TB Skin Test: NEGATIVE

## 2013-03-08 LAB — NASAL CULTURE (N/P): Organism ID, Bacteria: NORMAL

## 2013-03-08 LAB — URINE CULTURE

## 2013-03-13 ENCOUNTER — Other Ambulatory Visit (INDEPENDENT_AMBULATORY_CARE_PROVIDER_SITE_OTHER): Payer: Medicare Other

## 2013-03-13 DIAGNOSIS — N390 Urinary tract infection, site not specified: Secondary | ICD-10-CM

## 2013-03-13 LAB — POCT URINALYSIS DIPSTICK
Ketones, UA: NEGATIVE
Protein, UA: NEGATIVE
Spec Grav, UA: 1.025
pH, UA: 6

## 2013-03-15 LAB — URINE CULTURE

## 2013-03-21 ENCOUNTER — Other Ambulatory Visit: Payer: Self-pay

## 2013-03-21 DIAGNOSIS — Z1231 Encounter for screening mammogram for malignant neoplasm of breast: Secondary | ICD-10-CM

## 2013-04-03 ENCOUNTER — Encounter: Payer: Self-pay | Admitting: Emergency Medicine

## 2013-04-03 ENCOUNTER — Ambulatory Visit (INDEPENDENT_AMBULATORY_CARE_PROVIDER_SITE_OTHER): Payer: Medicare Other | Admitting: Emergency Medicine

## 2013-04-03 ENCOUNTER — Ambulatory Visit (INDEPENDENT_AMBULATORY_CARE_PROVIDER_SITE_OTHER)
Admission: RE | Admit: 2013-04-03 | Discharge: 2013-04-03 | Disposition: A | Discharge status: Home / Self Care | Payer: Medicare Other | Source: Ambulatory Visit | Attending: Emergency Medicine | Admitting: Emergency Medicine

## 2013-04-03 VITALS — BP 124/78 | HR 78 | Temp 97.4°F | Ht 65.0 in | Wt 179.0 lb

## 2013-04-03 DIAGNOSIS — R05 Cough: Secondary | ICD-10-CM

## 2013-04-03 DIAGNOSIS — J8409 Other alveolar and parieto-alveolar conditions: Secondary | ICD-10-CM

## 2013-04-03 NOTE — Assessment & Plan Note (Signed)
Seems to correlate with her reflux. Was temporarily better after the Nissen, now reflux has returned.

## 2013-04-03 NOTE — Assessment & Plan Note (Signed)
Appears to be clinically stable. Her cough has returned as above. No indication to repeat CT scan at this time.  - repeat CXR today.

## 2013-04-03 NOTE — Patient Instructions (Signed)
We will continue current medications as prescribed.  CXR today Follow with Dr Delton Coombes in 6 months or sooner if you have any problems

## 2013-04-03 NOTE — Progress Notes (Signed)
Subjective:    Patient ID: [name], female    DOB: Jan 24, 1931, 79y.o.   MRN: [id number] HPI 79 yo woman, never smoker, hx ulcerative colitis, breast CA. Admitted for UTI, A Fib + RVR. During that hospitalization noted to have B infilltrates, was treated empirically for ? pulm edema or PNA w abx. Appearance on film and clinical hx more consistent with COP. Treated with pred 40mg  once daily starting 12/30 w clinical improvement, now follows up.   In retrospect has has dyspnea with exertion evaluated by Dr Laury Axon, Treated beginning in Spring '11 w Advair for ? COPD (never smoked but 2nd hand smoke)  ROV 05/31/10 -- follow up visit for B infiltrates, possible COP. We have been tapering steroids, planning a slightly slower taper for her ulcerative colitis. Breathing is better than last visit, but still some SOB with significant exertion, example shopping all day. Has been taking Advair once daily on most days; has PFT that show mixed disease.   ROV 08/16/10 -- Hx of B infiltrates, probable COP. Has been off pred since March, was doing well off of it. Stopped Advair last visit, did well off of it. She noticed more exertional SOB over the last 2 weeks. Also more allergy sx, with nasal congestion and gtt. Has been treated w abx x 2 since last visit. Some of it may be depression, she is having a hard time living alone with the loss of her husband. Hemoglobin stable on last check by Dr [name]. She injured her R ribs when bending over about 10 days ago. She was having the SOB before the rib injury  ROV 09/09/10 -- follows up for her DOE. She is better than a month ago - rib pain better. Breathing better - still feels a bit run down. She is going to slowly increase her activity. Allergy symptoms are a bit better  ROV 12/12/11 -- hx of dyspnea and infiltrates, felt to be most consistent with COP. CXR in May without infiltrates. She reports today that her cough is bothering her again. She is not on an allergy  regimen anymore. Was tried on Nexium x 2 weeks by Dr Laury Axon without change in her cough. She is having daily clear watery PND.   ROV 01/17/12 -- Follow up for Dyspnea, cough. Last time we started loratadine and astelin >> her cough and drainage improved. She finished the loratadine and then the cough has worsened. She stopped the astelin on her own - difficult to take. Due for CXR given hx COP, wants to defer until next time.   ROV 09/27/12 -- Follow up for Dyspnea, chronic cough. Hx of ulcerative colitis, breast CA. Admitted for UTI, A Fib + RVR, suspected COP in the past. Was seen by Dr Sherene Sires last month with continued cough. She is s/p recent HH and Nissen repair. Was treated with omeprazole, zantac, levaquin. She did not take the prednisone. Returns today reporting improvement in cough.   ROV 04/03/13 -- Dyspnea, chronic cough. Hx of ulcerative colitis, breast CA. Hx A Fib + RVR, suspected COP in the past.  Has had recurrent UTI's. Her cough resolved after her Nissen, then cough returned. Most recent UGI shows that she is still refluxing.   Review of Systems As above    Objective:   Physical Exam Filed Vitals:   04/03/13 1442  BP: 124/78  Pulse: 78  Temp: 97.4 F (36.3 C)  TempSrc: Oral  Height: 5\' 5"  (1.651 m)  Weight: 179 lb (81.194 kg)  SpO2: 97%   Gen: Pleasant, well-nourished, in no distress,  normal affect  ENT: No lesions,  mouth clear,  oropharynx clear, no postnasal drip  Neck: No JVD, no TMG, no carotid bruits  Lungs: No use of accessory muscles, subtle insp squeek at LUL > R apex  Cardiovascular: RRR, heart sounds normal, soft systolic M, no peripheral edema  Musculoskeletal: No deformities, no cyanosis or clubbing  Neuro: alert, non focal  Skin: Warm, no lesions or rashes   Assessment & Plan:  Cough Seems to correlate with her reflux. Was temporarily better after the Nissen, now reflux has returned.   OTH SPEC ALVEOL&PARIETOALVEOL PNEUMONOPATHIES Appears to be  clinically stable. Her cough has returned as above. No indication to repeat CT scan at this time.  - repeat CXR today.

## 2013-04-24 ENCOUNTER — Other Ambulatory Visit: Payer: Self-pay | Admitting: Family Medicine

## 2013-04-25 ENCOUNTER — Ambulatory Visit: Payer: Medicare Other

## 2013-04-25 NOTE — Telephone Encounter (Signed)
Last seen 03/05/13 and filled 02/06/13 #60.  UDS 12/12/12- Low Risk  Please advise      KP

## 2013-04-30 ENCOUNTER — Telehealth (INDEPENDENT_AMBULATORY_CARE_PROVIDER_SITE_OTHER): Payer: Self-pay

## 2013-04-30 NOTE — Telephone Encounter (Signed)
Contacted patient and let her know that Dr. Hassell Done would be happy to see her tomorrow afternoon instead of tomorrow morning.  Her appt was cancelled because his morning office was cancelled.  The patient could not come tomorrow, but was rescheduled to 06/12/13 at 9:00a.m.  I apologized to the patient for any inconvenience this has caused her.

## 2013-05-01 ENCOUNTER — Ambulatory Visit (INDEPENDENT_AMBULATORY_CARE_PROVIDER_SITE_OTHER): Payer: Medicare Other | Admitting: Surgery

## 2013-05-01 ENCOUNTER — Encounter (INDEPENDENT_AMBULATORY_CARE_PROVIDER_SITE_OTHER): Payer: Medicare Other | Admitting: Surgery

## 2013-05-08 ENCOUNTER — Ambulatory Visit (INDEPENDENT_AMBULATORY_CARE_PROVIDER_SITE_OTHER): Payer: Medicare Other | Admitting: Surgery

## 2013-05-08 ENCOUNTER — Encounter (INDEPENDENT_AMBULATORY_CARE_PROVIDER_SITE_OTHER): Payer: Self-pay

## 2013-05-08 ENCOUNTER — Encounter (INDEPENDENT_AMBULATORY_CARE_PROVIDER_SITE_OTHER): Payer: Self-pay | Admitting: Surgery

## 2013-05-08 VITALS — BP 126/82 | HR 68 | Temp 97.6°F | Resp 24 | Ht 65.0 in | Wt 180.2 lb

## 2013-05-08 DIAGNOSIS — K449 Diaphragmatic hernia without obstruction or gangrene: Secondary | ICD-10-CM

## 2013-05-08 NOTE — Progress Notes (Signed)
[name] 80 y.o.  Body mass index is 29.99 kg/(m^2).  Patient Active Problem List   Diagnosis Date Noted  . Hx MRSA infection 03/05/2013  . Lap Repair Large Hiatal Hernia with Nissen May 2014 09/20/2012  . Cough 09/13/2012  . Large type III mixed hiatus hernia 08/16/2012  . Urinary frequency 12/09/2011  . Carotid arterial disease 10/31/2011  . UTI 05/14/2010  . HYPERGLYCEMIA 05/14/2010  . OTH SPEC ALVEOL&PARIETOALVEOL PNEUMONOPATHIES 04/28/2010  . HEMORRHOIDS-INTERNAL 03/01/2010  . GASTRIC OUTLET OBSTRUCTION 02/10/2010  . RHEUMATIC HEART DISEASE 02/02/2010  . ESOPHAGEAL STRICTURE 02/02/2010  . GERD 02/02/2010  . DUODENAL ULCER 02/02/2010  . HIATAL HERNIA 02/02/2010  . DIVERTICULOSIS, COLON 02/02/2010  . OSTEOARTHRITIS 02/02/2010  . ASYMPTOMATIC POSTMENOPAUSAL STATUS 06/09/2008  . DECREASED HEARING, BILATERAL 02/06/2008  . PEPTIC ULCER DISEASE 06/07/2007  . HYPERLIPIDEMIA 12/19/2006  . ANXIETY 12/19/2006  . Anxiety and depression 12/19/2006  . COPD 12/19/2006  . GLAUCOMA 09/04/2006  . HYPERTENSION 09/04/2006  . OSTEOPOROSIS 09/04/2006  . Personal History of Other Diseases of Circulatory System 09/04/2006  . TOTAL HYSTERECTOMY AND BILATERAL SALPINGOOPHERECTOMY, HX OF 09/04/2006  . TONSILLECTOMY AND ADENOIDECTOMY, HX OF 09/04/2006  . PREMATURE VENTRICULAR CONTRACTIONS 10/06/1994  . MASTECTOMY, RIGHT, HX OF 04/18/1993    Allergies  Allergen Reactions  . Demerol [Meperidine] Swelling    Throat swelling   . Meperidine Hcl Swelling    REACTION: THROAT SWELLING  . Morphine     REACTION: SWELLING  . Nitroglycerin     Very low bp    Past Surgical History  Procedure Laterality Date  . Cataract extraction  08/2006 and 10/2006  . Total knee arthroplasty      left  . Hemorrhoid surgery    . Tonsillectomy    . Abdominal hysterectomy    . Joint replacement      right  . Mastectomy  1995    right  . Breast surgery      right  . Breast reconstruction  2000    right   . Laparoscopic nissen fundoplication N/A 4/69/6295    Procedure: LAP REPAIR OF LARGE TYPE III MIXED HIATUS HERNIA WITH NISSEN FUNOPLICATION;  Surgeon: Pedro Earls, MD;  Location: [facility];  Service: General;  Laterality: N/A;  . Hiatal hernia repair N/A 09/03/2012    Procedure: LAPAROSCOPIC REPAIR OF HIATAL HERNIA;  Surgeon: Pedro Earls, MD;  Location: [facility];  Service: General;  Laterality: N/A;   [name], DO No diagnosis found.  [name] presented to the front desk to be seen today. I found out last week that she was never given a call back for followup after her upper GI done back in October 2014. She was understandably angry and talk to her and explained apologized for our office. I went ahead and reviewed all of the upper GI series with her and showed her the difference in her anatomy beginning with a most of her stomach being up and her chest until most recent one back in October. By comparison her stomach is below the diaphragm. She does have a Fritz Pickerel of stricture that I think is likely causing the dysphagia that she has. She does not have nocturnal coughing some not sure that her cough during the day he isn't related to her nerves.  I will see her back in 3 months and see how she is doing. I counseled her on trying to chew her food well and watch what she is particularly things like cold liquids and breads  which can aggravate her dysphagia. Matt B. Hassell Done, MD, [facility] [phone] beeper [phone]  05/08/2013 10:48 AM

## 2013-06-10 ENCOUNTER — Other Ambulatory Visit: Payer: Self-pay | Admitting: Family Medicine

## 2013-06-10 NOTE — Telephone Encounter (Signed)
Last seen 03/05/13 and filled 04/24/13 #60. Please advise     KP

## 2013-06-12 ENCOUNTER — Encounter (INDEPENDENT_AMBULATORY_CARE_PROVIDER_SITE_OTHER): Payer: Medicare Other | Admitting: Surgery

## 2013-06-17 ENCOUNTER — Telehealth: Payer: Self-pay | Admitting: Emergency Medicine

## 2013-06-17 MED ORDER — BENZONATATE 100 MG PO CAPS: 100.0000 mg | ORAL_CAPSULE | Freq: Four times a day (QID) | ORAL | Status: DC | PRN

## 2013-06-17 NOTE — Telephone Encounter (Signed)
Benzonatate 100 mg q6h prn cough rx sent to [facility]  Pt is aware of this and is aware of the different strength and instructions from the current rx she has at home.  She verbalized understanding and was very appreciative of this.  She is to call back if symptoms do not improve or worsen.

## 2013-06-17 NOTE — Telephone Encounter (Signed)
Yes please order 100mg  perles q6h prn cough, #30, 1RF.  Thanks

## 2013-06-17 NOTE — Telephone Encounter (Signed)
Called, spoke with pt.  Pt states she has been coughing a lot since 08-14-22.  Her son passed on 08/14/22, so she has been talking more than normal.  Cough is nonprod.  No increased SOB, wheezing, chest tightness, or f/c/s.  She does have some soreness in chest from coughing.  Has tried cough drops and delsym with no relief.  Had old rx for benzonatate 200 mg tid prn on hand that was given by PCP.  She tried this last night, and it helped.  She is requesting rx for this - [facility] [address].  Her son's funeral is Wednesday; she doesn't want to be coughing as much as she is now if possible.  Dr. Lamonte Sakai, pls advise if rx is ok -- it is not on pt's current med list.  Thank you.

## 2013-07-11 ENCOUNTER — Encounter (INDEPENDENT_AMBULATORY_CARE_PROVIDER_SITE_OTHER): Payer: Self-pay | Admitting: Surgery

## 2013-07-11 ENCOUNTER — Ambulatory Visit (INDEPENDENT_AMBULATORY_CARE_PROVIDER_SITE_OTHER): Payer: Medicare Other | Admitting: Surgery

## 2013-07-11 VITALS — BP 138/70 | HR 78 | Resp 16 | Ht 65.0 in | Wt 179.2 lb

## 2013-07-11 DIAGNOSIS — Z09 Encounter for follow-up examination after completed treatment for conditions other than malignant neoplasm: Secondary | ICD-10-CM

## 2013-07-11 DIAGNOSIS — Z9889 Other specified postprocedural states: Secondary | ICD-10-CM

## 2013-07-11 NOTE — Progress Notes (Signed)
[name] 80 y.o.  Body mass index is 29.82 kg/(m^2).  Patient Active Problem List   Diagnosis Date Noted  . Hx MRSA infection 03/05/2013  . Lap Repair Large Hiatal Hernia with Nissen May 2014 09/20/2012  . Cough 09/13/2012  . Large type III mixed hiatus hernia 08/16/2012  . Urinary frequency 12/09/2011  . Carotid arterial disease 10/31/2011  . UTI 05/14/2010  . HYPERGLYCEMIA 05/14/2010  . OTH SPEC ALVEOL&PARIETOALVEOL PNEUMONOPATHIES 04/28/2010  . HEMORRHOIDS-INTERNAL 03/01/2010  . GASTRIC OUTLET OBSTRUCTION 02/10/2010  . RHEUMATIC HEART DISEASE 02/02/2010  . ESOPHAGEAL STRICTURE 02/02/2010  . GERD 02/02/2010  . DUODENAL ULCER 02/02/2010  . HIATAL HERNIA 02/02/2010  . DIVERTICULOSIS, COLON 02/02/2010  . OSTEOARTHRITIS 02/02/2010  . ASYMPTOMATIC POSTMENOPAUSAL STATUS 06/09/2008  . DECREASED HEARING, BILATERAL 02/06/2008  . PEPTIC ULCER DISEASE 06/07/2007  . HYPERLIPIDEMIA 12/19/2006  . ANXIETY 12/19/2006  . Anxiety and depression 12/19/2006  . COPD 12/19/2006  . GLAUCOMA 09/04/2006  . HYPERTENSION 09/04/2006  . OSTEOPOROSIS 09/04/2006  . Personal History of Other Diseases of Circulatory System 09/04/2006  . TOTAL HYSTERECTOMY AND BILATERAL SALPINGOOPHERECTOMY, HX OF 09/04/2006  . TONSILLECTOMY AND ADENOIDECTOMY, HX OF 09/04/2006  . PREMATURE VENTRICULAR CONTRACTIONS 10/06/1994  . MASTECTOMY, RIGHT, HX OF 04/18/1993    Allergies  Allergen Reactions  . Demerol [Meperidine] Swelling    Throat swelling   . Meperidine Hcl Swelling    REACTION: THROAT SWELLING  . Morphine     REACTION: SWELLING  . Nitroglycerin     Very low bp    Past Surgical History  Procedure Laterality Date  . Cataract extraction  08/2006 and 10/2006  . Total knee arthroplasty      left  . Hemorrhoid surgery    . Tonsillectomy    . Abdominal hysterectomy    . Joint replacement      right  . Mastectomy  1995    right  . Breast surgery      right  . Breast reconstruction  2000    right   . Laparoscopic nissen fundoplication N/A 1/61/0960    Procedure: LAP REPAIR OF LARGE TYPE III MIXED HIATUS HERNIA WITH NISSEN FUNOPLICATION;  Surgeon: Pedro Earls, MD;  Location: [facility];  Service: General;  Laterality: N/A;  . Hiatal hernia repair N/A 09/03/2012    Procedure: LAPAROSCOPIC REPAIR OF HIATAL HERNIA;  Surgeon: Pedro Earls, MD;  Location: [facility];  Service: General;  Laterality: N/A;   [name], DO No diagnosis found.  [name] son "[name]" just died of liver failure.  I listened to her.  She has had a chronic intermittent cough.  I doubt this is reflux but probably related to anxiety.  Will see back in June which will be about 1 year out from surgery.  Matt B. Hassell Done, MD, [facility] [phone] beeper [phone]  07/11/2013 10:04 AM

## 2013-08-14 ENCOUNTER — Other Ambulatory Visit: Payer: Self-pay | Admitting: Family Medicine

## 2013-08-15 NOTE — Telephone Encounter (Signed)
Last seen 03/05/13 and filled 06/10/12 #60. Please advise     KP

## 2013-09-04 ENCOUNTER — Ambulatory Visit (INDEPENDENT_AMBULATORY_CARE_PROVIDER_SITE_OTHER)
Admission: RE | Admit: 2013-09-04 | Discharge: 2013-09-04 | Disposition: A | Discharge status: Home / Self Care | Payer: Medicare Other | Source: Ambulatory Visit | Attending: Emergency Medicine | Admitting: Emergency Medicine

## 2013-09-04 ENCOUNTER — Encounter: Payer: Self-pay | Admitting: Emergency Medicine

## 2013-09-04 ENCOUNTER — Ambulatory Visit (INDEPENDENT_AMBULATORY_CARE_PROVIDER_SITE_OTHER): Payer: Medicare Other | Admitting: Emergency Medicine

## 2013-09-04 VITALS — BP 128/84 | HR 76 | Ht 65.5 in | Wt 183.0 lb

## 2013-09-04 DIAGNOSIS — R059 Cough, unspecified: Secondary | ICD-10-CM

## 2013-09-04 DIAGNOSIS — R05 Cough: Secondary | ICD-10-CM

## 2013-09-04 MED ORDER — ESOMEPRAZOLE MAGNESIUM 40 MG PO PACK: 40.0000 mg | PACK | Freq: Two times a day (BID) | ORAL | Status: DC

## 2013-09-04 MED ORDER — HYDROCOD POLST-CHLORPHEN POLST 10-8 MG/5ML PO LQCR: 5.0000 mL | Freq: Two times a day (BID) | ORAL | Status: DC | PRN

## 2013-09-04 MED ORDER — BENZONATATE 200 MG PO CAPS: 200.0000 mg | ORAL_CAPSULE | Freq: Three times a day (TID) | ORAL | Status: DC | PRN

## 2013-09-04 NOTE — Patient Instructions (Addendum)
Start Nexium 40mg  twice a day Continue your pepcid for now Use tussionex 5cc up to every 12 hours if needed for your cough Take tessalon perles 1 up to every 6 hours if needed for cough.  CXR today Follow with Dr Lamonte Sakai in 2 months or sooner if you have any problems.

## 2013-09-04 NOTE — Progress Notes (Signed)
Subjective:    Patient ID: [name], female    DOB: 1931-01-14, 80y.o.   MRN: [id number] HPI 80 yo woman, never smoker, hx ulcerative colitis, breast CA. Admitted for UTI, A Fib + RVR. During that hospitalization noted to have B infilltrates, was treated empirically for ? pulm edema or PNA w abx. Appearance on film and clinical hx more consistent with COP. Treated with pred 40mg  once daily starting 12/30 w clinical improvement, now follows up.   In retrospect has has dyspnea with exertion evaluated by Dr Etter Sjogren, Treated beginning in Spring '11 w Advair for ? COPD (never smoked but 2nd hand smoke)  ROV 05/31/10 -- follow up visit for B infiltrates, possible COP. We have been tapering steroids, planning a slightly slower taper for her ulcerative colitis. Breathing is better than last visit, but still some SOB with significant exertion, example shopping all day. Has been taking Advair once daily on most days; has PFT that show mixed disease.   ROV 08/16/10 -- Hx of B infiltrates, probable COP. Has been off pred since March, was doing well off of it. Stopped Advair last visit, did well off of it. She noticed more exertional SOB over the last 2 weeks. Also more allergy sx, with nasal congestion and gtt. Has been treated w abx x 2 since last visit. Some of it may be depression, she is having a hard time living alone with the loss of her husband. Hemoglobin stable on last check by Dr Olevia Perches. She injured her R ribs when bending over about 10 days ago. She was having the SOB before the rib injury  ROV 09/09/10 -- follows up for her DOE. She is better than a month ago - rib pain better. Breathing better - still feels a bit run down. She is going to slowly increase her activity. Allergy symptoms are a bit better  ROV 12/12/11 -- hx of dyspnea and infiltrates, felt to be most consistent with COP. CXR in May without infiltrates. She reports today that her cough is bothering her again. She is not on an allergy  regimen anymore. Was tried on Nexium x 2 weeks by Dr Etter Sjogren without change in her cough. She is having daily clear watery PND.   ROV 01/17/12 -- Follow up for Dyspnea, cough. Last time we started loratadine and astelin >> her cough and drainage improved. She finished the loratadine and then the cough has worsened. She stopped the astelin on her own - difficult to take. Due for CXR given hx COP, wants to defer until next time.   ROV 09/27/12 -- Follow up for Dyspnea, chronic cough. Hx of ulcerative colitis, breast CA. Admitted for UTI, A Fib + RVR, suspected COP in the past. Was seen by Dr Melvyn Novas last month with continued cough. She is s/p recent [place] and Nissen repair. Was treated with omeprazole, zantac, levaquin. She did not take the prednisone. Returns today reporting improvement in cough.   ROV 04/03/13 -- Dyspnea, chronic cough. Hx of ulcerative colitis, breast CA. Hx A Fib + RVR, suspected COP in the past.  Has had recurrent UTI's. Her cough resolved after her Nissen, then cough returned. Most recent UGI shows that she is still refluxing.   ROV 5/201/5 -- follows for chronic cough, hx possible COP.  Has corresponded with her reflux. She was better at first after her Nissen. She uses pepcid. Does a reflux diet and has head of her bed up. She feels reflux frequently in her mouth and throat.  Coughs at night.   Review of Systems As above    Objective:   Physical Exam Filed Vitals:   09/04/13 1421  BP: 128/84  Pulse: 76  Height: 5' 5.5" (1.664 m)  Weight: 183 lb (83.008 kg)  SpO2: 97%   Gen: Pleasant, well-nourished, in no distress,  normal affect  ENT: No lesions,  mouth clear,  oropharynx clear, no postnasal drip  Neck: No JVD, no TMG, no carotid bruits  Lungs: No use of accessory muscles, subtle insp squeek at LUL > R apex  Cardiovascular: RRR, heart sounds normal, soft systolic M, no peripheral edema  Musculoskeletal: No deformities, no cyanosis or clubbing  Neuro: alert, non  focal  Skin: Warm, no lesions or rashes   Assessment & Plan:  No problem-specific assessment & plan notes found for this encounter.

## 2013-09-04 NOTE — Assessment & Plan Note (Signed)
She describes classic GERD sx despite her Nissen, ? Leaking.  - will start nexium bid in addition to her pepcid - cough suppression ordered - ? Whether she needs to talk to to [place] about the Nissen; she doesn't want an EGD or pH probe - CXR today to r/o other causes; she has a hx possible COP

## 2013-09-04 NOTE — Addendum Note (Signed)
Addended by: Len Blalock on: 09/04/2013 03:12 PM   Modules accepted: Orders

## 2013-09-19 ENCOUNTER — Ambulatory Visit (INDEPENDENT_AMBULATORY_CARE_PROVIDER_SITE_OTHER): Payer: Medicare Other | Admitting: Surgery

## 2013-10-15 ENCOUNTER — Other Ambulatory Visit: Payer: Self-pay | Admitting: Family Medicine

## 2013-10-15 NOTE — Telephone Encounter (Signed)
Last seen 03/05/13 and filled 08/15/13 #60. Pease advise      KP

## 2013-10-31 ENCOUNTER — Ambulatory Visit: Payer: Medicare Other | Admitting: Emergency Medicine

## 2013-11-06 ENCOUNTER — Emergency Department (HOSPITAL_COMMUNITY)
Admission: EM | Admit: 2013-11-06 | Discharge: 2013-11-06 | Disposition: A | Discharge status: Home / Self Care | Payer: Medicare Other | Attending: Emergency Medicine | Admitting: Emergency Medicine

## 2013-11-06 ENCOUNTER — Encounter (HOSPITAL_COMMUNITY): Payer: Self-pay | Admitting: Emergency Medicine

## 2013-11-06 ENCOUNTER — Emergency Department (HOSPITAL_COMMUNITY): Payer: Medicare Other

## 2013-11-06 DIAGNOSIS — J449 Chronic obstructive pulmonary disease, unspecified: Secondary | ICD-10-CM | POA: Diagnosis not present

## 2013-11-06 DIAGNOSIS — I10 Essential (primary) hypertension: Secondary | ICD-10-CM | POA: Insufficient documentation

## 2013-11-06 DIAGNOSIS — W010XXA Fall on same level from slipping, tripping and stumbling without subsequent striking against object, initial encounter: Secondary | ICD-10-CM | POA: Insufficient documentation

## 2013-11-06 DIAGNOSIS — Z7982 Long term (current) use of aspirin: Secondary | ICD-10-CM | POA: Diagnosis not present

## 2013-11-06 DIAGNOSIS — F411 Generalized anxiety disorder: Secondary | ICD-10-CM | POA: Insufficient documentation

## 2013-11-06 DIAGNOSIS — Y9301 Activity, walking, marching and hiking: Secondary | ICD-10-CM | POA: Insufficient documentation

## 2013-11-06 DIAGNOSIS — F329 Major depressive disorder, single episode, unspecified: Secondary | ICD-10-CM | POA: Diagnosis not present

## 2013-11-06 DIAGNOSIS — Z8669 Personal history of other diseases of the nervous system and sense organs: Secondary | ICD-10-CM | POA: Diagnosis not present

## 2013-11-06 DIAGNOSIS — K219 Gastro-esophageal reflux disease without esophagitis: Secondary | ICD-10-CM | POA: Diagnosis not present

## 2013-11-06 DIAGNOSIS — S59919A Unspecified injury of unspecified forearm, initial encounter: Secondary | ICD-10-CM

## 2013-11-06 DIAGNOSIS — J4489 Other specified chronic obstructive pulmonary disease: Secondary | ICD-10-CM | POA: Insufficient documentation

## 2013-11-06 DIAGNOSIS — M1990 Unspecified osteoarthritis, unspecified site: Secondary | ICD-10-CM | POA: Insufficient documentation

## 2013-11-06 DIAGNOSIS — S52609A Unspecified fracture of lower end of unspecified ulna, initial encounter for closed fracture: Principal | ICD-10-CM

## 2013-11-06 DIAGNOSIS — S62101A Fracture of unspecified carpal bone, right wrist, initial encounter for closed fracture: Secondary | ICD-10-CM

## 2013-11-06 DIAGNOSIS — F3289 Other specified depressive episodes: Secondary | ICD-10-CM | POA: Diagnosis not present

## 2013-11-06 DIAGNOSIS — Z853 Personal history of malignant neoplasm of breast: Secondary | ICD-10-CM | POA: Diagnosis not present

## 2013-11-06 DIAGNOSIS — S59909A Unspecified injury of unspecified elbow, initial encounter: Secondary | ICD-10-CM | POA: Insufficient documentation

## 2013-11-06 DIAGNOSIS — S6990XA Unspecified injury of unspecified wrist, hand and finger(s), initial encounter: Secondary | ICD-10-CM

## 2013-11-06 DIAGNOSIS — Y9289 Other specified places as the place of occurrence of the external cause: Secondary | ICD-10-CM | POA: Diagnosis not present

## 2013-11-06 DIAGNOSIS — Z862 Personal history of diseases of the blood and blood-forming organs and certain disorders involving the immune mechanism: Secondary | ICD-10-CM | POA: Insufficient documentation

## 2013-11-06 DIAGNOSIS — Z79899 Other long term (current) drug therapy: Secondary | ICD-10-CM | POA: Insufficient documentation

## 2013-11-06 DIAGNOSIS — S52509A Unspecified fracture of the lower end of unspecified radius, initial encounter for closed fracture: Secondary | ICD-10-CM | POA: Insufficient documentation

## 2013-11-06 DIAGNOSIS — Z8711 Personal history of peptic ulcer disease: Secondary | ICD-10-CM | POA: Diagnosis not present

## 2013-11-06 DIAGNOSIS — Z8639 Personal history of other endocrine, nutritional and metabolic disease: Secondary | ICD-10-CM | POA: Insufficient documentation

## 2013-11-06 MED ORDER — OXYCODONE-ACETAMINOPHEN 5-325 MG PO TABS
1.0000 | ORAL_TABLET | Freq: Once | ORAL | Status: AC
  Administered 2013-11-06: 1 via ORAL
  Filled 2013-11-06: qty 1

## 2013-11-06 MED ORDER — OXYCODONE-ACETAMINOPHEN 5-325 MG PO TABS: 1.0000 | ORAL_TABLET | Freq: Four times a day (QID) | ORAL | Status: DC | PRN

## 2013-11-06 NOTE — ED Provider Notes (Signed)
CSN: [id number]     Arrival date & time 11/06/13  1131 History   First MD Initiated Contact with Patient 11/06/13 1139     Chief Complaint  Patient presents with  . Wrist Injury   HPI The patient presents to the emergency room with complaints of a wrist injury.  The patient fell wall walking outside. There was a slight incline and she tripped and stumbled. The patient fell onto her outstretched left hand. The patient now has pain and swelling of her left wrist.  She denies any other injuries. She did hit her head but did not lose consciousness. Patient was given fentanyl intranasally by EMS. She has been able to walk without difficulty Past Medical History  Diagnosis Date  . Hypertension   . Osteoporosis   . COPD (chronic obstructive pulmonary disease)   . Hyperlipidemia   . Anxiety   . Depression   . Breast cancer 13 years ago    s/p tamoxifen   . Peptic ulcer disease   . Hiatal hernia   . Ulcerative colitis   . GERD (gastroesophageal reflux disease)   . Hiatal hernia   . Osteoarthritis   . Diverticulosis   . Esophageal stricture   . Internal hemorrhoids   . Gastric outlet obstruction   . Rheumatic heart disease   . Glaucoma    Past Surgical History  Procedure Laterality Date  . Cataract extraction  08/2006 and 10/2006  . Total knee arthroplasty      left  . Hemorrhoid surgery    . Tonsillectomy    . Abdominal hysterectomy    . Joint replacement      right  . Mastectomy  1995    right  . Breast surgery      right  . Breast reconstruction  2000    right  . Laparoscopic nissen fundoplication N/A 0/34/7425    Procedure: LAP REPAIR OF LARGE TYPE III MIXED HIATUS HERNIA WITH NISSEN FUNOPLICATION;  Surgeon: Pedro Earls, MD;  Location: [facility];  Service: General;  Laterality: N/A;  . Hiatal hernia repair N/A 09/03/2012    Procedure: LAPAROSCOPIC REPAIR OF HIATAL HERNIA;  Surgeon: Pedro Earls, MD;  Location: [facility];  Service: General;  Laterality: N/A;   Family  History  Problem Relation Age of Onset  . Coronary artery disease Sister     x 2 in 55's  . Coronary artery disease Brother     x 2 in 70's  . Diabetes    . Colon cancer Father 59  . Hypertension    . Stroke Mother 88  . Crohn's disease Brother   . Breast cancer      Aunt  . Cirrhosis Brother    History  Substance Use Topics  . Smoking status: Never Smoker   . Smokeless tobacco: Never Used  . Alcohol Use: No   OB History   Grav Para Term Preterm Abortions TAB SAB Ect Mult Living                 Review of Systems  All other systems reviewed and are negative.     Allergies  Demerol; Meperidine hcl; Morphine; and Nitroglycerin  Home Medications   Prior to Admission medications   Medication Sig Start Date End Date Taking? Authorizing Provider  aspirin 81 MG tablet Take 81 mg by mouth daily.     Yes Historical Provider, MD  azelastine (ASTELIN) 137 MCG/SPRAY nasal spray Place 1 spray into the nose 2 (two)  times daily as needed (for allergies). Use in each nostril as directed   Yes Historical Provider, MD  benzonatate (TESSALON) 200 MG capsule Take 1 capsule (200 mg total) by mouth 3 (three) times daily as needed for cough. 09/04/13  Yes Collene Gobble, MD  Biotin (BIOTIN ULTRA STRENGTH) 10 MG CAPS Take 1 capsule by mouth daily.   Yes Historical Provider, MD  escitalopram (LEXAPRO) 20 MG tablet Take 20 mg by mouth daily.   Yes Historical Provider, MD  Iron 66 MG TABS Take 65 mg by mouth daily.    Yes Historical Provider, MD  LORazepam (ATIVAN) 2 MG tablet Take 2 mg by mouth every 8 (eight) hours as needed for anxiety.   Yes Historical Provider, MD  OVER THE COUNTER MEDICATION Take 1 tablet by mouth daily as needed (stomach acid reduction/reflux.).   Yes Historical Provider, MD  PATADAY 0.2 % SOLN Place 1 drop into both eyes daily as needed (eye allergies.).  06/27/11  Yes Historical Provider, MD  polyethylene glycol (MIRALAX / GLYCOLAX) packet Take 17 g by mouth daily.   Yes  Historical Provider, MD  Respiratory Therapy Supplies (FLUTTER) DEVI Use as directed 09/12/12  Yes Tanda Rockers, MD  RESTASIS 0.05 % ophthalmic emulsion Place 1 drop into both eyes at bedtime.  12/31/10  Yes Historical Provider, MD  telmisartan-hydrochlorothiazide (MICARDIS HCT) 40-12.5 MG per tablet Take 1 tablet by mouth daily.   Yes Historical Provider, MD  vitamin B-12 (CYANOCOBALAMIN) 1000 MCG tablet Take 1,000 mcg by mouth daily.     Yes Historical Provider, MD  oxyCODONE-acetaminophen (PERCOCET/ROXICET) 5-325 MG per tablet Take 1-2 tablets by mouth every 6 (six) hours as needed for severe pain. 11/06/13   Dorie Rank, MD   BP 134/60  Pulse 80  Temp(Src) 98.8 F (37.1 C) (Oral)  Resp 18  SpO2 97% Physical Exam  Nursing note and vitals reviewed. Constitutional: She appears well-developed and well-nourished. No distress.  HENT:  Head: Normocephalic and atraumatic.  Right Ear: External ear normal.  Left Ear: External ear normal.  Eyes: Conjunctivae are normal. Right eye exhibits no discharge. Left eye exhibits no discharge. No scleral icterus.  Neck: Neck supple. No tracheal deviation present.  Cardiovascular: Normal rate.   Pulmonary/Chest: Effort normal. No stridor. No respiratory distress.  Musculoskeletal: She exhibits no edema.       Left wrist: She exhibits decreased range of motion, tenderness, bony tenderness and swelling. She exhibits no effusion.  Distal neurovascular intact, patient able to move her digits, normal capillary refill  Neurological: She is alert. Cranial nerve deficit: no gross deficits.  Skin: Skin is warm and dry. No rash noted.  Psychiatric: She has a normal mood and affect.    ED Course  Procedures (including critical care time) Labs Review Labs Reviewed - No data to display  Imaging Review Dg Forearm Right  11/06/2013   CLINICAL DATA:  Status post fall with wrist and forearm deformity  EXAM: RIGHT FOREARM - 2 VIEW  COMPARISON:  Left wrist series of  today's date  FINDINGS: The shafts of the radius and ulna are intact. There is an impacted fracture of the distal radial metaphysis. An ulnar styloid fracture is also present. Proximally the radial head and olecranon are intact.  IMPRESSION: There are acute fractures of the distal radial metaphysis and ulnar styloid.   Electronically Signed   By: David  Martinique   On: 11/06/2013 12:25   Dg Wrist Complete Right  11/06/2013   CLINICAL DATA:  Wrist deformity and pain status post fall  EXAM: RIGHT WRIST - COMPLETE 3+ VIEW  COMPARISON:  Forearm film of today's date.  FINDINGS: There is a comminuted impacted fracture of the distal left radial metaphysis. There is mild angulation with the apex volar. There is a tiny ulnar styloid fracture. The carpal bones and metacarpals are intact. There is diffuse osteopenia.  IMPRESSION: There are acute fractures of the metaphysis of the distal radius and of the ulnar styloid.   Electronically Signed   By: David  Martinique   On: 11/06/2013 12:26    Note:  Pt actually had her left forearm and wrist xrays done, not right  MDM   Final diagnoses:  Wrist fracture, closed, right, initial encounter    Pt has a vacation scheduled this Friday.  Discussed with Dr Amedeo Plenty.  Will splint patient.  He will see her in the office today.      Dorie Rank, MD 11/06/13 [phone]

## 2013-11-06 NOTE — ED Notes (Addendum)
Per EMS: Pt was outside and turned her ankle wrong and fell with her left wrist extended. Pt reports hitting her head. EMS reports a deformity to the left wrist, however denies head injury or LOC. EMS reports good circulation, movement, and sensation to the distal hand. Pt was given 50 mcgs of fentanyl intranasally. Pt was ambulatory on scene and denied neck and back pain. Pt has restricted access to the right arm. Splint and ice has been applied in route.

## 2013-11-06 NOTE — Discharge Instructions (Signed)
Wrist Fracture °A wrist fracture is a break in one of the bones of the wrist. Your wrist is made up of several small bones at the palm of your hand (carpal bones) and the two bones that make up your forearm (radius and ulna). The bones come together to form multiple large and small joints. The shape and design of these joints allow your wrist to bend and straighten, move side-to-side, and rotate, as in twisting your palm up or down. °CAUSES  °A fracture may occur in any of the bones in your wrist when enough force is applied to the wrist, such as when falling down onto an outstretched hand. Severe injuries may occur from a more forceful injury. °SYMPTOMS °Symptoms of wrist fractures include tenderness, bruising, and swelling. Additionally, the wrist may hang in an odd position or may be misshaped. °DIAGNOSIS °To diagnose a wrist fracture, your caregiver will physically examine your wrist. Your caregiver may also request an X-ray exam of your wrist. °TREATMENT °Treatment depends on many factors, including the nature and location of the fracture, your age, and your activity level. Treatment for wrist fracture can be nonsurgical or surgical. °For nonsurgical treatment, a plaster cast or splint may be applied to your wrist if the bone is in a good position (aligned). The cast will stay on for about 6 weeks. If the alignment of your bone is not good, it may be necessary to realign (reduce) it. After the bone is reduced, a splint usually is placed on your wrist to allow for a small amount of normal swelling. After about 1 week, the splint is removed and a cast is added. The cast is removed 2 or 3 weeks later, after the swelling goes down, causing the cast to loosen. Another cast is applied. This cast is removed after about another 2 or 3 weeks, for a total of 4 to 6 weeks of immobilization. °Sometimes the position of the bone is so far out of place that surgery is required to apply a device to hold it together as it  heals. If the bone cannot be reduced without cutting the skin around the bone (closed reduction), a cut (incision) is made to allow direct access to the bone to reduce it (open reduction). Depending on the fracture, there are a number of options for holding the bone in place while it heals, including a cast, metal pins, a plate and screws, and a device called an external fixator. With an external fixator, most of the hardware remains outside of the body. °HOME CARE INSTRUCTIONS °· To lessen swelling, keep your injured wrist elevated and move your fingers as much as possible. °· Apply ice to your wrist for the first 1 to 2 days after you have been treated or as directed by your caregiver. Applying ice helps to reduce inflammation and pain. °¨ Put ice in a plastic bag. °¨ Place a towel between your skin and the bag. °¨ Leave the ice on for 15 to 20 minutes at a time, every 2 hours while you are awake. °· Do not put pressure on any part of your cast or splint. It may break. °· Use a plastic bag to protect your cast or splint from water while bathing or showering. Do not lower your cast or splint into water. °· Only take over-the-counter or prescription medicines for pain as directed by your caregiver. °SEEK IMMEDIATE MEDICAL CARE IF:  °· Your cast or splint gets damaged or breaks. °· You have continued severe pain   or more swelling than you did before the cast was put on. °· Your skin or fingernails below the injury turn blue or gray or feel cold or numb. °· You develop decreased feeling in your fingers. °MAKE SURE YOU: °· Understand these instructions. °· Will watch your condition. °· Will get help right away if you are not doing well or get worse. °Document Released: 01/12/2005 Document Revised: 06/27/2011 Document Reviewed: 04/22/2011 °Exit[employer]® Patient Information ©2015 [employer]. This information is not intended to replace advice given to you by your health care provider. Make sure you discuss any questions you  have with your health care provider. ° °

## 2013-12-03 ENCOUNTER — Other Ambulatory Visit: Payer: Self-pay | Admitting: Family Medicine

## 2013-12-11 ENCOUNTER — Ambulatory Visit (INDEPENDENT_AMBULATORY_CARE_PROVIDER_SITE_OTHER): Payer: Medicare Other | Admitting: Emergency Medicine

## 2013-12-11 ENCOUNTER — Encounter: Payer: Self-pay | Admitting: Emergency Medicine

## 2013-12-11 VITALS — BP 132/70 | HR 88 | Ht 65.5 in | Wt 180.0 lb

## 2013-12-11 DIAGNOSIS — R05 Cough: Secondary | ICD-10-CM

## 2013-12-11 DIAGNOSIS — R059 Cough, unspecified: Secondary | ICD-10-CM

## 2013-12-11 NOTE — Assessment & Plan Note (Signed)
Contributions of GERD and allergies. The former appears to be improved. Reminded her to start her astelin spray this Fall if her rhinitis flares.

## 2013-12-11 NOTE — Progress Notes (Signed)
Subjective:    Patient ID: [name], female    DOB: 1931-01-14, 80y.o.   MRN: [id number] HPI 80 yo woman, never smoker, hx ulcerative colitis, breast CA. Admitted for UTI, A Fib + RVR. During that hospitalization noted to have B infilltrates, was treated empirically for ? pulm edema or PNA w abx. Appearance on film and clinical hx more consistent with COP. Treated with pred 40mg  once daily starting 12/30 w clinical improvement, now follows up.   In retrospect has has dyspnea with exertion evaluated by Dr Etter Sjogren, Treated beginning in Spring '11 w Advair for ? COPD (never smoked but 2nd hand smoke)  ROV 78/13/12 -- follow up visit for B infiltrates, possible COP. We have been tapering steroids, planning a slightly slower taper for her ulcerative colitis. Breathing is better than last visit, but still some SOB with significant exertion, example shopping all day. Has been taking Advair once daily on most days; has PFT that show mixed disease.   ROV 78/30/12 -- Hx of B infiltrates, probable COP. Has been off pred since March, was doing well off of it. Stopped Advair last visit, did well off of it. She noticed more exertional SOB over the last 2 weeks. Also more allergy sx, with nasal congestion and gtt. Has been treated w abx x 2 since last visit. Some of it may be depression, she is having a hard time living alone with the loss of her husband. Hemoglobin stable on last check by Dr Olevia Perches. She injured her R ribs when bending over about 10 days ago. She was having the SOB before the rib injury  ROV 78/24/12 -- follows up for her DOE. She is better than a month ago - rib pain better. Breathing better - still feels a bit run down. She is going to slowly increase her activity. Allergy symptoms are a bit better  ROV 78/26/13 -- hx of dyspnea and infiltrates, felt to be most consistent with COP. CXR in May without infiltrates. She reports today that her cough is bothering her again. She is not on an allergy  regimen anymore. Was tried on Nexium x 2 weeks by Dr Etter Sjogren without change in her cough. She is having daily clear watery PND.   ROV 78/1/13 -- Follow up for Dyspnea, cough. Last time we started loratadine and astelin >> her cough and drainage improved. She finished the loratadine and then the cough has worsened. She stopped the astelin on her own - difficult to take. Due for CXR given hx COP, wants to defer until next time.   ROV 78/12/14 -- Follow up for Dyspnea, chronic cough. Hx of ulcerative colitis, breast CA. Admitted for UTI, A Fib + RVR, suspected COP in the past. Was seen by Dr Melvyn Novas last month with continued cough. She is s/p recent [place] and [place]issen repair. Was treated with omeprazole, zantac, levaquin. She did not take the prednisone. Returns today reporting improvement in cough.   ROV 78/17/14 -- Dyspnea, chronic cough. Hx of ulcerative colitis, breast CA. Hx A Fib + RVR, suspected COP in the past.  Has had recurrent UTI's. Her cough resolved after her Nissen, then cough returned. Most recent UGI shows that she is still refluxing.   ROV 78/201/5 -- follows for chronic cough, hx possible COP.  Has corresponded with her reflux. She was better at first after her Nissen. She uses pepcid. Does a reflux diet and has head of her bed up. She feels reflux frequently in her mouth and throat.  Coughs at night.   ROV 12/11/13 -- hx of chronic cough, hx possible COP. She has been rx for reflux (even after Nissen) with some success, improvement in cough. Her cough is still happening. The reflux is improved some (without intervention).  She is not on an allergy regimen any more - uses allergy regimen prn.   Review of Systems As above    Objective:   Physical Exam Filed Vitals:   12/11/13 1204  BP: 132/70  Pulse: 88  Height: 5' 5.5" (1.664 m)  Weight: 180 lb (81.647 kg)  SpO2: 96%   Gen: Pleasant, well-nourished, in no distress,  normal affect  ENT: No lesions,  mouth clear,  oropharynx clear, no  postnasal drip  Neck: No JVD, no TMG, no carotid bruits  Lungs: No use of accessory muscles, subtle insp squeek at LUL > R apex  Cardiovascular: RRR, heart sounds normal, soft systolic M, no peripheral edema  Musculoskeletal: No deformities, no cyanosis or clubbing  Neuro: alert, non focal  Skin: Warm, no lesions or rashes   Assessment & Plan:  Cough Contributions of GERD and allergies. The former appears to be improved. Reminded her to start her astelin spray this Fall if her rhinitis flares.

## 2013-12-11 NOTE — Patient Instructions (Addendum)
Please continue your medications as you have been taking them  Consider restarting your astelin nasal spray twice a day this Fall during allergy season.  Get the flu shot this Fall Follow with Dr Lamonte Sakai in 6 months or sooner if you have any problems

## 2013-12-25 ENCOUNTER — Other Ambulatory Visit: Payer: Self-pay | Admitting: Family Medicine

## 2013-12-25 ENCOUNTER — Telehealth: Payer: Self-pay | Admitting: Family Medicine

## 2013-12-25 NOTE — Telephone Encounter (Signed)
LORazepam (ATIVAN) 2 MG tablet 60 tablet  last filled  10/15/2013 and last seen 03/05/13   Please advise    KP

## 2013-12-25 NOTE — Telephone Encounter (Signed)
Informed patient of this and she states that she cannot come in because she is watching her grandchildren. I informed her that if she cannot come in to our office, to go to an UC

## 2013-12-25 NOTE — Telephone Encounter (Signed)
Caller name: Rianna Relation to pt: self Call back number: [phone] Pharmacy: [facility] on [address]  Reason for call:   Patient states that she has a uti and cannot come in to see anyone. She wants to know if Dr. Etter Sjogren could send in medicine for this.

## 2013-12-25 NOTE — Telephone Encounter (Signed)
No, she will need an Office visit or go to an UC. We do not fill Med's for a UTI without urine collection and Culture.     KP

## 2013-12-26 MED ORDER — LORAZEPAM 2 MG PO TABS: ORAL_TABLET | ORAL | Status: DC

## 2013-12-26 NOTE — Addendum Note (Signed)
Addended by: Ewing Schlein on: 12/26/2013 08:15 AM   Modules accepted: Orders

## 2013-12-26 NOTE — Telephone Encounter (Signed)
Rx faxed.    KP 

## 2013-12-27 ENCOUNTER — Ambulatory Visit (INDEPENDENT_AMBULATORY_CARE_PROVIDER_SITE_OTHER): Payer: Medicare Other | Admitting: Family Medicine

## 2013-12-27 ENCOUNTER — Encounter: Payer: Self-pay | Admitting: Family Medicine

## 2013-12-27 VITALS — BP 143/93 | HR 80 | Temp 98.2°F | Wt 177.9 lb

## 2013-12-27 DIAGNOSIS — F329 Major depressive disorder, single episode, unspecified: Secondary | ICD-10-CM

## 2013-12-27 DIAGNOSIS — F419 Anxiety disorder, unspecified: Secondary | ICD-10-CM

## 2013-12-27 DIAGNOSIS — E785 Hyperlipidemia, unspecified: Secondary | ICD-10-CM

## 2013-12-27 DIAGNOSIS — Z23 Encounter for immunization: Secondary | ICD-10-CM

## 2013-12-27 DIAGNOSIS — F32A Depression, unspecified: Secondary | ICD-10-CM

## 2013-12-27 DIAGNOSIS — F3289 Other specified depressive episodes: Secondary | ICD-10-CM

## 2013-12-27 DIAGNOSIS — J069 Acute upper respiratory infection, unspecified: Secondary | ICD-10-CM

## 2013-12-27 DIAGNOSIS — I10 Essential (primary) hypertension: Secondary | ICD-10-CM

## 2013-12-27 DIAGNOSIS — F341 Dysthymic disorder: Secondary | ICD-10-CM

## 2013-12-27 DIAGNOSIS — R300 Dysuria: Secondary | ICD-10-CM

## 2013-12-27 LAB — POCT URINALYSIS DIPSTICK
BILIRUBIN UA: NEGATIVE
GLUCOSE UA: NEGATIVE
KETONES UA: NEGATIVE
NITRITE UA: NEGATIVE
Spec Grav, UA: >=1.03
Urobilinogen, UA: 2
pH, UA: 5.5

## 2013-12-27 MED ORDER — BUPROPION HCL ER (XL) 150 MG PO TB24: 150.0000 mg | ORAL_TABLET | Freq: Every day | ORAL | Status: DC

## 2013-12-27 MED ORDER — CIPROFLOXACIN HCL 500 MG PO TABS: 500.0000 mg | ORAL_TABLET | Freq: Two times a day (BID) | ORAL | Status: AC

## 2013-12-27 NOTE — Assessment & Plan Note (Signed)
con't meds 

## 2013-12-27 NOTE — Progress Notes (Signed)
Pre visit review using our clinic review tool, if applicable. No additional management support is needed unless otherwise documented below in the visit note. 

## 2013-12-27 NOTE — Assessment & Plan Note (Signed)
Worsening depression Refer psych for counseling Add wellbutrin to lexapro

## 2013-12-27 NOTE — Assessment & Plan Note (Signed)
Stable con't meds 

## 2013-12-27 NOTE — Patient Instructions (Addendum)

## 2013-12-27 NOTE — Progress Notes (Signed)
  Subjective:    [name] is a 80 y.o. female who complains of burning with urination, frequency and urgency. She has had symptoms for 5 days. Patient also complains of nothing else. Patient denies back pain, congestion, cough, fever, headache, rhinitis, sorethroat, stomach ache and vaginal discharge. Patient does have a history of recurrent UTI. Patient does not have a history of pyelonephritis.  Pt also c/o URI symptoms-- she is only usine saline spray.  + nasal congestion, no fever. She is also c/o worsening depression and thinks she needs to adjust her meds.  She is not suicidal.  The following portions of the patient's history were reviewed and updated as appropriate: allergies, current medications, past family history, past medical history, past social history, past surgical history and problem list.  Review of Systems Pertinent items are noted in HPI.    Objective:  BP 143/93  Pulse 80  Temp(Src) 98.2 F (36.8 C) (Oral)  Wt 177 lb 14.6 oz (80.7 kg)  SpO2 98% General appearance: alert, cooperative, appears stated age and no distress Ears: normal TM's and external ear canals both ears Nose: clear discharge, mild congestion, turbinates red, swollen, no sinus tenderness Throat: lips, mucosa, and tongue normal; teeth and gums normal Neck: no adenopathy, supple, symmetrical, trachea midline and thyroid not enlarged, symmetric, no tenderness/mass/nodules Lungs: clear to auscultation bilaterally Heart: S1, S2 normal Extremities: extremities normal, atraumatic, no cyanosis or edema Laboratory:  Urine dipstick: trace for hemoglobin, 3+ for leukocyte esterase and moderate for protein.   Micro exam: not done.    Assessment:    UTI     Plan:    Medications: ciprofloxacin. Maintain adequate hydration. Follow up if symptoms not improving, and as needed.  1. Dysuria  - POCT Urinalysis Dipstick - Urine Culture - ciprofloxacin (CIPRO) 500 MG tablet; Take 1 tablet (500 mg total) by  mouth 2 (two) times daily.  Dispense: 10 tablet; Refill: 0  2. Depression Worsening-- not suicidal - buPROPion (WELLBUTRIN XL) 150 MG 24 hr tablet; Take 1 tablet (150 mg total) by mouth daily.  Dispense: 30 tablet; Refill: 2 - Ambulatory referral to Psychology  3. Acute upper respiratory infections of unspecified site otc flonase and zyrtec  Call prn  4. Need for prophylactic vaccination and inoculation against influenza   - Flu Vaccine QUAD 36+ mos PF IM (Fluarix Quad PF)  5. HYPERLIPIDEMIA con't diet and exercise Recheck labs  6. Anxiety and depression   7. HYPERTENSION Stable, con't meds--- slightly high today but pt had been upset and crying over the death of her husband and sone

## 2013-12-30 LAB — URINE CULTURE

## 2014-01-01 ENCOUNTER — Other Ambulatory Visit: Payer: Self-pay | Admitting: Family Medicine

## 2014-01-03 ENCOUNTER — Telehealth: Payer: Self-pay | Admitting: Family Medicine

## 2014-01-03 NOTE — Telephone Encounter (Signed)
Patient just finished the last pill for her UTI and she has gotten no relief, she wants to know if we can call something else in to help her or if she needs to be seen again. She is leaving her house at 4:45 but you can leave a message

## 2014-01-06 MED ORDER — NITROFURANTOIN MONOHYD MACRO 100 MG PO CAPS: 100.0000 mg | ORAL_CAPSULE | Freq: Two times a day (BID) | ORAL | Status: DC

## 2014-01-06 NOTE — Telephone Encounter (Signed)
macrobid 1 po bid x 7 days  i would like to repeat urine though

## 2014-01-06 NOTE — Telephone Encounter (Signed)
Spoke with patient and made her aware the Rx is being faxed at this time, she fussed at me and was upset because no on called her back on Friday, I made her aware that I left early and I did not receive the message until today, she apologized and agreed to follow up after taking her med's to recheck the UA.      KP

## 2014-01-06 NOTE — Telephone Encounter (Signed)
Please advise      KP 

## 2014-02-03 ENCOUNTER — Encounter: Payer: Self-pay | Admitting: *Deleted

## 2014-02-20 ENCOUNTER — Ambulatory Visit: Payer: Medicare Other | Admitting: Family Medicine

## 2014-02-20 ENCOUNTER — Encounter: Payer: Self-pay | Admitting: Family Medicine

## 2014-02-20 ENCOUNTER — Ambulatory Visit (INDEPENDENT_AMBULATORY_CARE_PROVIDER_SITE_OTHER): Payer: Medicare Other | Admitting: Family Medicine

## 2014-02-20 VITALS — BP 134/82 | HR 73 | Temp 97.7°F | Wt 176.4 lb

## 2014-02-20 DIAGNOSIS — F419 Anxiety disorder, unspecified: Secondary | ICD-10-CM

## 2014-02-20 DIAGNOSIS — I10 Essential (primary) hypertension: Secondary | ICD-10-CM

## 2014-02-20 LAB — POCT URINALYSIS DIPSTICK
Blood, UA: NEGATIVE
Glucose, UA: NEGATIVE
KETONES UA: NEGATIVE
LEUKOCYTES UA: NEGATIVE
Nitrite, UA: NEGATIVE
PH UA: 5.5
Urobilinogen, UA: 1

## 2014-02-20 LAB — CBC WITH DIFFERENTIAL/PLATELET
Basophils Absolute: 0 10*3/uL (ref 0.0–0.1)
Basophils Relative: 0.4 % (ref 0.0–3.0)
Eosinophils Absolute: 0.2 10*3/uL (ref 0.0–0.7)
Eosinophils Relative: 2.8 % (ref 0.0–5.0)
HCT: 45 % (ref 36.0–46.0)
HEMOGLOBIN: 14.7 g/dL (ref 12.0–15.0)
LYMPHS ABS: 1.1 10*3/uL (ref 0.7–4.0)
Lymphocytes Relative: 20.7 % (ref 12.0–46.0)
MCHC: 32.8 g/dL (ref 30.0–36.0)
MCV: 98.9 fl (ref 78.0–100.0)
MONO ABS: 0.3 10*3/uL (ref 0.1–1.0)
Monocytes Relative: 4.9 % (ref 3.0–12.0)
NEUTROS ABS: 3.9 10*3/uL (ref 1.4–7.7)
Neutrophils Relative %: 71.2 % (ref 43.0–77.0)
PLATELETS: 220 10*3/uL (ref 150.0–400.0)
RBC: 4.55 Mil/uL (ref 3.87–5.11)
RDW: 13.1 % (ref 11.5–15.5)
WBC: 5.5 10*3/uL (ref 4.0–10.5)

## 2014-02-20 LAB — LIPID PANEL
CHOLESTEROL: 223 mg/dL — AB (ref 0–200)
HDL: 45.3 mg/dL (ref >39.00)
LDL Cholesterol: 142 mg/dL — ABNORMAL HIGH (ref 0–99)
NonHDL: 177.7
TRIGLYCERIDES: 179 mg/dL — AB (ref 0.0–149.0)
Total CHOL/HDL Ratio: 5
VLDL: 35.8 mg/dL (ref 0.0–40.0)

## 2014-02-20 LAB — HEPATIC FUNCTION PANEL
ALT: 12 U/L (ref 0–35)
AST: 23 U/L (ref 0–37)
Albumin: 3.6 g/dL (ref 3.5–5.2)
Alkaline Phosphatase: 78 U/L (ref 39–117)
Bilirubin, Direct: 0 mg/dL (ref 0.0–0.3)
Total Bilirubin: 0.4 mg/dL (ref 0.2–1.2)
Total Protein: 7.5 g/dL (ref 6.0–8.3)

## 2014-02-20 LAB — BASIC METABOLIC PANEL
BUN: 22 mg/dL (ref 6–23)
CO2: 30 meq/L (ref 19–32)
Calcium: 9 mg/dL (ref 8.4–10.5)
Chloride: 103 mEq/L (ref 96–112)
Creatinine, Ser: 0.7 mg/dL (ref 0.4–1.2)
GFR: 82.61 mL/min (ref >60.00)
Glucose, Bld: 102 mg/dL — ABNORMAL HIGH (ref 70–99)
POTASSIUM: 3.5 meq/L (ref 3.5–5.1)
SODIUM: 140 meq/L (ref 135–145)

## 2014-02-20 MED ORDER — ESCITALOPRAM OXALATE 20 MG PO TABS: ORAL_TABLET | ORAL | Status: DC

## 2014-02-20 MED ORDER — LORAZEPAM 2 MG PO TABS: ORAL_TABLET | ORAL | Status: DC

## 2014-02-20 MED ORDER — TELMISARTAN-HCTZ 40-12.5 MG PO TABS: ORAL_TABLET | ORAL | Status: DC

## 2014-02-20 NOTE — Progress Notes (Signed)
  Subjective:    .    Plan:     Subjective:    Patient here for follow-up of elevated blood pressure.  She is  not exercising and is adherent to a low-salt diet.  Blood pressure is well controlled at home. Cardiac symptoms: none. Patient denies: chest pain, chest pressure/discomfort, claudication, dyspnea, exertional chest pressure/discomfort, irregular heart beat, lower extremity edema, near-syncope, orthopnea, palpitations, paroxysmal nocturnal dyspnea, syncope and tachypnea. Cardiovascular risk factors: advanced age (older than 72 for men, 13 for women), hypertension and sedentary lifestyle. Use of agents associated with hypertension: none. History of target organ damage: none. Pt is also struggling with inc anxiety --started with death of her husband and son from cancer.  She is not sleeping and thinks she may call hospice for counseling again.  The following portions of the patient's history were reviewed and updated as appropriate: allergies, current medications, past family history, past medical history, past social history, past surgical history and problem list.  Review of Systems Pertinent items are noted in HPI.     Objective:    BP 134/82 mmHg  Pulse 73  Temp(Src) 97.7 F (36.5 C) (Oral)  Wt 176 lb 6.4 oz (80.015 kg)  SpO2 97% General appearance: alert, cooperative, appears stated age and no distress Neck: no adenopathy, no carotid bruit, no JVD, supple, symmetrical, trachea midline and thyroid not enlarged, symmetric, no tenderness/mass/nodules Lungs: clear to auscultation bilaterally Heart: S1, S2 normal Extremities: extremities normal, atraumatic, no cyanosis or edema   psych-- not suicidal , homicidal Assessment:    Hypertension, normal blood pressure . Evidence of target organ damage: none.    Plan:    Medication: no change. Dietary sodium restriction. Regular aerobic exercise. Follow up: 3 months and as needed. ---- will do cpe then  1. Anxiety Discussed  going to counseling with pt since her son recently died from [place]-- she will try to go through hospice again first but pt was given numbers for [facility] counseling here - escitalopram (LEXAPRO) 20 MG tablet; TAKE 1 TABLET BY MOUTH EVERY DAY  Dispense: 30 tablet; Refill: 5 - LORazepam (ATIVAN) 2 MG tablet; TAKE 1/2 TABLET BY MOUTH EVERY 8 HOURS AS NEEDED FOR ANXIETY  Dispense: 60 tablet; Refill: 0  2. Essential hypertension stable - telmisartan-hydrochlorothiazide (MICARDIS HCT) 40-12.5 MG per tablet; 1 po qd  Dispense: 30 tablet; Refill: 5 - Basic metabolic panel - CBC with Differential - Hepatic function panel - Lipid panel - POCT urinalysis dipstick

## 2014-02-20 NOTE — Progress Notes (Signed)
Pre visit review using our clinic review tool, if applicable. No additional management support is needed unless otherwise documented below in the visit note. 

## 2014-02-20 NOTE — Patient Instructions (Signed)

## 2014-04-07 ENCOUNTER — Other Ambulatory Visit: Payer: Self-pay | Admitting: Family Medicine

## 2014-04-07 NOTE — Telephone Encounter (Signed)
Last seen and filled 02/20/14 #60. Please advise     KP

## 2014-04-24 DIAGNOSIS — Z961 Presence of intraocular lens: Secondary | ICD-10-CM | POA: Diagnosis not present

## 2014-04-24 DIAGNOSIS — H18413 Arcus senilis, bilateral: Secondary | ICD-10-CM | POA: Diagnosis not present

## 2014-04-24 DIAGNOSIS — H47019 Ischemic optic neuropathy, unspecified eye: Secondary | ICD-10-CM | POA: Diagnosis not present

## 2014-05-23 ENCOUNTER — Encounter: Payer: Self-pay | Admitting: Family Medicine

## 2014-05-23 ENCOUNTER — Ambulatory Visit (INDEPENDENT_AMBULATORY_CARE_PROVIDER_SITE_OTHER): Payer: Medicare Other | Admitting: Family Medicine

## 2014-05-23 VITALS — BP 112/72 | HR 60 | Temp 97.9°F | Wt 175.0 lb

## 2014-05-23 DIAGNOSIS — R739 Hyperglycemia, unspecified: Secondary | ICD-10-CM | POA: Diagnosis not present

## 2014-05-23 DIAGNOSIS — Z23 Encounter for immunization: Secondary | ICD-10-CM | POA: Diagnosis not present

## 2014-05-23 DIAGNOSIS — I10 Essential (primary) hypertension: Secondary | ICD-10-CM | POA: Diagnosis not present

## 2014-05-23 DIAGNOSIS — E785 Hyperlipidemia, unspecified: Secondary | ICD-10-CM | POA: Diagnosis not present

## 2014-05-23 DIAGNOSIS — F411 Generalized anxiety disorder: Secondary | ICD-10-CM

## 2014-05-23 DIAGNOSIS — Z79899 Other long term (current) drug therapy: Secondary | ICD-10-CM | POA: Diagnosis not present

## 2014-05-23 LAB — BASIC METABOLIC PANEL
BUN: 23 mg/dL (ref 6–23)
CALCIUM: 9.3 mg/dL (ref 8.4–10.5)
CO2: 28 meq/L (ref 19–32)
Chloride: 105 mEq/L (ref 96–112)
Creatinine, Ser: 0.74 mg/dL (ref 0.40–1.20)
GFR: 79.99 mL/min (ref >60.00)
Glucose, Bld: 106 mg/dL — ABNORMAL HIGH (ref 70–99)
Potassium: 3.4 mEq/L — ABNORMAL LOW (ref 3.5–5.1)
SODIUM: 139 meq/L (ref 135–145)

## 2014-05-23 LAB — LIPID PANEL
Cholesterol: 191 mg/dL (ref 0–200)
HDL: 48 mg/dL (ref >39.00)
LDL Cholesterol: 120 mg/dL — ABNORMAL HIGH (ref 0–99)
NonHDL: 143
Total CHOL/HDL Ratio: 4
Triglycerides: 117 mg/dL (ref 0.0–149.0)
VLDL: 23.4 mg/dL (ref 0.0–40.0)

## 2014-05-23 LAB — HEPATIC FUNCTION PANEL
ALK PHOS: 82 U/L (ref 39–117)
ALT: 10 U/L (ref 0–35)
AST: 19 U/L (ref 0–37)
Albumin: 4 g/dL (ref 3.5–5.2)
Bilirubin, Direct: 0.1 mg/dL (ref 0.0–0.3)
TOTAL PROTEIN: 7.2 g/dL (ref 6.0–8.3)
Total Bilirubin: 0.5 mg/dL (ref 0.2–1.2)

## 2014-05-23 LAB — HEMOGLOBIN A1C: Hgb A1c MFr Bld: 6.3 % (ref 4.6–6.5)

## 2014-05-23 MED ORDER — LORAZEPAM 2 MG PO TABS: ORAL_TABLET | ORAL | Status: DC

## 2014-05-23 NOTE — Progress Notes (Signed)
Subjective:    Patient ID: [name], female    DOB: 08-Jan-1931, 81 y.o.   MRN: [id number]  HPI  Patient here for htn, hyperlipidemia and hyperglycemia.  No complaints.    Past Medical History  Diagnosis Date  . Hypertension   . Osteoporosis   . COPD (chronic obstructive pulmonary disease)   . Hyperlipidemia   . Anxiety   . Depression   . Breast cancer 13 years ago    s/p tamoxifen   . Peptic ulcer disease   . Hiatal hernia   . Ulcerative colitis   . GERD (gastroesophageal reflux disease)   . Hiatal hernia   . Osteoarthritis   . Diverticulosis   . Esophageal stricture   . Internal hemorrhoids   . Gastric outlet obstruction   . Rheumatic heart disease   . Glaucoma     Review of Systems  Constitutional: Negative for activity change, appetite change, fatigue and unexpected weight change.  Respiratory: Negative for cough and shortness of breath.   Cardiovascular: Negative for chest pain, palpitations and leg swelling.  Psychiatric/Behavioral: Negative for behavioral problems and dysphoric mood. The patient is not nervous/anxious.        Objective:    Physical Exam  Constitutional: She is oriented to person, place, and time. She appears well-developed and well-nourished. No distress.  HENT:  Right Ear: External ear normal.  Left Ear: External ear normal.  Nose: Nose normal.  Mouth/Throat: Oropharynx is clear and moist.  Eyes: EOM are normal. Pupils are equal, round, and reactive to light.  Neck: Normal range of motion. Neck supple. No JVD present.  Cardiovascular: Normal rate, regular rhythm and normal heart sounds.   No murmur heard. Pulmonary/Chest: Effort normal and breath sounds normal. No respiratory distress. She has no wheezes. She has no rales. She exhibits no tenderness.  Musculoskeletal: She exhibits no edema or tenderness.  Lymphadenopathy:    She has no cervical adenopathy.  Neurological: She is alert and oriented to person, place, and time.    Psychiatric: She has a normal mood and affect. Her behavior is normal. Judgment and thought content normal.    BP 112/72 mmHg  Pulse 60  Temp(Src) 97.9 F (36.6 C) (Oral)  Wt 175 lb (79.379 kg)  SpO2 98% Wt Readings from Last 3 Encounters:  05/23/14 175 lb (79.379 kg)  02/20/14 176 lb 6.4 oz (80.015 kg)  12/27/13 177 lb 14.6 oz (80.7 kg)     Lab Results  Component Value Date   WBC 5.5 02/20/2014   HGB 14.7 02/20/2014   HCT 45.0 02/20/2014   PLT 220.0 02/20/2014   GLUCOSE 102* 02/20/2014   CHOL 223* 02/20/2014   TRIG 179.0* 02/20/2014   HDL 45.30 02/20/2014   LDLCALC 142* 02/20/2014   ALT 12 02/20/2014   AST 23 02/20/2014   NA 140 02/20/2014   K 3.5 02/20/2014   CL 103 02/20/2014   CREATININE 0.7 02/20/2014   BUN 22 02/20/2014   CO2 30 02/20/2014   TSH 1.084 09/30/2011   INR 1.02 04/14/2010   HGBA1C 6.1 02/04/2011    Dg Forearm Left  11/06/2013   CLINICAL DATA:  Status post fall with wrist and forearm deformity  EXAM: RIGHT FOREARM - 2 VIEW  COMPARISON:  Left wrist series of today's date  FINDINGS: The shafts of the radius and ulna are intact. There is an impacted fracture of the distal radial metaphysis. An ulnar styloid fracture is also present. Proximally the radial head and olecranon  are intact.  IMPRESSION: There are acute fractures of the distal radial metaphysis and ulnar styloid.   Electronically Signed   By: David  Martinique   On: 11/06/2013 12:25   Dg Wrist Complete Left  11/06/2013   CLINICAL DATA:  Wrist deformity and pain status post fall  EXAM: RIGHT WRIST - COMPLETE 3+ VIEW  COMPARISON:  Forearm film of today's date.  FINDINGS: There is a comminuted impacted fracture of the distal left radial metaphysis. There is mild angulation with the apex volar. There is a tiny ulnar styloid fracture. The carpal bones and metacarpals are intact. There is diffuse osteopenia.  IMPRESSION: There are acute fractures of the metaphysis of the distal radius and of the ulnar  styloid.   Electronically Signed   By: David  Martin[place]   On: 11/06/2013 12:26       Assessment & Plan:   Problem List Items Addressed This Visit    None    Visit Diagnoses    Hyperglycemia    -  Primary    Relevant Orders    Basic metabolic panel    Hemoglobin A1c    POCT urinalysis dipstick    Microalbumin / creatinine urine ratio    Hyperlipidemia        Relevant Orders    Hepatic function panel    Lipid panel    Essential hypertension        Relevant Orders    Basic metabolic panel    POCT urinalysis dipstick    Microalbumin / creatinine urine ratio        [name], DO .

## 2014-05-23 NOTE — Assessment & Plan Note (Signed)
Stable con't micardis

## 2014-05-23 NOTE — Assessment & Plan Note (Signed)
Check labs 

## 2014-05-23 NOTE — Progress Notes (Signed)
Pre visit review using our clinic review tool, if applicable. No additional management support is needed unless otherwise documented below in the visit note. 

## 2014-05-23 NOTE — Patient Instructions (Signed)

## 2014-06-11 ENCOUNTER — Encounter: Payer: Self-pay | Admitting: Family Medicine

## 2014-06-17 ENCOUNTER — Telehealth: Payer: Self-pay | Admitting: Family Medicine

## 2014-06-17 DIAGNOSIS — Z0110 Encounter for examination of ears and hearing without abnormal findings: Secondary | ICD-10-CM

## 2014-06-17 NOTE — Telephone Encounter (Signed)
Caller name: Nataliee Relation to pt: self Call back number: [phone] Pharmacy:  Reason for call:   Patient states that she has an appointment [place] [facility] on 06/23/14 and needs referral placed. P: [id number]. Hearing checked.

## 2014-06-17 NOTE — Telephone Encounter (Signed)
Ref placed.      KP 

## 2014-06-23 DIAGNOSIS — H903 Sensorineural hearing loss, bilateral: Secondary | ICD-10-CM | POA: Diagnosis not present

## 2014-07-08 ENCOUNTER — Telehealth: Payer: Self-pay | Admitting: *Deleted

## 2014-07-08 NOTE — Telephone Encounter (Signed)
Pt dropped off handicap placard application needed for [place]. Form filled out as much as possible and forwarded to Dr. Etter Sjogren. JG//CMA

## 2014-07-14 NOTE — Telephone Encounter (Signed)
Called and informed pt that form is ready for pick up at our front desk. Copy sent for scanning. JG//CMA

## 2014-07-25 ENCOUNTER — Ambulatory Visit (INDEPENDENT_AMBULATORY_CARE_PROVIDER_SITE_OTHER)
Admission: RE | Admit: 2014-07-25 | Discharge: 2014-07-25 | Disposition: A | Discharge status: Home / Self Care | Payer: Medicare Other | Source: Ambulatory Visit | Attending: Emergency Medicine | Admitting: Emergency Medicine

## 2014-07-25 ENCOUNTER — Encounter: Payer: Self-pay | Admitting: Emergency Medicine

## 2014-07-25 ENCOUNTER — Ambulatory Visit (INDEPENDENT_AMBULATORY_CARE_PROVIDER_SITE_OTHER): Payer: Medicare Other | Admitting: Emergency Medicine

## 2014-07-25 VITALS — BP 116/72 | HR 80 | Ht 65.0 in | Wt 175.0 lb

## 2014-07-25 DIAGNOSIS — R059 Cough, unspecified: Secondary | ICD-10-CM

## 2014-07-25 DIAGNOSIS — R05 Cough: Secondary | ICD-10-CM

## 2014-07-25 MED ORDER — PANTOPRAZOLE SODIUM 40 MG PO TBEC: 40.0000 mg | DELAYED_RELEASE_TABLET | Freq: Every day | ORAL | Status: DC

## 2014-07-25 NOTE — Progress Notes (Signed)
Subjective:    Patient ID: [name], female    DOB: July 01, 1930, 82y.o.   MRN: [id number] HPI 81 yo woman, never smoker, hx ulcerative colitis, breast CA. Admitted for UTI, A Fib + RVR. During that hospitalization noted to have B infilltrates, was treated empirically for ? pulm edema or PNA w abx. Appearance on film and clinical hx more consistent with COP. Treated with pred 40mg  once daily starting 12/30 w clinical improvement, now follows up.   In retrospect has has dyspnea with exertion evaluated by Dr Etter Sjogren, Treated beginning in Spring '11 w Advair for ? COPD (never smoked but 2nd hand smoke)  ROV 05/31/10 -- follow up visit for B infiltrates, possible COP. We have been tapering steroids, planning a slightly slower taper for her ulcerative colitis. Breathing is better than last visit, but still some SOB with significant exertion, example shopping all day. Has been taking Advair once daily on most days; has PFT that show mixed disease.   ROV 08/16/10 -- Hx of B infiltrates, probable COP. Has been off pred since March, was doing well off of it. Stopped Advair last visit, did well off of it. She noticed more exertional SOB over the last 2 weeks. Also more allergy sx, with nasal congestion and gtt. Has been treated w abx x 2 since last visit. Some of it may be depression, she is having a hard time living alone with the loss of her husband. Hemoglobin stable on last check by Dr Olevia Perches. She injured her R ribs when bending over about 10 days ago. She was having the SOB before the rib injury  ROV 09/09/10 -- follows up for her DOE. She is better than a month ago - rib pain better. Breathing better - still feels a bit run down. She is going to slowly increase her activity. Allergy symptoms are a bit better  ROV 12/12/11 -- hx of dyspnea and infiltrates, felt to be most consistent with COP. CXR in May without infiltrates. She reports today that her cough is bothering her again. She is not on an allergy  regimen anymore. Was tried on Nexium x 2 weeks by Dr Etter Sjogren without change in her cough. She is having daily clear watery PND.   ROV 01/17/12 -- Follow up for Dyspnea, cough. Last time we started loratadine and astelin >> her cough and drainage improved. She finished the loratadine and then the cough has worsened. She stopped the astelin on her own - difficult to take. Due for CXR given hx COP, wants to defer until next time.   ROV 09/27/12 -- Follow up for Dyspnea, chronic cough. Hx of ulcerative colitis, breast CA. Admitted for UTI, A Fib + RVR, suspected COP in the past. Was seen by Dr Melvyn Novas last month with continued cough. She is s/p recent [place]augh and Nissen repair. Was treated with omeprazole, zantac, levaquin. She did not take the prednisone. Returns today reporting improvement in cough.   ROV 04/03/13 -- Dyspnea, chronic cough. Hx of ulcerative colitis, breast CA. Hx A Fib + RVR, suspected COP in the past.  Has had recurrent UTI's. Her cough resolved after her Nissen, then cough returned. Most recent UGI shows that she is still refluxing.   ROV 5/201/5 -- follows for chronic cough, hx possible COP.  Has corresponded with her reflux. She was better at first after her Nissen. She uses pepcid. Does a reflux diet and has head of her bed up. She feels reflux frequently in her mouth and throat.  Coughs at night.   ROV 12/11/13 -- hx of chronic cough, hx possible COP. She has been rx for reflux (even after Nissen) with some success, improvement in cough. Her cough is still happening. The reflux is improved some (without intervention).  She is not on an allergy regimen any more - uses allergy regimen prn.   ROV 07/25/14 -- follow-up visit for chronic cough. She also has a history of possible COP that we treated with empiric corticosteroids back in 2012.  She returns today with hoarse voice and cough that has been bad for about two week. She restarted loratadine 2 weeks ago, fluticasone nasal spray at the same time.  She is not using astelin.  The cough is non-productive, happens at any time.  She is on prilosec qd. Not on [place]. She has some exertional SOB at times.                                                                                                                                                                                                                                                                                                                                                     Review of Systems As above    Objective:   Physical Exam Filed Vitals:   07/25/14 1449  BP: 116/72  Pulse: 80  Height: 5\' 5"  (1.651 m)  Weight: 175 lb (79.379 kg)  SpO2: 94%   Gen: Pleasant, well-nourished, in no distress,  normal affect  ENT: No lesions,  mouth clear,  oropharynx clear, no postnasal drip  Neck: No JVD, no TMG, no carotid bruits  Lungs: No use of accessory muscles, subtle insp squeek at LUL > R apex  Cardiovascular: RRR, heart sounds normal, soft systolic M, no peripheral edema  Musculoskeletal: No deformities, no cyanosis or clubbing  Neuro: alert, non focal  Skin:  Warm, no lesions or rashes   Assessment & Plan:  Cough Chronic cough likely being impacted by both GERD and allergic rhinitis. We will try to ramp up her therapy for both. I will also check a chest x-ray should she has a history in 2012 of pulmonary infiltrates thought to be consistent with COP

## 2014-07-25 NOTE — Patient Instructions (Addendum)
We will perform a CXR today to compare with last time.  Please stop. Omeprazole. We will start Protonix 40 mg once a day Please continue loratadine and your nasal steroid spray every day during the allergy season Start using nasal saline washes once a day Follow with Dr Lamonte Sakai in 2 months to reassess your cough, or sooner if you have any problems.

## 2014-07-25 NOTE — Assessment & Plan Note (Signed)
Chronic cough likely being impacted by both GERD and allergic rhinitis. We will try to ramp up her therapy for both. I will also check a chest x-ray should she has a history in 2012 of pulmonary infiltrates thought to be consistent with COP

## 2014-08-11 ENCOUNTER — Telehealth: Payer: Self-pay | Admitting: Family Medicine

## 2014-08-11 NOTE — Telephone Encounter (Signed)
Patient Name: [name]  DOB: Oct 28, 1930    Initial Comment Caller states mother fell today, has a knot on the back of her head. 911 was called but she did not want to go to the hospital. Wants to know if she can take [facility] powders for headache.    Nurse Assessment  Nurse: Thad Ranger RN, Denise Date/Time (Eastern Time): 08/11/2014 6:10:48 PM  Confirm and document reason for call. If symptomatic, describe symptoms. ---Caller states mother fell today while in the shower, foot slipped in tub and she fell over the side of the bathtub hitting her head on the toilet. States 911 was called, but she did not want to go to the hospital. Pt wanted to know if she can take [facility] powders for headache. States she does not take any anticoagulants.  Has the patient traveled out of the country within the last 30 days? ---Not Applicable  Does the patient require triage? ---Yes  Related visit to physician within the last 2 weeks? ---No  Does the PT have any chronic conditions? (i.e. diabetes, asthma, etc.) ---Yes  List chronic conditions. ---HTN, anxiety     Guidelines    Guideline Title Affirmed Question Affirmed Notes  Head Injury Scalp swelling, bruise or pain (all triage questions negative)    Final Disposition User   [place], RN, Langley Gauss

## 2014-08-12 NOTE — Telephone Encounter (Signed)
Notified patient that if she feels any dizziness, progressing weakness, shortness of breath or pain in head increases to call back or go to ED if it is severe.

## 2014-08-12 NOTE — Telephone Encounter (Signed)
Called patient to follow-up.  She states that her head feels like it is irritated, like it is sore.  She does report a little bit of  "a rise" in the back of the head.  She denies dizziness.  She feels irritated that she can't do as much, she has been so independent.  She states she has family staying with her.

## 2014-08-26 ENCOUNTER — Telehealth: Payer: Self-pay | Admitting: Family Medicine

## 2014-08-26 DIAGNOSIS — F411 Generalized anxiety disorder: Secondary | ICD-10-CM

## 2014-08-26 MED ORDER — LORAZEPAM 2 MG PO TABS: ORAL_TABLET | ORAL | Status: DC

## 2014-08-26 NOTE — Telephone Encounter (Signed)
Caller name: Leslye Relation to pt: self Call back number: [phone] Pharmacy: [facility] on [address]  Reason for call:   Requesting lorazepam refill

## 2014-08-26 NOTE — Telephone Encounter (Signed)
Last seen, filled and UDS done on 05/23/14 #60 no refills. Low risk UDS    Please advise      KP

## 2014-08-26 NOTE — Telephone Encounter (Signed)
Pt called checking on the status of RX pt states she checked with pharmacy just a few minutes ago and pharmacy states has not received.

## 2014-08-26 NOTE — Telephone Encounter (Signed)
Rx faxed.    KP 

## 2014-08-26 NOTE — Telephone Encounter (Signed)
error:[id number] ° °

## 2014-08-26 NOTE — Telephone Encounter (Signed)
Refill x1 

## 2014-10-03 ENCOUNTER — Other Ambulatory Visit: Payer: Self-pay | Admitting: Family Medicine

## 2014-10-14 ENCOUNTER — Other Ambulatory Visit: Payer: Self-pay | Admitting: Family Medicine

## 2014-10-14 NOTE — Telephone Encounter (Signed)
Last seen 05/23/14 and filled 08/26/14 #60 UDS 05/23/14 low risk   Please advise    KP

## 2014-11-26 ENCOUNTER — Telehealth: Payer: Self-pay

## 2014-11-26 NOTE — Telephone Encounter (Signed)
Called to schedule medicare wellness visit with [employer]. Left a message for call back.

## 2014-11-27 NOTE — Telephone Encounter (Signed)
Patient returning your call please call [phone]. Patient will be out of town until Monday.

## 2014-11-28 NOTE — Telephone Encounter (Signed)
Left a message for call back.  

## 2014-12-01 NOTE — Telephone Encounter (Signed)
Pt returning your call-please call back

## 2014-12-02 NOTE — Telephone Encounter (Signed)
Appointment scheduled.

## 2014-12-19 ENCOUNTER — Ambulatory Visit (INDEPENDENT_AMBULATORY_CARE_PROVIDER_SITE_OTHER): Payer: Medicare Other

## 2014-12-19 VITALS — BP 118/72 | HR 71 | Ht 65.0 in | Wt 168.6 lb

## 2014-12-19 DIAGNOSIS — Z23 Encounter for immunization: Secondary | ICD-10-CM

## 2014-12-19 DIAGNOSIS — Z0000 Encounter for general adult medical examination without abnormal findings: Secondary | ICD-10-CM

## 2014-12-19 NOTE — Progress Notes (Signed)
Subjective:   [name] is a 81 y.o. female who presents for Medicare Annual (Subsequent) preventive examination.  Review of Systems:  Sleep patterns:  Sleeps 6-7 hours at night/occasionally takes naps during the day.   Home Safety/Smoke Alarms:  Feels safe at home. Lives at home alone.  1 story home with basement-has stair rails.  Smoke detectors present.  [employer] and daughter and son has keys to home.   Firearm Safety:  No firearms.   Seat Belt Safety/Bike Helmet:  Always wears seat belt.     Counseling:   Eye Exam- less than 1 year ago---Dr. [place] next week.  Goes regularly.   Female:  Pap-Hysterectomy      Mammo-DUE, pt declines.     Dexa scan-11/08/11       CCS- 05/29/07  Tdap/tetanus and shingles due.  Discussed.  Pt will consider for later date.  High Dose Flu vaccine:given today.      Objective:     Vitals: BP 118/72 mmHg  Pulse 71  Ht 5\' 5"  (1.651 m)  Wt 168 lb 9.6 oz (76.476 kg)  BMI 28.06 kg/m2  SpO2 99%  Tobacco History  Smoking status  . Never Smoker   Smokeless tobacco  . Never Used     Counseling given: Yes   Past Medical History  Diagnosis Date  . Hypertension   . Osteoporosis   . COPD (chronic obstructive pulmonary disease)   . Hyperlipidemia   . Anxiety   . Depression   . Breast cancer 13 years ago    s/p tamoxifen   . Peptic ulcer disease   . Hiatal hernia   . Ulcerative colitis   . GERD (gastroesophageal reflux disease)   . Hiatal hernia   . Osteoarthritis   . Diverticulosis   . Esophageal stricture   . Internal hemorrhoids   . Gastric outlet obstruction   . Rheumatic heart disease   . Glaucoma    Past Surgical History  Procedure Laterality Date  . Cataract extraction  08/2006 and 10/2006  . Total knee arthroplasty      left  . Hemorrhoid surgery    . Tonsillectomy    . Abdominal hysterectomy    . Joint replacement      right  . Mastectomy  1995    right  . Breast surgery      right  . Breast  reconstruction  2000    right  . Laparoscopic nissen fundoplication N/A 10/13/3660    Procedure: LAP REPAIR OF LARGE TYPE III MIXED HIATUS HERNIA WITH NISSEN FUNOPLICATION;  Surgeon: Pedro Earls, MD;  Location: [facility];  Service: General;  Laterality: N/A;  . Hiatal hernia repair N/A 09/03/2012    Procedure: LAPAROSCOPIC REPAIR OF HIATAL HERNIA;  Surgeon: Pedro Earls, MD;  Location: [facility];  Service: General;  Laterality: N/A;   Family History  Problem Relation Age of Onset  . Coronary artery disease Sister     x 2 in 31's  . Coronary artery disease Brother     x 2 in 70's  . Diabetes    . Colon cancer Father 68  . Hypertension    . Stroke Mother 51  . Crohn's disease Brother   . Breast cancer      Aunt  . Cirrhosis Brother   . Liver disease Son    History  Sexual Activity  . Sexual Activity: Not on file    Outpatient Encounter Prescriptions as of 12/19/2014  Medication Sig  . aspirin 81 MG tablet Take 81 mg by mouth daily.    [name] azelastine (ASTELIN) 137 MCG/SPRAY nasal spray Place 1 spray into the nose 2 (two) times daily as needed (for allergies). Use in each nostril as directed  . Biotin (BIOTIN ULTRA STRENGTH) 10 MG CAPS Take 1 capsule by mouth daily.  [name] escitalopram (LEXAPRO) 20 MG tablet TAKE 1 TABLET BY MOUTH EVERY DAY  . LORazepam (ATIVAN) 2 MG tablet TAKE 1/2 TABLET BY MOUTH EVERY 8 HOURS AS NEEDED FOR ANXIETY  . pantoprazole (PROTONIX) 40 MG tablet Take 1 tablet (40 mg total) by mouth daily.  . polyethylene glycol (MIRALAX / GLYCOLAX) packet Take 17 g by mouth daily.  . RESTASIS 0.05 % ophthalmic emulsion Place 1 drop into both eyes at bedtime.   [name] telmisartan-hydrochlorothiazide (MICARDIS HCT) 40-12.5 MG per tablet TAKE 1 TABLET BY MOUTH EVERY DAY  . vitamin B-12 (CYANOCOBALAMIN) 1000 MCG tablet Take 1,000 mcg by mouth daily.    [name] PATADAY 0.2 % SOLN Place 1 drop into both eyes daily as needed (eye allergies.).   . [DISCONTINUED] Iron 66 MG TABS Take 65 mg by  mouth daily.   . [DISCONTINUED] omeprazole (PRILOSEC) 20 MG capsule Take 20 mg by mouth daily.  . [DISCONTINUED] Respiratory Therapy Supplies (FLUTTER) DEVI Use as directed (Patient not taking: Reported on 12/19/2014)   No facility-administered encounter medications on file as of 12/19/2014.    Activities of Daily Living In your present state of health, do you have any difficulty performing the following activities: 12/19/2014 05/23/2014  Hearing? Tempie Donning  Vision? Y Y  Difficulty concentrating or making decisions? N N  Walking or climbing stairs? Y N  Dressing or bathing? N N  Doing errands, shopping? N N  Preparing Food and eating ? N -  Using the Toilet? N -  In the past six months, have you accidently leaked urine? N -  Do you have problems with loss of bowel control? N -  Managing your Medications? N -  Managing your Finances? N -  Housekeeping or managing your Housekeeping? N -    Patient Care Team: Rosalita Chessman, DO as PCP - General Gevena Cotton, MD as Consulting Physician (Ophthalmology) Lafayette Dragon, MD as Consulting Physician (Gastroenterology) Collene Gobble, MD as Consulting Physician (Pulmonary Disease)   Care team: updated     Assessment:  Hyperlipidemia- Last lipid panel: 05/23/14.  LDL-120 elevated.  Not currently on lipid lowering agent.  Encouraged heart healthy diet and exercise.  Follow up appt with Dr. Etter Sjogren scheduled.    Hypertension- Stable.  On telmisartan-hctz.  Encouraged heart healthy, low sodium diet and exercise.  Follow up appt with Dr. Etter Sjogren scheduled.   Carotid arterial disease-  Last carotid ultrasound-11/12/12.  Showed stable carotid artery plaques per Dr. Birdie Riddle.  Follow up appt with Dr. Etter Sjogren scheduled.    Chronic cough- Pt states it is non-productive.  Dr. Lamonte Sakai says it probably due to GERD and allergic rhinitis.  CXR ordered by Dr. Judeth Porch.  On protonix and Astelin spray.    Anxiety/depression- stabled on medication. Encouraged pt to stay  active, exercise, do things she enjoys doing (helping others).    Exercise Activities and Dietary recommendations Current Exercise Habits:: Home exercise routine, Type of exercise: walking, Time (Minutes): 20, Frequency (Times/Week): 5, Weekly Exercise (Minutes/Week): 100   Diet: Not much of an appetite some days.  Does not cook much. Daughter cooks meals for her.  Salads, tomato sandwiches,  meat from the deli.   States "I probably eat too many sweets."   Goals    . Pt states she wants to remain independent.      Stay active/physical fit.    Prevent falls.    Help others.    Brain stimulating activity to keep mind sharp.        Fall Risk Fall Risk  12/19/2014 05/23/2014 03/05/2013  Falls in the past year? Yes Yes No  Number falls in past yr: 1 2 or more -  Injury with Fall? No - -  Risk Factor Category  - High Fall Risk -  Risk for fall due to : Impaired balance/gait;Impaired vision Impaired balance/gait -  Follow up Education provided - -   Depression Screen PHQ 2/9 Scores 12/19/2014 05/23/2014 03/05/2013 06/28/2011  PHQ - 2 Score 2 0 1 0  PHQ- 9 Score 3 - - -     Cognitive Testing MMSE - Mini Mental State Exam 12/19/2014  Orientation to time 5  Orientation to Place 5  Registration 3  Attention/ Calculation 5  Recall 3  Language- name 2 objects 2  Language- repeat 1  Language- follow 3 step command 3  Language- read & follow direction 1  Write a sentence 1  Copy design 1  Total score 30    Immunization History  Administered Date(s) Administered  . Influenza Whole 04/02/2007, 01/02/2009, 01/06/2010, 01/10/2012  . Influenza, High Dose Seasonal PF 01/21/2013, 12/19/2014  . Influenza,inj,Quad PF,36+ Mos 12/27/2013  . PPD Test 03/05/2013  . Pneumococcal Conjugate-13 05/23/2014  . Pneumococcal Polysaccharide-23 01/30/2008   Screening Tests Health Maintenance  Topic Date Due  . ZOSTAVAX  01/17/2015 (Originally 01/17/1993)  . TETANUS/TDAP  01/17/2015 (Originally 01[birth year]18/1950)   . MAMMOGRAM  03/30/2015 (Originally 10/24/2012)  . INFLUENZA VACCINE  11/17/2015  . COLONOSCOPY  05/28/2017  . DEXA SCAN  Completed  . PNA vac Low Risk Adult  Completed    Plan:  Follow up with Dr. Etter Sjogren in 1 month (October, 2016).    During the course of the visit the patient was educated and counseled about the following appropriate screening and preventive services:   Vaccines to include Pneumoccal, Influenza, Hepatitis B, Td, Zostavax, HCV  Electrocardiogram  Cardiovascular Disease  Colorectal cancer screening  Bone density screening  Diabetes screening  Glaucoma screening  Mammography/PAP  Nutrition counseling   Patient Instructions (the written plan) was given to the patient.   Rudene Anda, RN  12/19/2014

## 2014-12-19 NOTE — Progress Notes (Signed)
Pre visit review using our clinic review tool, if applicable. No additional management support is needed unless otherwise documented below in the visit note. 

## 2014-12-19 NOTE — Patient Instructions (Addendum)
Consider td/tetanus and shingles vaccine. Check with insurance company to see if they will cover it.    Follow up with Dr. Etter Sjogren as scheduled.   Make time for family and friends.  Healthy relationships are important.  Take medications as directed by your provider.  Maintain a healthy weight and a trim waistline.  Eat healthy meals and snacks, rich in fruits, vegetables, whole grains and lean proteins.  Aim for 150 minutes of moderate physical activity each week.  Don't smoke.  Avoid alcohol or drink in moderation.  Manage stress through prayer, meditation or mindful relaxation.  Get seven to nine hours of quality sleep each night.        Fall Prevention and Home Safety Falls cause injuries and can affect all age groups. It is possible to prevent falls.  HOW TO PREVENT FALLS  Wear shoes with rubber soles that do not have an opening for your toes.  Keep the inside and outside of your house well lit.  Use night lights throughout your home.  Remove clutter from floors.  Clean up floor spills.  Remove throw rugs or fasten them to the floor with carpet tape.  Do not place electrical cords across pathways.  Put grab bars by your tub, shower, and toilet. Do not use towel bars as grab bars.  Put handrails on both sides of the stairway. Fix loose handrails.  Do not climb on stools or stepladders, if possible.  Do not wax your floors.  Repair uneven or unsafe sidewalks, walkways, or stairs.  Keep items you use a lot within reach.  Be aware of pets.  Keep emergency numbers next to the telephone.  Put smoke detectors in your home and near bedrooms. Ask your doctor what other things you can do to prevent falls. Document Released: 01/29/2009 Document Revised: 10/04/2011 Document Reviewed: 07/05/2011 [facility] Patient Information 2015 [place], [place]. This information is not intended to replace advice given to you by your health care provider. Make sure you discuss any  questions you have with your health care provider.  Health Maintenance Adopting a healthy lifestyle and getting preventive care can go a long way to promote health and wellness. Talk with your health care provider about what schedule of regular examinations is right for you. This is a good chance for you to check in with your provider about disease prevention and staying healthy. In between checkups, there are plenty of things you can do on your own. Experts have done a lot of research about which lifestyle changes and preventive measures are most likely to keep you healthy. Ask your health care provider for more information. WEIGHT AND DIET  Eat a healthy diet  Be sure to include plenty of vegetables, fruits, low-fat dairy products, and lean protein.  Do not eat a lot of foods high in solid fats, added sugars, or salt.  Get regular exercise. This is one of the most important things you can do for your health.  Most adults should exercise for at least 150 minutes each week. The exercise should increase your heart rate and make you sweat (moderate-intensity exercise).  Most adults should also do strengthening exercises at least twice a week. This is in addition to the moderate-intensity exercise.  Maintain a healthy weight  Body mass index (BMI) is a measurement that can be used to identify possible weight problems. It estimates body fat based on height and weight. Your health care provider can help determine your BMI and help you achieve or maintain  a healthy weight.  For females 52 years of age and older:   A BMI below 18.5 is considered underweight.  A BMI of 18.5 to 24.9 is normal.  A BMI of 25 to 29.9 is considered overweight.  A BMI of 30 and above is considered obese.  Watch levels of cholesterol and blood lipids  You should start having your blood tested for lipids and cholesterol at 79 years of age, then have this test every 5 years.  You may need to have your cholesterol  levels checked more often if:  Your lipid or cholesterol levels are high.  You are older than 79 years of age.  You are at high risk for heart disease.  CANCER SCREENING   Lung Cancer  Lung cancer screening is recommended for adults 36-26 years old who are at high risk for lung cancer because of a history of smoking.  A yearly low-dose CT scan of the lungs is recommended for people who:  Currently smoke.  Have quit within the past 15 years.  Have at least a 30-pack-year history of smoking. A pack year is smoking an average of one pack of cigarettes a day for 1 year.  Yearly screening should continue until it has been 15 years since you quit.  Yearly screening should stop if you develop a health problem that would prevent you from having lung cancer treatment.  Breast Cancer  Practice breast self-awareness. This means understanding how your breasts normally appear and feel.  It also means doing regular breast self-exams. Let your health care provider know about any changes, no matter how small.  If you are in your 20s or 30s, you should have a clinical breast exam (CBE) by a health care provider every 1-3 years as part of a regular health exam.  If you are 13 or older, have a CBE every year. Also consider having a breast X-ray (mammogram) every year.  If you have a family history of breast cancer, talk to your health care provider about genetic screening.  If you are at high risk for breast cancer, talk to your health care provider about having an MRI and a mammogram every year.  Breast cancer gene (BRCA) assessment is recommended for women who have family members with BRCA-related cancers. BRCA-related cancers include:  Breast.  Ovarian.  Tubal.  Peritoneal cancers.  Results of the assessment will determine the need for genetic counseling and BRCA1 and BRCA2 testing. Cervical Cancer Routine pelvic examinations to screen for cervical cancer are no longer  recommended for nonpregnant women who are considered low risk for cancer of the pelvic organs (ovaries, uterus, and vagina) and who do not have symptoms. A pelvic examination may be necessary if you have symptoms including those associated with pelvic infections. Ask your health care provider if a screening pelvic exam is right for you.   The Pap test is the screening test for cervical cancer for women who are considered at risk.  If you had a hysterectomy for a problem that was not cancer or a condition that could lead to cancer, then you no longer need Pap tests.  If you are older than 65 years, and you have had normal Pap tests for the past 10 years, you no longer need to have Pap tests.  If you have had past treatment for cervical cancer or a condition that could lead to cancer, you need Pap tests and screening for cancer for at least 20 years after your treatment.  If  you no longer get a Pap test, assess your risk factors if they change (such as having a new sexual partner). This can affect whether you should start being screened again.  Some women have medical problems that increase their chance of getting cervical cancer. If this is the case for you, your health care provider may recommend more frequent screening and Pap tests.  The human papillomavirus (HPV) test is another test that may be used for cervical cancer screening. The HPV test looks for the virus that can cause cell changes in the cervix. The cells collected during the Pap test can be tested for HPV.  The HPV test can be used to screen women 74 years of age and older. Getting tested for HPV can extend the interval between normal Pap tests from three to five years.  An HPV test also should be used to screen women of any age who have unclear Pap test results.  After 79 years of age, women should have HPV testing as often as Pap tests.  Colorectal Cancer  This type of cancer can be detected and often prevented.  Routine  colorectal cancer screening usually begins at 79 years of age and continues through 79 years of age.  Your health care provider may recommend screening at an earlier age if you have risk factors for colon cancer.  Your health care provider may also recommend using home test kits to check for hidden blood in the stool.  A small camera at the end of a tube can be used to examine your colon directly (sigmoidoscopy or colonoscopy). This is done to check for the earliest forms of colorectal cancer.  Routine screening usually begins at age 9.  Direct examination of the colon should be repeated every 5-10 years through 79 years of age. However, you may need to be screened more often if early forms of precancerous polyps or small growths are found. Skin Cancer  Check your skin from head to toe regularly.  Tell your health care provider about any new moles or changes in moles, especially if there is a change in a mole's shape or color.  Also tell your health care provider if you have a mole that is larger than the size of a pencil eraser.  Always use sunscreen. Apply sunscreen liberally and repeatedly throughout the day.  Protect yourself by wearing long sleeves, pants, a wide-brimmed hat, and sunglasses whenever you are outside. HEART DISEASE, DIABETES, AND HIGH BLOOD PRESSURE   Have your blood pressure checked at least every 1-2 years. High blood pressure causes heart disease and increases the risk of stroke.  If you are between 38 years and 47 years old, ask your health care provider if you should take aspirin to prevent strokes.  Have regular diabetes screenings. This involves taking a blood sample to check your fasting blood sugar level.  If you are at a normal weight and have a low risk for diabetes, have this test once every three years after 79 years of age.  If you are overweight and have a high risk for diabetes, consider being tested at a younger age or more often. PREVENTING  INFECTION  Hepatitis B  If you have a higher risk for hepatitis B, you should be screened for this virus. You are considered at high risk for hepatitis B if:  You were born in a country where hepatitis B is common. Ask your health care provider which countries are considered high risk.  Your parents were born  in a high-risk country, and you have not been immunized against hepatitis B (hepatitis B vaccine).  You have HIV or AIDS.  You use needles to inject street drugs.  You live with someone who has hepatitis B.  You have had sex with someone who has hepatitis B.  You get hemodialysis treatment.  You take certain medicines for conditions, including cancer, organ transplantation, and autoimmune conditions. Hepatitis C  Blood testing is recommended for:  Everyone born from [birth year] through 1963.  Anyone with known risk factors for hepatitis C. Sexually transmitted infections (STIs)  You should be screened for sexually transmitted infections (STIs) including gonorrhea and chlamydia if:  You are sexually active and are younger than 79 years of age.  You are older than 79 years of age and your health care provider tells you that you are at risk for this type of infection.  Your sexual activity has changed since you were last screened and you are at an increased risk for chlamydia or gonorrhea. Ask your health care provider if you are at risk.  If you do not have HIV, but are at risk, it may be recommended that you take a prescription medicine daily to prevent HIV infection. This is called pre-exposure prophylaxis (PrEP). You are considered at risk if:  You are sexually active and do not regularly use condoms or know the HIV status of your partner(s).  You take drugs by injection.  You are sexually active with a partner who has HIV. Talk with your health care provider about whether you are at high risk of being infected with HIV. If you choose to begin PrEP, you should first be  tested for HIV. You should then be tested every 3 months for as long as you are taking PrEP.  PREGNANCY   If you are premenopausal and you may become pregnant, ask your health care provider about preconception counseling.  If you may become pregnant, take 400 to 800 micrograms (mcg) of folic acid every day.  If you want to prevent pregnancy, talk to your health care provider about birth control (contraception). OSTEOPOROSIS AND MENOPAUSE   Osteoporosis is a disease in which the bones lose minerals and strength with aging. This can result in serious bone fractures. Your risk for osteoporosis can be identified using a bone density scan.  If you are 30 years of age or older, or if you are at risk for osteoporosis and fractures, ask your health care provider if you should be screened.  Ask your health care provider whether you should take a calcium or vitamin D supplement to lower your risk for osteoporosis.  Menopause may have certain physical symptoms and risks.  Hormone replacement therapy may reduce some of these symptoms and risks. Talk to your health care provider about whether hormone replacement therapy is right for you.  HOME CARE INSTRUCTIONS   Schedule regular health, dental, and eye exams.  Stay current with your immunizations.   Do not use any tobacco products including cigarettes, chewing tobacco, or electronic cigarettes.  If you are pregnant, do not drink alcohol.  If you are breastfeeding, limit how much and how often you drink alcohol.  Limit alcohol intake to no more than 1 drink per day for nonpregnant women. One drink equals 12 ounces of beer, 5 ounces of wine, or 1 ounces of hard liquor.  Do not use street drugs.  Do not share needles.  Ask your health care provider for help if you need support or information  about quitting drugs.  Tell your health care provider if you often feel depressed.  Tell your health care provider if you have ever been abused or  do not feel safe at home. Document Released: 10/18/2010 Document Revised: 08/19/2013 Document Reviewed: 03/06/2013 [facility] Patient Information 2015 [place], [place]. This information is not intended to replace advice given to you by your health care provider. Make sure you discuss any questions you have with your health care provider.

## 2014-12-24 ENCOUNTER — Other Ambulatory Visit: Payer: Self-pay | Admitting: Family Medicine

## 2014-12-25 NOTE — Telephone Encounter (Signed)
Last seen 05/23/14 and filled 10/14/14 #60 UDS 05/23/14 low risk   Please advise      KP

## 2015-01-20 ENCOUNTER — Ambulatory Visit (INDEPENDENT_AMBULATORY_CARE_PROVIDER_SITE_OTHER): Payer: Medicare Other | Admitting: Family Medicine

## 2015-01-20 ENCOUNTER — Encounter: Payer: Self-pay | Admitting: Family Medicine

## 2015-01-20 VITALS — BP 116/70 | HR 70 | Temp 97.7°F | Wt 169.0 lb

## 2015-01-20 DIAGNOSIS — H9203 Otalgia, bilateral: Secondary | ICD-10-CM

## 2015-01-20 DIAGNOSIS — I10 Essential (primary) hypertension: Secondary | ICD-10-CM | POA: Diagnosis not present

## 2015-01-20 DIAGNOSIS — M25562 Pain in left knee: Secondary | ICD-10-CM

## 2015-01-20 DIAGNOSIS — E785 Hyperlipidemia, unspecified: Secondary | ICD-10-CM

## 2015-01-20 DIAGNOSIS — R296 Repeated falls: Secondary | ICD-10-CM

## 2015-01-20 DIAGNOSIS — M25561 Pain in right knee: Secondary | ICD-10-CM

## 2015-01-20 LAB — COMPREHENSIVE METABOLIC PANEL
ALK PHOS: 66 U/L (ref 39–117)
ALT: 10 U/L (ref 0–35)
AST: 16 U/L (ref 0–37)
Albumin: 4.2 g/dL (ref 3.5–5.2)
BUN: 21 mg/dL (ref 6–23)
CO2: 30 mEq/L (ref 19–32)
Calcium: 9.7 mg/dL (ref 8.4–10.5)
Chloride: 104 mEq/L (ref 96–112)
Creatinine, Ser: 0.7 mg/dL (ref 0.40–1.20)
GFR: 85.15 mL/min (ref >60.00)
GLUCOSE: 100 mg/dL — AB (ref 70–99)
POTASSIUM: 3.7 meq/L (ref 3.5–5.1)
SODIUM: 141 meq/L (ref 135–145)
TOTAL PROTEIN: 7.4 g/dL (ref 6.0–8.3)
Total Bilirubin: 0.6 mg/dL (ref 0.2–1.2)

## 2015-01-20 LAB — LIPID PANEL
CHOL/HDL RATIO: 3
Cholesterol: 176 mg/dL (ref 0–200)
HDL: 53.4 mg/dL (ref >39.00)
LDL Cholesterol: 103 mg/dL — ABNORMAL HIGH (ref 0–99)
NONHDL: 122.11
Triglycerides: 96 mg/dL (ref 0.0–149.0)
VLDL: 19.2 mg/dL (ref 0.0–40.0)

## 2015-01-20 NOTE — Assessment & Plan Note (Signed)
Pt refuses pt evaluation She says she will use her walker from now on

## 2015-01-20 NOTE — Assessment & Plan Note (Signed)
con't micardis stable

## 2015-01-20 NOTE — Progress Notes (Signed)
Patient ID: [name], female    DOB: Dec 12, 1930  Age: 81 y.o. MRN: [id number]    Subjective:  Subjective HPI [name] presents for f/u bp and c/o 2 falls since the beginning of the year.  The last one occurred she thinks because the sun was so bright.   The first one occurred while getting out of bathtub.   She also is c/o b/l knee pain and thinks she needs to see Dr Alvan Dame again--- her knee replacements were done 9-10 years ago.   She is also c/o ear pain---  No congestion  Review of Systems  Constitutional: Negative for diaphoresis, appetite change, fatigue and unexpected weight change.  Eyes: Negative for pain, redness and visual disturbance.  Respiratory: Negative for cough, chest tightness, shortness of breath and wheezing.   Cardiovascular: Negative for chest pain, palpitations and leg swelling.  Endocrine: Negative for cold intolerance, heat intolerance, polydipsia, polyphagia and polyuria.  Genitourinary: Negative for dysuria, frequency and difficulty urinating.  Neurological: Negative for dizziness, light-headedness, numbness and headaches.    History Past Medical History  Diagnosis Date  . Hypertension   . Osteoporosis   . COPD (chronic obstructive pulmonary disease) ([place])   . Hyperlipidemia   . Anxiety   . Depression   . Breast cancer ([place]) 13 years ago    s/p tamoxifen   . Peptic ulcer disease   . Hiatal hernia   . Ulcerative colitis   . GERD (gastroesophageal reflux disease)   . Hiatal hernia   . Osteoarthritis   . Diverticulosis   . Esophageal stricture   . Internal hemorrhoids   . Gastric outlet obstruction   . Rheumatic heart disease   . Glaucoma     She has past surgical history that includes Cataract extraction (08/2006 and 10/2006); Total knee arthroplasty; Hemorrhoid surgery; Tonsillectomy; Abdominal hysterectomy; Joint replacement; Mastectomy (1995); Breast surgery; Breast reconstruction (2000); Laparoscopic Nissen fundoplication (N/A, 5/73/2202);  and Hiatal hernia repair (N/A, 09/03/2012).   Her family history includes Breast cancer in an other family member; Cirrhosis in her brother; Colon cancer (age of onset: 80) in her father; Coronary artery disease in her brother and sister; Crohn's disease in her brother; Diabetes in an other family member; Hypertension in an other family member; Liver disease in her son; Stroke (age of onset: 55) in her mother.She reports that she has never smoked. She has never used smokeless tobacco. She reports that she does not drink alcohol or use illicit drugs.  Current Outpatient Prescriptions on File Prior to Visit  Medication Sig Dispense Refill  . aspirin 81 MG tablet Take 81 mg by mouth daily.      [name] azelastine (ASTELIN) 137 MCG/SPRAY nasal spray Place 1 spray into the nose 2 (two) times daily as needed (for allergies). Use in each nostril as directed    . Biotin (BIOTIN ULTRA STRENGTH) 10 MG CAPS Take 1 capsule by mouth daily.    [name] escitalopram (LEXAPRO) 20 MG tablet TAKE 1 TABLET BY MOUTH EVERY DAY 30 tablet 5  . LORazepam (ATIVAN) 2 MG tablet TAKE 1/2 TABLET BY MOUTH EVERY 8 HOURS AS NEEDED FOR ANXIETY 60 tablet 0  . pantoprazole (PROTONIX) 40 MG tablet Take 1 tablet (40 mg total) by mouth daily. 30 tablet 11  . PATADAY 0.2 % SOLN Place 1 drop into both eyes daily as needed (eye allergies.).     [name] polyethylene glycol (MIRALAX / GLYCOLAX) packet Take 17 g by mouth daily.    [name]  RESTASIS 0.05 % ophthalmic emulsion Place 1 drop into both eyes at bedtime.     [name] telmisartan-hydrochlorothiazide (MICARDIS HCT) 40-12.5 MG per tablet TAKE 1 TABLET BY MOUTH EVERY DAY 30 tablet 5  . vitamin B-12 (CYANOCOBALAMIN) 1000 MCG tablet Take 1,000 mcg by mouth daily.       No current facility-administered medications on file prior to visit.     Objective:  Objective Physical Exam  Constitutional: She is oriented to person, place, and time. She appears well-developed and well-nourished.  HENT:  Head: Normocephalic and  atraumatic.  Right Ear: Hearing, tympanic membrane, external ear and ear canal normal.  Left Ear: Hearing, tympanic membrane, external ear and ear canal normal.  Nose: Nose normal.  Mouth/Throat: Oropharynx is clear and moist. No oropharyngeal exudate.  Eyes: Conjunctivae and EOM are normal.  Neck: Normal range of motion. Neck supple. No JVD present. Carotid bruit is not present. No thyromegaly present.  Cardiovascular: Normal rate, regular rhythm and normal heart sounds.   No murmur heard. Pulmonary/Chest: Effort normal and breath sounds normal. No respiratory distress. She has no wheezes. She has no rales. She exhibits no tenderness.  Musculoskeletal: She exhibits no edema.  Neurological: She is alert and oriented to person, place, and time.  Psychiatric: She has a normal mood and affect. Her behavior is normal.   BP 116/70 mmHg  Pulse 70  Temp(Src) 97.7 F (36.5 C) (Oral)  Wt 169 lb (76.658 kg)  SpO2 97% Wt Readings from Last 3 Encounters:  01/20/15 169 lb (76.658 kg)  12/19/14 168 lb 9.6 oz (76.476 kg)  07/25/14 175 lb (79.379 kg)     Lab Results  Component Value Date   WBC 5.5 02/20/2014   HGB 14.7 02/20/2014   HCT 45.0 02/20/2014   PLT 220.0 02/20/2014   GLUCOSE 100* 01/20/2015   CHOL 176 01/20/2015   TRIG 96.0 01/20/2015   HDL 53.40 01/20/2015   LDLCALC 103* 01/20/2015   ALT 10 01/20/2015   AST 16 01/20/2015   NA 141 01/20/2015   K 3.7 01/20/2015   CL 104 01/20/2015   CREATININE 0.70 01/20/2015   BUN 21 01/20/2015   CO2 30 01/20/2015   TSH 1.084 09/30/2011   INR 1.02 04/14/2010   HGBA1C 6.3 05/23/2014    Dg Chest 2 View  07/25/2014   CLINICAL DATA:  Cough for 2 weeks.  EXAM: CHEST  2 VIEW  COMPARISON:  Sep 04, 2013.  FINDINGS: The heart size and mediastinal contours are within normal limits. Both lungs are clear. No pneumothorax or pleural effusion is noted. Surgical clips are noted in right axillary region. Status post kyphoplasty of lower thoracic vertebral  body.  IMPRESSION: No active cardiopulmonary disease.   Electronically Signed   By: Marijo Conception, M.D.   On: 07/25/2014 15:48     Assessment & Plan:  Plan I am having Ms. Parkey maintain her aspirin, vitamin B-12, RESTASIS, PATADAY, azelastine, polyethylene glycol, Biotin, escitalopram, pantoprazole, telmisartan-hydrochlorothiazide, and LORazepam.  No orders of the defined types were placed in this encounter.    Problem List Items Addressed This Visit    Multiple falls    Pt refuses pt evaluation She says she will use her walker from now on      Hyperlipidemia    con't diet and check labs      HTN (hypertension) - Primary    con't micardis stable      Relevant Orders   Comp Met (CMET) (Completed)   Lipid panel (  Completed)    Other Visit Diagnoses    Knee pain, bilateral        Relevant Orders    AMB referral to orthopedics    Ear pain, bilateral         pt here for >30 min with more than 50% time face to face  Follow-up: Return in about 6 months (around 07/21/2015), or if symptoms worsen or fail to improve, for annual exam, fasting.  [name], DO

## 2015-01-20 NOTE — Assessment & Plan Note (Signed)
con't diet and check labs

## 2015-01-20 NOTE — Progress Notes (Signed)
Pre visit review using our clinic review tool, if applicable. No additional management support is needed unless otherwise documented below in the visit note. 

## 2015-01-20 NOTE — Patient Instructions (Addendum)
Hypertension Hypertension, commonly called high blood pressure, is when the force of blood pumping through your arteries is too strong. Your arteries are the blood vessels that carry blood from your heart throughout your body. A blood pressure reading consists of a higher number over a lower number, such as 110/72. The higher number (systolic) is the pressure inside your arteries when your heart pumps. The lower number (diastolic) is the pressure inside your arteries when your heart relaxes. Ideally you want your blood pressure below 120/80. Hypertension forces your heart to work harder to pump blood. Your arteries may become narrow or stiff. Having hypertension puts you at risk for heart disease, stroke, and other problems.  RISK FACTORS Some risk factors for high blood pressure are controllable. Others are not.  Risk factors you cannot control include:   Race. You may be at higher risk if you are African American.  Age. Risk increases with age.  Gender. Men are at higher risk than women before age 45 years. After age 65, women are at higher risk than men. Risk factors you can control include:  Not getting enough exercise or physical activity.  Being overweight.  Getting too much fat, sugar, calories, or salt in your diet.  Drinking too much alcohol. SIGNS AND SYMPTOMS Hypertension does not usually cause signs or symptoms. Extremely high blood pressure (hypertensive crisis) may cause headache, anxiety, shortness of breath, and nosebleed. DIAGNOSIS  To check if you have hypertension, your health care provider will measure your blood pressure while you are seated, with your arm held at the level of your heart. It should be measured at least twice using the same arm. Certain conditions can cause a difference in blood pressure between your right and left arms. A blood pressure reading that is higher than normal on one occasion does not mean that you need treatment. If one blood pressure reading  is high, ask your health care provider about having it checked again. TREATMENT  Treating high blood pressure includes making lifestyle changes and possibly taking medicine. Living a healthy lifestyle can help lower high blood pressure. You may need to change some of your habits. Lifestyle changes may include:  Following the DASH diet. This diet is high in fruits, vegetables, and whole grains. It is low in salt, red meat, and added sugars.  Getting at least 2 hours of brisk physical activity every week.  Losing weight if necessary.  Not smoking.  Limiting alcoholic beverages.  Learning ways to reduce stress. If lifestyle changes are not enough to get your blood pressure under control, your health care provider may prescribe medicine. You may need to take more than one. Work closely with your health care provider to understand the risks and benefits. HOME CARE INSTRUCTIONS  Have your blood pressure rechecked as directed by your health care provider.   Take medicines only as directed by your health care provider. Follow the directions carefully. Blood pressure medicines must be taken as prescribed. The medicine does not work as well when you skip doses. Skipping doses also puts you at risk for problems.   Do not smoke.   Monitor your blood pressure at home as directed by your health care provider. SEEK MEDICAL CARE IF:   You think you are having a reaction to medicines taken.  You have recurrent headaches or feel dizzy.  You have swelling in your ankles.  You have trouble with your vision. SEEK IMMEDIATE MEDICAL CARE IF:  You develop a severe headache or confusion.    You have unusual weakness, numbness, or feel faint.  You have severe chest or abdominal pain.  You vomit repeatedly.  You have trouble breathing. MAKE SURE YOU:   Understand these instructions.  Will watch your condition.  Will get help right away if you are not doing well or get worse. Document  Released: 04/04/2005 Document Revised: 08/19/2013 Document Reviewed: 01/25/2013 [facility] Patient Information 2015 [place], [place]. This information is not intended to replace advice given to you by your health care provider. Make sure you discuss any questions you have with your health care provider. Fall Prevention and Home Safety Falls cause injuries and can affect all age groups. It is possible to use preventive measures to significantly decrease the likelihood of falls. There are many simple measures which can make your home safer and prevent falls. OUTDOORS Repair cracks and edges of walkways and driveways. Remove high doorway thresholds. Trim shrubbery on the main path into your home. Have good outside lighting. Clear walkways of tools, rocks, debris, and clutter. Check that handrails are not broken and are securely fastened. Both sides of steps should have handrails. Have leaves, snow, and ice cleared regularly. Use sand or salt on walkways during winter months. In the garage, clean up grease or oil spills. BATHROOM Install night lights. Install grab bars by the toilet and in the tub and shower. Use non-skid mats or decals in the tub or shower. Place a plastic non-slip stool in the shower to sit on, if needed. Keep floors dry and clean up all water on the floor immediately. Remove soap buildup in the tub or shower on a regular basis. Secure bath mats with non-slip, double-sided rug tape. Remove throw rugs and tripping hazards from the floors. BEDROOMS Install night lights. Make sure a bedside light is easy to reach. Do not use oversized bedding. Keep a telephone by your bedside. Have a firm chair with side arms to use for getting dressed. Remove throw rugs and tripping hazards from the floor. KITCHEN Keep handles on pots and pans turned toward the center of the stove. Use back burners when possible. Clean up spills quickly and allow time for drying. Avoid walking on wet  floors. Avoid hot utensils and knives. Position shelves so they are not too high or low. Place commonly used objects within easy reach. If necessary, use a sturdy step stool with a grab bar when reaching. Keep electrical cables out of the way. Do not use floor polish or wax that makes floors slippery. If you must use wax, use non-skid floor wax. Remove throw rugs and tripping hazards from the floor. STAIRWAYS Never leave objects on stairs. Place handrails on both sides of stairways and use them. Fix any loose handrails. Make sure handrails on both sides of the stairways are as long as the stairs. Check carpeting to make sure it is firmly attached along stairs. Make repairs to worn or loose carpet promptly. Avoid placing throw rugs at the top or bottom of stairways, or properly secure the rug with carpet tape to prevent slippage. Get rid of throw rugs, if possible. Have an electrician put in a light switch at the top and bottom of the stairs. OTHER FALL PREVENTION TIPS Wear low-heel or rubber-soled shoes that are supportive and fit well. Wear closed toe shoes. When using a stepladder, make sure it is fully opened and both spreaders are firmly locked. Do not climb a closed stepladder. Add color or contrast paint or tape to grab bars and handrails in your  home. Place contrasting color strips on first and last steps. Learn and use mobility aids as needed. Install an electrical emergency response system. Turn on lights to avoid dark areas. Replace light bulbs that burn out immediately. Get light switches that glow. Arrange furniture to create clear pathways. Keep furniture in the same place. Firmly attach carpet with non-skid or double-sided tape. Eliminate uneven floor surfaces. Select a carpet pattern that does not visually hide the edge of steps. Be aware of all pets. OTHER HOME SAFETY TIPS Set the water temperature for 120 F (48.8 C). Keep emergency numbers on or near the telephone. Keep  smoke detectors on every level of the home and near sleeping areas. Document Released: 03/25/2002 Document Revised: 10/04/2011 Document Reviewed: 06/24/2011 [facility] Patient Information 2015 [place], [place]. This information is not intended to replace advice given to you by your health care provider. Make sure you discuss any questions you have with your health care provider.

## 2015-02-03 DIAGNOSIS — Z96652 Presence of left artificial knee joint: Secondary | ICD-10-CM | POA: Diagnosis not present

## 2015-02-03 DIAGNOSIS — Z471 Aftercare following joint replacement surgery: Secondary | ICD-10-CM | POA: Diagnosis not present

## 2015-02-03 DIAGNOSIS — Z96651 Presence of right artificial knee joint: Secondary | ICD-10-CM | POA: Diagnosis not present

## 2015-02-03 DIAGNOSIS — Z96653 Presence of artificial knee joint, bilateral: Secondary | ICD-10-CM | POA: Diagnosis not present

## 2015-02-05 DIAGNOSIS — Z961 Presence of intraocular lens: Secondary | ICD-10-CM | POA: Diagnosis not present

## 2015-02-05 DIAGNOSIS — H538 Other visual disturbances: Secondary | ICD-10-CM | POA: Diagnosis not present

## 2015-02-05 DIAGNOSIS — H04129 Dry eye syndrome of unspecified lacrimal gland: Secondary | ICD-10-CM | POA: Diagnosis not present

## 2015-02-05 DIAGNOSIS — H47019 Ischemic optic neuropathy, unspecified eye: Secondary | ICD-10-CM | POA: Diagnosis not present

## 2015-02-17 ENCOUNTER — Other Ambulatory Visit: Payer: Self-pay | Admitting: Family Medicine

## 2015-02-17 NOTE — Telephone Encounter (Signed)
Last seen 01/20/15 and filled 12/25/14 #60  Please advise      KP

## 2015-02-22 ENCOUNTER — Other Ambulatory Visit: Payer: Self-pay | Admitting: Family Medicine

## 2015-04-03 ENCOUNTER — Telehealth: Payer: Self-pay | Admitting: Family Medicine

## 2015-04-03 NOTE — Telephone Encounter (Signed)
Is she needing refills now or does she just want to change her pharmacy?

## 2015-04-03 NOTE — Telephone Encounter (Signed)
Pharmacy: [facility], [phone], [place] [address], [phone]  Reason for call: Pt wanting to change meds to [facility] for 04/19/15 to save money and order in 3 month supply

## 2015-04-06 NOTE — Telephone Encounter (Signed)
Just to change pharmacy. I told her to call 2-3 weeks ahead since they are mailed to her from the new pharmacy.

## 2015-04-06 NOTE — Telephone Encounter (Signed)
Pharmacy updated. ° °KP °

## 2015-04-22 ENCOUNTER — Other Ambulatory Visit: Payer: Self-pay | Admitting: Family Medicine

## 2015-04-23 NOTE — Telephone Encounter (Signed)
Last seen 01/20/15 and filled 02/17/15 #60  Please advise     KP

## 2015-05-01 ENCOUNTER — Telehealth: Payer: Self-pay | Admitting: Family Medicine

## 2015-05-01 MED ORDER — ESCITALOPRAM OXALATE 20 MG PO TABS: 20.0000 mg | ORAL_TABLET | Freq: Every day | ORAL | Status: DC

## 2015-05-01 MED ORDER — TELMISARTAN-HCTZ 40-12.5 MG PO TABS: 1.0000 | ORAL_TABLET | Freq: Every day | ORAL | Status: DC

## 2015-05-01 NOTE — Telephone Encounter (Signed)
Caller name: Anuska  Relationship to patient: Self  Can be reached: [phone]  Pharmacy: [facility], [place], [place] [address], [phone]  Reason for call: Pt is requesting a refill on 2 medications.  1. Lexapro. 2. Telmisartan

## 2015-05-01 NOTE — Telephone Encounter (Signed)
Med's faxed.    KP 

## 2015-05-09 ENCOUNTER — Encounter: Payer: Self-pay | Admitting: Gastroenterology

## 2015-05-27 ENCOUNTER — Encounter: Payer: Self-pay | Admitting: Internal Medicine

## 2015-06-29 ENCOUNTER — Telehealth: Payer: Self-pay | Admitting: Family Medicine

## 2015-06-29 NOTE — Telephone Encounter (Signed)
Caller name: Self  Can be reached: [phone]   Reason for call: Request refill on LORazepam (ATIVAN) 2 MG tablet [id number]

## 2015-06-30 MED ORDER — LORAZEPAM 2 MG PO TABS: ORAL_TABLET | ORAL | Status: DC

## 2015-06-30 NOTE — Telephone Encounter (Signed)
Last seen 01/20/15 and filled 04/23/15 #60  Please advise    KP

## 2015-06-30 NOTE — Telephone Encounter (Signed)
Refill x1 

## 2015-06-30 NOTE — Telephone Encounter (Signed)
Rx faxed.    KP 

## 2015-07-14 ENCOUNTER — Ambulatory Visit: Payer: Medicare Other | Admitting: Family Medicine

## 2015-07-20 ENCOUNTER — Encounter: Payer: Self-pay | Admitting: Behavioral Health

## 2015-07-20 ENCOUNTER — Telehealth: Payer: Self-pay | Admitting: Behavioral Health

## 2015-07-20 NOTE — Addendum Note (Signed)
Addended by: Kathlen Brunswick on: 07/20/2015 02:48 PM   Modules accepted: Orders, Medications

## 2015-07-20 NOTE — Telephone Encounter (Signed)
Unable to reach patient at time of Pre-Visit Call.  Left message for patient to return call when available.    

## 2015-07-20 NOTE — Telephone Encounter (Signed)
Pre-Visit Call completed with patient and chart updated.   Pre-Visit Info documented in Specialty Comments under [employer].    

## 2015-07-21 ENCOUNTER — Ambulatory Visit (HOSPITAL_BASED_OUTPATIENT_CLINIC_OR_DEPARTMENT_OTHER)
Admission: RE | Admit: 2015-07-21 | Discharge: 2015-07-21 | Disposition: A | Discharge status: Home / Self Care | Payer: Medicare Other | Source: Ambulatory Visit | Attending: Family Medicine | Admitting: Family Medicine

## 2015-07-21 ENCOUNTER — Other Ambulatory Visit: Payer: Self-pay | Admitting: Family Medicine

## 2015-07-21 ENCOUNTER — Encounter: Payer: Self-pay | Admitting: Family Medicine

## 2015-07-21 ENCOUNTER — Ambulatory Visit (INDEPENDENT_AMBULATORY_CARE_PROVIDER_SITE_OTHER): Payer: Medicare Other | Admitting: Family Medicine

## 2015-07-21 VITALS — BP 128/80 | HR 75 | Temp 98.0°F | Ht 64.75 in | Wt 173.4 lb

## 2015-07-21 DIAGNOSIS — E2839 Other primary ovarian failure: Secondary | ICD-10-CM

## 2015-07-21 DIAGNOSIS — E785 Hyperlipidemia, unspecified: Secondary | ICD-10-CM | POA: Diagnosis not present

## 2015-07-21 DIAGNOSIS — G4700 Insomnia, unspecified: Secondary | ICD-10-CM

## 2015-07-21 DIAGNOSIS — F411 Generalized anxiety disorder: Secondary | ICD-10-CM | POA: Diagnosis not present

## 2015-07-21 DIAGNOSIS — Z780 Asymptomatic menopausal state: Secondary | ICD-10-CM | POA: Diagnosis not present

## 2015-07-21 DIAGNOSIS — R05 Cough: Secondary | ICD-10-CM

## 2015-07-21 DIAGNOSIS — I10 Essential (primary) hypertension: Secondary | ICD-10-CM | POA: Diagnosis not present

## 2015-07-21 DIAGNOSIS — R222 Localized swelling, mass and lump, trunk: Secondary | ICD-10-CM

## 2015-07-21 DIAGNOSIS — R918 Other nonspecific abnormal finding of lung field: Secondary | ICD-10-CM | POA: Diagnosis not present

## 2015-07-21 DIAGNOSIS — Z0000 Encounter for general adult medical examination without abnormal findings: Secondary | ICD-10-CM

## 2015-07-21 DIAGNOSIS — R059 Cough, unspecified: Secondary | ICD-10-CM

## 2015-07-21 DIAGNOSIS — M810 Age-related osteoporosis without current pathological fracture: Secondary | ICD-10-CM | POA: Insufficient documentation

## 2015-07-21 LAB — LIPID PANEL
CHOL/HDL RATIO: 4
Cholesterol: 211 mg/dL — ABNORMAL HIGH (ref 0–200)
HDL: 54 mg/dL (ref >39.00)
LDL CALC: 128 mg/dL — AB (ref 0–99)
NONHDL: 157.49
Triglycerides: 147 mg/dL (ref 0.0–149.0)
VLDL: 29.4 mg/dL (ref 0.0–40.0)

## 2015-07-21 LAB — CBC WITH DIFFERENTIAL/PLATELET
BASOS ABS: 0 10*3/uL (ref 0.0–0.1)
Basophils Relative: 0.3 % (ref 0.0–3.0)
Eosinophils Absolute: 0.1 10*3/uL (ref 0.0–0.7)
Eosinophils Relative: 2.1 % (ref 0.0–5.0)
HEMATOCRIT: 43 % (ref 36.0–46.0)
HEMOGLOBIN: 14.3 g/dL (ref 12.0–15.0)
LYMPHS PCT: 26.9 % (ref 12.0–46.0)
Lymphs Abs: 1.7 10*3/uL (ref 0.7–4.0)
MCHC: 33.1 g/dL (ref 30.0–36.0)
MCV: 96.8 fl (ref 78.0–100.0)
MONOS PCT: 4.8 % (ref 3.0–12.0)
Monocytes Absolute: 0.3 10*3/uL (ref 0.1–1.0)
NEUTROS ABS: 4.1 10*3/uL (ref 1.4–7.7)
Neutrophils Relative %: 65.9 % (ref 43.0–77.0)
PLATELETS: 245 10*3/uL (ref 150.0–400.0)
RBC: 4.44 Mil/uL (ref 3.87–5.11)
RDW: 13.3 % (ref 11.5–15.5)
WBC: 6.3 10*3/uL (ref 4.0–10.5)

## 2015-07-21 LAB — COMPREHENSIVE METABOLIC PANEL
ALBUMIN: 4.3 g/dL (ref 3.5–5.2)
ALT: 12 U/L (ref 0–35)
AST: 17 U/L (ref 0–37)
Alkaline Phosphatase: 80 U/L (ref 39–117)
BUN: 19 mg/dL (ref 6–23)
CALCIUM: 9.8 mg/dL (ref 8.4–10.5)
CHLORIDE: 100 meq/L (ref 96–112)
CO2: 31 meq/L (ref 19–32)
Creatinine, Ser: 0.7 mg/dL (ref 0.40–1.20)
GFR: 85.04 mL/min (ref >60.00)
Glucose, Bld: 117 mg/dL — ABNORMAL HIGH (ref 70–99)
POTASSIUM: 3.7 meq/L (ref 3.5–5.1)
Sodium: 138 mEq/L (ref 135–145)
Total Bilirubin: 0.7 mg/dL (ref 0.2–1.2)
Total Protein: 7.6 g/dL (ref 6.0–8.3)

## 2015-07-21 LAB — POCT URINALYSIS DIPSTICK
BILIRUBIN UA: NEGATIVE
Glucose, UA: NEGATIVE
KETONES UA: NEGATIVE
Leukocytes, UA: NEGATIVE
NITRITE UA: NEGATIVE
PH UA: 7.5
Protein, UA: NEGATIVE
RBC UA: NEGATIVE
SPEC GRAV UA: 1.015
Urobilinogen, UA: 0.2

## 2015-07-21 MED ORDER — TRAZODONE HCL 50 MG PO TABS: 25.0000 mg | ORAL_TABLET | Freq: Every evening | ORAL | Status: DC | PRN

## 2015-07-21 MED ORDER — LORAZEPAM 2 MG PO TABS: ORAL_TABLET | ORAL | Status: DC

## 2015-07-21 NOTE — Progress Notes (Signed)
Pre visit review using our clinic review tool, if applicable. No additional management support is needed unless otherwise documented below in the visit note. 

## 2015-07-21 NOTE — Patient Instructions (Signed)
Preventive Care for Adults, Female A healthy lifestyle and preventive care can promote health and wellness. Preventive health guidelines for women include the following key practices.  A routine yearly physical is a good way to check with your health care provider about your health and preventive screening. It is a chance to share any concerns and updates on your health and to receive a thorough exam.  Visit your dentist for a routine exam and preventive care every 6 months. Brush your teeth twice a day and floss once a day. Good oral hygiene prevents tooth decay and gum disease.  The frequency of eye exams is based on your age, health, family medical history, use of contact lenses, and other factors. Follow your health care provider's recommendations for frequency of eye exams.  Eat a healthy diet. Foods like vegetables, fruits, whole grains, low-fat dairy products, and lean protein foods contain the nutrients you need without too many calories. Decrease your intake of foods high in solid fats, added sugars, and salt. Eat the right amount of calories for you.Get information about a proper diet from your health care provider, if necessary.  Regular physical exercise is one of the most important things you can do for your health. Most adults should get at least 150 minutes of moderate-intensity exercise (any activity that increases your heart rate and causes you to sweat) each week. In addition, most adults need muscle-strengthening exercises on 2 or more days a week.  Maintain a healthy weight. The body mass index (BMI) is a screening tool to identify possible weight problems. It provides an estimate of body fat based on height and weight. Your health care provider can find your BMI and can help you achieve or maintain a healthy weight.For adults 20 years and older:  A BMI below 18.5 is considered underweight.  A BMI of 18.5 to 24.9 is normal.  A BMI of 25 to 29.9 is considered overweight.  A  BMI of 30 and above is considered obese.  Maintain normal blood lipids and cholesterol levels by exercising and minimizing your intake of saturated fat. Eat a balanced diet with plenty of fruit and vegetables. Blood tests for lipids and cholesterol should begin at age 45 and be repeated every 5 years. If your lipid or cholesterol levels are high, you are over 50, or you are at high risk for heart disease, you may need your cholesterol levels checked more frequently.Ongoing high lipid and cholesterol levels should be treated with medicines if diet and exercise are not working.  If you smoke, find out from your health care provider how to quit. If you do not use tobacco, do not start.  Lung cancer screening is recommended for adults aged 45-80 years who are at high risk for developing lung cancer because of a history of smoking. A yearly low-dose CT scan of the lungs is recommended for people who have at least a 30-pack-year history of smoking and are a current smoker or have quit within the past 15 years. A pack year of smoking is smoking an average of 1 pack of cigarettes a day for 1 year (for example: 1 pack a day for 30 years or 2 packs a day for 15 years). Yearly screening should continue until the smoker has stopped smoking for at least 15 years. Yearly screening should be stopped for people who develop a health problem that would prevent them from having lung cancer treatment.  If you are pregnant, do not drink alcohol. If you are  breastfeeding, be very cautious about drinking alcohol. If you are not pregnant and choose to drink alcohol, do not have more than 1 drink per day. One drink is considered to be 12 ounces (355 mL) of beer, 5 ounces (148 mL) of wine, or 1.5 ounces (44 mL) of liquor.  Avoid use of street drugs. Do not share needles with anyone. Ask for help if you need support or instructions about stopping the use of drugs.  High blood pressure causes heart disease and increases the risk  of stroke. Your blood pressure should be checked at least every 1 to 2 years. Ongoing high blood pressure should be treated with medicines if weight loss and exercise do not work.  If you are 55-79 years old, ask your health care provider if you should take aspirin to prevent strokes.  Diabetes screening is done by taking a blood sample to check your blood glucose level after you have not eaten for a certain period of time (fasting). If you are not overweight and you do not have risk factors for diabetes, you should be screened once every 3 years starting at age 45. If you are overweight or obese and you are 40-70 years of age, you should be screened for diabetes every year as part of your cardiovascular risk assessment.  Breast cancer screening is essential preventive care for women. You should practice "breast self-awareness." This means understanding the normal appearance and feel of your breasts and may include breast self-examination. Any changes detected, no matter how small, should be reported to a health care provider. Women in their 20s and 30s should have a clinical breast exam (CBE) by a health care provider as part of a regular health exam every 1 to 3 years. After age 40, women should have a CBE every year. Starting at age 40, women should consider having a mammogram (breast X-ray test) every year. Women who have a family history of breast cancer should talk to their health care provider about genetic screening. Women at a high risk of breast cancer should talk to their health care providers about having an MRI and a mammogram every year.  Breast cancer gene (BRCA)-related cancer risk assessment is recommended for women who have family members with BRCA-related cancers. BRCA-related cancers include breast, ovarian, tubal, and peritoneal cancers. Having family members with these cancers may be associated with an increased risk for harmful changes (mutations) in the breast cancer genes BRCA1 and  BRCA2. Results of the assessment will determine the need for genetic counseling and BRCA1 and BRCA2 testing.  Your health care provider may recommend that you be screened regularly for cancer of the pelvic organs (ovaries, uterus, and vagina). This screening involves a pelvic examination, including checking for microscopic changes to the surface of your cervix (Pap test). You may be encouraged to have this screening done every 3 years, beginning at age 21.  For women ages 30-65, health care providers may recommend pelvic exams and Pap testing every 3 years, or they may recommend the Pap and pelvic exam, combined with testing for human papilloma virus (HPV), every 5 years. Some types of HPV increase your risk of cervical cancer. Testing for HPV may also be done on women of any age with unclear Pap test results.  Other health care providers may not recommend any screening for nonpregnant women who are considered low risk for pelvic cancer and who do not have symptoms. Ask your health care provider if a screening pelvic exam is right for   you.  If you have had past treatment for cervical cancer or a condition that could lead to cancer, you need Pap tests and screening for cancer for at least 20 years after your treatment. If Pap tests have been discontinued, your risk factors (such as having a new sexual partner) need to be reassessed to determine if screening should resume. Some women have medical problems that increase the chance of getting cervical cancer. In these cases, your health care provider may recommend more frequent screening and Pap tests.  Colorectal cancer can be detected and often prevented. Most routine colorectal cancer screening begins at the age of 50 years and continues through age 75 years. However, your health care provider may recommend screening at an earlier age if you have risk factors for colon cancer. On a yearly basis, your health care provider may provide home test kits to check  for hidden blood in the stool. Use of a small camera at the end of a tube, to directly examine the colon (sigmoidoscopy or colonoscopy), can detect the earliest forms of colorectal cancer. Talk to your health care provider about this at age 50, when routine screening begins. Direct exam of the colon should be repeated every 5-10 years through age 75 years, unless early forms of precancerous polyps or small growths are found.  People who are at an increased risk for hepatitis B should be screened for this virus. You are considered at high risk for hepatitis B if:  You were born in a country where hepatitis B occurs often. Talk with your health care provider about which countries are considered high risk.  Your parents were born in a high-risk country and you have not received a shot to protect against hepatitis B (hepatitis B vaccine).  You have HIV or AIDS.  You use needles to inject street drugs.  You live with, or have sex with, someone who has hepatitis B.  You get hemodialysis treatment.  You take certain medicines for conditions like cancer, organ transplantation, and autoimmune conditions.  Hepatitis C blood testing is recommended for all people born from 1945 through 1965 and any individual with known risks for hepatitis C.  Practice safe sex. Use condoms and avoid high-risk sexual practices to reduce the spread of sexually transmitted infections (STIs). STIs include gonorrhea, chlamydia, syphilis, trichomonas, herpes, HPV, and human immunodeficiency virus (HIV). Herpes, HIV, and HPV are viral illnesses that have no cure. They can result in disability, cancer, and death.  You should be screened for sexually transmitted illnesses (STIs) including gonorrhea and chlamydia if:  You are sexually active and are younger than 24 years.  You are older than 24 years and your health care provider tells you that you are at risk for this type of infection.  Your sexual activity has changed  since you were last screened and you are at an increased risk for chlamydia or gonorrhea. Ask your health care provider if you are at risk.  If you are at risk of being infected with HIV, it is recommended that you take a prescription medicine daily to prevent HIV infection. This is called preexposure prophylaxis (PrEP). You are considered at risk if:  You are sexually active and do not regularly use condoms or know the HIV status of your partner(s).  You take drugs by injection.  You are sexually active with a partner who has HIV.  Talk with your health care provider about whether you are at high risk of being infected with HIV. If   you choose to begin PrEP, you should first be tested for HIV. You should then be tested every 3 months for as long as you are taking PrEP.  Osteoporosis is a disease in which the bones lose minerals and strength with aging. This can result in serious bone fractures or breaks. The risk of osteoporosis can be identified using a bone density scan. Women ages 67 years and over and women at risk for fractures or osteoporosis should discuss screening with their health care providers. Ask your health care provider whether you should take a calcium supplement or vitamin D to reduce the rate of osteoporosis.  Menopause can be associated with physical symptoms and risks. Hormone replacement therapy is available to decrease symptoms and risks. You should talk to your health care provider about whether hormone replacement therapy is right for you.  Use sunscreen. Apply sunscreen liberally and repeatedly throughout the day. You should seek shade when your shadow is shorter than you. Protect yourself by wearing long sleeves, pants, a wide-brimmed hat, and sunglasses year round, whenever you are outdoors.  Once a month, do a whole body skin exam, using a mirror to look at the skin on your back. Tell your health care provider of new moles, moles that have irregular borders, moles that  are larger than a pencil eraser, or moles that have changed in shape or color.  Stay current with required vaccines (immunizations).  Influenza vaccine. All adults should be immunized every year.  Tetanus, diphtheria, and acellular pertussis (Td, Tdap) vaccine. Pregnant women should receive 1 dose of Tdap vaccine during each pregnancy. The dose should be obtained regardless of the length of time since the last dose. Immunization is preferred during the 27th-36th week of gestation. An adult who has not previously received Tdap or who does not know her vaccine status should receive 1 dose of Tdap. This initial dose should be followed by tetanus and diphtheria toxoids (Td) booster doses every 10 years. Adults with an unknown or incomplete history of completing a 3-dose immunization series with Td-containing vaccines should begin or complete a primary immunization series including a Tdap dose. Adults should receive a Td booster every 10 years.  Varicella vaccine. An adult without evidence of immunity to varicella should receive 2 doses or a second dose if she has previously received 1 dose. Pregnant females who do not have evidence of immunity should receive the first dose after pregnancy. This first dose should be obtained before leaving the health care facility. The second dose should be obtained 4-8 weeks after the first dose.  Human papillomavirus (HPV) vaccine. Females aged 13-26 years who have not received the vaccine previously should obtain the 3-dose series. The vaccine is not recommended for use in pregnant females. However, pregnancy testing is not needed before receiving a dose. If a female is found to be pregnant after receiving a dose, no treatment is needed. In that case, the remaining doses should be delayed until after the pregnancy. Immunization is recommended for any person with an immunocompromised condition through the age of 61 years if she did not get any or all doses earlier. During the  3-dose series, the second dose should be obtained 4-8 weeks after the first dose. The third dose should be obtained 24 weeks after the first dose and 16 weeks after the second dose.  Zoster vaccine. One dose is recommended for adults aged 30 years or older unless certain conditions are present.  Measles, mumps, and rubella (MMR) vaccine. Adults born  before 1957 generally are considered immune to measles and mumps. Adults born in 1957 or later should have 1 or more doses of MMR vaccine unless there is a contraindication to the vaccine or there is laboratory evidence of immunity to each of the three diseases. A routine second dose of MMR vaccine should be obtained at least 28 days after the first dose for students attending postsecondary schools, health care workers, or international travelers. People who received inactivated measles vaccine or an unknown type of measles vaccine during 1963-1967 should receive 2 doses of MMR vaccine. People who received inactivated mumps vaccine or an unknown type of mumps vaccine before 1979 and are at high risk for mumps infection should consider immunization with 2 doses of MMR vaccine. For females of childbearing age, rubella immunity should be determined. If there is no evidence of immunity, females who are not pregnant should be vaccinated. If there is no evidence of immunity, females who are pregnant should delay immunization until after pregnancy. Unvaccinated health care workers born before 1957 who lack laboratory evidence of measles, mumps, or rubella immunity or laboratory confirmation of disease should consider measles and mumps immunization with 2 doses of MMR vaccine or rubella immunization with 1 dose of MMR vaccine.  Pneumococcal 13-valent conjugate (PCV13) vaccine. When indicated, a person who is uncertain of his immunization history and has no record of immunization should receive the PCV13 vaccine. All adults 65 years of age and older should receive this  vaccine. An adult aged 19 years or older who has certain medical conditions and has not been previously immunized should receive 1 dose of PCV13 vaccine. This PCV13 should be followed with a dose of pneumococcal polysaccharide (PPSV23) vaccine. Adults who are at high risk for pneumococcal disease should obtain the PPSV23 vaccine at least 8 weeks after the dose of PCV13 vaccine. Adults older than 80 years of age who have normal immune system function should obtain the PPSV23 vaccine dose at least 1 year after the dose of PCV13 vaccine.  Pneumococcal polysaccharide (PPSV23) vaccine. When PCV13 is also indicated, PCV13 should be obtained first. All adults aged 65 years and older should be immunized. An adult younger than age 65 years who has certain medical conditions should be immunized. Any person who resides in a nursing home or long-term care facility should be immunized. An adult smoker should be immunized. People with an immunocompromised condition and certain other conditions should receive both PCV13 and PPSV23 vaccines. People with human immunodeficiency virus (HIV) infection should be immunized as soon as possible after diagnosis. Immunization during chemotherapy or radiation therapy should be avoided. Routine use of PPSV23 vaccine is not recommended for American Indians, [place] Natives, or people younger than 65 years unless there are medical conditions that require PPSV23 vaccine. When indicated, people who have unknown immunization and have no record of immunization should receive PPSV23 vaccine. One-time revaccination 5 years after the first dose of PPSV23 is recommended for people aged 19-64 years who have chronic kidney failure, nephrotic syndrome, asplenia, or immunocompromised conditions. People who received 1-2 doses of PPSV23 before age 65 years should receive another dose of PPSV23 vaccine at age 65 years or later if at least 5 years have passed since the previous dose. Doses of PPSV23 are not  needed for people immunized with PPSV23 at or after age 65 years.  Meningococcal vaccine. Adults with asplenia or persistent complement component deficiencies should receive 2 doses of quadrivalent meningococcal conjugate (MenACWY-D) vaccine. The doses should be obtained   at least 2 months apart. Microbiologists working with certain meningococcal bacteria, [place] recruits, people at risk during an outbreak, and people who travel to or live in countries with a high rate of meningitis should be immunized. A first-year college student up through age 34 years who is living in a residence hall should receive a dose if she did not receive a dose on or after her 16th birthday. Adults who have certain high-risk conditions should receive one or more doses of vaccine.  Hepatitis A vaccine. Adults who wish to be protected from this disease, have certain high-risk conditions, work with hepatitis A-infected animals, work in hepatitis A research labs, or travel to or work in countries with a high rate of hepatitis A should be immunized. Adults who were previously unvaccinated and who anticipate close contact with an international adoptee during the first 60 days after arrival in the [place] from a country with a high rate of hepatitis A should be immunized.  Hepatitis B vaccine. Adults who wish to be protected from this disease, have certain high-risk conditions, may be exposed to blood or other infectious body fluids, are household contacts or sex partners of hepatitis B positive people, are clients or workers in certain care facilities, or travel to or work in countries with a high rate of hepatitis B should be immunized.  Haemophilus influenzae type b (Hib) vaccine. A previously unvaccinated person with asplenia or sickle cell disease or having a scheduled splenectomy should receive 1 dose of Hib vaccine. Regardless of previous immunization, a recipient of a hematopoietic stem cell transplant should receive a  3-dose series 6-12 months after her successful transplant. Hib vaccine is not recommended for adults with HIV infection. Preventive Services / Frequency Ages 35 to 4 years  Blood pressure check.** / Every 3-5 years.  Lipid and cholesterol check.** / Every 5 years beginning at age 60.  Clinical breast exam.** / Every 3 years for women in their 71s and 10s.  BRCA-related cancer risk assessment.** / For women who have family members with a BRCA-related cancer (breast, ovarian, tubal, or peritoneal cancers).  Pap test.** / Every 2 years from ages 76 through 26. Every 3 years starting at age 61 through age 76 or 93 with a history of 3 consecutive normal Pap tests.  HPV screening.** / Every 3 years from ages 37 through ages 60 to 51 with a history of 3 consecutive normal Pap tests.  Hepatitis C blood test.** / For any individual with known risks for hepatitis C.  Skin self-exam. / Monthly.  Influenza vaccine. / Every year.  Tetanus, diphtheria, and acellular pertussis (Tdap, Td) vaccine.** / Consult your health care provider. Pregnant women should receive 1 dose of Tdap vaccine during each pregnancy. 1 dose of Td every 10 years.  Varicella vaccine.** / Consult your health care provider. Pregnant females who do not have evidence of immunity should receive the first dose after pregnancy.  HPV vaccine. / 3 doses over 6 months, if 93 and younger. The vaccine is not recommended for use in pregnant females. However, pregnancy testing is not needed before receiving a dose.  Measles, mumps, rubella (MMR) vaccine.** / You need at least 1 dose of MMR if you were born in 1957 or later. You may also need a 2nd dose. For females of childbearing age, rubella immunity should be determined. If there is no evidence of immunity, females who are not pregnant should be vaccinated. If there is no evidence of immunity, females who are  pregnant should delay immunization until after pregnancy.  Pneumococcal  13-valent conjugate (PCV13) vaccine.** / Consult your health care provider.  Pneumococcal polysaccharide (PPSV23) vaccine.** / 1 to 2 doses if you smoke cigarettes or if you have certain conditions.  Meningococcal vaccine.** / 1 dose if you are age 68 to 8 years and a Market researcher living in a residence hall, or have one of several medical conditions, you need to get vaccinated against meningococcal disease. You may also need additional booster doses.  Hepatitis A vaccine.** / Consult your health care provider.  Hepatitis B vaccine.** / Consult your health care provider.  Haemophilus influenzae type b (Hib) vaccine.** / Consult your health care provider. Ages 7 to 53 years  Blood pressure check.** / Every year.  Lipid and cholesterol check.** / Every 5 years beginning at age 25 years.  Lung cancer screening. / Every year if you are aged 11-80 years and have a 30-pack-year history of smoking and currently smoke or have quit within the past 15 years. Yearly screening is stopped once you have quit smoking for at least 15 years or develop a health problem that would prevent you from having lung cancer treatment.  Clinical breast exam.** / Every year after age 48 years.  BRCA-related cancer risk assessment.** / For women who have family members with a BRCA-related cancer (breast, ovarian, tubal, or peritoneal cancers).  Mammogram.** / Every year beginning at age 41 years and continuing for as long as you are in good health. Consult with your health care provider.  Pap test.** / Every 3 years starting at age 65 years through age 37 or 70 years with a history of 3 consecutive normal Pap tests.  HPV screening.** / Every 3 years from ages 72 years through ages 60 to 40 years with a history of 3 consecutive normal Pap tests.  Fecal occult blood test (FOBT) of stool. / Every year beginning at age 21 years and continuing until age 5 years. You may not need to do this test if you get  a colonoscopy every 10 years.  Flexible sigmoidoscopy or colonoscopy.** / Every 5 years for a flexible sigmoidoscopy or every 10 years for a colonoscopy beginning at age 35 years and continuing until age 48 years.  Hepatitis C blood test.** / For all people born from [birth year] through 1965 and any individual with known risks for hepatitis C.  Skin self-exam. / Monthly.  Influenza vaccine. / Every year.  Tetanus, diphtheria, and acellular pertussis (Tdap/Td) vaccine.** / Consult your health care provider. Pregnant women should receive 1 dose of Tdap vaccine during each pregnancy. 1 dose of Td every 10 years.  Varicella vaccine.** / Consult your health care provider. Pregnant females who do not have evidence of immunity should receive the first dose after pregnancy.  Zoster vaccine.** / 1 dose for adults aged 30 years or older.  Measles, mumps, rubella (MMR) vaccine.** / You need at least 1 dose of MMR if you were born in 1957 or later. You may also need a second dose. For females of childbearing age, rubella immunity should be determined. If there is no evidence of immunity, females who are not pregnant should be vaccinated. If there is no evidence of immunity, females who are pregnant should delay immunization until after pregnancy.  Pneumococcal 13-valent conjugate (PCV13) vaccine.** / Consult your health care provider.  Pneumococcal polysaccharide (PPSV23) vaccine.** / 1 to 2 doses if you smoke cigarettes or if you have certain conditions.  Meningococcal vaccine.** /  Consult your health care provider.  Hepatitis A vaccine.** / Consult your health care provider.  Hepatitis B vaccine.** / Consult your health care provider.  Haemophilus influenzae type b (Hib) vaccine.** / Consult your health care provider. Ages 64 years and over  Blood pressure check.** / Every year.  Lipid and cholesterol check.** / Every 5 years beginning at age 23 years.  Lung cancer screening. / Every year if you  are aged 16-80 years and have a 30-pack-year history of smoking and currently smoke or have quit within the past 15 years. Yearly screening is stopped once you have quit smoking for at least 15 years or develop a health problem that would prevent you from having lung cancer treatment.  Clinical breast exam.** / Every year after age 74 years.  BRCA-related cancer risk assessment.** / For women who have family members with a BRCA-related cancer (breast, ovarian, tubal, or peritoneal cancers).  Mammogram.** / Every year beginning at age 44 years and continuing for as long as you are in good health. Consult with your health care provider.  Pap test.** / Every 3 years starting at age 58 years through age 22 or 39 years with 3 consecutive normal Pap tests. Testing can be stopped between 65 and 70 years with 3 consecutive normal Pap tests and no abnormal Pap or HPV tests in the past 10 years.  HPV screening.** / Every 3 years from ages 64 years through ages 70 or 61 years with a history of 3 consecutive normal Pap tests. Testing can be stopped between 65 and 70 years with 3 consecutive normal Pap tests and no abnormal Pap or HPV tests in the past 10 years.  Fecal occult blood test (FOBT) of stool. / Every year beginning at age 40 years and continuing until age 27 years. You may not need to do this test if you get a colonoscopy every 10 years.  Flexible sigmoidoscopy or colonoscopy.** / Every 5 years for a flexible sigmoidoscopy or every 10 years for a colonoscopy beginning at age 7 years and continuing until age 32 years.  Hepatitis C blood test.** / For all people born from [birth year] through 1963 and any individual with known risks for hepatitis C.  Osteoporosis screening.** / A one-time screening for women ages 30 years and over and women at risk for fractures or osteoporosis.  Skin self-exam. / Monthly.  Influenza vaccine. / Every year.  Tetanus, diphtheria, and acellular pertussis (Tdap/Td)  vaccine.** / 1 dose of Td every 10 years.  Varicella vaccine.** / Consult your health care provider.  Zoster vaccine.** / 1 dose for adults aged 35 years or older.  Pneumococcal 13-valent conjugate (PCV13) vaccine.** / Consult your health care provider.  Pneumococcal polysaccharide (PPSV23) vaccine.** / 1 dose for all adults aged 46 years and older.  Meningococcal vaccine.** / Consult your health care provider.  Hepatitis A vaccine.** / Consult your health care provider.  Hepatitis B vaccine.** / Consult your health care provider.  Haemophilus influenzae type b (Hib) vaccine.** / Consult your health care provider. ** Family history and personal history of risk and conditions may change your health care provider's recommendations.   This information is not intended to replace advice given to you by your health care provider. Make sure you discuss any questions you have with your health care provider.   Document Released: 05/31/2001 Document Revised: 04/25/2014 Document Reviewed: 08/30/2010 Elsevier Interactive Patient Education [employer].

## 2015-07-21 NOTE — Progress Notes (Signed)
Subjective:   [name] is a 82 y.o. female who presents for Medicare Annual (Subsequent) preventive examination.  Review of Systems:   Review of Systems  Constitutional: Negative for activity change, appetite change and fatigue.  HENT: Negative for hearing loss, congestion, tinnitus and ear discharge.   Eyes: Negative for visual disturbance (see optho q1y -- vision corrected to 20/20 with glasses).  Respiratory: Negative for cough, chest tightness and shortness of breath.   Cardiovascular: Negative for chest pain, palpitations and leg swelling.  Gastrointestinal: Negative for abdominal pain, diarrhea, constipation and abdominal distention.  Genitourinary: Negative for urgency, frequency, decreased urine volume and difficulty urinating.  Musculoskeletal: Negative for back pain, arthralgias and gait problem.  Skin: Negative for color change, pallor and rash.  Neurological: Negative for dizziness, light-headedness, numbness and headaches.  Hematological: Negative for adenopathy. Does not bruise/bleed easily.  Psychiatric/Behavioral: Negative for suicidal ideas, confusion, sleep disturbance, self-injury, dysphoric mood, decreased concentration and agitation.  Pt is able to read and write and can do all ADLs No risk for falling No abuse/ violence in home          Objective:     Vitals: BP 128/80 mmHg  Pulse 75  Temp(Src) 98 F (36.7 C) (Oral)  Ht 5' 4.75" (1.645 m)  Wt 173 lb 6.4 oz (78.654 kg)  BMI 29.07 kg/m2  SpO2 98%  Body mass index is 29.07 kg/(m^2).  BP 128/80 mmHg  Pulse 75  Temp(Src) 98 F (36.7 C) (Oral)  Ht 5' 4.75" (1.645 m)  Wt 173 lb 6.4 oz (78.654 kg)  BMI 29.07 kg/m2  SpO2 98% General appearance: alert, cooperative, appears stated age and no distress Head: Normocephalic, without obvious abnormality, atraumatic Eyes: conjunctivae/corneas clear. PERRL, EOM's intact. Fundi benign. Ears: normal TM's and external ear canals both ears Nose: Nares  normal. Septum midline. Mucosa normal. No drainage or sinus tenderness. Throat: lips, mucosa, and tongue normal; teeth and gums normal Neck: no adenopathy, no carotid bruit, no JVD, supple, symmetrical, trachea midline and thyroid not enlarged, symmetric, no tenderness/mass/nodules Back: symmetric, no curvature. ROM normal. No CVA tenderness. Lungs: clear to auscultation bilaterally Breasts: normal appearance, no masses or tenderness Heart: regular rate and rhythm, S1, S2 normal, no murmur, click, rub or gallop Abdomen: soft, non-tender; bowel sounds normal; no masses,  no organomegaly Pelvic: not indicated; status post hysterectomy, negative ROS Extremities: extremities normal, atraumatic, no cyanosis or edema Pulses: 2+ and symmetric Skin: Skin color, texture, turgor normal. No rashes or lesions Lymph nodes: Cervical, supraclavicular, and axillary nodes normal. Neurologic: Alert and oriented X 3, normal strength and tone. Normal symmetric reflexes. Normal coordination and gait Psych- no depression, anxiety is improving Tobacco History  Smoking status  . Never Smoker   Smokeless tobacco  . Never Used     Counseling given: Not Answered   Past Medical History  Diagnosis Date  . Hypertension   . Osteoporosis   . COPD (chronic obstructive pulmonary disease) ([place])   . Hyperlipidemia   . Anxiety   . Depression   . Breast cancer ([place]) 13 years ago    s/p tamoxifen   . Peptic ulcer disease   . Hiatal hernia   . Ulcerative colitis   . GERD (gastroesophageal reflux disease)   . Hiatal hernia   . Osteoarthritis   . Diverticulosis   . Esophageal stricture   . Internal hemorrhoids   . Gastric outlet obstruction   . Rheumatic heart disease   . Glaucoma   .  Insomnia    Past Surgical History  Procedure Laterality Date  . Cataract extraction  08/2006 and 10/2006  . Total knee arthroplasty      left  . Hemorrhoid surgery    . Tonsillectomy    . Abdominal hysterectomy    . Joint  replacement      right  . Mastectomy  1995    right  . Breast surgery      right  . Breast reconstruction  2000    right  . Laparoscopic nissen fundoplication N/A [id number]    Procedure: LAP REPAIR OF LARGE TYPE III MIXED HIATUS HERNIA WITH NISSEN FUNOPLICATION;  Surgeon: Pedro Earls, MD;  Location: [facility];  Service: General;  Laterality: N/A;  . Hiatal hernia repair N/A 09/03/2012    Procedure: LAPAROSCOPIC REPAIR OF HIATAL HERNIA;  Surgeon: Pedro Earls, MD;  Location: [facility];  Service: General;  Laterality: N/A;   Family History  Problem Relation Age of Onset  . Coronary artery disease Sister     x 2 in 4's  . Coronary artery disease Brother     x 2 in 70's  . Diabetes    . Colon cancer Father 32  . Hypertension    . Stroke Mother 49  . Crohn's disease Brother   . Breast cancer      Aunt  . Cirrhosis Brother   . Liver disease Son    History  Sexual Activity  . Sexual Activity: Not on file    Outpatient Encounter Prescriptions as of 07/21/2015  Medication Sig  . aspirin 81 MG tablet Take 81 mg by mouth daily.    [name] azelastine (ASTELIN) 137 MCG/SPRAY nasal spray Place 1 spray into the nose 2 (two) times daily as needed (for allergies). Reported on 07/20/2015  . Biotin (BIOTIN ULTRA STRENGTH) 10 MG CAPS Take 1 capsule by mouth daily.  [name] escitalopram (LEXAPRO) 20 MG tablet Take 1 tablet (20 mg total) by mouth daily.  [name] LORazepam (ATIVAN) 2 MG tablet TAKE 1/2 TABLET BY MOUTH EVERY 8 HOURS AS NEEDED FOR ANXIETY  . pantoprazole (PROTONIX) 40 MG tablet Take 1 tablet (40 mg total) by mouth daily.  . polyethylene glycol (MIRALAX / GLYCOLAX) packet Take 17 g by mouth daily.  [name] telmisartan-hydrochlorothiazide (MICARDIS HCT) 40-12.5 MG tablet Take 1 tablet by mouth daily.  . vitamin B-12 (CYANOCOBALAMIN) 1000 MCG tablet Take 1,000 mcg by mouth daily.    . [DISCONTINUED] LORazepam (ATIVAN) 2 MG tablet TAKE 1/2 TABLET BY MOUTH EVERY 8 HOURS AS NEEDED FOR ANXIETY  . traZODone  (DESYREL) 50 MG tablet Take 0.5-1 tablets (25-50 mg total) by mouth at bedtime as needed for sleep.   No facility-administered encounter medications on file as of 07/21/2015.    Activities of Daily Living In your present state of health, do you have any difficulty performing the following activities: 07/21/2015 12/19/2014  Hearing? N Y  Vision? N Y  Difficulty concentrating or making decisions? N N  Walking or climbing stairs? N Y  Dressing or bathing? N N  Doing errands, shopping? N N  Preparing Food and eating ? - N  Using the Toilet? - N  In the past six months, have you accidently leaked urine? - N  Do you have problems with loss of bowel control? - N  Managing your Medications? - N  Managing your Finances? - N  Housekeeping or managing your Housekeeping? - N    Patient Care Team: Lovetta Held, DO  as PCP - General Gevena Cotton, MD as Consulting Physician (Ophthalmology) Lafayette Dragon, MD as Consulting Physician (Gastroenterology) Collene Gobble, MD as Consulting Physician (Pulmonary Disease)    Assessment:    cpe Exercise Activities and Dietary recommendations Current Exercise Habits: Home exercise routine, Type of exercise: walking, Time (Minutes): 30, Frequency (Times/Week): 7, Weekly Exercise (Minutes/Week): 210, Intensity: Mild, Exercise limited by: orthopedic condition(s)  Goals    . Pt states she wants to remain independent.      Stay active/physical fit.    Prevent falls.    Help others.    Brain stimulating activity to keep mind sharp.        Fall Risk Fall Risk  07/21/2015 01/20/2015 01/20/2015 12/19/2014 05/23/2014  Falls in the past year? No Yes Yes Yes Yes  Number falls in past yr: - 2 or more 1 1 2  or more  Injury with Fall? - No No No -  Risk Factor Category  - High Fall Risk - - High Fall Risk  Risk for fall due to : - Impaired balance/gait - Impaired balance/gait;Impaired vision Impaired balance/gait  Follow up - Education provided;Falls  prevention discussed - Education provided -   Depression Screen PHQ 2/9 Scores 07/21/2015 12/19/2014 05/23/2014 03/05/2013  PHQ - 2 Score 0 2 0 1  PHQ- 9 Score - 3 - -     Cognitive Testing MMSE - Mini Mental State Exam 12/19/2014  Orientation to time 5  Orientation to Place 5  Registration 3  Attention/ Calculation 5  Recall 3  Language- name 2 objects 2  Language- repeat 1  Language- follow 3 step command 3  Language- read & follow direction 1  Write a sentence 1  Copy design 1  Total score 30    Immunization History  Administered Date(s) Administered  . Influenza Whole 04/02/2007, 01/02/2009, 01/06/2010, 01/10/2012  . Influenza, High Dose Seasonal PF 01/21/2013, 12/19/2014  . Influenza,inj,Quad PF,36+ Mos 12/27/2013  . PPD Test 03/05/2013  . Pneumococcal Conjugate-13 05/23/2014  . Pneumococcal Polysaccharide-23 01/30/2008   Screening Tests Health Maintenance  Topic Date Due  . TETANUS/TDAP  01/18/1950  . ZOSTAVAX  01/17/1993  . MAMMOGRAM  07/20/2016 (Originally 10/24/2012)  . INFLUENZA VACCINE  11/17/2015  . DEXA SCAN  Completed  . PNA vac Low Risk Adult  Completed      Plan:    see  avs During the course of the visit the patient was educated and counseled about the following appropriate screening and preventive services:   Vaccines to include Pneumoccal, Influenza, Hepatitis B, Td, Zostavax, HCV  Electrocardiogram  Cardiovascular Disease  Colorectal cancer screening  Bone density screening  Diabetes screening  Glaucoma screening  Mammography/PAP  Nutrition counseling   Patient Instructions (the written plan) was given to the patient.  1. Generalized anxiety disorder stable - LORazepam (ATIVAN) 2 MG tablet; TAKE 1/2 TABLET BY MOUTH EVERY 8 HOURS AS NEEDED FOR ANXIETY  Dispense: 60 tablet; Refill: 1  2. Insomnia   traZODone (DESYREL) 50 MG tablet; Take 0.5-1 tablets (25-50 mg total) by mouth at bedtime as needed for sleep.  Dispense: 30 tablet;  Refill: 3  3. Preventative health care See above Halli Held, DO  07/25/2015

## 2015-07-23 ENCOUNTER — Telehealth: Payer: Self-pay

## 2015-07-23 DIAGNOSIS — M810 Age-related osteoporosis without current pathological fracture: Secondary | ICD-10-CM

## 2015-07-23 NOTE — Telephone Encounter (Signed)
Patient does not want fosamax states it did not work for her last time and would like an alternative medication.

## 2015-07-24 ENCOUNTER — Other Ambulatory Visit: Payer: Self-pay

## 2015-07-27 ENCOUNTER — Other Ambulatory Visit: Payer: Self-pay | Admitting: Family Medicine

## 2015-07-27 DIAGNOSIS — R222 Localized swelling, mass and lump, trunk: Secondary | ICD-10-CM

## 2015-07-30 MED ORDER — IBANDRONATE SODIUM 150 MG PO TABS: 150.0000 mg | ORAL_TABLET | ORAL | Status: DC

## 2015-07-30 NOTE — Telephone Encounter (Addendum)
Called patient to make her aware.  Unable to reach pt.  Left a message for call back.  Rx printed and given to provider for review and signature.

## 2015-07-30 NOTE — Addendum Note (Signed)
Addended by: Rudene Anda on: 07/30/2015 05:25 PM   Modules accepted: Orders

## 2015-07-30 NOTE — Telephone Encounter (Addendum)
Rx signed and faxed to pharmacy.  Future Dexa Scan ordered.  Will try to contact patient again on Monday to make her aware of change in therapy.

## 2015-07-30 NOTE — Telephone Encounter (Signed)
boniva 150 mg 1 po monthly #3  3 refills Recheck 2 years

## 2015-08-03 ENCOUNTER — Telehealth: Payer: Self-pay | Admitting: Family Medicine

## 2015-08-03 MED ORDER — ALENDRONATE SODIUM 70 MG PO TABS: 70.0000 mg | ORAL_TABLET | ORAL | Status: DC

## 2015-08-03 NOTE — Telephone Encounter (Signed)
Medication filled to pharmacy as requested. Boniva canceled at pharmacy. Pt notified and voiced understanding. No further questions or concerns at time of call.

## 2015-08-03 NOTE — Telephone Encounter (Signed)
Fosamax 70 mg 4  1 po qweek 11 refills Recheck 2 years

## 2015-08-03 NOTE — Telephone Encounter (Signed)
See phone note from 07/23/15. Pt decided to use Fosamax due to cost.

## 2015-08-03 NOTE — Telephone Encounter (Signed)
Called pt and LMOM to return call for recommendation below.

## 2015-08-03 NOTE — Telephone Encounter (Signed)
She said fosamax made her sick

## 2015-08-03 NOTE — Telephone Encounter (Signed)
Pt informed of below, states she has used Rephresh in the past and it only lasts for a day, but she has not used it consistently. She will plan to restart to Rephresh, and understands recommendation for pelvic exam if no improvement, although she declines pelvic exam at this time.   When she went to the drugstore to pick up Boniva, she found out that it will cost her about $100 out of pocket, whereas Fosamax would only cost $2. She would now like to go back to taking Fosamax, despite the side effects she had in the past. She reports it was a long time ago that she took Fosamax and she can't really remember if the side effects were a problem or not. Please advise if okay to switch to Fosamax, and dosing instructions, quantity, and refills if so. Thanks.

## 2015-08-03 NOTE — Telephone Encounter (Signed)
Pt notified of change in therapy and verbalized understanding. Pt was concerned about cost and possible side effects of Boniva. She reports that when she used Fosamax, it made her feel sick. Advised pt that nausea is an uncommon, but possible, side effect of Boniva and most other PO medications. She verbalized understanding.   Pt also reports having "vaginal odor" x1-1.5 years. States she forgot to ask Dr. Carollee Herter about it at her appt. She would like something to "control the odor." States she doesn't know if it is just "old folks odor," as her daughter cannot smell it. She has no vaginal itching, burning/pain w/ urination, no discharge, and no fever. UA 07/21/15 negative for infection. Please advise recommendations for pt's vaginal odor.

## 2015-08-03 NOTE — Telephone Encounter (Signed)
Caller name:[facility] Relationship to patient: Can be reached:[phone] Pharmacy:[facility] on [address]  Reason for call:wanting to switch boniva to fosamax due to cost. Is this ok?

## 2015-08-03 NOTE — Telephone Encounter (Signed)
Please advise      KP 

## 2015-08-03 NOTE — Telephone Encounter (Signed)
She can try rephresh otc ----  Should have pelvic exam if it does not work

## 2015-08-03 NOTE — Addendum Note (Signed)
Addended by: Dorrene German on: 08/03/2015 04:07 PM   Modules accepted: Orders, Medications

## 2015-08-04 ENCOUNTER — Ambulatory Visit (HOSPITAL_BASED_OUTPATIENT_CLINIC_OR_DEPARTMENT_OTHER)
Admission: RE | Admit: 2015-08-04 | Discharge: 2015-08-04 | Disposition: A | Discharge status: Home / Self Care | Payer: Medicare Other | Source: Ambulatory Visit | Attending: Family Medicine | Admitting: Family Medicine

## 2015-08-04 DIAGNOSIS — I700 Atherosclerosis of aorta: Secondary | ICD-10-CM | POA: Insufficient documentation

## 2015-08-04 DIAGNOSIS — R222 Localized swelling, mass and lump, trunk: Secondary | ICD-10-CM | POA: Diagnosis not present

## 2015-08-04 DIAGNOSIS — J984 Other disorders of lung: Secondary | ICD-10-CM | POA: Diagnosis not present

## 2015-08-04 DIAGNOSIS — I2510 Atherosclerotic heart disease of native coronary artery without angina pectoris: Secondary | ICD-10-CM | POA: Insufficient documentation

## 2015-08-04 DIAGNOSIS — R911 Solitary pulmonary nodule: Secondary | ICD-10-CM | POA: Diagnosis not present

## 2015-08-11 ENCOUNTER — Telehealth: Payer: Self-pay | Admitting: Family Medicine

## 2015-08-11 DIAGNOSIS — R300 Dysuria: Secondary | ICD-10-CM

## 2015-08-11 NOTE — Telephone Encounter (Signed)
07/21/15 UA was normal.     KP

## 2015-08-11 NOTE — Telephone Encounter (Signed)
Can be reached: [phone]   Reason for call: Pt said that she has a UTI and needs help of some sort. She said it has been torture since Friday. She said that she cannot drive and cannot get here for an appt. She states she has been drinking lots of water at home. She doesn't know what else to do from home.

## 2015-08-11 NOTE — Telephone Encounter (Signed)
Urine was normal on the 4th--- we would need to check urine.

## 2015-08-12 ENCOUNTER — Telehealth: Payer: Self-pay | Admitting: Family Medicine

## 2015-08-12 ENCOUNTER — Other Ambulatory Visit: Payer: Self-pay | Admitting: Emergency Medicine

## 2015-08-12 ENCOUNTER — Other Ambulatory Visit (INDEPENDENT_AMBULATORY_CARE_PROVIDER_SITE_OTHER): Payer: Medicare Other

## 2015-08-12 DIAGNOSIS — Z87440 Personal history of urinary (tract) infections: Secondary | ICD-10-CM

## 2015-08-12 DIAGNOSIS — R300 Dysuria: Secondary | ICD-10-CM

## 2015-08-12 LAB — URINALYSIS, ROUTINE W REFLEX MICROSCOPIC
BILIRUBIN URINE: NEGATIVE
Ketones, ur: NEGATIVE
Nitrite: NEGATIVE
PH: 6 (ref 5.0–8.0)
Specific Gravity, Urine: 1.01 (ref 1.000–1.030)
TOTAL PROTEIN, URINE-UPE24: 30 — AB
URINE GLUCOSE: NEGATIVE
UROBILINOGEN UA: 0.2 (ref 0.0–1.0)

## 2015-08-12 NOTE — Telephone Encounter (Signed)
Patient is requesting to change to [place] due to geographical needs.   Dr Etter Sjogren,  Is this ok with you?  Dr burns,  Is this ok with you?

## 2015-08-12 NOTE — Telephone Encounter (Signed)
Spoke with patient and made her aware that she will need to complete a Urine sample, she said she can not get out here today, she was upset and I offered going to [place] lab to leave a Urine sample, she said she was disgusted because she was in so much pain, I advised she can increase her water intake, try OTC Azo for the discomfort and get a UA so she can be treated, she said she will not be driving today, I made her aware the sooner the better because it can get worst if it is truly a UTI, she wanted med's and I made her aware that we need to know what we are treating to ensure proper medication, so prevent further issues. She verbalized understanding and has agreed to go to [place]

## 2015-08-13 ENCOUNTER — Other Ambulatory Visit: Payer: Self-pay

## 2015-08-13 ENCOUNTER — Telehealth: Payer: Self-pay

## 2015-08-13 LAB — URINE CULTURE

## 2015-08-13 MED ORDER — SULFAMETHOXAZOLE-TRIMETHOPRIM 800-160 MG PO TABS: 1.0000 | ORAL_TABLET | Freq: Two times a day (BID) | ORAL | Status: DC

## 2015-08-13 NOTE — Telephone Encounter (Signed)
Ok with me 

## 2015-08-13 NOTE — Telephone Encounter (Signed)
Call from daughter and she stated her mom is in unbearable pain due to the dysuria, she would like to get treatment prior to UC being completed. I discussed with dr.Blythe and she ok for Bactrim 1 po BID for 5 days. Rx has been faxed and Mickel Baas is aware.    KP

## 2015-08-16 NOTE — Telephone Encounter (Signed)
Ok with me 

## 2015-09-01 NOTE — Telephone Encounter (Signed)
Called patient. She states that she needs to call us back to make the appointment with dr burns in a few days.

## 2015-09-10 ENCOUNTER — Other Ambulatory Visit: Payer: Self-pay | Admitting: Family Medicine

## 2015-09-17 ENCOUNTER — Encounter: Payer: Self-pay | Admitting: Acute Care

## 2015-09-17 ENCOUNTER — Ambulatory Visit (INDEPENDENT_AMBULATORY_CARE_PROVIDER_SITE_OTHER): Payer: Medicare Other | Admitting: Acute Care

## 2015-09-17 ENCOUNTER — Ambulatory Visit (INDEPENDENT_AMBULATORY_CARE_PROVIDER_SITE_OTHER)
Admission: RE | Admit: 2015-09-17 | Discharge: 2015-09-17 | Disposition: A | Discharge status: Home / Self Care | Payer: Medicare Other | Source: Ambulatory Visit | Attending: Acute Care | Admitting: Acute Care

## 2015-09-17 ENCOUNTER — Telehealth: Payer: Self-pay | Admitting: Acute Care

## 2015-09-17 VITALS — BP 128/72 | HR 52 | Ht 64.75 in | Wt 168.0 lb

## 2015-09-17 DIAGNOSIS — R05 Cough: Secondary | ICD-10-CM

## 2015-09-17 DIAGNOSIS — R059 Cough, unspecified: Secondary | ICD-10-CM

## 2015-09-17 MED ORDER — PANTOPRAZOLE SODIUM 40 MG PO TBEC: 40.0000 mg | DELAYED_RELEASE_TABLET | Freq: Every day | ORAL | Status: DC

## 2015-09-17 NOTE — Assessment & Plan Note (Signed)
Stable interval for COPD Plan: Continue current regimen

## 2015-09-17 NOTE — Progress Notes (Signed)
History of Present Illness [name] is a 82 y.o. female  never smoker, hx ulcerative colitis, breast CA and , A Fib + RVR,  followed by Dr. Lamonte Gray.   09/17/2015 Acute Office Visit for Cough: Pt. Presents to the office today with complaint of worsening cough.She has a history of chronic cough most likely impacted by both GERD and allergic rhinitis.The cough is worse since last Wednesday and is non productive.She has not been taking her protonix and she has not been using her flonase daily as was prescribed by Dr. Lamonte Gray at her last appointment over a year ago. We discussed that this is maintenance treatment for her cough, and that she needs to take both medications on a daily basis. She verbalized understanding.  Tests  CT Chest w/o Contrast 08/04/2015:  IMPRESSION: No acute cardiopulmonary disease. No suspicious pulmonary nodule.  Coronary artery disease and aortic atherosclerosis.  Right basilar scarring.  Past medical hx Past Medical History  Diagnosis Date  . Hypertension   . Osteoporosis   . COPD (chronic obstructive pulmonary disease) ([place])   . Hyperlipidemia   . Anxiety   . Depression   . Breast cancer ([place]) 13 years ago    s/p tamoxifen   . Peptic ulcer disease   . Hiatal hernia   . Ulcerative colitis   . GERD (gastroesophageal reflux disease)   . Hiatal hernia   . Osteoarthritis   . Diverticulosis   . Esophageal stricture   . Internal hemorrhoids   . Gastric outlet obstruction   . Rheumatic heart disease   . Glaucoma   . Insomnia      Past surgical hx, Family hx, Social hx all reviewed.  Current Outpatient Prescriptions on File Prior to Visit  Medication Sig  . aspirin 81 MG tablet Take 81 mg by mouth daily.    [name] azelastine (ASTELIN) 137 MCG/SPRAY nasal spray Place 1 spray into the nose 2 (two) times daily as needed (for allergies). Reported on 07/20/2015  . Biotin (BIOTIN ULTRA STRENGTH) 10 MG CAPS Take 1 capsule by mouth daily.  [name] escitalopram  (LEXAPRO) 20 MG tablet Take 1 tablet by mouth  daily  . LORazepam (ATIVAN) 2 MG tablet TAKE 1/2 TABLET BY MOUTH EVERY 8 HOURS AS NEEDED FOR ANXIETY  . polyethylene glycol (MIRALAX / GLYCOLAX) packet Take 17 g by mouth daily.  [name] telmisartan-hydrochlorothiazide (MICARDIS HCT) 40-12.5 MG tablet Take 1 tablet by mouth  daily  . traZODone (DESYREL) 50 MG tablet Take 0.5-1 tablets (25-50 mg total) by mouth at bedtime as needed for sleep.  . vitamin B-12 (CYANOCOBALAMIN) 1000 MCG tablet Take 1,000 mcg by mouth daily.    [name] alendronate (FOSAMAX) 70 MG tablet Take 1 tablet (70 mg total) by mouth every 7 (seven) days. Take with a full glass of water on an empty stomach. (Patient not taking: Reported on 09/17/2015)   No current facility-administered medications on file prior to visit.     Allergies  Allergen Reactions  . Demerol [Meperidine] Swelling    Throat swelling   . Meperidine Hcl Swelling    REACTION: THROAT SWELLING  . Morphine     REACTION: SWELLING  . Nitroglycerin     Very low bp    Review Of Systems:  Constitutional:   No  weight loss, + night sweats,  Fevers, chills, fatigue, or  lassitude.  HEENT:   No headaches,  Difficulty swallowing,  Tooth/dental problems, or  Sore throat,  No sneezing, itching, ear ache, nasal congestion, post nasal drip,   CV:  No chest pain,  Orthopnea, PND, swelling in lower extremities, anasarca, dizziness, palpitations, syncope.   GI  No heartburn, indigestion, abdominal pain, nausea, vomiting, diarrhea, change in bowel habits, loss of appetite, bloody stools.   Resp: No shortness of breath with exertion or at rest.  No excess mucus, no productive cough,  ++non-productive cough,  No coughing up of blood.  No change in color of mucus.  No wheezing.  No chest wall deformity  Skin: no rash or lesions.  GU: no dysuria, change in color of urine, no urgency or frequency.  No flank pain, no hematuria   MS:  No joint pain or swelling.  No  decreased range of motion.  No back pain.  Psych:  No change in mood or affect. No depression or anxiety.  No memory loss.   Vital Signs BP 128/72 mmHg  Pulse 52  Ht 5' 4.75" (1.645 m)  Wt 168 lb (76.204 kg)  BMI 28.16 kg/m2  SpO2 93%   Physical Exam:  General- No distress,  A&Ox3, elderly female using a walker ENT: No sinus tenderness, TM clear, pale nasal mucosa, no oral exudate,no post nasal drip, no LAN Cardiac: S1, S2, regular rate and rhythm, no murmur Chest: No wheeze/ rales/ dullness; no accessory muscle use, no nasal flaring, no sternal retractions Abd.: Soft Non-tender Ext: No clubbing cyanosis, edema Neuro:  Baseline  strength Skin: No rashes, warm and dry Psych: normal mood and behavior   Assessment/Plan  Cough Non productive cough for the past week Non Compliance with her maintenance regimen of Protonix and Flonase. Plan: Chest x ray today. We will call you with results. We will restart your protonix 40 mg once daily. Resume your Flonase nasal steroid spray 2 sprays in each nostril daily. Delsym cough syrup every 12 hours for cough. Avoid peppermint,menthol, and chocolate Sugar free jolly ranchers for throat soothing Avoid throat clearing, use sips of water instead. Follow up in 6 weeks. Please call Dr. Ivy Gray office and let them know you are having some dizziness. Please contact office for sooner follow up if symptoms do not improve or worsen or seek emergency care     COPD Stable interval for COPD Plan: Continue current regimen     Magdalen Spatz, NP 09/17/2015  12:50 PM

## 2015-09-17 NOTE — Telephone Encounter (Signed)
I called and left a message on the patient's answering machine explaining that her CXR done today was normal and did not indicate any pneumonia. I left my contact information in the event she has any questions.

## 2015-09-17 NOTE — Assessment & Plan Note (Signed)
Non productive cough for the past week Non Compliance with her maintenance regimen of Protonix and Flonase. Plan: Chest x ray today. We will call you with results. We will restart your protonix 40 mg once daily. Resume your Flonase nasal steroid spray 2 sprays in each nostril daily. Delsym cough syrup every 12 hours for cough. Avoid peppermint,menthol, and chocolate Sugar free jolly ranchers for throat soothing Avoid throat clearing, use sips of water instead. Follow up in 6 weeks. Please call Dr. Ivy Lynn office and let them know you are having some dizziness. Please contact office for sooner follow up if symptoms do not improve or worsen or seek emergency care

## 2015-09-17 NOTE — Patient Instructions (Addendum)
It is nice to meet you today. Chest x ray today. We will call you with results. We will restart your protonix 40 mg once daily. Resume your Flonase nasal steroid spray 2 sprays in each nostril daily. Delsym cough syrup every 12 hours for cough. Avoid peppermint,menthol, and chocolate Sugar free jolly ranchers for throat soothing Avoid throat clearing, use sips of water instead. Follow up in 6 weeks. Please call Dr. Ivy Lynn office and let them know you are having some dizziness. Please contact office for sooner follow up if symptoms do not improve or worsen or seek emergency care

## 2015-10-30 ENCOUNTER — Ambulatory Visit: Payer: Medicare Other | Admitting: Adult Health

## 2015-12-01 ENCOUNTER — Encounter: Payer: Self-pay | Admitting: Internal Medicine

## 2015-12-01 ENCOUNTER — Ambulatory Visit (INDEPENDENT_AMBULATORY_CARE_PROVIDER_SITE_OTHER): Payer: Medicare Other | Admitting: Internal Medicine

## 2015-12-01 VITALS — BP 150/90 | HR 84 | Temp 98.4°F | Resp 20 | Ht 66.0 in | Wt 168.0 lb

## 2015-12-01 DIAGNOSIS — R300 Dysuria: Secondary | ICD-10-CM

## 2015-12-01 DIAGNOSIS — N390 Urinary tract infection, site not specified: Secondary | ICD-10-CM

## 2015-12-01 LAB — POCT URINALYSIS DIPSTICK
BILIRUBIN UA: NEGATIVE
GLUCOSE UA: NEGATIVE
Ketones, UA: NEGATIVE
NITRITE UA: POSITIVE
Spec Grav, UA: 1.025
Urobilinogen, UA: 1
pH, UA: 6

## 2015-12-01 MED ORDER — SULFAMETHOXAZOLE-TRIMETHOPRIM 800-160 MG PO TABS: 1.0000 | ORAL_TABLET | Freq: Two times a day (BID) | ORAL | 0 refills | Status: DC

## 2015-12-01 NOTE — Progress Notes (Signed)
Pre visit review using our clinic review tool, if applicable. No additional management support is needed unless otherwise documented below in the visit note. 

## 2015-12-01 NOTE — Patient Instructions (Signed)
We have sent in the antibiotic for the urine infection called bactrim. Take 1 pill twice a day for 5 days.    Urinary Tract Infection Urinary tract infections (UTIs) can develop anywhere along your urinary tract. Your urinary tract is your body's drainage system for removing wastes and extra water. Your urinary tract includes two kidneys, two ureters, a bladder, and a urethra. Your kidneys are a pair of bean-shaped organs. Each kidney is about the size of your fist. They are located below your ribs, one on each side of your spine. CAUSES Infections are caused by microbes, which are microscopic organisms, including fungi, viruses, and bacteria. These organisms are so small that they can only be seen through a microscope. Bacteria are the microbes that most commonly cause UTIs. SYMPTOMS  Symptoms of UTIs may vary by age and gender of the patient and by the location of the infection. Symptoms in young women typically include a frequent and intense urge to urinate and a painful, burning feeling in the bladder or urethra during urination. Older women and men are more likely to be tired, shaky, and weak and have muscle aches and abdominal pain. A fever may mean the infection is in your kidneys. Other symptoms of a kidney infection include pain in your back or sides below the ribs, nausea, and vomiting. DIAGNOSIS To diagnose a UTI, your caregiver will ask you about your symptoms. Your caregiver will also ask you to provide a urine sample. The urine sample will be tested for bacteria and white blood cells. White blood cells are made by your body to help fight infection. TREATMENT  Typically, UTIs can be treated with medication. Because most UTIs are caused by a bacterial infection, they usually can be treated with the use of antibiotics. The choice of antibiotic and length of treatment depend on your symptoms and the type of bacteria causing your infection. HOME CARE INSTRUCTIONS  If you were prescribed  antibiotics, take them exactly as your caregiver instructs you. Finish the medication even if you feel better after you have only taken some of the medication.  Drink enough water and fluids to keep your urine clear or pale yellow.  Avoid caffeine, tea, and carbonated beverages. They tend to irritate your bladder.  Empty your bladder often. Avoid holding urine for long periods of time.  Empty your bladder before and after sexual intercourse.  After a bowel movement, women should cleanse from front to back. Use each tissue only once. SEEK MEDICAL CARE IF:   You have back pain.  You develop a fever.  Your symptoms do not begin to resolve within 3 days. SEEK IMMEDIATE MEDICAL CARE IF:   You have severe back pain or lower abdominal pain.  You develop chills.  You have nausea or vomiting.  You have continued burning or discomfort with urination. MAKE SURE YOU:   Understand these instructions.  Will watch your condition.  Will get help right away if you are not doing well or get worse.   This information is not intended to replace advice given to you by your health care provider. Make sure you discuss any questions you have with your health care provider.   Document Released: 01/12/2005 Document Revised: 12/24/2014 Document Reviewed: 05/13/2011 Elsevier Interactive Patient Education [employer].

## 2015-12-01 NOTE — Assessment & Plan Note (Signed)
POC U/A done in the office consistent with infection and rx for bactrim given at visit.

## 2015-12-01 NOTE — Progress Notes (Signed)
   Subjective:    Patient ID: [name], female    DOB: Jul 06, 1930, 82 y.o.   MRN: [id number]  HPI The patient is an 82 YO female coming in for an acute visit for possible UTI. Symptoms started about 1-2 weeks ago but she was out of town visiting her great grandchild. She is having burning with urination as well as mild suprapubic pressure. She denies fevers or chills. Tried azo over the counter which has not helped.   Review of Systems  Constitutional: Negative.   Respiratory: Negative.   Cardiovascular: Negative.   Genitourinary: Positive for dysuria and frequency. Negative for decreased urine volume, flank pain, hematuria, pelvic pain and urgency.  Musculoskeletal: Negative.   Neurological: Negative.       Objective:   Physical Exam  Constitutional: She appears well-developed and well-nourished.  HENT:  Head: Normocephalic and atraumatic.  Cardiovascular: Normal rate and regular rhythm.   Pulmonary/Chest: Effort normal and breath sounds normal. No respiratory distress. She has no wheezes.  Abdominal: Soft. Bowel sounds are normal. She exhibits no distension. There is no tenderness. There is no rebound.  Skin: Skin is warm and dry.   Vitals:   12/01/15 1139  BP: (!) 150/90  Pulse: 84  Resp: 20  Temp: 98.4 F (36.9 C)  TempSrc: Oral  SpO2: 95%  Weight: 168 lb (76.2 kg)  Height: 5\' 6"  (1.676 m)      Assessment & Plan:

## 2015-12-11 ENCOUNTER — Other Ambulatory Visit: Payer: Self-pay | Admitting: Pharmacist

## 2015-12-11 NOTE — Patient Outreach (Signed)
Outreach call to [employer] regarding her request for follow up from the [facility] Medication Adherence Campaign. HIPAA identifiers verified and verbal consent received.   Patient reports that she has been taking her blood pressure medication, telmisartan-hydrochlorothiazide, once daily as directed. Denies any missed doses or any barriers to taking her medications such as cost or side effects. Reports that she uses mail order pharmacy for this medication.   Patient reports that he has no medication questions or concerns at this time.  Harlow Asa, PharmD Clinical Pharmacist [facility] [phone]

## 2015-12-13 ENCOUNTER — Other Ambulatory Visit: Payer: Self-pay | Admitting: Family Medicine

## 2015-12-13 DIAGNOSIS — F411 Generalized anxiety disorder: Secondary | ICD-10-CM

## 2015-12-14 NOTE — Telephone Encounter (Signed)
Last seen 07/21/15 and filled 07/21/15 #60 with 1    Please advise   KP

## 2016-01-05 ENCOUNTER — Other Ambulatory Visit (INDEPENDENT_AMBULATORY_CARE_PROVIDER_SITE_OTHER): Payer: Medicare Other

## 2016-01-05 ENCOUNTER — Encounter: Payer: Medicare Other | Admitting: Internal Medicine

## 2016-01-05 ENCOUNTER — Telehealth: Payer: Self-pay | Admitting: Emergency Medicine

## 2016-01-05 DIAGNOSIS — R300 Dysuria: Secondary | ICD-10-CM

## 2016-01-05 LAB — URINALYSIS, ROUTINE W REFLEX MICROSCOPIC
Ketones, ur: 15 — AB
Nitrite: POSITIVE — AB
Specific Gravity, Urine: >=1.03 — AB (ref 1.000–1.030)
URINE GLUCOSE: 250 — AB
UROBILINOGEN UA: 1 (ref 0.0–1.0)
pH: 5 (ref 5.0–8.0)

## 2016-01-05 MED ORDER — CIPROFLOXACIN HCL 250 MG PO TABS: 250.0000 mg | ORAL_TABLET | Freq: Two times a day (BID) | ORAL | 0 refills | Status: DC

## 2016-01-05 NOTE — Telephone Encounter (Signed)
An antibiotic was sent to her pof

## 2016-01-05 NOTE — Progress Notes (Signed)
error 

## 2016-01-05 NOTE — Telephone Encounter (Signed)
Pt came in early today for her appt at 1045 with Dr Quay Burow. She was confused with the appt time. Pts daughter is currently on Life Support at [facility]. Pt is under a lot of stress and thought that it would be best to reschedule appt at a later date with everythig she had going on.  Pt c/o of dysuria, frequency. Urine specimen was given and will send for a culture. Pt is in need of an antibiotic soon due to her symptoms and having to be at the hospital with her daughter. Please send to local pharmacy on file.   Appt has been rescheduled for a later date.

## 2016-01-05 NOTE — Telephone Encounter (Signed)
Notified pt. 

## 2016-01-07 LAB — URINE CULTURE

## 2016-01-19 ENCOUNTER — Ambulatory Visit (INDEPENDENT_AMBULATORY_CARE_PROVIDER_SITE_OTHER): Payer: Medicare Other | Admitting: Internal Medicine

## 2016-01-19 ENCOUNTER — Encounter: Payer: Self-pay | Admitting: Internal Medicine

## 2016-01-19 VITALS — BP 136/82 | HR 85 | Temp 98.2°F | Resp 16 | Wt 164.0 lb

## 2016-01-19 DIAGNOSIS — F411 Generalized anxiety disorder: Secondary | ICD-10-CM

## 2016-01-19 DIAGNOSIS — E041 Nontoxic single thyroid nodule: Secondary | ICD-10-CM | POA: Insufficient documentation

## 2016-01-19 DIAGNOSIS — Z23 Encounter for immunization: Secondary | ICD-10-CM

## 2016-01-19 DIAGNOSIS — I739 Peripheral vascular disease, unspecified: Secondary | ICD-10-CM

## 2016-01-19 DIAGNOSIS — F329 Major depressive disorder, single episode, unspecified: Secondary | ICD-10-CM

## 2016-01-19 DIAGNOSIS — K219 Gastro-esophageal reflux disease without esophagitis: Secondary | ICD-10-CM

## 2016-01-19 DIAGNOSIS — M810 Age-related osteoporosis without current pathological fracture: Secondary | ICD-10-CM | POA: Diagnosis not present

## 2016-01-19 DIAGNOSIS — I10 Essential (primary) hypertension: Secondary | ICD-10-CM

## 2016-01-19 DIAGNOSIS — F419 Anxiety disorder, unspecified: Principal | ICD-10-CM

## 2016-01-19 DIAGNOSIS — I779 Disorder of arteries and arterioles, unspecified: Secondary | ICD-10-CM

## 2016-01-19 DIAGNOSIS — F418 Other specified anxiety disorders: Secondary | ICD-10-CM | POA: Diagnosis not present

## 2016-01-19 DIAGNOSIS — F32A Depression, unspecified: Secondary | ICD-10-CM

## 2016-01-19 MED ORDER — LORAZEPAM 2 MG PO TABS: ORAL_TABLET | ORAL | 0 refills | Status: DC

## 2016-01-19 NOTE — Patient Instructions (Addendum)
  Test(s) ordered today. Your results will be released to [place] (or called to you) after review, usually within 72hours after test completion. If any changes need to be made, you will be notified at that same time.  All other Health Maintenance issues reviewed.   All recommended immunizations and age-appropriate screenings are up-to-date or discussed.  Flu vaccine administered today.   Medications reviewed and updated.  No changes recommended at this time.  Your prescription(s) have been submitted to your pharmacy. Please take as directed and contact our office if you believe you are having problem(s) with the medication(s).   Please followup in 4 months

## 2016-01-19 NOTE — Assessment & Plan Note (Addendum)
Needs [name] of thyroid - will order at her next visit She is still dealing with the recent death of her daughter and is having difficulty making decisions now - will discuss at her next visit

## 2016-01-19 NOTE — Assessment & Plan Note (Signed)
GERD controlled Continue daily medication  

## 2016-01-19 NOTE — Assessment & Plan Note (Signed)
Controlled despite current grief from daughter's death Continue lexapro at current dose Takes ativan at night

## 2016-01-19 NOTE — Assessment & Plan Note (Signed)
BP Readings from Last 3 Encounters:  01/19/16 136/82  12/01/15 (!) 150/90  09/17/15 128/72   BP well controlled Current regimen effective and well tolerated Continue current medications at current doses

## 2016-01-19 NOTE — Progress Notes (Signed)
Pre visit review using our clinic review tool, if applicable. No additional management support is needed unless otherwise documented below in the visit note. 

## 2016-01-19 NOTE — Assessment & Plan Note (Signed)
Stable in 2014  Should have another [name] - will discuss at her next visit

## 2016-01-19 NOTE — Assessment & Plan Note (Signed)
Not on any medication Will discuss options at her next visit May benefit from prolia - fosamax is not ideal given her GI history

## 2016-01-19 NOTE — Progress Notes (Signed)
Subjective:    Patient ID: [name], female    DOB: 1930-12-30, 82 y.o.   MRN: [id number]  HPI She is here to establish with a new pcp.  She is here for follow up.  Her daughter died recently from pneumonia and liver cirrhosis.  One of her sons also died of cirrhosis. She has one living son and grandchildren. She feels like she is doing ok with the death of her daughter, but is was unexpected and is struggling a little.   Recent UTI:  She completed the cipro I prescribed. She denies any current symptoms.    Difficulty sleeping:  She is taking lorazepam at night to help her sleep.  She denies side effects.  She has been on this for a while and it works well.  GERD, h/p PUD:  She is taking her medication daily as prescribed.  She denies any GERD symptoms and feels her GERD is well controlled.   She walks every day in the driveway.   Hypertension: She is taking her medication daily. She is compliant with a low sodium diet.  She denies chest pain, palpitations, edema, shortness of breath and regular headaches. She is exercising.    Osteoporosis: She was prescribed Fosamax, but admits she is not taking it.She does not want to take it. She was years ago and believes she had a side effect, but does not recall.  She does have difficulty swallowing at times.    Medications and allergies reviewed with patient and updated if appropriate.  Patient Active Problem List   Diagnosis Date Noted  . Multiple falls 01/20/2015  . Hx MRSA infection 03/05/2013  . Lap Repair Large Hiatal Hernia with Nissen May 2014 09/20/2012  . Cough 09/13/2012  . Large type III mixed hiatus hernia 08/16/2012  . Urinary frequency 12/09/2011  . Carotid arterial disease ([place]) 10/31/2011  . UTI 05/14/2010  . HYPERGLYCEMIA 05/14/2010  . OTH SPEC ALVEOL&PARIETOALVEOL PNEUMONOPATHIES 04/28/2010  . HEMORRHOIDS-INTERNAL 03/01/2010  . GASTRIC OUTLET OBSTRUCTION 02/10/2010  . RHEUMATIC HEART DISEASE 02/02/2010  .  ESOPHAGEAL STRICTURE 02/02/2010  . GERD 02/02/2010  . DUODENAL ULCER 02/02/2010  . HIATAL HERNIA 02/02/2010  . DIVERTICULOSIS, COLON 02/02/2010  . OSTEOARTHRITIS 02/02/2010  . DECREASED HEARING, BILATERAL 02/06/2008  . PEPTIC ULCER DISEASE 06/07/2007  . Hyperlipidemia 12/19/2006  . Anxiety and depression 12/19/2006  . COPD 12/19/2006  . GLAUCOMA 09/04/2006  . HTN (hypertension) 09/04/2006  . Osteoporosis 09/04/2006  . Personal history of other diseases of circulatory system 09/04/2006  . TOTAL HYSTERECTOMY AND BILATERAL SALPINGOOPHERECTOMY, HX OF 09/04/2006  . PREMATURE VENTRICULAR CONTRACTIONS 10/06/1994  . MASTECTOMY, RIGHT, HX OF 04/18/1993    Current Outpatient Prescriptions on File Prior to Visit  Medication Sig Dispense Refill  . aspirin 81 MG tablet Take 81 mg by mouth daily.      [name] azelastine (ASTELIN) 137 MCG/SPRAY nasal spray Place 1 spray into the nose 2 (two) times daily as needed (for allergies). Reported on 07/20/2015    . Biotin (BIOTIN ULTRA STRENGTH) 10 MG CAPS Take 1 capsule by mouth daily.    [name] escitalopram (LEXAPRO) 20 MG tablet Take 1 tablet by mouth  daily 90 tablet 1  . LORazepam (ATIVAN) 2 MG tablet TAKE 1/2 TABLET BY MOUTH EVERY 8 HOURS AS NEEDED FOR ANXIETY 60 tablet 0  . pantoprazole (PROTONIX) 40 MG tablet Take 1 tablet (40 mg total) by mouth daily. 30 tablet 11  . polyethylene glycol (MIRALAX / GLYCOLAX) packet Take 17 g  by mouth daily.    [name] telmisartan-hydrochlorothiazide (MICARDIS HCT) 40-12.5 MG tablet Take 1 tablet by mouth  daily 90 tablet 1  . vitamin B-12 (CYANOCOBALAMIN) 1000 MCG tablet Take 1,000 mcg by mouth daily.       No current facility-administered medications on file prior to visit.     Past Medical History:  Diagnosis Date  . Anxiety   . Breast cancer ([place]) 13 years ago   s/p tamoxifen   . COPD (chronic obstructive pulmonary disease) ([place])   . Depression   . Diverticulosis   . Esophageal stricture   . Gastric outlet obstruction     . GERD (gastroesophageal reflux disease)   . Glaucoma   . Hiatal hernia   . Hiatal hernia   . Hyperlipidemia   . Hypertension   . Insomnia   . Internal hemorrhoids   . Osteoarthritis   . Osteoporosis   . Peptic ulcer disease   . Rheumatic heart disease   . Ulcerative colitis     Past Surgical History:  Procedure Laterality Date  . ABDOMINAL HYSTERECTOMY    . BREAST RECONSTRUCTION  2000   right  . BREAST SURGERY     right  . CATARACT EXTRACTION  08/2006 and 10/2006  . HEMORRHOID SURGERY    . HIATAL HERNIA REPAIR N/A 09/03/2012   Procedure: LAPAROSCOPIC REPAIR OF HIATAL HERNIA;  Surgeon: Pedro Earls, MD;  Location: [facility];  Service: General;  Laterality: N/A;  . JOINT REPLACEMENT     right  . LAPAROSCOPIC NISSEN FUNDOPLICATION N/A [id number]   Procedure: LAP REPAIR OF LARGE TYPE III MIXED HIATUS HERNIA WITH NISSEN FUNOPLICATION;  Surgeon: Pedro Earls, MD;  Location: [facility];  Service: General;  Laterality: N/A;  . [place]   right  . TONSILLECTOMY    . TOTAL KNEE ARTHROPLASTY     left    Social History   Social History  . Marital status: Widowed    Spouse name: N/A  . Number of children: 3  . Years of education: N/A   Occupational History  . retired-sales Retired   Social History Main Topics  . Smoking status: Never Smoker  . Smokeless tobacco: Never Used  . Alcohol use No  . Drug use: No  . Sexual activity: Not Asked   Other Topics Concern  . None   Social History Narrative  . None    Family History  Problem Relation Age of Onset  . Coronary artery disease Sister     x 2 in 32's  . Coronary artery disease Brother     x 2 in 70's  . Diabetes    . Colon cancer Father 13  . Hypertension    . Stroke Mother 44  . Crohn's disease Brother   . Breast cancer      Aunt  . Cirrhosis Brother   . Liver disease Son     Review of Systems  Constitutional: Negative for chills and fever.  Respiratory: Negative for cough, shortness of breath  and wheezing.   Cardiovascular: Positive for palpitations (occasional - stress related). Negative for chest pain and leg swelling.  Gastrointestinal:       Jerrye Bushy controlled  Genitourinary: Negative for dysuria and hematuria.  Neurological: Negative for dizziness, light-headedness and headaches.       Objective:   Vitals:   01/19/16 1306  BP: 136/82  Pulse: 85  Resp: 16  Temp: 98.2 F (36.8 C)   Filed Weights   01/19/16  1306  Weight: 164 lb (74.4 kg)   Body mass index is 26.47 kg/m.   Physical Exam Constitutional: Appears well-developed and well-nourished. No distress.  HENT:  Head: Normocephalic and atraumatic.  Neck: Neck supple. No tracheal deviation present. No thyromegaly present.  No cervical lymphadenopathy Cardiovascular: Normal rate, regular rhythm and normal heart sounds.   2/6 sys murmur heard. no carotid bruit .  No edema Pulmonary/Chest: Effort normal and breath sounds normal. No respiratory distress. No has no wheezes. No rales.  Skin: Skin is warm and dry. Not diaphoretic.  Psychiatric: Normal mood and affect. Behavior is normal.         Assessment & Plan:   See Problem List for Assessment and Plan of chronic medical problems.

## 2016-02-02 ENCOUNTER — Telehealth: Payer: Self-pay | Admitting: Internal Medicine

## 2016-02-02 NOTE — Telephone Encounter (Signed)
Patient needs a psych evaluation.  Wants call back to tell her who she needs to go to.

## 2016-02-02 NOTE — Telephone Encounter (Signed)
Please advise 

## 2016-04-20 ENCOUNTER — Other Ambulatory Visit: Payer: Medicare Other

## 2016-04-20 ENCOUNTER — Encounter: Payer: Self-pay | Admitting: Internal Medicine

## 2016-04-20 ENCOUNTER — Ambulatory Visit (INDEPENDENT_AMBULATORY_CARE_PROVIDER_SITE_OTHER): Payer: Medicare Other | Admitting: Internal Medicine

## 2016-04-20 VITALS — BP 118/76 | HR 90 | Temp 98.7°F | Resp 20 | Wt 164.0 lb

## 2016-04-20 DIAGNOSIS — N390 Urinary tract infection, site not specified: Secondary | ICD-10-CM | POA: Diagnosis not present

## 2016-04-20 DIAGNOSIS — F4320 Adjustment disorder, unspecified: Secondary | ICD-10-CM | POA: Diagnosis not present

## 2016-04-20 DIAGNOSIS — R300 Dysuria: Secondary | ICD-10-CM | POA: Diagnosis not present

## 2016-04-20 DIAGNOSIS — F329 Major depressive disorder, single episode, unspecified: Secondary | ICD-10-CM | POA: Insufficient documentation

## 2016-04-20 DIAGNOSIS — F321 Major depressive disorder, single episode, moderate: Secondary | ICD-10-CM | POA: Diagnosis not present

## 2016-04-20 DIAGNOSIS — F4321 Adjustment disorder with depressed mood: Secondary | ICD-10-CM | POA: Insufficient documentation

## 2016-04-20 LAB — POCT URINALYSIS DIPSTICK
Bilirubin, UA: NEGATIVE
Glucose, UA: NEGATIVE
KETONES UA: NEGATIVE
SPEC GRAV UA: 1.025
UROBILINOGEN UA: NEGATIVE
pH, UA: 5.5

## 2016-04-20 MED ORDER — CITALOPRAM HYDROBROMIDE 10 MG PO TABS: 10.0000 mg | ORAL_TABLET | Freq: Every day | ORAL | 11 refills | Status: DC

## 2016-04-20 MED ORDER — CEPHALEXIN 500 MG PO CAPS: 500.0000 mg | ORAL_CAPSULE | Freq: Three times a day (TID) | ORAL | 0 refills | Status: AC

## 2016-04-20 NOTE — Patient Instructions (Signed)
Please take all new medication as prescribed - the antibiotic  Your specimen will be sent for culture, but this takes about 2 -3 days to return results  Please take all new medication as prescribed - the celexa 10 mg per day  You will be contacted regarding the referral for: counseling  Please continue all other medications as before, and refills have been done if requested.  Please have the pharmacy call with any other refills you may need.  Please keep your appointments with your specialists as you may have planned

## 2016-04-20 NOTE — Assessment & Plan Note (Signed)
At least moderate, for referral counseling,  to f/u any worsening symptoms or concerns

## 2016-04-20 NOTE — Assessment & Plan Note (Signed)
Hx and exam c/w this, Udip reviewed, for urine cx, also empiric antibx pending cx,  to f/u any worsening symptoms or concerns

## 2016-04-20 NOTE — Progress Notes (Signed)
Subjective:    Patient ID: [name], female    DOB: [birth year], 83 y.o.   MRN: [id number]  HPI  Here with 4 days onset dysuria and freq, with some urgency as well and occas leakage, but Denies urinary symptoms such as flank pain, hematuria or n/v, fever, chills.  Pt denies chest pain, increased sob or doe, wheezing, orthopnea, PND, increased LE swelling, palpitations, dizziness or syncope.  Pt denies new neurological symptoms such as new headache, or facial or extremity weakness or numbness   Pt denies polydipsia, polyuria, She unfortunately becomes emotional during visit, daughter died 3 mo ago, one son and husband and many friends already died, has one son left, also a 71yo grandson of her duaghter with whom there is apparently a significant amount of tension and stress, c/o mild to mod worsening depressive symptoms, without suicidal ideation, or panic; has ongoing anxiety as well, has benzo prn, tries not to use much.  Makes statements as to guilt over daughter death and "maybe I didn't look after her well enough over the years" and "why am I here anymore?" Past Medical History:  Diagnosis Date  . Anxiety   . Breast cancer ([place]) 13 years ago   s/p tamoxifen   . COPD (chronic obstructive pulmonary disease) ([place])   . Depression   . Diverticulosis   . Esophageal stricture   . Gastric outlet obstruction   . GERD (gastroesophageal reflux disease)   . Glaucoma   . Hiatal hernia   . Hiatal hernia   . Hyperlipidemia   . Hypertension   . Insomnia   . Internal hemorrhoids   . Osteoarthritis   . Osteoporosis   . Peptic ulcer disease   . Rheumatic heart disease   . Ulcerative colitis    Past Surgical History:  Procedure Laterality Date  . ABDOMINAL HYSTERECTOMY    . BREAST RECONSTRUCTION  2000   right  . BREAST SURGERY     right  . CATARACT EXTRACTION  08/2006 and 10/2006  . HEMORRHOID SURGERY    . HIATAL HERNIA REPAIR N/A 09/03/2012   Procedure: LAPAROSCOPIC REPAIR OF HIATAL HERNIA;   Surgeon: Pedro Earls, MD;  Location: [facility];  Service: General;  Laterality: N/A;  . JOINT REPLACEMENT     right  . LAPAROSCOPIC NISSEN FUNDOPLICATION N/A [id number]   Procedure: LAP REPAIR OF LARGE TYPE III MIXED HIATUS HERNIA WITH NISSEN FUNOPLICATION;  Surgeon: Pedro Earls, MD;  Location: [facility];  Service: General;  Laterality: N/A;  . [place]   right  . TONSILLECTOMY    . TOTAL KNEE ARTHROPLASTY     left    reports that she has never smoked. She has never used smokeless tobacco. She reports that she does not drink alcohol or use drugs. family history includes Cirrhosis in her brother; Colon cancer (age of onset: 1) in her father; Coronary artery disease in her brother and sister; Crohn's disease in her brother; Liver disease in her son; Stroke (age of onset: 5) in her mother. Allergies  Allergen Reactions  . Demerol [Meperidine] Swelling    Throat swelling   . Meperidine Hcl Swelling    REACTION: THROAT SWELLING  . Morphine     REACTION: SWELLING  . Nitroglycerin     Very low bp   Current Outpatient Prescriptions on File Prior to Visit  Medication Sig Dispense Refill  . aspirin 81 MG tablet Take 81 mg by mouth daily.      [name]  azelastine (ASTELIN) 137 MCG/SPRAY nasal spray Place 1 spray into the nose 2 (two) times daily as needed (for allergies). Reported on 07/20/2015    . Biotin (BIOTIN ULTRA STRENGTH) 10 MG CAPS Take 1 capsule by mouth daily.    [name] escitalopram (LEXAPRO) 20 MG tablet Take 1 tablet by mouth  daily 90 tablet 1  . LORazepam (ATIVAN) 2 MG tablet TAKE 1/2 TABLET BY MOUTH EVERY 8 HOURS AS NEEDED FOR ANXIETY 60 tablet 0  . pantoprazole (PROTONIX) 40 MG tablet Take 1 tablet (40 mg total) by mouth daily. 30 tablet 11  . polyethylene glycol (MIRALAX / GLYCOLAX) packet Take 17 g by mouth daily.    [name] telmisartan-hydrochlorothiazide (MICARDIS HCT) 40-12.5 MG tablet Take 1 tablet by mouth  daily 90 tablet 1  . vitamin B-12 (CYANOCOBALAMIN) 1000 MCG tablet  Take 1,000 mcg by mouth daily.       No current facility-administered medications on file prior to visit.    Review of Systems  Constitutional: Negative for unusual diaphoresis or night sweats HENT: Negative for ear swelling or discharge Eyes: Negative for worsening visual haziness  Respiratory: Negative for choking and stridor.   Gastrointestinal: Negative for distension or worsening eructation Genitourinary: Negative for retention or change in urine volume.  Musculoskeletal: Negative for other MSK pain or swelling Skin: Negative for color change and worsening wound Neurological: Negative for tremors and numbness other than noted  Psychiatric/Behavioral: Negative for decreased concentration or agitation other than above   All other system neg per pt    Objective:   Physical Exam BP 118/76   Pulse 90   Temp 98.7 F (37.1 C) (Oral)   Resp 20   Wt 164 lb (74.4 kg)   SpO2 97%   BMI 26.47 kg/m  VS noted, mild ill Constitutional: Pt appears in no apparent distress HENT: Head: NCAT.  Right Ear: External ear normal.  Left Ear: External ear normal.  Eyes: . Pupils are equal, round, and reactive to light. Conjunctivae and EOM are normal Neck: Normal range of motion. Neck supple.  Cardiovascular: Normal rate and regular rhythm.   Pulmonary/Chest: Effort normal and breath sounds without rales or wheezing.  Abd:  Soft, ND, + BS with low mid abd tender without guarding or rebound Neurological: Pt is alert. Not confused , motor grossly intact Skin: Skin is warm. No rash, no LE edema Psychiatric: Pt behavior is normal. No agitation. But tearful, depressed affect, and mild to mod nervous  No other new exam findings  POCT urinalysis dipstick  Order: [id number]  Status:  Final result  Visible to patient:  No (Not Released)  Next appt:  05/23/2016 at 11:15 AM in Internal Medicine Binnie Rail, MD)  Dx:  Dysuria   Ref Range & Units 11:37  Color, UA  brown   Clarity, UA  cloudy     Glucose, UA  negative   Bilirubin, UA  negative   Ketones, UA  negative   Spec Grav, UA  1.025   Blood, UA  3+   pH, UA  5.5   Protein, UA  3+   Urobilinogen, UA  negative   Nitrite, UA  3+   Leukocytes, UA Negative large (3+)             Assessment & Plan:

## 2016-04-20 NOTE — Assessment & Plan Note (Signed)
At least mod, denies SI or HI,  For celexa 10 qd, f/u with planned visit Dr Burns/PCP in 1 mo, or to f/u any worsening symptoms or concerns sooner, to [facility] or ER for any SI or HI

## 2016-04-20 NOTE — Progress Notes (Signed)
Pre visit review using our clinic review tool, if applicable. No additional management support is needed unless otherwise documented below in the visit note. 

## 2016-04-21 ENCOUNTER — Other Ambulatory Visit: Payer: Medicare Other

## 2016-04-22 ENCOUNTER — Encounter: Payer: Self-pay | Admitting: Internal Medicine

## 2016-04-22 LAB — URINE CULTURE

## 2016-05-22 NOTE — Progress Notes (Addendum)
Subjective:    Patient ID: [name], female    DOB: 1930/10/26, 83 y.o.   MRN: [id number]  HPI The patient is here for follow up.  Hypertension: She is taking her medication daily. She is compliant with a low sodium diet.  She denies chest pain, palpitations, edema, shortness of breath and regular headaches.   GERD, h/o PUD:  She is taking her medication daily as prescribed.  She denies any GERD symptoms and feels her GERD is well controlled.   Depression, anxiety: She takes ativan nightly and it allows her to sleep.  She is not always hungry, but does eat.  She feels depressed.  She feels anxious when her grandson is home.  Her grandson is still living with her - she has given him a lot of money.  She has told  Him he needs to move out next month.  She is still living in a big house and wonders if she should move out.    She completed her antibiotic for her UTI and has been having external vulvar itching since.  She wonders if she has a yeast infection   Medications and allergies reviewed with patient and updated if appropriate.  Patient Active Problem List   Diagnosis Date Noted  . Major depression 04/20/2016  . Grief 04/20/2016  . Thyroid nodule 01/19/2016  . Multiple falls 01/20/2015  . Hx MRSA infection 03/05/2013  . Lap Repair Large Hiatal Hernia with Nissen May 2014 09/20/2012  . Cough 09/13/2012  . Large type III mixed hiatus hernia 08/16/2012  . Carotid arterial disease ([place]) 10/31/2011  . UTI (urinary tract infection) 05/14/2010  . HYPERGLYCEMIA 05/14/2010  . OTH SPEC ALVEOL&PARIETOALVEOL PNEUMONOPATHIES 04/28/2010  . HEMORRHOIDS-INTERNAL 03/01/2010  . GASTRIC OUTLET OBSTRUCTION 02/10/2010  . RHEUMATIC HEART DISEASE 02/02/2010  . ESOPHAGEAL STRICTURE 02/02/2010  . GERD 02/02/2010  . DUODENAL ULCER 02/02/2010  . HIATAL HERNIA 02/02/2010  . DIVERTICULOSIS, COLON 02/02/2010  . OSTEOARTHRITIS 02/02/2010  . DECREASED HEARING, BILATERAL 02/06/2008  . PEPTIC ULCER  DISEASE 06/07/2007  . Hyperlipidemia 12/19/2006  . Anxiety and depression 12/19/2006  . COPD 12/19/2006  . GLAUCOMA 09/04/2006  . HTN (hypertension) 09/04/2006  . Osteoporosis 09/04/2006  . PREMATURE VENTRICULAR CONTRACTIONS 10/06/1994  . MASTECTOMY, RIGHT, HX OF 04/18/1993    Current Outpatient Prescriptions on File Prior to Visit  Medication Sig Dispense Refill  . aspirin 81 MG tablet Take 81 mg by mouth daily.      [name] azelastine (ASTELIN) 137 MCG/SPRAY nasal spray Place 1 spray into the nose 2 (two) times daily as needed (for allergies). Reported on 07/20/2015    . Biotin (BIOTIN ULTRA STRENGTH) 10 MG CAPS Take 1 capsule by mouth daily.    . citalopram (CELEXA) 10 MG tablet Take 1 tablet (10 mg total) by mouth daily. 30 tablet 11  . escitalopram (LEXAPRO) 20 MG tablet Take 1 tablet by mouth  daily 90 tablet 1  . LORazepam (ATIVAN) 2 MG tablet TAKE 1/2 TABLET BY MOUTH EVERY 8 HOURS AS NEEDED FOR ANXIETY 60 tablet 0  . pantoprazole (PROTONIX) 40 MG tablet Take 1 tablet (40 mg total) by mouth daily. 30 tablet 11  . polyethylene glycol (MIRALAX / GLYCOLAX) packet Take 17 g by mouth daily.    [name] telmisartan-hydrochlorothiazide (MICARDIS HCT) 40-12.5 MG tablet Take 1 tablet by mouth  daily 90 tablet 1  . vitamin B-12 (CYANOCOBALAMIN) 1000 MCG tablet Take 1,000 mcg by mouth daily.       No current  facility-administered medications on file prior to visit.     Past Medical History:  Diagnosis Date  . Anxiety   . Breast cancer ([place]) 13 years ago   s/p tamoxifen   . COPD (chronic obstructive pulmonary disease) ([place])   . Depression   . Diverticulosis   . Esophageal stricture   . Gastric outlet obstruction   . GERD (gastroesophageal reflux disease)   . Glaucoma   . Hiatal hernia   . Hiatal hernia   . Hyperlipidemia   . Hypertension   . Insomnia   . Internal hemorrhoids   . Osteoarthritis   . Osteoporosis   . Peptic ulcer disease   . Rheumatic heart disease   . Ulcerative colitis       Past Surgical History:  Procedure Laterality Date  . ABDOMINAL HYSTERECTOMY    . BREAST RECONSTRUCTION  2000   right  . BREAST SURGERY     right  . CATARACT EXTRACTION  08/2006 and 10/2006  . HEMORRHOID SURGERY    . HIATAL HERNIA REPAIR N/A 09/03/2012   Procedure: LAPAROSCOPIC REPAIR OF HIATAL HERNIA;  Surgeon: Pedro Earls, MD;  Location: [facility];  Service: General;  Laterality: N/A;  . JOINT REPLACEMENT     right  . LAPAROSCOPIC NISSEN FUNDOPLICATION N/A [id number]   Procedure: LAP REPAIR OF LARGE TYPE III MIXED HIATUS HERNIA WITH NISSEN FUNOPLICATION;  Surgeon: Pedro Earls, MD;  Location: [facility];  Service: General;  Laterality: N/A;  . [place]   right  . TONSILLECTOMY    . TOTAL KNEE ARTHROPLASTY     left    Social History   Social History  . Marital status: Widowed    Spouse name: N/A  . Number of children: 3  . Years of education: N/A   Occupational History  . retired-sales Retired   Social History Main Topics  . Smoking status: Never Smoker  . Smokeless tobacco: Never Used  . Alcohol use No  . Drug use: No  . Sexual activity: Not Asked   Other Topics Concern  . None   Social History Narrative  . None    Family History  Problem Relation Age of Onset  . Coronary artery disease Sister     x 2 in 1's  . Coronary artery disease Brother     x 2 in 70's  . Diabetes    . Colon cancer Father 62  . Hypertension    . Stroke Mother 20  . Crohn's disease Brother   . Breast cancer      Aunt  . Cirrhosis Brother   . Liver disease Son     Review of Systems  Constitutional: Positive for appetite change (decreased). Negative for fever.  Respiratory: Positive for cough (chronic). Negative for shortness of breath and wheezing.   Cardiovascular: Negative for chest pain, palpitations and leg swelling.  Neurological: Positive for dizziness (occasional). Negative for light-headedness and headaches.       Objective:   Vitals:   05/23/16 1128   BP: 104/70  Pulse: 90  Resp: 16  Temp: 98.1 F (36.7 C)   Wt Readings from Last 3 Encounters:  05/23/16 164 lb (74.4 kg)  04/20/16 164 lb (74.4 kg)  01/19/16 164 lb (74.4 kg)   Body mass index is 26.47 kg/m.   Physical Exam    Constitutional: Appears well-developed and well-nourished. No distress.  HENT:  Head: Normocephalic and atraumatic.  Cardiovascular: Normal rate, regular rhythm and normal heart sounds.  No murmur heard. No carotid bruit .  No edema Pulmonary/Chest: Effort normal and breath sounds normal. No respiratory distress. No has no wheezes. No rales.  Skin: Skin is warm and dry. Not diaphoretic.  Psychiatric: depressed mood and affect, crying intermittently. Behavior is normal.      Assessment & Plan:    See Problem List for Assessment and Plan of chronic medical problems.

## 2016-05-23 ENCOUNTER — Encounter: Payer: Self-pay | Admitting: Internal Medicine

## 2016-05-23 ENCOUNTER — Ambulatory Visit (INDEPENDENT_AMBULATORY_CARE_PROVIDER_SITE_OTHER): Payer: Medicare Other | Admitting: Internal Medicine

## 2016-05-23 VITALS — BP 104/70 | HR 90 | Temp 98.1°F | Resp 16 | Wt 164.0 lb

## 2016-05-23 DIAGNOSIS — K219 Gastro-esophageal reflux disease without esophagitis: Secondary | ICD-10-CM | POA: Diagnosis not present

## 2016-05-23 DIAGNOSIS — B3731 Acute candidiasis of vulva and vagina: Secondary | ICD-10-CM

## 2016-05-23 DIAGNOSIS — F418 Other specified anxiety disorders: Secondary | ICD-10-CM

## 2016-05-23 DIAGNOSIS — I10 Essential (primary) hypertension: Secondary | ICD-10-CM

## 2016-05-23 DIAGNOSIS — B373 Candidiasis of vulva and vagina: Secondary | ICD-10-CM

## 2016-05-23 DIAGNOSIS — F32A Depression, unspecified: Secondary | ICD-10-CM

## 2016-05-23 DIAGNOSIS — F329 Major depressive disorder, single episode, unspecified: Secondary | ICD-10-CM

## 2016-05-23 DIAGNOSIS — F419 Anxiety disorder, unspecified: Secondary | ICD-10-CM

## 2016-05-23 MED ORDER — SERTRALINE HCL 50 MG PO TABS: 50.0000 mg | ORAL_TABLET | Freq: Every day | ORAL | 5 refills | Status: DC

## 2016-05-23 MED ORDER — FLUCONAZOLE 150 MG PO TABS: 150.0000 mg | ORAL_TABLET | Freq: Once | ORAL | 0 refills | Status: AC

## 2016-05-23 NOTE — Assessment & Plan Note (Signed)
Not controlled - no improvement with addition of celexa Stop lexapro, stop celexa Start sertraline Follow up in 3-4 weeks Deferred seeing a therapist at this time

## 2016-05-23 NOTE — Assessment & Plan Note (Signed)
From recent abx diflucan 150 mg x 1

## 2016-05-23 NOTE — Assessment & Plan Note (Signed)
GERD controlled Continue daily medication  

## 2016-05-23 NOTE — Assessment & Plan Note (Signed)
BP well controlled Current regimen effective and well tolerated Continue current medications at current doses  

## 2016-05-23 NOTE — Addendum Note (Signed)
Addended by: Binnie Rail on: 05/23/2016 01:03 PM   Modules accepted: Orders

## 2016-05-23 NOTE — Patient Instructions (Addendum)
  Medications reviewed and updated.  Changes include stopping the citalopram and lexparo.  Start taking sertraline at bedtime.   Your prescription(s) have been submitted to your pharmacy. Please take as directed and contact our office if you believe you are having problem(s) with the medication(s).   Please followup in 3-4 weeks

## 2016-05-23 NOTE — Progress Notes (Signed)
Pre visit review using our clinic review tool, if applicable. No additional management support is needed unless otherwise documented below in the visit note. 

## 2016-05-24 ENCOUNTER — Other Ambulatory Visit: Payer: Self-pay | Admitting: Family Medicine

## 2016-06-11 ENCOUNTER — Encounter: Payer: Self-pay | Admitting: Internal Medicine

## 2016-06-11 ENCOUNTER — Ambulatory Visit (INDEPENDENT_AMBULATORY_CARE_PROVIDER_SITE_OTHER): Payer: Medicare Other | Admitting: Internal Medicine

## 2016-06-11 DIAGNOSIS — B9789 Other viral agents as the cause of diseases classified elsewhere: Secondary | ICD-10-CM | POA: Diagnosis not present

## 2016-06-11 DIAGNOSIS — J069 Acute upper respiratory infection, unspecified: Secondary | ICD-10-CM

## 2016-06-11 MED ORDER — AMOXICILLIN-POT CLAVULANATE 875-125 MG PO TABS: 1.0000 | ORAL_TABLET | Freq: Two times a day (BID) | ORAL | 0 refills | Status: DC

## 2016-06-11 MED ORDER — PREDNISONE 10 MG PO TABS: ORAL_TABLET | ORAL | 0 refills | Status: DC

## 2016-06-11 NOTE — Progress Notes (Signed)
Subjective:  Patient ID: [name], female    DOB: [birth year]  Age: 83 y.o. MRN: [id number]  CC: The encounter diagnosis was Viral URI with cough.  HPI [name] presents for cold symptoms .  Started 3 day ago with harsh dry cough, scant sputum,  And Sore throat.  Sore throat is  improving,  Chest hurts from cough,  No wheezing.  Has been told she has copd but has never smoked and has no history of asthma. Some sinus congestion without pain, fevers  or purulent drainage.     Outpatient Medications Prior to Visit  Medication Sig Dispense Refill  . aspirin 81 MG tablet Take 81 mg by mouth daily.      [name] azelastine (ASTELIN) 137 MCG/SPRAY nasal spray Place 1 spray into the nose 2 (two) times daily as needed (for allergies). Reported on 07/20/2015    . Biotin (BIOTIN ULTRA STRENGTH) 10 MG CAPS Take 1 capsule by mouth daily.    [name] LORazepam (ATIVAN) 2 MG tablet TAKE 1/2 TABLET BY MOUTH EVERY 8 HOURS AS NEEDED FOR ANXIETY 60 tablet 0  . pantoprazole (PROTONIX) 40 MG tablet Take 1 tablet (40 mg total) by mouth daily. 30 tablet 11  . polyethylene glycol (MIRALAX / GLYCOLAX) packet Take 17 g by mouth daily.    . sertraline (ZOLOFT) 50 MG tablet Take 1 tablet (50 mg total) by mouth at bedtime. 30 tablet 5  . telmisartan-hydrochlorothiazide (MICARDIS HCT) 40-12.5 MG tablet Take 1 tablet by mouth  daily 90 tablet 1  . vitamin B-12 (CYANOCOBALAMIN) 1000 MCG tablet Take 1,000 mcg by mouth daily.       No facility-administered medications prior to visit.     Review of Systems;  Patient denies headache, fevers, malaise, unintentional weight loss, skin rash, eye pain, sinus congestion and sinus pain, sore throat, dysphagia,  hemoptysis , cough, dyspnea, wheezing, chest pain, palpitations, orthopnea, edema, abdominal pain, nausea, melena, diarrhea, constipation, flank pain, dysuria, hematuria, urinary  Frequency, nocturia, numbness, tingling, seizures,  Focal weakness, Loss of consciousness,  Tremor,  insomnia, depression, anxiety, and suicidal ideation.      Objective:  BP 114/70   Pulse (!) 57   Temp 98.4 F (36.9 C) (Oral)   Wt 163 lb 8 oz (74.2 kg)   SpO2 97%   BMI 26.39 kg/m   BP Readings from Last 3 Encounters:  06/11/16 114/70  05/23/16 104/70  04/20/16 118/76    Wt Readings from Last 3 Encounters:  06/11/16 163 lb 8 oz (74.2 kg)  05/23/16 164 lb (74.4 kg)  04/20/16 164 lb (74.4 kg)    General appearance: alert, cooperative and appears stated age Ears: normal TM's and external ear canals both ears Throat: lips, mucosa, and tongue normal; teeth and gums normal Neck: no adenopathy, no carotid bruit, supple, symmetrical, trachea midline and thyroid not enlarged, symmetric, no tenderness/mass/nodules Back: symmetric, no curvature. ROM normal. No CVA tenderness. Lungs: clear to auscultation bilaterally Heart: regular rate and rhythm, S1, S2 normal, no murmur, click, rub or gallop Abdomen: soft, non-tender; bowel sounds normal; no masses,  no organomegaly Pulses: 2+ and symmetric Skin: Skin color, texture, turgor normal. No rashes or lesions Lymph nodes: Cervical, supraclavicular, and axillary nodes normal.  Lab Results  Component Value Date   HGBA1C 6.3 05/23/2014   HGBA1C 6.1 02/04/2011   HGBA1C 6.4 08/02/2010    Lab Results  Component Value Date   CREATININE 0.70 07/21/2015   CREATININE 0.70 01/20/2015   CREATININE  0.74 05/23/2014    Lab Results  Component Value Date   WBC 6.3 07/21/2015   HGB 14.3 07/21/2015   HCT 43.0 07/21/2015   PLT 245.0 07/21/2015   GLUCOSE 117 (H) 07/21/2015   CHOL 211 (H) 07/21/2015   TRIG 147.0 07/21/2015   HDL 54.00 07/21/2015   LDLCALC 128 (H) 07/21/2015   ALT 12 07/21/2015   AST 17 07/21/2015   NA 138 07/21/2015   K 3.7 07/21/2015   CL 100 07/21/2015   CREATININE 0.70 07/21/2015   BUN 19 07/21/2015   CO2 31 07/21/2015   TSH 1.084 09/30/2011   INR 1.02 04/14/2010   HGBA1C 6.3 05/23/2014    Dg Chest 2  View  Result Date: 09/17/2015 CLINICAL DATA:  Ten days of nonproductive cough and dyspnea; history of COPD, pneumonia, and breast malignancy. EXAM: CHEST  2 VIEW COMPARISON:  CT scan of the chest of August 04, 2015 and chest x-ray of July 21, 2015 FINDINGS: The lungs are mildly hyperinflated. There is no focal infiltrate. There is no pleural effusion. The heart and pulmonary vascularity are normal. The trachea is midline. There is tortuosity of the descending thoracic aorta. There surgical clips in the right axillary region. There is prominent thoracic kyphosis with previously treated compression fracture T12. IMPRESSION: There is no pneumonia, CHF, nor other acute cardiopulmonary abnormality. Stable changes of COPD Electronically Signed   By: David  Martinique M.D.   On: 09/17/2015 14:55    Assessment & Plan:   Problem List Items Addressed This Visit    Viral URI with cough    Her symptoms do not suggest that she has a bacterial infection .  Will treat with a prednisone taper and a cough suppressant   Advised to add antibiotic and probiotic for worsening of symptoms or failure to resolve after 5 days         I have discontinued Ms. Bynum's predniSONE. I am also having her start on predniSONE and amoxicillin-clavulanate. Additionally, I am having her maintain her aspirin, vitamin B-12, azelastine, polyethylene glycol, Biotin, telmisartan-hydrochlorothiazide, pantoprazole, LORazepam, and sertraline.  Meds ordered this encounter  Medications  . DISCONTD: predniSONE (DELTASONE) 10 MG tablet    Sig: 6 tablets on Day 1 , then reduce by 1 tablet daily until gone    Dispense:  21 tablet    Refill:  0  . predniSONE (DELTASONE) 10 MG tablet    Sig: 6 tablets on Day 1 , then reduce by 1 tablet daily until gone    Dispense:  21 tablet    Refill:  0  . amoxicillin-clavulanate (AUGMENTIN) 875-125 MG tablet    Sig: Take 1 tablet by mouth 2 (two) times daily.    Dispense:  14 tablet    Refill:  0     Medications Discontinued During This Encounter  Medication Reason  . predniSONE (DELTASONE) 10 MG tablet     Follow-up: No Follow-up on file.   Crecencio Mc, MD

## 2016-06-11 NOTE — Patient Instructions (Signed)
You have a viral syndrome which is causing bronchitis.    The post nasal drip may also be contributing to  your  Cough.  I am prescribing a prednisone  taper  to manage the inflammation in your bronchial tubes   I also advise use of the following OTC meds to help with your other symptoms.   Continue your Alka Seltzer PLUS  You can add Delsym for  Cough.  flush your sinuses twice daily with Milta Deiters Meds sinus rinse   Your coughing may last 7 to 10 days and yo [place] develop a low grade fever ( T > 100.4,  les than 102) but if you develop Green nasal discharge,  Ear  Or facial pain, start the antibiotic I have given you a prescription for   Please take a probiotic ( Align, Floraque or Culturelle) while you are on the antibiotic to prevent a serious antibiotic associated diarrhea  Called clostridium dificile colitis and a vaginal yeast infection

## 2016-06-11 NOTE — Assessment & Plan Note (Signed)
Her symptoms do not suggest that she has a bacterial infection .  Will treat with a prednisone taper and a cough suppressant   Advised to add antibiotic and probiotic for worsening of symptoms or failure to resolve after 5 days

## 2016-06-19 NOTE — Progress Notes (Signed)
Subjective:    Patient ID: [name], female    DOB: December 20, 1930, 83 y.o.   MRN: [id number]  HPI The patient is here for follow up.  She was seen 2/24 for a viral URI.   Anxiety, depression:  Four weeks ago we stopped her lexapro and celexa and start sertraline.  Her depression and anxiety are related to her daughters death last year and her grandson living with her.  She also has concerns regarding her 26 year old granddaughter.  She fels the sertraline has helped.  She has taken the ativan a couple of times this past month during the day - she was anxious/shaking and it helped.  She usually take the ativan at night to help her sleep.  Medications and allergies reviewed with patient and updated if appropriate.  Patient Active Problem List   Diagnosis Date Noted  . Viral URI with cough 06/11/2016  . Vaginal yeast infection 05/23/2016  . Major depression 04/20/2016  . Grief 04/20/2016  . Thyroid nodule 01/19/2016  . Multiple falls 01/20/2015  . Hx MRSA infection 03/05/2013  . Lap Repair Large Hiatal Hernia with Nissen May 2014 09/20/2012  . Cough 09/13/2012  . Large type III mixed hiatus hernia 08/16/2012  . Carotid arterial disease ([place]) 10/31/2011  . HYPERGLYCEMIA 05/14/2010  . OTH SPEC ALVEOL&PARIETOALVEOL PNEUMONOPATHIES 04/28/2010  . HEMORRHOIDS-INTERNAL 03/01/2010  . GASTRIC OUTLET OBSTRUCTION 02/10/2010  . RHEUMATIC HEART DISEASE 02/02/2010  . ESOPHAGEAL STRICTURE 02/02/2010  . GERD 02/02/2010  . DUODENAL ULCER 02/02/2010  . HIATAL HERNIA 02/02/2010  . DIVERTICULOSIS, COLON 02/02/2010  . OSTEOARTHRITIS 02/02/2010  . DECREASED HEARING, BILATERAL 02/06/2008  . PEPTIC ULCER DISEASE 06/07/2007  . Hyperlipidemia 12/19/2006  . Anxiety and depression 12/19/2006  . COPD 12/19/2006  . GLAUCOMA 09/04/2006  . HTN (hypertension) 09/04/2006  . Osteoporosis 09/04/2006  . PREMATURE VENTRICULAR CONTRACTIONS 10/06/1994  . MASTECTOMY, RIGHT, HX OF 04/18/1993    Current  Outpatient Prescriptions on File Prior to Visit  Medication Sig Dispense Refill  . aspirin 81 MG tablet Take 81 mg by mouth daily.      [name] azelastine (ASTELIN) 137 MCG/SPRAY nasal spray Place 1 spray into the nose 2 (two) times daily as needed (for allergies). Reported on 07/20/2015    . Biotin (BIOTIN ULTRA STRENGTH) 10 MG CAPS Take 1 capsule by mouth daily.    . pantoprazole (PROTONIX) 40 MG tablet Take 1 tablet (40 mg total) by mouth daily. 30 tablet 11  . polyethylene glycol (MIRALAX / GLYCOLAX) packet Take 17 g by mouth daily.    . sertraline (ZOLOFT) 50 MG tablet Take 1 tablet (50 mg total) by mouth at bedtime. 30 tablet 5  . telmisartan-hydrochlorothiazide (MICARDIS HCT) 40-12.5 MG tablet Take 1 tablet by mouth  daily 90 tablet 1  . vitamin B-12 (CYANOCOBALAMIN) 1000 MCG tablet Take 1,000 mcg by mouth daily.       No current facility-administered medications on file prior to visit.     Past Medical History:  Diagnosis Date  . Anxiety   . Breast cancer ([place]) 13 years ago   s/p tamoxifen   . COPD (chronic obstructive pulmonary disease) ([place])   . Depression   . Diverticulosis   . Esophageal stricture   . Gastric outlet obstruction   . GERD (gastroesophageal reflux disease)   . Glaucoma   . Hiatal hernia   . Hiatal hernia   . Hyperlipidemia   . Hypertension   . Insomnia   . Internal hemorrhoids   .  Osteoarthritis   . Osteoporosis   . Peptic ulcer disease   . Rheumatic heart disease   . Ulcerative colitis     Past Surgical History:  Procedure Laterality Date  . ABDOMINAL HYSTERECTOMY    . BREAST RECONSTRUCTION  2000   right  . BREAST SURGERY     right  . CATARACT EXTRACTION  08/2006 and 10/2006  . HEMORRHOID SURGERY    . HIATAL HERNIA REPAIR N/A 09/03/2012   Procedure: LAPAROSCOPIC REPAIR OF HIATAL HERNIA;  Surgeon: Pedro Earls, MD;  Location: [facility];  Service: General;  Laterality: N/A;  . JOINT REPLACEMENT     right  . LAPAROSCOPIC NISSEN FUNDOPLICATION N/A  [id number]   Procedure: LAP REPAIR OF LARGE TYPE III MIXED HIATUS HERNIA WITH NISSEN FUNOPLICATION;  Surgeon: Pedro Earls, MD;  Location: [facility];  Service: General;  Laterality: N/A;  . [place]   right  . TONSILLECTOMY    . TOTAL KNEE ARTHROPLASTY     left    Social History   Social History  . Marital status: Widowed    Spouse name: N/A  . Number of children: 3  . Years of education: N/A   Occupational History  . retired-sales Retired   Social History Main Topics  . Smoking status: Never Smoker  . Smokeless tobacco: Never Used  . Alcohol use No  . Drug use: No  . Sexual activity: Not Asked   Other Topics Concern  . None   Social History Narrative  . None    Family History  Problem Relation Age of Onset  . Coronary artery disease Sister     x 2 in 71's  . Coronary artery disease Brother     x 2 in 70's  . Diabetes    . Colon cancer Father 65  . Hypertension    . Stroke Mother 59  . Crohn's disease Brother   . Breast cancer      Aunt  . Cirrhosis Brother   . Liver disease Son     Review of Systems  Constitutional: Negative for fever.       Appetite normal  Respiratory: Positive for cough (only occasional). Negative for shortness of breath and wheezing.   Cardiovascular: Negative for chest pain and palpitations.  Psychiatric/Behavioral: Positive for dysphoric mood and sleep disturbance (controlled with ativan). The patient is nervous/anxious.        Objective:   Vitals:   06/20/16 1027  BP: 132/80  Pulse: 83  Resp: 16  Temp: 98.1 F (36.7 C)   Wt Readings from Last 3 Encounters:  06/20/16 168 lb (76.2 kg)  06/11/16 163 lb 8 oz (74.2 kg)  05/23/16 164 lb (74.4 kg)   Body mass index is 27.12 kg/m.   Physical Exam  Constitutional: She appears well-developed and well-nourished. No distress.  Skin: Skin is warm and dry. She is not diaphoretic.  Psychiatric: Her behavior is normal. Judgment and thought content normal.  Depressed  affect        Assessment & Plan:    See Problem List for Assessment and Plan of chronic medical problems.

## 2016-06-20 ENCOUNTER — Ambulatory Visit (INDEPENDENT_AMBULATORY_CARE_PROVIDER_SITE_OTHER): Payer: Medicare Other | Admitting: Internal Medicine

## 2016-06-20 ENCOUNTER — Encounter: Payer: Self-pay | Admitting: Internal Medicine

## 2016-06-20 VITALS — BP 132/80 | HR 83 | Temp 98.1°F | Resp 16 | Wt 168.0 lb

## 2016-06-20 DIAGNOSIS — F329 Major depressive disorder, single episode, unspecified: Secondary | ICD-10-CM

## 2016-06-20 DIAGNOSIS — F418 Other specified anxiety disorders: Secondary | ICD-10-CM

## 2016-06-20 DIAGNOSIS — F419 Anxiety disorder, unspecified: Principal | ICD-10-CM

## 2016-06-20 DIAGNOSIS — F411 Generalized anxiety disorder: Secondary | ICD-10-CM

## 2016-06-20 MED ORDER — LORAZEPAM 2 MG PO TABS: ORAL_TABLET | ORAL | 0 refills | Status: DC

## 2016-06-20 NOTE — Patient Instructions (Signed)
  Medications reviewed and updated.  No changes recommended at this time.  Your prescription(s) have been submitted to your pharmacy. Please take as directed and contact our office if you believe you are having problem(s) with the medication(s).    Please followup in 3 months   

## 2016-06-20 NOTE — Progress Notes (Signed)
Pre visit review using our clinic review tool, if applicable. No additional management support is needed unless otherwise documented below in the visit note. 

## 2016-06-20 NOTE — Assessment & Plan Note (Signed)
Improved Continue sertraline 50 mg daily Taking ativan  as needed - usually takes 1/2 at bedtime - helps her sleep and stay calm Follow up in 3 months

## 2016-08-02 DIAGNOSIS — H5460 Unqualified visual loss, one eye, unspecified: Secondary | ICD-10-CM | POA: Diagnosis not present

## 2016-08-02 DIAGNOSIS — H47019 Ischemic optic neuropathy, unspecified eye: Secondary | ICD-10-CM | POA: Diagnosis not present

## 2016-08-02 DIAGNOSIS — Z961 Presence of intraocular lens: Secondary | ICD-10-CM | POA: Diagnosis not present

## 2016-08-02 DIAGNOSIS — H538 Other visual disturbances: Secondary | ICD-10-CM | POA: Diagnosis not present

## 2016-08-17 ENCOUNTER — Other Ambulatory Visit: Payer: Self-pay | Admitting: Acute Care

## 2016-09-14 ENCOUNTER — Other Ambulatory Visit: Payer: Self-pay | Admitting: Acute Care

## 2016-09-16 ENCOUNTER — Other Ambulatory Visit: Payer: Self-pay | Admitting: Acute Care

## 2016-09-18 DIAGNOSIS — R7303 Prediabetes: Secondary | ICD-10-CM | POA: Insufficient documentation

## 2016-09-18 NOTE — Patient Instructions (Addendum)
  Test(s) ordered today. Your results will be released to MyChart (or called to you) after review, usually within 72hours after test completion. If any changes need to be made, you will be notified at that same time.  Medications reviewed and updated.  No changes recommended at this time.    Please followup in 6 months   

## 2016-09-18 NOTE — Progress Notes (Signed)
Subjective:    Patient ID: [name], female    DOB: 1931-04-15, 83 y.o.   MRN: [id number]  HPI The patient is here for follow up.   Osteoporosis:  She has been on fosamax in the past for years.  She does have GERD and h/o PUD and gastric outlet obstruction. She is not interested in taking any medication for her osteoporosis.  She does not exercise.    Depression: She is taking her medication daily as prescribed. She denies any side effects from the medication. She thinks it helps her sleep.  She feels her depression is not well controlled, but does not feel increasing the medication will help.  She needs to get her grandson out of her house.    Anxiety: She is taking her sertraline daily as prescribed. She takes ativan as needed.  She denies any side effects from the medication. She feels her anxiety is somewhat controlled.  She is not interested in making any changes in her medication.   GERD:  She is taking her medication daily as prescribed.  She denies any GERD symptoms and feels her GERD is well controlled.   Hypertension: She is taking her medication daily. She is compliant with a low sodium diet.  She denies chest pain, palpitations, edema, shortness of breath and regular headaches. She is not exercising regularly.     Prediabetes:  She is compliant with a low sugar/carbohydrate diet.  She is not exercising regularly.   Medications and allergies reviewed with patient and updated if appropriate.  Patient Active Problem List   Diagnosis Date Noted  . Prediabetes 09/18/2016  . Grief 04/20/2016  . Thyroid nodule 01/19/2016  . Multiple falls 01/20/2015  . Hx MRSA infection 03/05/2013  . Lap Repair Large Hiatal Hernia with Nissen May 2014 09/20/2012  . Large type III mixed hiatus hernia 08/16/2012  . Carotid arterial disease ([place]) 10/31/2011  . OTH SPEC ALVEOL&PARIETOALVEOL PNEUMONOPATHIES 04/28/2010  . HEMORRHOIDS-INTERNAL 03/01/2010  . GASTRIC OUTLET OBSTRUCTION  02/10/2010  . RHEUMATIC HEART DISEASE 02/02/2010  . ESOPHAGEAL STRICTURE 02/02/2010  . GERD 02/02/2010  . DUODENAL ULCER 02/02/2010  . HIATAL HERNIA 02/02/2010  . DIVERTICULOSIS, COLON 02/02/2010  . OSTEOARTHRITIS 02/02/2010  . DECREASED HEARING, BILATERAL 02/06/2008  . PEPTIC ULCER DISEASE 06/07/2007  . Hyperlipidemia 12/19/2006  . Anxiety and depression 12/19/2006  . COPD 12/19/2006  . GLAUCOMA 09/04/2006  . HTN (hypertension) 09/04/2006  . Osteoporosis 09/04/2006  . PREMATURE VENTRICULAR CONTRACTIONS 10/06/1994  . MASTECTOMY, RIGHT, HX OF 04/18/1993    Current Outpatient Prescriptions on File Prior to Visit  Medication Sig Dispense Refill  . aspirin 81 MG tablet Take 81 mg by mouth daily.      [name] azelastine (ASTELIN) 137 MCG/SPRAY nasal spray Place 1 spray into the nose 2 (two) times daily as needed (for allergies). Reported on 07/20/2015    . Biotin (BIOTIN ULTRA STRENGTH) 10 MG CAPS Take 1 capsule by mouth daily.    [name] LORazepam (ATIVAN) 2 MG tablet TAKE 1/2 TABLET BY MOUTH EVERY 8 HOURS AS NEEDED FOR ANXIETY 60 tablet 0  . pantoprazole (PROTONIX) 40 MG tablet TAKE 1 TABLET BY MOUTH EVERY DAY. 90 tablet 0  . polyethylene glycol (MIRALAX / GLYCOLAX) packet Take 17 g by mouth daily.    . sertraline (ZOLOFT) 50 MG tablet Take 1 tablet (50 mg total) by mouth at bedtime. 30 tablet 5  . telmisartan-hydrochlorothiazide (MICARDIS HCT) 40-12.5 MG tablet Take 1 tablet by mouth  daily 90 tablet  1  . vitamin B-12 (CYANOCOBALAMIN) 1000 MCG tablet Take 1,000 mcg by mouth daily.       No current facility-administered medications on file prior to visit.     Past Medical History:  Diagnosis Date  . Anxiety   . Breast cancer ([place]) 13 years ago   s/p tamoxifen   . COPD (chronic obstructive pulmonary disease) ([place])   . Depression   . Diverticulosis   . Esophageal stricture   . Gastric outlet obstruction   . GERD (gastroesophageal reflux disease)   . Glaucoma   . Hiatal hernia   . Hiatal  hernia   . Hyperlipidemia   . Hypertension   . Insomnia   . Internal hemorrhoids   . Osteoarthritis   . Osteoporosis   . Peptic ulcer disease   . Rheumatic heart disease   . Ulcerative colitis     Past Surgical History:  Procedure Laterality Date  . ABDOMINAL HYSTERECTOMY    . BREAST RECONSTRUCTION  2000   right  . BREAST SURGERY     right  . CATARACT EXTRACTION  08/2006 and 10/2006  . HEMORRHOID SURGERY    . HIATAL HERNIA REPAIR N/A 09/03/2012   Procedure: LAPAROSCOPIC REPAIR OF HIATAL HERNIA;  Surgeon: Pedro Earls, MD;  [facility];  Service: General;  Laterality: N/A;  . JOINT REPLACEMENT     right  . LAPAROSCOPIC NISSEN FUNDOPLICATION N/A 4/40/3474   Procedure: LAP REPAIR OF LARGE TYPE III MIXED HIATUS HERNIA WITH NISSEN FUNOPLICATION;  Surgeon: Pedro Earls, MD;  Location: [facility];  Service: General;  Laterality: N/A;  . [place]   right  . TONSILLECTOMY    . TOTAL KNEE ARTHROPLASTY     left    Social History   Social History  . Marital status: Widowed    Spouse name: N/A  . Number of children: 3  . Years of education: N/A   Occupational History  . retired-sales Retired   Social History Main Topics  . Smoking status: Never Smoker  . Smokeless tobacco: Never Used  . Alcohol use No  . Drug use: No  . Sexual activity: Not Asked   Other Topics Concern  . None   Social History Narrative  . None    Family History  Problem Relation Age of Onset  . Coronary artery disease Sister        x 2 in 76's  . Coronary artery disease Brother        x 2 in 70's  . Diabetes Unknown   . Colon cancer Father 77  . Hypertension Unknown   . Stroke Mother 81  . Crohn's disease Brother   . Breast cancer Unknown        Aunt  . Cirrhosis Brother   . Liver disease Son     Review of Systems  Constitutional: Negative for fever.  Respiratory: Negative for cough, shortness of breath and wheezing.   Cardiovascular: Negative for chest pain,  palpitations and leg swelling.  Musculoskeletal: Positive for joint swelling (left knee).  Neurological: Positive for dizziness (one episode). Negative for headaches.       Objective:   Vitals:   09/19/16 1127  BP: 138/82  Pulse: 74  Resp: 16  Temp: 98 F (36.7 C)   Wt Readings from Last 3 Encounters:  09/19/16 169 lb (76.7 kg)  06/20/16 168 lb (76.2 kg)  06/11/16 163 lb 8 oz (74.2 kg)   Body mass index is 27.28 kg/m.  Physical Exam    Constitutional: Appears well-developed and well-nourished. No distress.  HENT:  Head: Normocephalic and atraumatic.  Neck: Neck supple. No tracheal deviation present. No thyromegaly present.  No cervical lymphadenopathy Cardiovascular: Normal rate, regular rhythm and normal heart sounds.   No murmur heard. No carotid bruit .  No edema Pulmonary/Chest: Effort normal and breath sounds normal. No respiratory distress. No has no wheezes. No rales.  Skin: Skin is warm and dry. Not diaphoretic.  Psychiatric: depressed mood and affect. Behavior is normal.      Assessment & Plan:    See Problem List for Assessment and Plan of chronic medical problems.

## 2016-09-19 ENCOUNTER — Ambulatory Visit (INDEPENDENT_AMBULATORY_CARE_PROVIDER_SITE_OTHER): Payer: Medicare Other | Admitting: Internal Medicine

## 2016-09-19 ENCOUNTER — Encounter: Payer: Self-pay | Admitting: Internal Medicine

## 2016-09-19 ENCOUNTER — Other Ambulatory Visit (INDEPENDENT_AMBULATORY_CARE_PROVIDER_SITE_OTHER): Payer: Medicare Other

## 2016-09-19 VITALS — BP 138/82 | HR 74 | Temp 98.0°F | Resp 16 | Wt 169.0 lb

## 2016-09-19 DIAGNOSIS — K219 Gastro-esophageal reflux disease without esophagitis: Secondary | ICD-10-CM

## 2016-09-19 DIAGNOSIS — I10 Essential (primary) hypertension: Secondary | ICD-10-CM

## 2016-09-19 DIAGNOSIS — I779 Disorder of arteries and arterioles, unspecified: Secondary | ICD-10-CM

## 2016-09-19 DIAGNOSIS — R7303 Prediabetes: Secondary | ICD-10-CM

## 2016-09-19 DIAGNOSIS — M810 Age-related osteoporosis without current pathological fracture: Secondary | ICD-10-CM | POA: Diagnosis not present

## 2016-09-19 DIAGNOSIS — F329 Major depressive disorder, single episode, unspecified: Secondary | ICD-10-CM

## 2016-09-19 DIAGNOSIS — E7800 Pure hypercholesterolemia, unspecified: Secondary | ICD-10-CM

## 2016-09-19 DIAGNOSIS — E041 Nontoxic single thyroid nodule: Secondary | ICD-10-CM | POA: Diagnosis not present

## 2016-09-19 DIAGNOSIS — F419 Anxiety disorder, unspecified: Secondary | ICD-10-CM

## 2016-09-19 DIAGNOSIS — I739 Peripheral vascular disease, unspecified: Secondary | ICD-10-CM

## 2016-09-19 LAB — COMPREHENSIVE METABOLIC PANEL
ALT: 9 U/L (ref 0–35)
AST: 14 U/L (ref 0–37)
Albumin: 4.4 g/dL (ref 3.5–5.2)
Alkaline Phosphatase: 93 U/L (ref 39–117)
BUN: 19 mg/dL (ref 6–23)
CALCIUM: 10.2 mg/dL (ref 8.4–10.5)
CHLORIDE: 103 meq/L (ref 96–112)
CO2: 29 mEq/L (ref 19–32)
CREATININE: 0.77 mg/dL (ref 0.40–1.20)
GFR: 75.97 mL/min (ref >60.00)
Glucose, Bld: 127 mg/dL — ABNORMAL HIGH (ref 70–99)
POTASSIUM: 4.3 meq/L (ref 3.5–5.1)
Sodium: 140 mEq/L (ref 135–145)
Total Bilirubin: 0.9 mg/dL (ref 0.2–1.2)
Total Protein: 7.8 g/dL (ref 6.0–8.3)

## 2016-09-19 LAB — LIPID PANEL
CHOLESTEROL: 210 mg/dL — AB (ref 0–200)
HDL: 52.7 mg/dL (ref >39.00)
LDL CALC: 129 mg/dL — AB (ref 0–99)
NonHDL: 157.15
TRIGLYCERIDES: 141 mg/dL (ref 0.0–149.0)
Total CHOL/HDL Ratio: 4
VLDL: 28.2 mg/dL (ref 0.0–40.0)

## 2016-09-19 LAB — TSH: TSH: 3.43 u[IU]/mL (ref 0.35–4.50)

## 2016-09-19 LAB — CBC WITH DIFFERENTIAL/PLATELET
BASOS PCT: 0.8 % (ref 0.0–3.0)
Basophils Absolute: 0.1 10*3/uL (ref 0.0–0.1)
EOS ABS: 0.1 10*3/uL (ref 0.0–0.7)
EOS PCT: 0.9 % (ref 0.0–5.0)
HEMATOCRIT: 42.2 % (ref 36.0–46.0)
Hemoglobin: 14.2 g/dL (ref 12.0–15.0)
LYMPHS PCT: 16.5 % (ref 12.0–46.0)
Lymphs Abs: 1.1 10*3/uL (ref 0.7–4.0)
MCHC: 33.6 g/dL (ref 30.0–36.0)
MCV: 96.8 fl (ref 78.0–100.0)
MONO ABS: 0.3 10*3/uL (ref 0.1–1.0)
Monocytes Relative: 5.1 % (ref 3.0–12.0)
NEUTROS ABS: 5.2 10*3/uL (ref 1.4–7.7)
Neutrophils Relative %: 76.7 % (ref 43.0–77.0)
Platelets: 245 10*3/uL (ref 150.0–400.0)
RBC: 4.36 Mil/uL (ref 3.87–5.11)
RDW: 13.6 % (ref 11.5–15.5)
WBC: 6.7 10*3/uL (ref 4.0–10.5)

## 2016-09-19 LAB — HEMOGLOBIN A1C: HEMOGLOBIN A1C: 6.4 % (ref 4.6–6.5)

## 2016-09-19 NOTE — Assessment & Plan Note (Signed)
Does not want to pursue any treatment at this time encouraged increased activity

## 2016-09-19 NOTE — Assessment & Plan Note (Signed)
Thyroid US ordered - she has not had one

## 2016-09-19 NOTE — Assessment & Plan Note (Signed)
Check a1c Low sugar / carb diet Stressed regular exercise, keeping weight down  

## 2016-09-19 NOTE — Assessment & Plan Note (Signed)
Due for follow up US - ordered

## 2016-09-19 NOTE — Assessment & Plan Note (Signed)
BP Readings from Last 3 Encounters:  09/19/16 138/82  06/20/16 132/80  06/11/16 114/70   BP well controlled Current regimen effective and well tolerated Continue current medications at current doses cmp

## 2016-09-19 NOTE — Assessment & Plan Note (Signed)
Anxiety and depression not ideally controlled Increasing medication will likely not help - she feels her medications now do help a little No changes today She is looking into moving to a retirement community which I think will help a lot

## 2016-09-19 NOTE — Assessment & Plan Note (Signed)
Check lipids 

## 2016-09-19 NOTE — Assessment & Plan Note (Signed)
GERD controlled Continue daily medication  

## 2016-09-28 ENCOUNTER — Other Ambulatory Visit: Payer: Self-pay | Admitting: Emergency Medicine

## 2016-09-28 MED ORDER — SERTRALINE HCL 50 MG PO TABS: 50.0000 mg | ORAL_TABLET | Freq: Every day | ORAL | 0 refills | Status: DC

## 2016-09-28 MED ORDER — TELMISARTAN-HCTZ 40-12.5 MG PO TABS: 1.0000 | ORAL_TABLET | Freq: Every day | ORAL | 3 refills | Status: DC

## 2016-11-02 ENCOUNTER — Ambulatory Visit
Admission: RE | Admit: 2016-11-02 | Discharge: 2016-11-02 | Disposition: A | Discharge status: Home / Self Care | Payer: Medicare Other | Source: Ambulatory Visit | Attending: Internal Medicine | Admitting: Internal Medicine

## 2016-11-02 ENCOUNTER — Ambulatory Visit (HOSPITAL_COMMUNITY)
Admission: RE | Admit: 2016-11-02 | Discharge: 2016-11-02 | Disposition: A | Discharge status: Home / Self Care | Payer: Medicare Other | Source: Ambulatory Visit | Attending: Cardiology | Admitting: Cardiology

## 2016-11-02 DIAGNOSIS — I739 Peripheral vascular disease, unspecified: Secondary | ICD-10-CM

## 2016-11-02 DIAGNOSIS — E042 Nontoxic multinodular goiter: Secondary | ICD-10-CM | POA: Diagnosis not present

## 2016-11-02 DIAGNOSIS — E041 Nontoxic single thyroid nodule: Secondary | ICD-10-CM

## 2016-11-02 DIAGNOSIS — I779 Disorder of arteries and arterioles, unspecified: Secondary | ICD-10-CM | POA: Insufficient documentation

## 2016-11-13 ENCOUNTER — Other Ambulatory Visit: Payer: Self-pay | Admitting: Internal Medicine

## 2016-11-21 ENCOUNTER — Telehealth: Payer: Self-pay | Admitting: Internal Medicine

## 2016-11-21 ENCOUNTER — Other Ambulatory Visit: Payer: Self-pay | Admitting: Internal Medicine

## 2016-11-21 NOTE — Telephone Encounter (Signed)
Pt is needing her 90 day supply of her  sertraline (ZOLOFT) 50 MG tablet [[id number]]   sent to mail order and needs a small amount sent to her local pharmacy until she can get her mail order  [facility] on [address]?

## 2016-11-22 ENCOUNTER — Other Ambulatory Visit: Payer: Self-pay | Admitting: Internal Medicine

## 2016-11-22 MED ORDER — SERTRALINE HCL 50 MG PO TABS: 50.0000 mg | ORAL_TABLET | Freq: Every day | ORAL | 1 refills | Status: DC

## 2016-11-22 MED ORDER — SERTRALINE HCL 50 MG PO TABS: 50.0000 mg | ORAL_TABLET | Freq: Every day | ORAL | 0 refills | Status: DC

## 2016-11-22 NOTE — Telephone Encounter (Signed)
Spoke with pt, she took her last tablet on 11/20/16. She has recently moved and would like the new RXs sent. Notified pt that they have now been sent.

## 2016-12-13 ENCOUNTER — Other Ambulatory Visit: Payer: Self-pay | Admitting: Internal Medicine

## 2016-12-13 ENCOUNTER — Other Ambulatory Visit: Payer: Self-pay | Admitting: Acute Care

## 2016-12-13 DIAGNOSIS — F411 Generalized anxiety disorder: Secondary | ICD-10-CM

## 2016-12-13 NOTE — Telephone Encounter (Signed)
[place] Controlled Substance Database checked. Last filled on 06/20/16

## 2016-12-14 NOTE — Telephone Encounter (Signed)
[facility]ed [facility]

## 2016-12-18 ENCOUNTER — Other Ambulatory Visit: Payer: Self-pay | Admitting: Internal Medicine

## 2016-12-20 ENCOUNTER — Telehealth: Payer: Self-pay | Admitting: Internal Medicine

## 2016-12-20 ENCOUNTER — Telehealth: Payer: Self-pay | Admitting: Acute Care

## 2016-12-20 MED ORDER — PANTOPRAZOLE SODIUM 40 MG PO TBEC: 40.0000 mg | DELAYED_RELEASE_TABLET | Freq: Every day | ORAL | 0 refills | Status: DC

## 2016-12-20 NOTE — Telephone Encounter (Signed)
Pt called for a refill of her pantoprazole (PROTONIX) 40 MG tablet  Please refer to phone note from earlier today from [place] office Please advise [facility] on File

## 2016-12-20 NOTE — Telephone Encounter (Signed)
Notified pt rx sent to [facility].../lmb 

## 2016-12-20 NOTE — Telephone Encounter (Signed)
Called pt regarding receiving a Rx request of her Pantoprazole 40mg . Pt has not been seen at office since 09/17/15 and told her that in order for [name] to be able to refill this med for her, she would have to schedule an OV with [name] since it has been over a year since we last saw her. I told pt that she could contact her PCP and have them refill this med for her if she did not want to schedule an OV with [name]. Pt expressed understanding and stated that she will contact her PCP later to have them refill her Pantoprazole.  Nothing further needed at this time.

## 2016-12-26 ENCOUNTER — Telehealth: Payer: Self-pay | Admitting: Internal Medicine

## 2016-12-26 NOTE — Telephone Encounter (Signed)
She will need an appt for me to fill out forms - some questions require an exam to note physical limitations.  Please call her to schedule.

## 2016-12-26 NOTE — Telephone Encounter (Signed)
Son would like to know if forms were received today for [place] assistance to help pay retirement home bills.

## 2016-12-26 NOTE — Telephone Encounter (Signed)
Forms were received today, given to DR Quay Burow to complete.

## 2016-12-27 NOTE — Telephone Encounter (Signed)
Patient has an appointment on Thursday 9/13

## 2016-12-28 ENCOUNTER — Ambulatory Visit: Payer: Medicare Other | Admitting: Internal Medicine

## 2016-12-28 NOTE — Progress Notes (Signed)
Subjective:    Patient ID: [name], female    DOB: 08/21/1930, 83 y.o.   MRN: [id number]  HPI The patient is here for follow up.  Hypertension: She is taking her medication daily. She is compliant with a low sodium diet.  She feels her BP is higher at the doctor's.  She denies chest pain, shortness of breath and regular headaches. She is exercising regularly - walking the hallways.  She does not monitor her blood pressure at home.    Anxiety, Depression: She is taking her medication daily as prescribed. She denies any side effects from the medication. She feels her depression and anxiety are fairly well controlled and she is happy with her current dose of medication.   Prediabetes:  She is compliant with a low sugar/carbohydrate diet.  She is exercising regularly.  GERD:  She is taking her medication daily as prescribed.  She denies any GERD symptoms and feels her GERD is well controlled.   She moved into a retirement community.  She needs paperwork filled out for assistance from the [place].    She has normal ROM of all extremities and she feels her strength is good. Her balance is good, but not great.  She uses a cane as a caution, but can walk without it.  She has had her knees replaced.  She has been walking the halls for exercise and going up the stairs.  Urinary incontinence.  No bowel incontinence.  She is still driving.  She can do self care.    Recent her vulva has been red and raw - this last occurred 50 years ago.  It is itching.  No vaginal d/c.  She has been wearing a pad for urinary incontinence.  She has been using monistat cream and it is helping.  She has no urinary symptoms.     Medications and allergies reviewed with patient and updated if appropriate.  Patient Active Problem List   Diagnosis Date Noted  . Prediabetes 09/18/2016  . Grief 04/20/2016  . Thyroid nodule 01/19/2016  . Multiple falls 01/20/2015  . Hx MRSA infection 03/05/2013  . Lap Repair Large  Hiatal Hernia with Nissen May 2014 09/20/2012  . Large type III mixed hiatus hernia 08/16/2012  . Carotid arterial disease ([place]) 10/31/2011  . OTH SPEC ALVEOL&PARIETOALVEOL PNEUMONOPATHIES 04/28/2010  . HEMORRHOIDS-INTERNAL 03/01/2010  . GASTRIC OUTLET OBSTRUCTION 02/10/2010  . RHEUMATIC HEART DISEASE 02/02/2010  . ESOPHAGEAL STRICTURE 02/02/2010  . GERD 02/02/2010  . DUODENAL ULCER 02/02/2010  . HIATAL HERNIA 02/02/2010  . DIVERTICULOSIS, COLON 02/02/2010  . OSTEOARTHRITIS 02/02/2010  . DECREASED HEARING, BILATERAL 02/06/2008  . PEPTIC ULCER DISEASE 06/07/2007  . Hyperlipidemia 12/19/2006  . Anxiety and depression 12/19/2006  . COPD 12/19/2006  . GLAUCOMA 09/04/2006  . HTN (hypertension) 09/04/2006  . Osteoporosis 09/04/2006  . PREMATURE VENTRICULAR CONTRACTIONS 10/06/1994  . MASTECTOMY, RIGHT, HX OF 04/18/1993    Current Outpatient Prescriptions on File Prior to Visit  Medication Sig Dispense Refill  . aspirin 81 MG tablet Take 81 mg by mouth daily.      [name] azelastine (ASTELIN) 137 MCG/SPRAY nasal spray Place 1 spray into the nose 2 (two) times daily as needed (for allergies). Reported on 07/20/2015    . Biotin (BIOTIN ULTRA STRENGTH) 10 MG CAPS Take 1 capsule by mouth daily.    [name] LORazepam (ATIVAN) 2 MG tablet TAKE 1/2 TABLET BY MOUTH EVERY 8 HOURS AS NEEDED FOR ANXIETY 60 tablet 0  . pantoprazole (PROTONIX) 40  MG tablet Take 1 tablet (40 mg total) by mouth daily. 90 tablet 0  . polyethylene glycol (MIRALAX / GLYCOLAX) packet Take 17 g by mouth daily.    . sertraline (ZOLOFT) 50 MG tablet Take 1 tablet (50 mg total) by mouth at bedtime. 90 tablet 1  . telmisartan-hydrochlorothiazide (MICARDIS HCT) 40-12.5 MG tablet Take 1 tablet by mouth daily. 90 tablet 3  . vitamin B-12 (CYANOCOBALAMIN) 1000 MCG tablet Take 1,000 mcg by mouth daily.       No current facility-administered medications on file prior to visit.     Past Medical History:  Diagnosis Date  . Anxiety   . Breast  cancer ([place]) 13 years ago   s/p tamoxifen   . COPD (chronic obstructive pulmonary disease) ([place])   . Depression   . Diverticulosis   . Esophageal stricture   . Gastric outlet obstruction   . GERD (gastroesophageal reflux disease)   . Glaucoma   . Hiatal hernia   . Hiatal hernia   . Hyperlipidemia   . Hypertension   . Insomnia   . Internal hemorrhoids   . Osteoarthritis   . Osteoporosis   . Peptic ulcer disease   . Rheumatic heart disease   . Ulcerative colitis     Past Surgical History:  Procedure Laterality Date  . ABDOMINAL HYSTERECTOMY    . BREAST RECONSTRUCTION  2000   right  . BREAST SURGERY     right  . CATARACT EXTRACTION  08/2006 and 10/2006  . HEMORRHOID SURGERY    . HIATAL HERNIA REPAIR N/A 09/03/2012   Procedure: LAPAROSCOPIC REPAIR OF HIATAL HERNIA;  Surgeon: Pedro Earls, MD;  Location: [facility];  Service: General;  Laterality: N/A;  . JOINT REPLACEMENT     right  . LAPAROSCOPIC NISSEN FUNDOPLICATION N/A 3/71/6967   Procedure: LAP REPAIR OF LARGE TYPE III MIXED HIATUS HERNIA WITH NISSEN FUNOPLICATION;  Surgeon: Pedro Earls, MD;  Location[facility];  Service: General;  Laterality: N/A;  . [place]   right  . TONSILLECTOMY    . TOTAL KNEE ARTHROPLASTY     left    Social History   Social History  . Marital status: Widowed    Spouse name: N/A  . Number of children: 3  . Years of education: N/A   Occupational History  . retired-sales Retired   Social History Main Topics  . Smoking status: Never Smoker  . Smokeless tobacco: Never Used  . Alcohol use No  . Drug use: No  . Sexual activity: Not on file   Other Topics Concern  . Not on file   Social History Narrative  . No narrative on file    Family History  Problem Relation Age of Onset  . Coronary artery disease Sister        x 2 in 56's  . Coronary artery disease Brother        x 2 in 70's  . Diabetes Unknown   . Colon cancer Father 69  . Hypertension Unknown   . Stroke  Mother 67  . Crohn's disease Brother   . Breast cancer Unknown        Aunt  . Cirrhosis Brother   . Liver disease Son     Review of Systems  Constitutional: Negative for chills and fever.  Respiratory: Positive for cough. Negative for shortness of breath and wheezing.   Cardiovascular: Positive for palpitations (occasionally with stress) and leg swelling (minimal at times). Negative for chest  pain.  Neurological: Positive for dizziness (mild, infrequent - when she moves fast) and light-headedness (rare). Negative for headaches.       Objective:   Vitals:   12/29/16 1027  BP: (!) 154/86  Pulse: 62  Resp: 16  Temp: 97.7 F (36.5 C)  SpO2: 98%   Wt Readings from Last 3 Encounters:  12/29/16 163 lb (73.9 kg)  09/19/16 169 lb (76.7 kg)  06/20/16 168 lb (76.2 kg)   Body mass index is 26.31 kg/m.   Physical Exam    Constitutional: Appears well-developed and well-nourished. No distress.  HENT:  Head: Normocephalic and atraumatic.  Neck: Neck supple. No tracheal deviation present. No thyromegaly present.  No cervical lymphadenopathy Cardiovascular: Normal rate, regular rhythm and normal heart sounds.   No murmur heard. No carotid bruit .  Trace b/l LE edema. Varicosities b/l LE Pulmonary/Chest: Effort normal and breath sounds normal. No respiratory distress. No has no wheezes. No rales.  GU: deferred Msk: no contracture or muscle atrophy Neuro: normal sensation and strength all extremities Skin: Skin is warm and dry. Not diaphoretic.  Psychiatric: Normal mood and affect. Behavior is normal.      Assessment & Plan:    See Problem List for Assessment and Plan of chronic medical problems.

## 2016-12-29 ENCOUNTER — Ambulatory Visit (INDEPENDENT_AMBULATORY_CARE_PROVIDER_SITE_OTHER): Payer: Medicare Other | Admitting: Internal Medicine

## 2016-12-29 ENCOUNTER — Encounter: Payer: Self-pay | Admitting: Internal Medicine

## 2016-12-29 VITALS — BP 154/86 | HR 62 | Temp 97.7°F | Resp 16 | Ht 66.0 in | Wt 163.0 lb

## 2016-12-29 DIAGNOSIS — F419 Anxiety disorder, unspecified: Secondary | ICD-10-CM

## 2016-12-29 DIAGNOSIS — I10 Essential (primary) hypertension: Secondary | ICD-10-CM

## 2016-12-29 DIAGNOSIS — F329 Major depressive disorder, single episode, unspecified: Secondary | ICD-10-CM | POA: Diagnosis not present

## 2016-12-29 DIAGNOSIS — K219 Gastro-esophageal reflux disease without esophagitis: Secondary | ICD-10-CM | POA: Diagnosis not present

## 2016-12-29 DIAGNOSIS — R7303 Prediabetes: Secondary | ICD-10-CM | POA: Diagnosis not present

## 2016-12-29 NOTE — Patient Instructions (Addendum)
  No immunizations administered today.   Medications reviewed and updated.  No changes recommended at this time.    Please followup in 6 months   

## 2016-12-29 NOTE — Assessment & Plan Note (Signed)
GERD controlled Continue daily medication  

## 2016-12-29 NOTE — Assessment & Plan Note (Signed)
BP Readings from Last 3 Encounters:  12/29/16 (!) 154/86  09/19/16 138/82  06/20/16 132/80    BP well controlled Current regimen effective and well tolerated Continue current medications at current doses

## 2016-12-29 NOTE — Assessment & Plan Note (Signed)
Controlled, stable Continue current dose of medication  

## 2016-12-29 NOTE — Assessment & Plan Note (Signed)
Low sugar / carb diet Stressed regular exercise Will recheck a1c in 6 months

## 2017-03-06 ENCOUNTER — Other Ambulatory Visit (INDEPENDENT_AMBULATORY_CARE_PROVIDER_SITE_OTHER): Payer: Medicare Other

## 2017-03-06 ENCOUNTER — Telehealth: Payer: Self-pay | Admitting: Internal Medicine

## 2017-03-06 ENCOUNTER — Other Ambulatory Visit: Payer: Self-pay | Admitting: Emergency Medicine

## 2017-03-06 DIAGNOSIS — R300 Dysuria: Secondary | ICD-10-CM

## 2017-03-06 LAB — URINALYSIS, ROUTINE W REFLEX MICROSCOPIC
Ketones, ur: NEGATIVE
Nitrite: NEGATIVE
Specific Gravity, Urine: 1.025 (ref 1.000–1.030)
Total Protein, Urine: >=300 — AB
URINE GLUCOSE: NEGATIVE
UROBILINOGEN UA: 1 (ref 0.0–1.0)
pH: 6 (ref 5.0–8.0)

## 2017-03-06 MED ORDER — AMOXICILLIN-POT CLAVULANATE 875-125 MG PO TABS: 1.0000 | ORAL_TABLET | Freq: Two times a day (BID) | ORAL | 0 refills | Status: DC

## 2017-03-06 NOTE — Progress Notes (Signed)
Her preliminary urine results show a probable infection.  Augmentin according to her prior urine culture sensitivities should be effective-this was sent to her pharmacy.  She should call the on-call doctor over the weekend if her symptoms are not improving

## 2017-03-06 NOTE — Telephone Encounter (Signed)
[id number] [place], [place] [phone] (Phone) [phone] (Fax)   Patient has had a UTI for about a week. She would like to know if she can bring a sample in to be tested. We have no appointments at the office for the rest of the week.   Spoke with Lovena Le she informed they could put the orders in.

## 2017-03-06 NOTE — Telephone Encounter (Signed)
Orders placed.

## 2017-03-07 ENCOUNTER — Other Ambulatory Visit: Payer: Self-pay | Admitting: Emergency Medicine

## 2017-03-07 LAB — URINE CULTURE
MICRO NUMBER: 81302322
SPECIMEN QUALITY: ADEQUATE

## 2017-03-07 MED ORDER — PANTOPRAZOLE SODIUM 40 MG PO TBEC: 40.0000 mg | DELAYED_RELEASE_TABLET | Freq: Every day | ORAL | 1 refills | Status: DC

## 2017-03-14 ENCOUNTER — Telehealth: Payer: Self-pay | Admitting: Internal Medicine

## 2017-03-14 DIAGNOSIS — F411 Generalized anxiety disorder: Secondary | ICD-10-CM

## 2017-03-14 MED ORDER — LORAZEPAM 2 MG PO TABS: ORAL_TABLET | ORAL | 0 refills | Status: DC

## 2017-03-14 NOTE — Telephone Encounter (Signed)
sent 

## 2017-03-14 NOTE — Telephone Encounter (Signed)
Route to [place]

## 2017-03-14 NOTE — Telephone Encounter (Signed)
Check [place] registry last filled 12/14/2016. pls advise...Johny Chess

## 2017-03-14 NOTE — Telephone Encounter (Signed)
Copied from [place] [id number]. Topic: Quick Communication - Rx Refill/Question >> Mar 14, 2017 10:41 AM Carolyn Stare wrote: Has the patient contacted their pharmacy? {yes  no refills left on rx    (Agent: If no, request that the patient contact the pharmacy for the refill.)   Preferred Pharmacy (with phone number or street name) [facility]   LORazepam (ATIVAN) 2 MG tablet  Agent: Please be advised that RX refills may take up to 48 hours. We ask that you follow-up with your pharmacy.

## 2017-03-14 NOTE — Telephone Encounter (Signed)
Request for controlled substance 

## 2017-03-17 NOTE — Progress Notes (Addendum)
Subjective:   [name] is a 83 y.o. female who presents for Medicare Annual (Subsequent) preventive examination.  Review of Systems:  No ROS.  Medicare Wellness Visit. Additional risk factors are reflected in the social history.  Cardiac Risk Factors include: advanced age (>74men, >36 women);dyslipidemia;sedentary lifestyle Sleep patterns: gets up 1-2 times nightly to void and sleeps 6-7 hours nightly.    Home Safety/Smoke Alarms: Feels safe in home. Smoke alarms in place.  Living environment; residence and Firearm Safety: apartment, assisted living, no firearms. Lives at [facility] independent living. Seat Belt Safety/Bike Helmet: Wears seat belt.    Objective:     Vitals: BP (!) 147/76   Pulse 82   Resp 20   Ht 5\' 6"  (1.676 m)   Wt 166 lb (75.3 kg)   SpO2 98%   BMI 26.79 kg/m   Body mass index is 26.79 kg/m.  Advanced Directives 03/20/2017 07/21/2015 12/19/2014 09/03/2012 08/28/2012  Does Patient Have a Medical Advance Directive? Yes Yes Yes Patient has advance directive, copy in chart Patient has advance directive, copy in chart  Type of Advance Directive [place];Living will [place];Living will [place];Living will Healthcare Power of [place];Living will  Does patient want to make changes to medical advance directive? - No - Patient declined No - Patient declined - -  Copy of [place] in Chart? No - copy requested No - copy requested Yes - -  Pre-existing out of facility DNR order (yellow form or pink MOST form) - - - No No    Tobacco Social History   Tobacco Use  Smoking Status Never Smoker  Smokeless Tobacco Never Used     Counseling given: Not Answered   Past Medical History:  Diagnosis Date  . Anxiety   . Breast cancer ([place]) 13 years ago   s/p tamoxifen   . COPD (chronic obstructive pulmonary disease) ([place])   . Depression   . Diverticulosis   .  Esophageal stricture   . Gastric outlet obstruction   . GERD (gastroesophageal reflux disease)   . Glaucoma   . Hiatal hernia   . Hiatal hernia   . Hyperlipidemia   . Hypertension   . Insomnia   . Internal hemorrhoids   . Osteoarthritis   . Osteoporosis   . Peptic ulcer disease   . Rheumatic heart disease   . Ulcerative colitis    Past Surgical History:  Procedure Laterality Date  . ABDOMINAL HYSTERECTOMY    . BREAST RECONSTRUCTION  2000   right  . BREAST SURGERY     right  . CATARACT EXTRACTION  08/2006 and 10/2006  . HEMORRHOID SURGERY    . HIATAL HERNIA REPAIR N/A 09/03/2012   Procedure: LAPAROSCOPIC REPAIR OF HIATAL HERNIA;  Surgeon: Pedro Earls, MD;  [facility];  Service: General;  Laterality: N/A;  . JOINT REPLACEMENT     right  . LAPAROSCOPIC NISSEN FUNDOPLICATION N/A 08/26/2583   Procedure: LAP REPAIR OF LARGE TYPE III MIXED HIATUS HERNIA WITH NISSEN FUNOPLICATION;  Surgeon: Pedro Earls, MD;  [facility];  Service: General;  Laterality: N/A;  . [place]   right  . TONSILLECTOMY    . TOTAL KNEE ARTHROPLASTY     left   Family History  Problem Relation Age of Onset  . Coronary artery disease Sister        x 2 in 86's  . Coronary  artery disease Brother        x 2 in 70's  . Diabetes Unknown   . Colon cancer Father 46  . Hypertension Unknown   . Stroke Mother 55  . Crohn's disease Brother   . Breast cancer Unknown        Aunt  . Cirrhosis Brother   . Liver disease Son    Social History   Socioeconomic History  . Marital status: Widowed    Spouse name: None  . Number of children: 3  . Years of education: None  . Highest education level: None  Social Needs  . Financial resource strain: Not hard at all  . Food insecurity - worry: Never true  . Food insecurity - inability: Never true  . Transportation needs - medical: No  . Transportation needs - non-medical: No  Occupational History  . Occupation: retired-sales    Employer:  RETIRED  Tobacco Use  . Smoking status: Never Smoker  . Smokeless tobacco: Never Used  Substance and Sexual Activity  . Alcohol use: No    Alcohol/week: 0.0 oz  . Drug use: No  . Sexual activity: None  Other Topics Concern  . None  Social History Narrative  . None    Outpatient Encounter Medications as of 03/20/2017  Medication Sig  . aspirin 81 MG tablet Take 81 mg by mouth daily.    [name] azelastine (ASTELIN) 137 MCG/SPRAY nasal spray Place 1 spray into the nose 2 (two) times daily as needed (for allergies). Reported on 07/20/2015  . Biotin (BIOTIN ULTRA STRENGTH) 10 MG CAPS Take 1 capsule by mouth daily.  [name] LORazepam (ATIVAN) 2 MG tablet TAKE 1/2 TABLET BY MOUTH EVERY 8 HOURS AS NEEDED FOR ANXIETY  . pantoprazole (PROTONIX) 40 MG tablet Take 1 tablet (40 mg total) daily by mouth.  . polyethylene glycol (MIRALAX / GLYCOLAX) packet Take 17 g by mouth daily.  . sertraline (ZOLOFT) 50 MG tablet Take 1 tablet (50 mg total) by mouth at bedtime.  [name] telmisartan-hydrochlorothiazide (MICARDIS HCT) 40-12.5 MG tablet Take 1 tablet by mouth daily.  . vitamin B-12 (CYANOCOBALAMIN) 1000 MCG tablet Take 1,000 mcg by mouth daily.    . [DISCONTINUED] amoxicillin-clavulanate (AUGMENTIN) 875-125 MG tablet Take 1 tablet 2 (two) times daily by mouth. (Patient not taking: Reported on 03/20/2017)   No facility-administered encounter medications on file as of 03/20/2017.    Patient Care Team: Binnie Rail, MD as PCP - General (Internal Medicine) Gevena Cotton, MD as Consulting Physician (Ophthalmology) Lafayette Dragon, MD (Inactive) as Consulting Physician (Gastroenterology) Collene Gobble, MD as Consulting Physician (Pulmonary Disease)    Assessment:    Physical assessment deferred to PCP.  Exercise Activities and Dietary recommendations Current Exercise Habits: The patient does not participate in regular exercise at present, Exercise limited by: orthopedic condition(s) Diet (meal preparation, eat  out, water intake, caffeinated beverages, dairy products, fruits and vegetables): in general, a "healthy" diet  , on average, 2 meals per day   Discussed not skipping meals, encouraged patient to increase daily water intake.   Goals      Patient Stated   . Pt states she wants to remain independent.  (pt-stated)     Stay active/physical fit.    Prevent falls.    Help others.    Brain stimulating activity to keep mind sharp.        Other   . Patient Stated     I will become more involved with the [employer] of which I live. Stay in contact with my friends and neighbors from [place]. Call Hospice to ask about grief counseling. I will start to do hobbies that I enjoy like jigsaw puzzles and paint.       Fall Risk Fall Risk  03/20/2017 07/21/2015 01/20/2015 01/20/2015 12/19/2014  Falls in the past year? Yes No Yes Yes Yes  Comment - - - - Fell 4 months ago in the bath tub-slipped on bath rug and hit head on toilet  Number falls in past yr: 1 - 2 or more 1 1  Injury with Fall? No - No No No  Risk Factor Category  - - High Fall Risk - -  Risk for fall due to : - - Impaired balance/gait - Impaired balance/gait;Impaired vision  Follow up Falls prevention discussed - Education provided;Falls prevention discussed - Education provided   Depression Screen PHQ 2/9 Scores 03/20/2017 07/21/2015 12/19/2014 05/23/2014  PHQ - 2 Score 6 0 2 0  PHQ- 9 Score 13 - 3 -     Cognitive Function MMSE - Mini Mental State Exam 03/20/2017 12/19/2014  Orientation to time 5 5  Orientation to Place 5 5  Registration 3 3  Attention/ Calculation 4 5  Recall 1 3  Language- name 2 objects 2 2  Language- repeat 1 1  Language- follow 3 step command 3 3  Language- read & follow direction 1 1  Write a sentence 1 1  Copy design 1 1  Total score 27 30        Immunization History  Administered Date(s) Administered  . Influenza Whole 04/02/2007, 01/02/2009, 01/06/2010, 01/10/2012  . Influenza, High Dose  Seasonal PF 01/21/2013, 12/19/2014, 01/19/2016  . Influenza,inj,Quad PF,6+ Mos 12/27/2013  . PPD Test 03/05/2013  . Pneumococcal Conjugate-13 05/23/2014  . Pneumococcal Polysaccharide-23 01/30/2008   Screening Tests Health Maintenance  Topic Date Due  . TETANUS/TDAP  [birth year]18/1950  . INFLUENZA VACCINE  07/18/2017 (Originally 11/16/2016)  . DEXA SCAN  Completed  . PNA vac Low Risk Adult  Completed       Plan:    Continue doing brain stimulating activities (puzzles, reading, adult coloring books, staying active) to keep memory sharp.   Continue to eat heart healthy diet (full of fruits, vegetables, whole grains, lean protein, water--limit salt, fat, and sugar intake) and increase physical activity as tolerated.  I have personally reviewed and noted the following in the patient's chart:   . Medical and social history . Use of alcohol, tobacco or illicit drugs  . Current medications and supplements . Functional ability and status . Nutritional status . Physical activity . Advanced directives . List of other physicians . Vitals . Screenings to include cognitive, depression, and falls . Referrals and appointments  In addition, I have reviewed and discussed with patient certain preventive protocols, quality metrics, and best practice recommendations. A written personalized care plan for preventive services as well as general preventive health recommendations were provided to patient.     Michiel Cowboy, RN  03/20/2017   Medical screening examination/treatment/procedure(s) were performed by non-physician practitioner and as supervising physician I was immediately available for consultation/collaboration. I agree with above. Binnie Rail, MD

## 2017-03-20 ENCOUNTER — Ambulatory Visit (INDEPENDENT_AMBULATORY_CARE_PROVIDER_SITE_OTHER): Payer: Medicare Other | Admitting: *Deleted

## 2017-03-20 ENCOUNTER — Ambulatory Visit: Payer: Medicare Other | Admitting: Internal Medicine

## 2017-03-20 ENCOUNTER — Telehealth: Payer: Self-pay | Admitting: *Deleted

## 2017-03-20 VITALS — BP 147/76 | HR 82 | Resp 20 | Ht 66.0 in | Wt 166.0 lb

## 2017-03-20 DIAGNOSIS — Z0000 Encounter for general adult medical examination without abnormal findings: Secondary | ICD-10-CM | POA: Diagnosis not present

## 2017-03-20 NOTE — Telephone Encounter (Signed)
During AWV, patient stated that she felt depressed and wonders if the zoloft 50 mg daily is effective in treating her depression. Patient shared that she still grieves over her husband and daughter who died. She recently moved into an independent living facility, she does not get to see her children or grandchildren very often. Patient often takes 2 mg of ativan at night to help her sleep not the proscribed 1 mg. Hospice resources for grief counseling was provided, encouragement and suggestions to be more involved in [employer] senior activities were also provided during visit.

## 2017-03-20 NOTE — Telephone Encounter (Signed)
Ask her is she would like to increase the sertraline to 100 mg  - that may help with her depression.   Ideally I would like her to try to stick with only taking ativan 1 mg at night.  She can try adding melatonin.

## 2017-03-20 NOTE — Patient Instructions (Signed)
Continue doing brain stimulating activities (puzzles, reading, adult coloring books, staying active) to keep memory sharp.   Continue to eat heart healthy diet (full of fruits, vegetables, whole grains, lean protein, water--limit salt, fat, and sugar intake) and increase physical activity as tolerated.   [name] , Thank you for taking time to come for your Medicare Wellness Visit. I appreciate your ongoing commitment to your health goals. Please review the following plan we discussed and let me know if I can assist you in the future.   These are the goals we discussed: Goals      Patient Stated   . Pt states she wants to remain independent.  (pt-stated)     Stay active/physical fit.    Prevent falls.    Help others.    Brain stimulating activity to keep mind sharp.        Other   . Patient Stated     I will become more involved with the [employer] of which I live. Stay in contact with my friends and neighbors from [place]. Call Hospice to ask about grief counseling. I will start to do hobbies that I enjoy like jigsaw puzzles and paint.        This is a list of the screening recommended for you and due dates:  Health Maintenance  Topic Date Due  . Tetanus Vaccine  [birth year]18/1950  . Flu Shot  07/18/2017*  . DEXA scan (bone density measurement)  Completed  . Pneumonia vaccines  Completed  *Topic was postponed. The date shown is not the original due date.

## 2017-03-21 MED ORDER — SERTRALINE HCL 100 MG PO TABS: 100.0000 mg | ORAL_TABLET | Freq: Every day | ORAL | 3 refills | Status: DC

## 2017-03-21 NOTE — Telephone Encounter (Signed)
Called and spoke to patient about increasing her zoloft to 100 mg daily and taking melatonin. Patient stated that she would like to increase zoloft to 100 mg and stated that she has tried melatonin in the past, it did not work at all to help her sleep. Nurse discussed with patient to try to not take the 2 mg of ativan if at all possible. Patient verbalized understanding. Nurse [facility] as instructed by patient.

## 2017-03-21 NOTE — Telephone Encounter (Signed)
noted 

## 2017-05-15 ENCOUNTER — Other Ambulatory Visit: Payer: Self-pay | Admitting: Internal Medicine

## 2017-05-15 DIAGNOSIS — F411 Generalized anxiety disorder: Secondary | ICD-10-CM

## 2017-05-15 NOTE — Telephone Encounter (Signed)
Check [place] registry last filled 03/14/2017../lmb 

## 2017-06-25 NOTE — Patient Instructions (Addendum)

## 2017-06-25 NOTE — Progress Notes (Signed)
Subjective:    Patient ID: [name], female    DOB: 09-23-1930, 84 y.o.   MRN: [id number]  HPI The patient is here for follow up.  Prediabetes:  She is compliant with a low sugar/carbohydrate diet.  She is not exercising regularly, but tries to walk a little where she lives.  .  Depression, anxiety: She is not taking her zoloft.  It was affecting her sleep negatively. She is taking the lorazepam nightly and it does not work as well.  She does not sleep.  She does not feel like she needs anything for depression.    Hypertension: She is taking her medication daily. She is compliant with a low sodium diet.  She denies chest pain, palpitations, edema, shortness of breath and regular headaches. She is not exercising regularly.      GERD:  She is taking her medication daily as prescribed.  She denies any GERD symptoms and feels her GERD is well controlled.   She has been coughing.  She does not bring up anything.  She denies fever, sob and wheeze.    Osteoporosis:  She is due for a dexa scan.  She did not tolerate bisphosphonate's.  She does not want any other medication.   Medications and allergies reviewed with patient and updated if appropriate.  Patient Active Problem List   Diagnosis Date Noted  . Prediabetes 09/18/2016  . Grief 04/20/2016  . Thyroid nodule 01/19/2016  . Multiple falls 01/20/2015  . Hx MRSA infection 03/05/2013  . Lap Repair Large Hiatal Hernia with Nissen May 2014 09/20/2012  . Large type III mixed hiatus hernia 08/16/2012  . Carotid arterial disease ([place]) 10/31/2011  . OTH SPEC ALVEOL&PARIETOALVEOL PNEUMONOPATHIES 04/28/2010  . HEMORRHOIDS-INTERNAL 03/01/2010  . GASTRIC OUTLET OBSTRUCTION 02/10/2010  . RHEUMATIC HEART DISEASE 02/02/2010  . ESOPHAGEAL STRICTURE 02/02/2010  . GERD 02/02/2010  . DUODENAL ULCER 02/02/2010  . HIATAL HERNIA 02/02/2010  . DIVERTICULOSIS, COLON 02/02/2010  . OSTEOARTHRITIS 02/02/2010  . DECREASED HEARING, BILATERAL  02/06/2008  . PEPTIC ULCER DISEASE 06/07/2007  . Hyperlipidemia 12/19/2006  . Anxiety and depression 12/19/2006  . COPD 12/19/2006  . GLAUCOMA 09/04/2006  . HTN (hypertension) 09/04/2006  . Osteoporosis 09/04/2006  . PREMATURE VENTRICULAR CONTRACTIONS 10/06/1994  . MASTECTOMY, RIGHT, HX OF 04/18/1993    Current Outpatient Medications on File Prior to Visit  Medication Sig Dispense Refill  . aspirin 81 MG tablet Take 81 mg by mouth daily.      [name] azelastine (ASTELIN) 137 MCG/SPRAY nasal spray Place 1 spray into the nose 2 (two) times daily as needed (for allergies). Reported on 07/20/2015    . Biotin (BIOTIN ULTRA STRENGTH) 10 MG CAPS Take 1 capsule by mouth daily.    [name] LORazepam (ATIVAN) 2 MG tablet TAKE 1/2 TABLET BY MOUTH EVERY 8 HOURS AS NEEDED FOR ANXIETY 60 tablet 0  . pantoprazole (PROTONIX) 40 MG tablet Take 1 tablet (40 mg total) daily by mouth. 90 tablet 1  . polyethylene glycol (MIRALAX / GLYCOLAX) packet Take 17 g by mouth daily.    [name] telmisartan-hydrochlorothiazide (MICARDIS HCT) 40-12.5 MG tablet Take 1 tablet by mouth daily. 90 tablet 3  . vitamin B-12 (CYANOCOBALAMIN) 1000 MCG tablet Take 1,000 mcg by mouth daily.       No current facility-administered medications on file prior to visit.     Past Medical History:  Diagnosis Date  . Anxiety   . Breast cancer ([place]) 13 years ago   s/p tamoxifen   .  COPD (chronic obstructive pulmonary disease) ([place])   . Depression   . Diverticulosis   . Esophageal stricture   . Gastric outlet obstruction   . GERD (gastroesophageal reflux disease)   . Glaucoma   . Hiatal hernia   . Hiatal hernia   . Hyperlipidemia   . Hypertension   . Insomnia   . Internal hemorrhoids   . Osteoarthritis   . Osteoporosis   . Peptic ulcer disease   . Rheumatic heart disease   . Ulcerative colitis     Past Surgical History:  Procedure Laterality Date  . ABDOMINAL HYSTERECTOMY    . BREAST RECONSTRUCTION  2000   right  . BREAST SURGERY      right  . CATARACT EXTRACTION  08/2006 and 10/2006  . HEMORRHOID SURGERY    . HIATAL HERNIA REPAIR N/A 09/03/2012   Procedure: LAPAROSCOPIC REPAIR OF HIATAL HERNIA;  Surgeon: Pedro Earls, MD;  Location: [facility];  Service: General;  Laterality: N/A;  . JOINT REPLACEMENT     right  . LAPAROSCOPIC NISSEN FUNDOPLICATION N/A 06/01/863   Procedure: LAP REPAIR OF LARGE TYPE III MIXED HIATUS HERNIA WITH NISSEN FUNOPLICATION;  Surgeon: Pedro Earls, MD;  Location: [facility];  Service: General;  Laterality: N/A;  . [place]   right  . TONSILLECTOMY    . TOTAL KNEE ARTHROPLASTY     left    Social History   Socioeconomic History  . Marital status: Widowed    Spouse name: None  . Number of children: 3  . Years of education: None  . Highest education level: None  Social Needs  . Financial resource strain: Not hard at all  . Food insecurity - worry: Never true  . Food insecurity - inability: Never true  . Transportation needs - medical: No  . Transportation needs - non-medical: No  Occupational History  . Occupation: retired-sales    Employer: RETIRED  Tobacco Use  . Smoking status: Never Smoker  . Smokeless tobacco: Never Used  Substance and Sexual Activity  . Alcohol use: No    Alcohol/week: 0.0 oz  . Drug use: No  . Sexual activity: None  Other Topics Concern  . None  Social History Narrative  . None    Family History  Problem Relation Age of Onset  . Coronary artery disease Sister        x 2 in 49's  . Coronary artery disease Brother        x 2 in 70's  . Diabetes Unknown   . Colon cancer Father 49  . Hypertension Unknown   . Stroke Mother 34  . Crohn's disease Brother   . Breast cancer Unknown        Aunt  . Cirrhosis Brother   . Liver disease Son     Review of Systems  Constitutional: Negative for chills and fever.  Respiratory: Positive for cough. Negative for shortness of breath and wheezing.   Cardiovascular: Negative for chest pain, palpitations  and leg swelling.  Neurological: Positive for dizziness. Negative for light-headedness and headaches.  Psychiatric/Behavioral: Positive for dysphoric mood (ok without medication) and sleep disturbance. The patient is nervous/anxious.        Objective:   Vitals:   06/27/17 1115  BP: 130/88  Pulse: (!) 53  Resp: 16  Temp: 97.9 F (36.6 C)  SpO2: 92%   BP Readings from Last 3 Encounters:  06/27/17 130/88  03/20/17 (!) 147/76  12/29/16 (!) 154/86  Wt Readings from Last 3 Encounters:  06/27/17 163 lb (73.9 kg)  03/20/17 166 lb (75.3 kg)  12/29/16 163 lb (73.9 kg)   Body mass index is 26.31 kg/m.   Physical Exam    Constitutional: Appears well-developed and well-nourished. No distress.  HENT:  Head: Normocephalic and atraumatic.  Neck: Neck supple. No tracheal deviation present. No thyromegaly present.  No cervical lymphadenopathy Cardiovascular: Normal rate, regular rhythm and normal heart sounds.   No murmur heard. No carotid bruit .  No edema Pulmonary/Chest: Effort normal and breath sounds normal. No respiratory distress. No has no wheezes. No rales.  Skin: Skin is warm and dry. Not diaphoretic.  Psychiatric: Normal mood and affect. Behavior is normal.      Assessment & Plan:    See Problem List for Assessment and Plan of chronic medical problems.

## 2017-06-27 ENCOUNTER — Other Ambulatory Visit (INDEPENDENT_AMBULATORY_CARE_PROVIDER_SITE_OTHER): Payer: Medicare Other

## 2017-06-27 ENCOUNTER — Ambulatory Visit (INDEPENDENT_AMBULATORY_CARE_PROVIDER_SITE_OTHER): Payer: Medicare Other | Admitting: Internal Medicine

## 2017-06-27 ENCOUNTER — Encounter: Payer: Self-pay | Admitting: Internal Medicine

## 2017-06-27 VITALS — BP 130/88 | HR 53 | Temp 97.9°F | Resp 16 | Wt 163.0 lb

## 2017-06-27 DIAGNOSIS — I10 Essential (primary) hypertension: Secondary | ICD-10-CM | POA: Diagnosis not present

## 2017-06-27 DIAGNOSIS — F329 Major depressive disorder, single episode, unspecified: Secondary | ICD-10-CM

## 2017-06-27 DIAGNOSIS — F419 Anxiety disorder, unspecified: Secondary | ICD-10-CM

## 2017-06-27 DIAGNOSIS — R7303 Prediabetes: Secondary | ICD-10-CM

## 2017-06-27 DIAGNOSIS — M810 Age-related osteoporosis without current pathological fracture: Secondary | ICD-10-CM

## 2017-06-27 DIAGNOSIS — R05 Cough: Secondary | ICD-10-CM | POA: Diagnosis not present

## 2017-06-27 DIAGNOSIS — F32A Depression, unspecified: Secondary | ICD-10-CM

## 2017-06-27 DIAGNOSIS — K219 Gastro-esophageal reflux disease without esophagitis: Secondary | ICD-10-CM | POA: Diagnosis not present

## 2017-06-27 DIAGNOSIS — R059 Cough, unspecified: Secondary | ICD-10-CM

## 2017-06-27 LAB — COMPREHENSIVE METABOLIC PANEL
ALBUMIN: 4.2 g/dL (ref 3.5–5.2)
ALK PHOS: 88 U/L (ref 39–117)
ALT: 9 U/L (ref 0–35)
AST: 13 U/L (ref 0–37)
BUN: 13 mg/dL (ref 6–23)
CO2: 32 mEq/L (ref 19–32)
CREATININE: 0.74 mg/dL (ref 0.40–1.20)
Calcium: 9.9 mg/dL (ref 8.4–10.5)
Chloride: 101 mEq/L (ref 96–112)
GFR: 79.38 mL/min (ref >60.00)
Glucose, Bld: 103 mg/dL — ABNORMAL HIGH (ref 70–99)
Potassium: 3.7 mEq/L (ref 3.5–5.1)
SODIUM: 140 meq/L (ref 135–145)
TOTAL PROTEIN: 7.7 g/dL (ref 6.0–8.3)
Total Bilirubin: 0.6 mg/dL (ref 0.2–1.2)

## 2017-06-27 LAB — HEMOGLOBIN A1C: HEMOGLOBIN A1C: 6.3 % (ref 4.6–6.5)

## 2017-06-27 MED ORDER — TELMISARTAN-HCTZ 40-12.5 MG PO TABS: 1.0000 | ORAL_TABLET | Freq: Every day | ORAL | 3 refills | Status: DC

## 2017-06-27 NOTE — Assessment & Plan Note (Signed)
Declined dexa Doing some walking Does not want to consider medication for OP

## 2017-06-27 NOTE — Assessment & Plan Note (Addendum)
BP well controlled Current regimen effective and well tolerated Continue current medications at current doses cmp  

## 2017-06-27 NOTE — Assessment & Plan Note (Signed)
Lungs clear, no concerning symptoms Symptomatic treatment Call if cough does not improve/resolve

## 2017-06-27 NOTE — Assessment & Plan Note (Signed)
Check a1c Low sugar / carb diet Stressed regular exercise   

## 2017-06-27 NOTE — Assessment & Plan Note (Signed)
Denies depression Has some anxiety - take ativan at night and sometimes during day

## 2017-06-27 NOTE — Assessment & Plan Note (Signed)
GERD controlled Continue daily medication  

## 2017-07-17 ENCOUNTER — Other Ambulatory Visit: Payer: Self-pay | Admitting: Internal Medicine

## 2017-07-17 DIAGNOSIS — F411 Generalized anxiety disorder: Secondary | ICD-10-CM

## 2017-07-17 NOTE — Telephone Encounter (Signed)
[place] Controlled Substance Database checked. Last filled on 05/15/17 

## 2017-07-18 ENCOUNTER — Telehealth: Payer: Self-pay | Admitting: Internal Medicine

## 2017-07-18 NOTE — Telephone Encounter (Signed)
Copied from [place] [phone]. Topic: Quick Communication - See Telephone Encounter >> Jul 18, 2017  2:33 PM Boyd Kerbs wrote: CRM for notification.   Lurline Hare [phone] needs updated med list faxed There is a question on her taking zoloft. She is saying she does not take it.  Fax # [phone]  See Telephone encounter for: 07/18/17.

## 2017-07-19 NOTE — Telephone Encounter (Signed)
Med list faxed to [facility]

## 2017-07-20 DIAGNOSIS — H5460 Unqualified visual loss, one eye, unspecified: Secondary | ICD-10-CM | POA: Diagnosis not present

## 2017-07-20 DIAGNOSIS — Z961 Presence of intraocular lens: Secondary | ICD-10-CM | POA: Diagnosis not present

## 2017-07-20 DIAGNOSIS — H538 Other visual disturbances: Secondary | ICD-10-CM | POA: Diagnosis not present

## 2017-07-20 DIAGNOSIS — H47019 Ischemic optic neuropathy, unspecified eye: Secondary | ICD-10-CM | POA: Diagnosis not present

## 2017-07-25 ENCOUNTER — Other Ambulatory Visit: Payer: Self-pay | Admitting: Internal Medicine

## 2017-08-03 DIAGNOSIS — N390 Urinary tract infection, site not specified: Secondary | ICD-10-CM | POA: Diagnosis not present

## 2017-08-15 ENCOUNTER — Other Ambulatory Visit: Payer: Self-pay | Admitting: Internal Medicine

## 2017-08-15 ENCOUNTER — Ambulatory Visit: Payer: Self-pay

## 2017-08-15 ENCOUNTER — Telehealth: Payer: Self-pay | Admitting: Internal Medicine

## 2017-08-15 MED ORDER — SERTRALINE HCL 50 MG PO TABS: 50.0000 mg | ORAL_TABLET | Freq: Every day | ORAL | 3 refills | Status: DC

## 2017-08-15 MED ORDER — SERTRALINE HCL 100 MG PO TABS: 100.0000 mg | ORAL_TABLET | Freq: Every day | ORAL | 3 refills | Status: DC

## 2017-08-15 NOTE — Telephone Encounter (Signed)
LVM informing pt RX has been [facility] [place].

## 2017-08-15 NOTE — Telephone Encounter (Signed)
Patient's granddaughter called in to get the patient's zoloft refilled. The PEC Agent was talking to the patient because the granddaughter is not on the DPR and felt the patient needed to talk with NT about her depression. The call was transferred and I spoke with the patient. She says "I don't know why I have to talk to you, nothing is going on with me except crying. This is something I've always done when I'm upset or sad." I asked about the Zoloft, she says "I was on it for a long time and didn't notice any difference. So I told Dr. Quay Burow that I stopped taking it. I still cried while I was on it." I asked why does her granddaughter think she needs it, what has changed? She says "I guess because I cry. I moved away from my home in [place] because I couldn't be there alone and moved to [place]. I am alone her in a retirement community. I left my friends in my neighborhood and that makes me sad to think about. I have made a few friends here, but it's not the same. I have been here for about 7 months." I advised that the Zoloft may help her mood and not cry so often, with the recent changes in her life. I explained that the medicine will not work overnight and she still may cry on the medicine, but given some time she will notice a change in her mood and not cry as often. I asked would she be willing to try it again, she says "yes, I will try." I asked about an appointment to discuss this with Dr. Quay Burow, she says "I have an appointment on May 15th for vaginal burning. I have been having this problem for a while and at first the doctor thought it was the bladder infection I had. But, I had that last year and the burning is still there. So, I know it's something else." I asked about checking to see if I could schedule something sooner for the vaginal bleeding. She says "I better just keep that appointment because my niece will have to bring me and that's when she's available. I can deal with it a little while  longer." I advised that if the burning gets to be unbearable, to call the office back to schedule an earlier appointment, she verbalized understanding.   Reason for Disposition . Health Information question, no triage required and triager able to answer question  Answer Assessment - Initial Assessment Questions 1. REASON FOR CALL or QUESTION: "What is your reason for calling today?" or "How can I best help you?" or "What question do you have that I can help answer?"     I really don't know why I'm talking to you, my granddaughter called because I cry a lot.  Protocols used: INFORMATION ONLY CALL-A-AH

## 2017-08-15 NOTE — Telephone Encounter (Signed)
Patient's niece Sheppard Evens [phone]) called stating that the patient needs a refill of Zoloft. Zoloft is not listed on patient's current medication list. Please call patient.

## 2017-08-15 NOTE — Telephone Encounter (Signed)
Sertraline prescription pending-advised we will start at the lower dose, but can increase at her next visit

## 2017-08-15 NOTE — Telephone Encounter (Signed)
Patient is willing to try the Zoloft again, see triage encounter for explanation of the need for Zoloft.  Last OV:06/27/17; Upcoming appt 08/30/17 GHW:[id number] [id number] [place] [place] [phone] (Phone) [phone] (Fax)

## 2017-08-15 NOTE — Telephone Encounter (Signed)
Per 06/27/17 OV pt was not taking her Zoloft bc it was affecting her sleep. Pt states she would like to resume.Faythe Ghee to send in refill?

## 2017-08-15 NOTE — Telephone Encounter (Signed)
Thi has been sent to Dr Quay Burow via CRM/ phone message.

## 2017-08-24 DIAGNOSIS — H5460 Unqualified visual loss, one eye, unspecified: Secondary | ICD-10-CM | POA: Diagnosis not present

## 2017-08-24 DIAGNOSIS — H47019 Ischemic optic neuropathy, unspecified eye: Secondary | ICD-10-CM | POA: Diagnosis not present

## 2017-08-29 NOTE — Progress Notes (Signed)
Subjective:    Patient ID: [name], female    DOB: [birth year], 84 y.o.   MRN: [id number]  HPI The patient is here for an acute visit.   Vaginal burning:  She was seen at urgent care and this has resolved with medication.  She was treated for a UTI and given some other medications.  She denies any urinary or vaginal symptoms since.    Depression: She is taking her medication daily as prescribed. She denies any side effects from the medication. She feels her depression and anxiety has improved a little.  Her niece feels her depression and anxiety have improved as well, but they are still not well controlled.   She has some anxiety at times.  She gets frustrated.  She is unsure if she is depressed.  Her niece did see a difference when she started back on the sertraline and feels she is still depressed and anxious.    Insomnia:  This is a chronic problem since she moved to the retirement community.  She watches tv or reads and at 10:30 or 11 and tries to go to sleep, but cant.  Sometimes gets to sleep early morning like 5 am and will sleep for a couple of hours.    The past few days she took 1 mg of ativan twice daily and sleep one night but not the other two.  For years she has taken 1 mg of ativan at night and it did work until she moved several months ago.    Hypertension: She is taking her medication daily. She is not compliant with a low sodium diet.  She denies chest pain, palpitations, edema, shortness of breath and regular headaches. She is exercising regularly -  Walks the hallways.       Medications and allergies reviewed with patient and updated if appropriate.  Patient Active Problem List   Diagnosis Date Noted  . Insomnia 08/30/2017  . Adjustment disorder with mixed anxiety and depressed mood 08/30/2017  . Prediabetes 09/18/2016  . Grief 04/20/2016  . Thyroid nodule 01/19/2016  . Multiple falls 01/20/2015  . Hx MRSA infection 03/05/2013  . Lap Repair Large Hiatal  Hernia with Nissen May 2014 09/20/2012  . Cough 09/13/2012  . Large type III mixed hiatus hernia 08/16/2012  . Carotid arterial disease ([place]) 10/31/2011  . OTH SPEC ALVEOL&PARIETOALVEOL PNEUMONOPATHIES 04/28/2010  . HEMORRHOIDS-INTERNAL 03/01/2010  . GASTRIC OUTLET OBSTRUCTION 02/10/2010  . RHEUMATIC HEART DISEASE 02/02/2010  . ESOPHAGEAL STRICTURE 02/02/2010  . GERD 02/02/2010  . DUODENAL ULCER 02/02/2010  . HIATAL HERNIA 02/02/2010  . DIVERTICULOSIS, COLON 02/02/2010  . OSTEOARTHRITIS 02/02/2010  . DECREASED HEARING, BILATERAL 02/06/2008  . PEPTIC ULCER DISEASE 06/07/2007  . Hyperlipidemia 12/19/2006  . Anxiety and depression 12/19/2006  . COPD 12/19/2006  . GLAUCOMA 09/04/2006  . HTN (hypertension) 09/04/2006  . Osteoporosis 09/04/2006  . PREMATURE VENTRICULAR CONTRACTIONS 10/06/1994  . MASTECTOMY, RIGHT, HX OF 04/18/1993    Current Outpatient Medications on File Prior to Visit  Medication Sig Dispense Refill  . aspirin 81 MG tablet Take 81 mg by mouth daily.      [name] azelastine (ASTELIN) 137 MCG/SPRAY nasal spray Place 1 spray into the nose 2 (two) times daily as needed (for allergies). Reported on 07/20/2015    . Biotin (BIOTIN ULTRA STRENGTH) 10 MG CAPS Take 1 capsule by mouth daily.    [name] LORazepam (ATIVAN) 2 MG tablet TAKE 1/2 TABLET BY MOUTH EVERY 8 HOURS AS NEEDED FOR ANXIETY 60  tablet 0  . pantoprazole (PROTONIX) 40 MG tablet TAKE 1 TABLET BY MOUTH  DAILY 90 tablet 1  . polyethylene glycol (MIRALAX / GLYCOLAX) packet Take 17 g by mouth daily.    [name] telmisartan-hydrochlorothiazide (MICARDIS HCT) 40-12.5 MG tablet Take 1 tablet by mouth daily. 90 tablet 3  . vitamin B-12 (CYANOCOBALAMIN) 1000 MCG tablet Take 1,000 mcg by mouth daily.       No current facility-administered medications on file prior to visit.     Past Medical History:  Diagnosis Date  . Anxiety   . Breast cancer ([place]) 13 years ago   s/p tamoxifen   . COPD (chronic obstructive pulmonary disease) ([place])     . Depression   . Diverticulosis   . Esophageal stricture   . Gastric outlet obstruction   . GERD (gastroesophageal reflux disease)   . Glaucoma   . Hiatal hernia   . Hiatal hernia   . Hyperlipidemia   . Hypertension   . Insomnia   . Internal hemorrhoids   . Osteoarthritis   . Osteoporosis   . Peptic ulcer disease   . Rheumatic heart disease   . Ulcerative colitis     Past Surgical History:  Procedure Laterality Date  . ABDOMINAL HYSTERECTOMY    . BREAST RECONSTRUCTION  2000   right  . BREAST SURGERY     right  . CATARACT EXTRACTION  08/2006 and 10/2006  . HEMORRHOID SURGERY    . HIATAL HERNIA REPAIR N/A 09/03/2012   Procedure: LAPAROSCOPIC REPAIR OF HIATAL HERNIA;  Surgeon: Pedro Earls, MD;  Location: [facility];  Service: General;  Laterality: N/A;  . JOINT REPLACEMENT     right  . LAPAROSCOPIC NISSEN FUNDOPLICATION N/A 10/06/3084   Procedure: LAP REPAIR OF LARGE TYPE III MIXED HIATUS HERNIA WITH NISSEN FUNOPLICATION;  Surgeon: Pedro Earls, MD;  Location: [facility];  Service: General;  Laterality: N/A;  . [place]   right  . TONSILLECTOMY    . TOTAL KNEE ARTHROPLASTY     left    Social History   Socioeconomic History  . Marital status: Widowed    Spouse name: Not on file  . Number of children: 3  . Years of education: Not on file  . Highest education level: Not on file  Occupational History  . Occupation: retired-sales    Employer: RETIRED  Social Needs  . Financial resource strain: Not hard at all  . Food insecurity:    Worry: Never true    Inability: Never true  . Transportation needs:    Medical: No    Non-medical: No  Tobacco Use  . Smoking status: Never Smoker  . Smokeless tobacco: Never Used  Substance and Sexual Activity  . Alcohol use: No    Alcohol/week: 0.0 oz  . Drug use: No  . Sexual activity: Not on file  Lifestyle  . Physical activity:    Days per week: 0 days    Minutes per session: 0 min  . Stress: Not on file   Relationships  . Social connections:    Talks on phone: Not on file    Gets together: Not on file    Attends religious service: Not on file    Active member of club or organization: Not on file    Attends meetings of clubs or organizations: Not on file    Relationship status: Not on file  Other Topics Concern  . Not on file  Social History Narrative  . Not  on file    Family History  Problem Relation Age of Onset  . Coronary artery disease Sister        x 2 in 29's  . Coronary artery disease Brother        x 2 in 70's  . Diabetes Unknown   . Colon cancer Father 30  . Hypertension Unknown   . Stroke Mother 57  . Crohn's disease Brother   . Breast cancer Unknown        Aunt  . Cirrhosis Brother   . Liver disease Son     Review of Systems  Constitutional: Negative for appetite change, chills and fever.  Respiratory: Negative for cough, shortness of breath and wheezing.   Cardiovascular: Negative for chest pain, palpitations and leg swelling.  Gastrointestinal: Negative for abdominal pain.  Neurological: Negative for light-headedness and headaches.  Psychiatric/Behavioral: Positive for dysphoric mood and sleep disturbance. The patient is nervous/anxious.        Objective:   Vitals:   08/30/17 0958  BP: 138/88  Pulse: 67  Resp: 16  Temp: 98.1 F (36.7 C)  SpO2: 98%   BP Readings from Last 3 Encounters:  08/30/17 138/88  06/27/17 130/88  03/20/17 (!) 147/76   Wt Readings from Last 3 Encounters:  08/30/17 164 lb (74.4 kg)  06/27/17 163 lb (73.9 kg)  03/20/17 166 lb (75.3 kg)   Body mass index is 26.47 kg/m.   Physical Exam    Constitutional: Appears well-developed and well-nourished. No distress.  HENT:  Head: Normocephalic and atraumatic.  Neck: Neck supple. No tracheal deviation present. No thyromegaly present.  No cervical lymphadenopathy Cardiovascular: Normal rate, regular rhythm and normal heart sounds.   No murmur heard. No carotid bruit .  No  edema Pulmonary/Chest: Effort normal and breath sounds normal. No respiratory distress. No has no wheezes. No rales.  Skin: Skin is warm and dry. Not diaphoretic.  Psychiatric: Normal mood and affect. Behavior is normal.       Assessment & Plan:    See Problem List for Assessment and Plan of chronic medical problems.

## 2017-08-30 ENCOUNTER — Ambulatory Visit (INDEPENDENT_AMBULATORY_CARE_PROVIDER_SITE_OTHER): Payer: Medicare Other | Admitting: Internal Medicine

## 2017-08-30 ENCOUNTER — Encounter

## 2017-08-30 ENCOUNTER — Encounter: Payer: Self-pay | Admitting: Internal Medicine

## 2017-08-30 VITALS — BP 138/88 | HR 67 | Temp 98.1°F | Resp 16 | Wt 164.0 lb

## 2017-08-30 DIAGNOSIS — G4700 Insomnia, unspecified: Secondary | ICD-10-CM | POA: Diagnosis not present

## 2017-08-30 DIAGNOSIS — F4323 Adjustment disorder with mixed anxiety and depressed mood: Secondary | ICD-10-CM | POA: Insufficient documentation

## 2017-08-30 DIAGNOSIS — I10 Essential (primary) hypertension: Secondary | ICD-10-CM | POA: Diagnosis not present

## 2017-08-30 MED ORDER — MIRTAZAPINE 7.5 MG PO TABS: 7.5000 mg | ORAL_TABLET | Freq: Every day | ORAL | 5 refills | Status: DC

## 2017-08-30 NOTE — Patient Instructions (Addendum)
   Medications reviewed and updated.  Changes include stopping the sertraline.  Start remeron at bedtime.     Your prescription(s) have been submitted to your pharmacy. Please take as directed and contact our office if you believe you are having problem(s) with the medication(s).    Please followup in September as scheduled - sooner if needed.  Call if needed with update if medications are not working.

## 2017-08-31 ENCOUNTER — Telehealth: Payer: Self-pay | Admitting: Internal Medicine

## 2017-08-31 MED ORDER — SERTRALINE HCL 100 MG PO TABS: 100.0000 mg | ORAL_TABLET | Freq: Every day | ORAL | 3 refills | Status: DC

## 2017-08-31 MED ORDER — LORAZEPAM 2 MG PO TABS: ORAL_TABLET | ORAL | 0 refills | Status: DC

## 2017-08-31 NOTE — Telephone Encounter (Signed)
Copied from [place] [phone]. Topic: Quick Communication - See Telephone Encounter >> Aug 31, 2017 10:58 AM Neva Seat wrote: Pt is letting Dr Quay Burow know that the new medicine is not working for her.   Pt has been up all night, tongue is raw, dizzy.

## 2017-08-31 NOTE — Assessment & Plan Note (Signed)
BP well controlled Current regimen effective and well tolerated Continue current medications at current doses  

## 2017-08-31 NOTE — Assessment & Plan Note (Signed)
Has not slept since moving out of her house - adjustment disorder Discussed sleep hygeine Likely related to depression, anxiety Trial of remeron 7.5 mg at night - can titrate it tolerated If not effective will try increasing sertralin to 100 mg daily discussed seeing a therapist

## 2017-08-31 NOTE — Assessment & Plan Note (Signed)
adjustment disorder related to moving - having insomnia, depression and anxiety Discussed sleep hygeine Depression, anxiety improved with sertraline but still not controlled D/c sertraline Trial of remeron 7.5 mg at night - can titrate it tolerated If not effective will try increasing sertralin to 100 mg daily discussed seeing a therapist

## 2017-08-31 NOTE — Telephone Encounter (Signed)
Stop remeron.  Restart sertraline and increase to 100 mg daily.

## 2017-08-31 NOTE — Telephone Encounter (Signed)
Spoke with pt to inform.  

## 2017-10-04 ENCOUNTER — Other Ambulatory Visit: Payer: Self-pay | Admitting: Emergency Medicine

## 2017-10-04 MED ORDER — SERTRALINE HCL 100 MG PO TABS: 100.0000 mg | ORAL_TABLET | Freq: Every day | ORAL | 1 refills | Status: DC

## 2017-12-06 ENCOUNTER — Other Ambulatory Visit: Payer: Self-pay

## 2017-12-06 MED ORDER — PANTOPRAZOLE SODIUM 40 MG PO TBEC: 40.0000 mg | DELAYED_RELEASE_TABLET | Freq: Every day | ORAL | 1 refills | Status: DC

## 2017-12-24 NOTE — Progress Notes (Signed)
Subjective:    Patient ID: [name], female    DOB: 07/06/1930, 84 y.o.   MRN: [id number]  HPI The patient is here for follow up.  Depression: She is taking her medication daily as prescribed. She denies any side effects from the medication. She feels her depression is well controlled and she is happy with her current dose of medication.   Hypertension: She is taking her medication daily. She is compliant with a low sodium diet.  She denies chest pain, palpitations, edema, shortness of breath and regular headaches. She is exercising  - walking.  She does not monitor her blood pressure at home.    Anxiety: She is taking her medication daily as prescribed. She denies any side effects from the medication. She feels her anxiety is well controlled and she is happy with her current dose of medication.   Insomnia:  She is sleeping well.  She is taking sertraline nightly.  She is happy and feels well adjusted to her living situation at [facility].    Prediabetes:  She is compliant with a low sugar/carbohydrate diet.  She is exercising regularly.  GERD:  She is taking her medication daily as prescribed.  She denies any GERD symptoms and feels her GERD is well controlled.   Cough: She has an intermittent cough and has had it for a few months.  She can cough for a couple of minutes and then not have a cough for a long time.  The cough is dry.  She denies shortness of breath, wheezing, fevers.  She denies other cold symptoms.  She does not feel sick.  Medications and allergies reviewed with patient and updated if appropriate.  Patient Active Problem List   Diagnosis Date Noted  . Insomnia 08/30/2017  . Adjustment disorder with mixed anxiety and depressed mood 08/30/2017  . Prediabetes 09/18/2016  . Grief 04/20/2016  . Thyroid nodule 01/19/2016  . Multiple falls 01/20/2015  . Hx MRSA infection 03/05/2013  . Lap Repair Large Hiatal Hernia with Nissen May 2014 09/20/2012  . Cough  09/13/2012  . Large type III mixed hiatus hernia 08/16/2012  . Carotid arterial disease ([place]) 10/31/2011  . OTH SPEC ALVEOL&PARIETOALVEOL PNEUMONOPATHIES 04/28/2010  . HEMORRHOIDS-INTERNAL 03/01/2010  . GASTRIC OUTLET OBSTRUCTION 02/10/2010  . RHEUMATIC HEART DISEASE 02/02/2010  . ESOPHAGEAL STRICTURE 02/02/2010  . GERD 02/02/2010  . DUODENAL ULCER 02/02/2010  . HIATAL HERNIA 02/02/2010  . DIVERTICULOSIS, COLON 02/02/2010  . OSTEOARTHRITIS 02/02/2010  . DECREASED HEARING, BILATERAL 02/06/2008  . PEPTIC ULCER DISEASE 06/07/2007  . Hyperlipidemia 12/19/2006  . Anxiety and depression 12/19/2006  . COPD 12/19/2006  . GLAUCOMA 09/04/2006  . HTN (hypertension) 09/04/2006  . Osteoporosis 09/04/2006  . PREMATURE VENTRICULAR CONTRACTIONS 10/06/1994  . MASTECTOMY, RIGHT, HX OF 04/18/1993    Current Outpatient Medications on File Prior to Visit  Medication Sig Dispense Refill  . aspirin 81 MG tablet Take 81 mg by mouth daily.      [name] azelastine (ASTELIN) 137 MCG/SPRAY nasal spray Place 1 spray into the nose 2 (two) times daily as needed (for allergies). Reported on 07/20/2015    . Biotin (BIOTIN ULTRA STRENGTH) 10 MG CAPS Take 1 capsule by mouth daily.    [name] LORazepam (ATIVAN) 2 MG tablet TAKE 1/2 TABLET BY MOUTH EVERY 8 HOURS AS NEEDED FOR ANXIETY 60 tablet 0  . pantoprazole (PROTONIX) 40 MG tablet Take 1 tablet (40 mg total) by mouth daily. 90 tablet 1  . polyethylene glycol (MIRALAX /  GLYCOLAX) packet Take 17 g by mouth daily.    . sertraline (ZOLOFT) 100 MG tablet Take 1 tablet (100 mg total) by mouth daily. 90 tablet 1  . telmisartan-hydrochlorothiazide (MICARDIS HCT) 40-12.5 MG tablet Take 1 tablet by mouth daily. 90 tablet 3  . vitamin B-12 (CYANOCOBALAMIN) 1000 MCG tablet Take 1,000 mcg by mouth daily.       No current facility-administered medications on file prior to visit.     Past Medical History:  Diagnosis Date  . Anxiety   . Breast cancer ([place]) 13 years ago   s/p tamoxifen    . COPD (chronic obstructive pulmonary disease) ([place])   . Depression   . Diverticulosis   . Esophageal stricture   . Gastric outlet obstruction   . GERD (gastroesophageal reflux disease)   . Glaucoma   . Hiatal hernia   . Hiatal hernia   . Hyperlipidemia   . Hypertension   . Insomnia   . Internal hemorrhoids   . Osteoarthritis   . Osteoporosis   . Peptic ulcer disease   . Rheumatic heart disease   . Ulcerative colitis     Past Surgical History:  Procedure Laterality Date  . ABDOMINAL HYSTERECTOMY    . BREAST RECONSTRUCTION  2000   right  . BREAST SURGERY     right  . CATARACT EXTRACTION  08/2006 and 10/2006  . HEMORRHOID SURGERY    . HIATAL HERNIA REPAIR N/A 09/03/2012   Procedure: LAPAROSCOPIC REPAIR OF HIATAL HERNIA;  Surgeon: Pedro Earls, MD;  Location: [facility];  Service: General;  Laterality: N/A;  . JOINT REPLACEMENT     right  . LAPAROSCOPIC NISSEN FUNDOPLICATION N/A 5/62/1308   Procedure: LAP REPAIR OF LARGE TYPE III MIXED HIATUS HERNIA WITH NISSEN FUNOPLICATION;  Surgeon: Pedro Earls, MD;  Location: [facility];  Service: General;  Laterality: N/A;  . [place]   right  . TONSILLECTOMY    . TOTAL KNEE ARTHROPLASTY     left    Social History   Socioeconomic History  . Marital status: Widowed    Spouse name: Not on file  . Number of children: 3  . Years of education: Not on file  . Highest education level: Not on file  Occupational History  . Occupation: retired-sales    Employer: RETIRED  Social Needs  . Financial resource strain: Not hard at all  . Food insecurity:    Worry: Never true    Inability: Never true  . Transportation needs:    Medical: No    Non-medical: No  Tobacco Use  . Smoking status: Never Smoker  . Smokeless tobacco: Never Used  Substance and Sexual Activity  . Alcohol use: No    Alcohol/week: 0.0 standard drinks  . Drug use: No  . Sexual activity: Not on file  Lifestyle  . Physical activity:    Days per week: 0  days    Minutes per session: 0 min  . Stress: Not on file  Relationships  . Social connections:    Talks on phone: Not on file    Gets together: Not on file    Attends religious service: Not on file    Active member of club or organization: Not on file    Attends meetings of clubs or organizations: Not on file    Relationship status: Not on file  Other Topics Concern  . Not on file  Social History Narrative  . Not on file    Family  History  Problem Relation Age of Onset  . Coronary artery disease Sister        x 2 in 37's  . Coronary artery disease Brother        x 2 in 70's  . Diabetes Unknown   . Colon cancer Father 49  . Hypertension Unknown   . Stroke Mother 59  . Crohn's disease Brother   . Breast cancer Unknown        Aunt  . Cirrhosis Brother   . Liver disease Son     Review of Systems  Constitutional: Negative for chills and fever.  HENT: Negative for postnasal drip.   Respiratory: Positive for cough (dry). Negative for shortness of breath and wheezing.   Cardiovascular: Negative for chest pain, palpitations and leg swelling.  Neurological: Positive for headaches (occasional). Negative for light-headedness.       Objective:   Vitals:   12/26/17 1107  BP: 138/86  Pulse: 79  Resp: 16  Temp: 98.4 F (36.9 C)  SpO2: 95%   BP Readings from Last 3 Encounters:  12/26/17 138/86  08/30/17 138/88  06/27/17 130/88   Wt Readings from Last 3 Encounters:  12/26/17 162 lb (73.5 kg)  08/30/17 164 lb (74.4 kg)  06/27/17 163 lb (73.9 kg)   Body mass index is 26.15 kg/m.   Physical Exam    Constitutional: Appears well-developed and well-nourished. No distress.  HENT:  Head: Normocephalic and atraumatic.  Neck: Neck supple. No tracheal deviation present. No thyromegaly present.  No cervical lymphadenopathy Cardiovascular: Normal rate, regular rhythm and normal heart sounds.   No murmur heard. No carotid bruit .  No edema Pulmonary/Chest: Occasional cough  that sounds bronchial.  Effort normal and breath sounds normal. No respiratory distress. No has no wheezes. No rales.  Skin: Skin is warm and dry. Not diaphoretic.  Psychiatric: Normal mood and affect. Behavior is normal.      Assessment & Plan:    See Problem List for Assessment and Plan of chronic medical problems.

## 2017-12-24 NOTE — Patient Instructions (Addendum)
Have blood work and a chest xray today.   Test(s) ordered today. Your results will be released to Shirley (or called to you) after review, usually within 72hours after test completion. If any changes need to be made, you will be notified at that same time.  Medications reviewed and updated.  No changes recommended at this time.  Your prescription(s) have been submitted to your pharmacy. Please take as directed and contact our office if you believe you are having problem(s) with the medication(s).    Please followup in 6 months

## 2017-12-26 ENCOUNTER — Ambulatory Visit (INDEPENDENT_AMBULATORY_CARE_PROVIDER_SITE_OTHER): Payer: Medicare Other | Admitting: Internal Medicine

## 2017-12-26 ENCOUNTER — Other Ambulatory Visit (INDEPENDENT_AMBULATORY_CARE_PROVIDER_SITE_OTHER): Payer: Medicare Other

## 2017-12-26 ENCOUNTER — Ambulatory Visit (INDEPENDENT_AMBULATORY_CARE_PROVIDER_SITE_OTHER)
Admission: RE | Admit: 2017-12-26 | Discharge: 2017-12-26 | Disposition: A | Discharge status: Home / Self Care | Payer: Medicare Other | Source: Ambulatory Visit | Attending: Internal Medicine | Admitting: Internal Medicine

## 2017-12-26 ENCOUNTER — Encounter: Payer: Self-pay | Admitting: Internal Medicine

## 2017-12-26 VITALS — BP 138/86 | HR 79 | Temp 98.4°F | Resp 16 | Ht 66.0 in | Wt 162.0 lb

## 2017-12-26 DIAGNOSIS — F419 Anxiety disorder, unspecified: Secondary | ICD-10-CM | POA: Diagnosis not present

## 2017-12-26 DIAGNOSIS — R059 Cough, unspecified: Secondary | ICD-10-CM

## 2017-12-26 DIAGNOSIS — K219 Gastro-esophageal reflux disease without esophagitis: Secondary | ICD-10-CM

## 2017-12-26 DIAGNOSIS — R7303 Prediabetes: Secondary | ICD-10-CM

## 2017-12-26 DIAGNOSIS — R05 Cough: Secondary | ICD-10-CM

## 2017-12-26 DIAGNOSIS — I10 Essential (primary) hypertension: Secondary | ICD-10-CM

## 2017-12-26 DIAGNOSIS — R079 Chest pain, unspecified: Secondary | ICD-10-CM | POA: Diagnosis not present

## 2017-12-26 DIAGNOSIS — E782 Mixed hyperlipidemia: Secondary | ICD-10-CM

## 2017-12-26 DIAGNOSIS — G4700 Insomnia, unspecified: Secondary | ICD-10-CM | POA: Diagnosis not present

## 2017-12-26 DIAGNOSIS — F329 Major depressive disorder, single episode, unspecified: Secondary | ICD-10-CM

## 2017-12-26 LAB — LIPID PANEL
CHOL/HDL RATIO: 4
Cholesterol: 201 mg/dL — ABNORMAL HIGH (ref 0–200)
HDL: 49 mg/dL (ref >39.00)
LDL Cholesterol: 121 mg/dL — ABNORMAL HIGH (ref 0–99)
NonHDL: 152.02
Triglycerides: 155 mg/dL — ABNORMAL HIGH (ref 0.0–149.0)
VLDL: 31 mg/dL (ref 0.0–40.0)

## 2017-12-26 LAB — CBC WITH DIFFERENTIAL/PLATELET
BASOS ABS: 0 10*3/uL (ref 0.0–0.1)
Basophils Relative: 0.3 % (ref 0.0–3.0)
EOS ABS: 0 10*3/uL (ref 0.0–0.7)
Eosinophils Relative: 0.6 % (ref 0.0–5.0)
HEMATOCRIT: 39.4 % (ref 36.0–46.0)
Hemoglobin: 13.2 g/dL (ref 12.0–15.0)
LYMPHS ABS: 1.4 10*3/uL (ref 0.7–4.0)
LYMPHS PCT: 21.1 % (ref 12.0–46.0)
MCHC: 33.6 g/dL (ref 30.0–36.0)
MCV: 94.3 fl (ref 78.0–100.0)
MONOS PCT: 4.4 % (ref 3.0–12.0)
Monocytes Absolute: 0.3 10*3/uL (ref 0.1–1.0)
NEUTROS PCT: 73.6 % (ref 43.0–77.0)
Neutro Abs: 4.8 10*3/uL (ref 1.4–7.7)
Platelets: 200 10*3/uL (ref 150.0–400.0)
RBC: 4.17 Mil/uL (ref 3.87–5.11)
RDW: 13.2 % (ref 11.5–15.5)
WBC: 6.6 10*3/uL (ref 4.0–10.5)

## 2017-12-26 LAB — COMPREHENSIVE METABOLIC PANEL
ALK PHOS: 86 U/L (ref 39–117)
ALT: 9 U/L (ref 0–35)
AST: 16 U/L (ref 0–37)
Albumin: 4.3 g/dL (ref 3.5–5.2)
BILIRUBIN TOTAL: 0.7 mg/dL (ref 0.2–1.2)
BUN: 18 mg/dL (ref 6–23)
CALCIUM: 9.8 mg/dL (ref 8.4–10.5)
CO2: 29 mEq/L (ref 19–32)
Chloride: 102 mEq/L (ref 96–112)
Creatinine, Ser: 0.83 mg/dL (ref 0.40–1.20)
GFR: 69.45 mL/min (ref >60.00)
Glucose, Bld: 107 mg/dL — ABNORMAL HIGH (ref 70–99)
Potassium: 3.7 mEq/L (ref 3.5–5.1)
SODIUM: 139 meq/L (ref 135–145)
TOTAL PROTEIN: 7.7 g/dL (ref 6.0–8.3)

## 2017-12-26 LAB — HEMOGLOBIN A1C: Hgb A1c MFr Bld: 6.1 % (ref 4.6–6.5)

## 2017-12-26 MED ORDER — SERTRALINE HCL 100 MG PO TABS: 100.0000 mg | ORAL_TABLET | Freq: Every day | ORAL | 1 refills | Status: DC

## 2017-12-26 MED ORDER — TELMISARTAN-HCTZ 40-12.5 MG PO TABS: 1.0000 | ORAL_TABLET | Freq: Every day | ORAL | 3 refills | Status: DC

## 2017-12-26 MED ORDER — LORAZEPAM 2 MG PO TABS: ORAL_TABLET | ORAL | 0 refills | Status: DC

## 2017-12-26 NOTE — Assessment & Plan Note (Signed)
Not on any medication Check lipid panel, CMP

## 2017-12-26 NOTE — Assessment & Plan Note (Signed)
Check a1c Low sugar / carb diet Stressed regular exercise   

## 2017-12-26 NOTE — Assessment & Plan Note (Signed)
BP well controlled Current regimen effective and well tolerated Continue current medications at current doses cmp  

## 2017-12-26 NOTE — Assessment & Plan Note (Signed)
GERD controlled Continue daily medication  

## 2017-12-26 NOTE — Assessment & Plan Note (Signed)
Cough is dry, but has been persistent for a few months No concerning symptoms and overall feels okay Will check chest x-ray She does have a history of COPD and may benefit from [facility] for an inhaler-will see what results of chest x-ray are

## 2017-12-26 NOTE — Assessment & Plan Note (Addendum)
Taking sertraline nightly, lorazepam Sleep is good She has adjusted to her life changes Continue current medication

## 2017-12-26 NOTE — Assessment & Plan Note (Signed)
Both anxiety and depression are well controlled Continue current dose of sertraline Continue lorazepam as needed She has adjusted well to her new living situation and some of the losses she has had in her life Follow-up in 6 months

## 2017-12-28 ENCOUNTER — Other Ambulatory Visit: Payer: Self-pay | Admitting: Internal Medicine

## 2017-12-28 MED ORDER — UMECLIDINIUM-VILANTEROL 62.5-25 MCG/INH IN AEPB: 1.0000 | INHALATION_SPRAY | Freq: Every day | RESPIRATORY_TRACT | 5 refills | Status: DC

## 2018-01-14 NOTE — Progress Notes (Signed)
Subjective:    Patient ID: [name], female    DOB: 1930/12/23, 84 y.o.   MRN: [id number]  HPI The patient is here for an acute visit.   Shoulder pain, ? Arthritis:  She has been having left shoulder pain for a littl while.  She denies any injury.  She has been taking Tylenol arthritis every hours for a few days and it has helped.  She also applies Aspercreme.  She has decreased ROM.  She denies numbness/tingling in the left arm.  She denies left hand weakness.     Leg swelling:  It is increased.  She denies increased salt intake.  She has not been walking as much.  The swelling is mild.  Dry cough: She has been taking the Anoro inhaler daily and states that has improved her cough and she feels her breathing is better.  She has been experiencing chapped lips since starting to use it.  She was wondering if that was related to the inhaler or not.  Medications and allergies reviewed with patient and updated if appropriate.  Patient Active Problem List   Diagnosis Date Noted  . Insomnia 08/30/2017  . Adjustment disorder with mixed anxiety and depressed mood 08/30/2017  . Prediabetes 09/18/2016  . Grief 04/20/2016  . Thyroid nodule 01/19/2016  . Multiple falls 01/20/2015  . Hx MRSA infection 03/05/2013  . Lap Repair Large Hiatal Hernia with Nissen May 2014 09/20/2012  . Cough 09/13/2012  . Large type III mixed hiatus hernia 08/16/2012  . Carotid arterial disease ([place]) 10/31/2011  . OTH SPEC ALVEOL&PARIETOALVEOL PNEUMONOPATHIES 04/28/2010  . HEMORRHOIDS-INTERNAL 03/01/2010  . GASTRIC OUTLET OBSTRUCTION 02/10/2010  . RHEUMATIC HEART DISEASE 02/02/2010  . ESOPHAGEAL STRICTURE 02/02/2010  . GERD 02/02/2010  . DUODENAL ULCER 02/02/2010  . HIATAL HERNIA 02/02/2010  . DIVERTICULOSIS, COLON 02/02/2010  . OSTEOARTHRITIS 02/02/2010  . DECREASED HEARING, BILATERAL 02/06/2008  . PEPTIC ULCER DISEASE 06/07/2007  . Hyperlipidemia 12/19/2006  . Anxiety and depression 12/19/2006  .  COPD 12/19/2006  . GLAUCOMA 09/04/2006  . HTN (hypertension) 09/04/2006  . Osteoporosis 09/04/2006  . PREMATURE VENTRICULAR CONTRACTIONS 10/06/1994  . MASTECTOMY, RIGHT, HX OF 04/18/1993    Current Outpatient Medications on File Prior to Visit  Medication Sig Dispense Refill  . aspirin 81 MG tablet Take 81 mg by mouth daily.      [name] azelastine (ASTELIN) 137 MCG/SPRAY nasal spray Place 1 spray into the nose 2 (two) times daily as needed (for allergies). Reported on 07/20/2015    . Biotin (BIOTIN ULTRA STRENGTH) 10 MG CAPS Take 1 capsule by mouth daily.    [name] LORazepam (ATIVAN) 2 MG tablet TAKE 1/2 TABLET BY MOUTH EVERY 8 HOURS AS NEEDED FOR ANXIETY 60 tablet 0  . pantoprazole (PROTONIX) 40 MG tablet Take 1 tablet (40 mg total) by mouth daily. 90 tablet 1  . polyethylene glycol (MIRALAX / GLYCOLAX) packet Take 17 g by mouth daily.    . sertraline (ZOLOFT) 100 MG tablet Take 1 tablet (100 mg total) by mouth daily. 90 tablet 1  . telmisartan-hydrochlorothiazide (MICARDIS HCT) 40-12.5 MG tablet Take 1 tablet by mouth daily. 90 tablet 3  . umeclidinium-vilanterol (ANORO ELLIPTA) 62.5-25 MCG/INH AEPB Inhale 1 puff into the lungs daily. 60 each 5  . vitamin B-12 (CYANOCOBALAMIN) 1000 MCG tablet Take 1,000 mcg by mouth daily.       No current facility-administered medications on file prior to visit.     Past Medical History:  Diagnosis Date  .  Anxiety   . Breast cancer ([place]) 13 years ago   s/p tamoxifen   . COPD (chronic obstructive pulmonary disease) ([place])   . Depression   . Diverticulosis   . Esophageal stricture   . Gastric outlet obstruction   . GERD (gastroesophageal reflux disease)   . Glaucoma   . Hiatal hernia   . Hiatal hernia   . Hyperlipidemia   . Hypertension   . Insomnia   . Internal hemorrhoids   . Osteoarthritis   . Osteoporosis   . Peptic ulcer disease   . Rheumatic heart disease   . Ulcerative colitis     Past Surgical History:  Procedure Laterality Date  .  ABDOMINAL HYSTERECTOMY    . BREAST RECONSTRUCTION  2000   right  . BREAST SURGERY     right  . CATARACT EXTRACTION  08/2006 and 10/2006  . HEMORRHOID SURGERY    . HIATAL HERNIA REPAIR N/A 09/03/2012   Procedure: LAPAROSCOPIC REPAIR OF HIATAL HERNIA;  Surgeon: Pedro Earls, MD;  Location: [facility];  Service: General;  Laterality: N/A;  . JOINT REPLACEMENT     right  . LAPAROSCOPIC NISSEN FUNDOPLICATION N/A 6/57/8469   Procedure: LAP REPAIR OF LARGE TYPE III MIXED HIATUS HERNIA WITH NISSEN FUNOPLICATION;  Surgeon: Pedro Earls, MD;  Location: [facility];  Service: General;  Laterality: N/A;  . [place]   right  . TONSILLECTOMY    . TOTAL KNEE ARTHROPLASTY     left    Social History   Socioeconomic History  . Marital status: Widowed    Spouse name: Not on file  . Number of children: 3  . Years of education: Not on file  . Highest education level: Not on file  Occupational History  . Occupation: retired-sales    Employer: RETIRED  Social Needs  . Financial resource strain: Not hard at all  . Food insecurity:    Worry: Never true    Inability: Never true  . Transportation needs:    Medical: No    Non-medical: No  Tobacco Use  . Smoking status: Never Smoker  . Smokeless tobacco: Never Used  Substance and Sexual Activity  . Alcohol use: No    Alcohol/week: 0.0 standard drinks  . Drug use: No  . Sexual activity: Not on file  Lifestyle  . Physical activity:    Days per week: 0 days    Minutes per session: 0 min  . Stress: Not on file  Relationships  . Social connections:    Talks on phone: Not on file    Gets together: Not on file    Attends religious service: Not on file    Active member of club or organization: Not on file    Attends meetings of clubs or organizations: Not on file    Relationship status: Not on file  Other Topics Concern  . Not on file  Social History Narrative  . Not on file    Family History  Problem Relation Age of Onset  .  Coronary artery disease Sister        x 2 in 91's  . Coronary artery disease Brother        x 2 in 70's  . Diabetes Unknown   . Colon cancer Father 38  . Hypertension Unknown   . Stroke Mother 65  . Crohn's disease Brother   . Breast cancer Unknown        Aunt  . Cirrhosis Brother   .  Liver disease Son     Review of Systems  Cardiovascular: Positive for leg swelling (mild).  Musculoskeletal: Negative for joint swelling, neck pain and neck stiffness.  Neurological: Negative for weakness and numbness.       Objective:   Vitals:   01/16/18 1326  BP: 136/84  Pulse: 75  Resp: 16  Temp: 98.6 F (37 C)  SpO2: 97%   BP Readings from Last 3 Encounters:  01/16/18 136/84  12/26/17 138/86  08/30/17 138/88   Wt Readings from Last 3 Encounters:  01/16/18 165 lb (74.8 kg)  12/26/17 162 lb (73.5 kg)  08/30/17 164 lb (74.4 kg)   Body mass index is 26.63 kg/m.   Physical Exam    A Left Shoulder exam was performed.   SWELLING: none  EFFUSION: no  WARMTH: no warmth  TENDERNESS: no tenderness on throughout shoulder joint  ROM: dec ROM with pain, can only elevate arm 90 degrees laterally and unable to externally rotate w/o pain  NEUROLOGICAL EXAM: normal sensation and strength  PULSES: normal  LEGS: trace edema b/l LE       Assessment & Plan:    See Problem List for Assessment and Plan of chronic medical problems.

## 2018-01-16 ENCOUNTER — Encounter: Payer: Self-pay | Admitting: Internal Medicine

## 2018-01-16 ENCOUNTER — Ambulatory Visit (INDEPENDENT_AMBULATORY_CARE_PROVIDER_SITE_OTHER)
Admission: RE | Admit: 2018-01-16 | Discharge: 2018-01-16 | Disposition: A | Discharge status: Home / Self Care | Payer: Medicare Other | Source: Ambulatory Visit | Attending: Internal Medicine | Admitting: Internal Medicine

## 2018-01-16 ENCOUNTER — Ambulatory Visit (INDEPENDENT_AMBULATORY_CARE_PROVIDER_SITE_OTHER): Payer: Medicare Other | Admitting: Internal Medicine

## 2018-01-16 VITALS — BP 136/84 | HR 75 | Temp 98.6°F | Resp 16 | Ht 66.0 in | Wt 165.0 lb

## 2018-01-16 DIAGNOSIS — M25512 Pain in left shoulder: Secondary | ICD-10-CM

## 2018-01-16 DIAGNOSIS — R05 Cough: Secondary | ICD-10-CM

## 2018-01-16 DIAGNOSIS — R059 Cough, unspecified: Secondary | ICD-10-CM

## 2018-01-16 MED ORDER — FLUTICASONE-SALMETEROL 100-50 MCG/DOSE IN AEPB: 1.0000 | INHALATION_SPRAY | Freq: Two times a day (BID) | RESPIRATORY_TRACT | 5 refills | Status: DC

## 2018-01-16 NOTE — Patient Instructions (Addendum)
  Have an x-ray of your shoulder today.  We will call you with the results.     Medications reviewed and updated.  Changes include :   Try Advair for your cough.  Continue the tylenol three times a day and Aspercreme for your shoulder pain.

## 2018-01-16 NOTE — Assessment & Plan Note (Signed)
No injury or accident Pain and decreased ROM Will get an xray Possible rotator cuff tendinitis Will refer to Dr Tamala Julian for further evaluation Continue Tylenol Q 8 hr and aspercreme

## 2018-01-16 NOTE — Assessment & Plan Note (Signed)
Dry, persistent cough Improved with Anoro inhaler daily, but is getting Keflex Discontinue Anoro Trial of Advair 100/50

## 2018-01-30 NOTE — Progress Notes (Signed)
[name], [place] [address] Phone: [phone] Subjective:    I [name] am serving as a Education administrator for Dr. Hulan Saas.   CC: Left shoulder pain  [id number]  [name] is a 84 y.o. female coming in with complaint of left shoulder pain. States that the heaviest thing she lifts is trash. Sleeps on the left side. Limited ROM. No numbness and tingling or neck pain.   Onset- 3 weeks  Location- Joint pain Aggravating factors- Abduction, flexion, extension Therapies tried- Ice, heat, topical, oral Severity-5 out of 10     Past Medical History:  Diagnosis Date  . Anxiety   . Breast cancer ([place]) 13 years ago   s/p tamoxifen   . COPD (chronic obstructive pulmonary disease) ([place])   . Depression   . Diverticulosis   . Esophageal stricture   . Gastric outlet obstruction   . GERD (gastroesophageal reflux disease)   . Glaucoma   . Hiatal hernia   . Hiatal hernia   . Hyperlipidemia   . Hypertension   . Insomnia   . Internal hemorrhoids   . Osteoarthritis   . Osteoporosis   . Peptic ulcer disease   . Rheumatic heart disease   . Ulcerative colitis    Past Surgical History:  Procedure Laterality Date  . ABDOMINAL HYSTERECTOMY    . BREAST RECONSTRUCTION  2000   right  . BREAST SURGERY     right  . CATARACT EXTRACTION  08/2006 and 10/2006  . HEMORRHOID SURGERY    . HIATAL HERNIA REPAIR N/A 09/03/2012   Procedure: LAPAROSCOPIC REPAIR OF HIATAL HERNIA;  Surgeon: Pedro Earls, MD;  Location: [facility];  Service: General;  Laterality: N/A;  . JOINT REPLACEMENT     right  . LAPAROSCOPIC NISSEN FUNDOPLICATION N/A 09/02/6158   Procedure: LAP REPAIR OF LARGE TYPE III MIXED HIATUS HERNIA WITH NISSEN FUNOPLICATION;  Surgeon: Pedro Earls, MD;  Location: [facility];  Service: General;  Laterality: N/A;  . [place]   right  . TONSILLECTOMY    . TOTAL KNEE ARTHROPLASTY     left   Social History   Socioeconomic  History  . Marital status: Widowed    Spouse name: Not on file  . Number of children: 3  . Years of education: Not on file  . Highest education level: Not on file  Occupational History  . Occupation: retired-sales    Employer: RETIRED  Social Needs  . Financial resource strain: Not hard at all  . Food insecurity:    Worry: Never true    Inability: Never true  . Transportation needs:    Medical: No    Non-medical: No  Tobacco Use  . Smoking status: Never Smoker  . Smokeless tobacco: Never Used  Substance and Sexual Activity  . Alcohol use: No    Alcohol/week: 0.0 standard drinks  . Drug use: No  . Sexual activity: Not on file  Lifestyle  . Physical activity:    Days per week: 0 days    Minutes per session: 0 min  . Stress: Not on file  Relationships  . Social connections:    Talks on phone: Not on file    Gets together: Not on file    Attends religious service: Not on file    Active member of club or organization: Not on file    Attends meetings of clubs or organizations: Not on file    Relationship  status: Not on file  Other Topics Concern  . Not on file  Social History Narrative  . Not on file   Allergies  Allergen Reactions  . Demerol [Meperidine] Swelling    Throat swelling   . Meperidine Hcl Swelling    REACTION: THROAT SWELLING  . Morphine     REACTION: SWELLING  . Nitroglycerin     Very low bp   Family History  Problem Relation Age of Onset  . Coronary artery disease Sister        x 2 in 77's  . Coronary artery disease Brother        x 2 in 70's  . Diabetes Unknown   . Colon cancer Father 50  . Hypertension Unknown   . Stroke Mother 34  . Crohn's disease Brother   . Breast cancer Unknown        Aunt  . Cirrhosis Brother   . Liver disease Son      Current Outpatient Medications (Cardiovascular):  .  telmisartan-hydrochlorothiazide (MICARDIS HCT) 40-12.5 MG tablet, Take 1 tablet by mouth daily.  Current Outpatient Medications  (Respiratory):  .  azelastine (ASTELIN) 137 MCG/SPRAY nasal spray, Place 1 spray into the nose 2 (two) times daily as needed (for allergies). Reported on 07/20/2015 .  Fluticasone-Salmeterol (ADVAIR DISKUS) 100-50 MCG/DOSE AEPB, Inhale 1 puff into the lungs 2 (two) times daily. [name]  umeclidinium-vilanterol (ANORO ELLIPTA) 62.5-25 MCG/INH AEPB, Inhale 1 puff into the lungs daily.  Current Outpatient Medications (Analgesics):  .  aspirin 81 MG tablet, Take 81 mg by mouth daily.    Current Outpatient Medications (Hematological):  .  vitamin B-12 (CYANOCOBALAMIN) 1000 MCG tablet, Take 1,000 mcg by mouth daily.    Current Outpatient Medications (Other):  .  Biotin (BIOTIN ULTRA STRENGTH) 10 MG CAPS, Take 1 capsule by mouth daily. [name]  LORazepam (ATIVAN) 2 MG tablet, TAKE 1/2 TABLET BY MOUTH EVERY 8 HOURS AS NEEDED FOR ANXIETY .  pantoprazole (PROTONIX) 40 MG tablet, Take 1 tablet (40 mg total) by mouth daily. .  polyethylene glycol (MIRALAX / GLYCOLAX) packet, Take 17 g by mouth daily. .  sertraline (ZOLOFT) 100 MG tablet, Take 1 tablet (100 mg total) by mouth daily.    Past medical history, social, surgical and family history all reviewed in electronic medical record.  No pertanent information unless stated regarding to the chief complaint.   Review of Systems:  No headache, visual changes, nausea, vomiting, diarrhea, constipation, dizziness, abdominal pain, skin rash, fevers, chills, night sweats, weight loss, swollen lymph nodes, body aches, joint swelling, , chest pain, shortness of breath, mood changes.    Objective  Blood pressure 140/82, pulse 68, height 5\' 6"  (1.676 m), weight 165 lb (74.8 kg), SpO2 98 %.   General: No apparent distress alert and oriented x3 mood and affect normal, dressed appropriately.  HEENT: Pupils equal, extraocular movements intact  Respiratory: Patient's speak in full sentences and does not appear short of breath  Cardiovascular: Trace lower extremity edema, non  tender, no erythema  Skin: Warm dry intact with no signs of infection or rash on extremities or on axial skeleton.  Abdomen: Soft nontender  Neuro: Cranial nerves II through XII are intact, neurovascularly intact in all extremities with 2+ DTRs and 2+ pulses.  Lymph: No lymphadenopathy of posterior or anterior cervical chain or axillae bilaterally.  Gait severely antalgic walking with the aid of a cane.  MSK:  tender with limited range of motion and good stability and symmetric strength  and tone of  elbows, wrist, hip, knee and ankles bilaterally.  Arthritic changes of multiple joints Shoulder: Left Inspection reveals no abnormalities, atrophy or asymmetry. Palpation is normal with no tenderness over AC joint or bicipital groove. ROM i mild decrease especially with abduction Rotator cuff strength 3+ out of 5 Positive impingement Speeds and Yergason's tests normal. Positive crossover Normal scapular function observed. Positive painful arc and drop arm sign No apprehension sign Contralateral shoulder unremarkable  MSK US performed of: left  This study was ordered, performed, and interpreted by [name] D.O.  Shoulder:   Supraspinatus: Full thickness tear noted with some cystic changes.  Seems to be an acute on chronic.  Mild increase in Doppler flow.  Patient does have some underlying arthritic changes noted.  Swelling of the acromioclavicular joint noted.  Mild arthritis there as well.  Subscapularis intact.  Patient did have significant swelling with intrasubstance tearing of the bicep tendon as well. Impression: Rotator cuff tear full-thickness of the supraspinatus reactive bicep tendinitis with underlying arthritis  [id number]; 15 additional minutes spent for Therapeutic exercises as stated in above notes.  This included exercises focusing on stretching, strengthening, with significant focus on eccentric aspects.   Long term goals include an improvement in range of motion, strength,  endurance as well as avoiding reinjury. Patient's frequency would include in 1-2 times a day, 3-5 times a week for a duration of 6-12 weeks.  Shoulder Exercises that included:  Basic scapular stabilization to include adduction and depression of scapula [place], focusing on proper movement and good control Internal and External rotation utilizing a theraband, with elbow tucked at side entire time Rows with theraband which was given  Proper technique shown and discussed handout in great detail with ATC.  All questions were discussed and answered.      Impression and Recommendations:     This case required medical decision making of moderate complexity. The above documentation has been reviewed and is accurate and complete Ashley Pulley, DO       Note: This dictation was prepared with Dragon dictation along with smaller phrase technology. Any transcriptional errors that result from this process are unintentional.

## 2018-02-01 ENCOUNTER — Ambulatory Visit: Payer: Self-pay

## 2018-02-01 ENCOUNTER — Encounter: Payer: Self-pay | Admitting: Family Medicine

## 2018-02-01 ENCOUNTER — Ambulatory Visit: Payer: Medicare Other | Admitting: Family Medicine

## 2018-02-01 VITALS — BP 140/82 | HR 68 | Ht 66.0 in | Wt 165.0 lb

## 2018-02-01 DIAGNOSIS — M75102 Unspecified rotator cuff tear or rupture of left shoulder, not specified as traumatic: Secondary | ICD-10-CM | POA: Insufficient documentation

## 2018-02-01 DIAGNOSIS — M25512 Pain in left shoulder: Secondary | ICD-10-CM | POA: Diagnosis not present

## 2018-02-01 DIAGNOSIS — G8929 Other chronic pain: Secondary | ICD-10-CM

## 2018-02-01 DIAGNOSIS — M75122 Complete rotator cuff tear or rupture of left shoulder, not specified as traumatic: Secondary | ICD-10-CM

## 2018-02-01 NOTE — Assessment & Plan Note (Signed)
Patient does have a left rotator cuff tear but does have some cystic changes near the insertion.  No abnormal vascularity no noted.  X-rays fairly unremarkable except for some mild arthritis.  Home exercise given and work with Product/process development scientist.  Discussed icing regimen, topical anti-inflammatories, which activities to do which wants to avoid.  Follow-up again in 4 to 8 weeks

## 2018-02-01 NOTE — Patient Instructions (Addendum)
Good to see you.   Ice 20 minutes 2 times daily. Usually after activity and before bed. Exercises 3 times a week.  pennsaid pinkie amount topically 2 times daily as needed.  Over the counter get  Vitamin D 2000 IU daily  Turmeric 500mg  daily  Tart cherry extract any dose at night See me again in 4-6 weeks to make sure you are doing better

## 2018-03-20 NOTE — Progress Notes (Addendum)
Subjective:   [name] is a 84 y.o. female who presents for Medicare Annual (Subsequent) preventive examination.  Review of Systems:  No ROS.  Medicare Wellness Visit. Additional risk factors are reflected in the social history.  Cardiac Risk Factors include: advanced age (>56men, >37 women);dyslipidemia;hypertension Sleep patterns: gets up 1-2 times nightly to void and sleeps 6-7 hours nightly.    Home Safety/Smoke Alarms: Feels safe in home. Smoke alarms in place.  Living environment; residence and Adult nurse: apartment, equipment: Cane, Type: [place], no firearms  . Resides at [facility] independent living. Lives with alone, no needs for DME, good support system Seat Belt Safety/Bike Helmet: Wears seat belt.      Objective:     Vitals: BP 138/82   Pulse 72   Resp 17   Ht 5\' 6"  (1.676 m)   Wt 165 lb (74.8 kg)   SpO2 98%   BMI 26.63 kg/m   Body mass index is 26.63 kg/m.  Advanced Directives 03/21/2018 03/20/2017 07/21/2015 12/19/2014 09/03/2012 08/28/2012  Does Patient Have a Medical Advance Directive? Yes Yes Yes Yes Patient has advance directive, copy in chart Patient has advance directive, copy in chart  Type of Advance Directive [place];Living will [place];Living will [place];Living will [place];Living will Healthcare Power of [place];Living will  Does patient want to make changes to medical advance directive? - - No - Patient declined No - Patient declined - -  Copy of [place] in Chart? No - copy requested No - copy requested No - copy requested Yes - -  Pre-existing out of facility DNR order (yellow form or pink MOST form) - - - - No No    Tobacco Social History   Tobacco Use  Smoking Status Never Smoker  Smokeless Tobacco Never Used     Counseling given: Not Answered  Past Medical History:  Diagnosis Date  .  Anxiety   . Breast cancer ([place]) 13 years ago   s/p tamoxifen   . COPD (chronic obstructive pulmonary disease) ([place])   . Depression   . Diverticulosis   . Esophageal stricture   . Gastric outlet obstruction   . GERD (gastroesophageal reflux disease)   . Glaucoma   . Hiatal hernia   . Hiatal hernia   . Hyperlipidemia   . Hypertension   . Insomnia   . Internal hemorrhoids   . Osteoarthritis   . Osteoporosis   . Peptic ulcer disease   . Rheumatic heart disease   . Ulcerative colitis    Past Surgical History:  Procedure Laterality Date  . ABDOMINAL HYSTERECTOMY    . BREAST RECONSTRUCTION  2000   right  . BREAST SURGERY     right  . CATARACT EXTRACTION  08/2006 and 10/2006  . HEMORRHOID SURGERY    . HIATAL HERNIA REPAIR N/A 09/03/2012   Procedure: LAPAROSCOPIC REPAIR OF HIATAL HERNIA;  Surgeon: Pedro Earls, MD;  [facility];  Service: General;  Laterality: N/A;  . JOINT REPLACEMENT     right  . LAPAROSCOPIC NISSEN FUNDOPLICATION N/A 1/61/0960   Procedure: LAP REPAIR OF LARGE TYPE III MIXED HIATUS HERNIA WITH NISSEN FUNOPLICATION;  Surgeon: Pedro Earls, MD;  Location[facility];  Service: General;  Laterality: N/A;  . [place]   right  . TONSILLECTOMY    . TOTAL KNEE ARTHROPLASTY     left  Family History  Problem Relation Age of Onset  . Coronary artery disease Sister        x 2 in 1's  . Coronary artery disease Brother        x 2 in 70's  . Diabetes Unknown   . Colon cancer Father 77  . Hypertension Unknown   . Stroke Mother 48  . Crohn's disease Brother   . Breast cancer Unknown        Aunt  . Cirrhosis Brother   . Liver disease Son    Social History   Socioeconomic History  . Marital status: Widowed    Spouse name: Not on file  . Number of children: 3  . Years of education: Not on file  . Highest education level: Not on file  Occupational History  . Occupation: retired-sales    Employer: RETIRED  Social Needs  . Financial resource  strain: Not hard at all  . Food insecurity:    Worry: Never true    Inability: Never true  . Transportation needs:    Medical: No    Non-medical: No  Tobacco Use  . Smoking status: Never Smoker  . Smokeless tobacco: Never Used  Substance and Sexual Activity  . Alcohol use: No    Alcohol/week: 0.0 standard drinks  . Drug use: No  . Sexual activity: Never  Lifestyle  . Physical activity:    Days per week: 0 days    Minutes per session: 0 min  . Stress: To some extent  Relationships  . Social connections:    Talks on phone: More than three times a week    Gets together: More than three times a week    Attends religious service: More than 4 times per year    Active member of club or organization: Yes    Attends meetings of clubs or organizations: More than 4 times per year    Relationship status: Widowed  Other Topics Concern  . Not on file  Social History Narrative  . Not on file    Outpatient Encounter Medications as of 03/21/2018  Medication Sig  . aspirin 81 MG tablet Take 81 mg by mouth daily.    [name] azelastine (ASTELIN) 137 MCG/SPRAY nasal spray Place 1 spray into the nose 2 (two) times daily as needed (for allergies). Reported on 07/20/2015  . Biotin (BIOTIN ULTRA STRENGTH) 10 MG CAPS Take 1 capsule by mouth daily.  [name] LORazepam (ATIVAN) 2 MG tablet TAKE 1/2 TABLET BY MOUTH EVERY 8 HOURS AS NEEDED FOR ANXIETY  . pantoprazole (PROTONIX) 40 MG tablet Take 1 tablet (40 mg total) by mouth daily.  . polyethylene glycol (MIRALAX / GLYCOLAX) packet Take 17 g by mouth daily.  . sertraline (ZOLOFT) 100 MG tablet Take 1 tablet (100 mg total) by mouth daily.  [name] telmisartan-hydrochlorothiazide (MICARDIS HCT) 40-12.5 MG tablet Take 1 tablet by mouth daily.  [name] umeclidinium-vilanterol (ANORO ELLIPTA) 62.5-25 MCG/INH AEPB Inhale 1 puff into the lungs daily.  . vitamin B-12 (CYANOCOBALAMIN) 1000 MCG tablet Take 1,000 mcg by mouth daily.    . [DISCONTINUED] Fluticasone-Salmeterol (ADVAIR  DISKUS) 100-50 MCG/DOSE AEPB Inhale 1 puff into the lungs 2 (two) times daily. (Patient not taking: Reported on 03/21/2018)   No facility-administered encounter medications on file as of 03/21/2018.     Activities of Daily Living In your present state of health, do you have any difficulty performing the following activities: 03/21/2018  Hearing? Y  Vision? N  Difficulty concentrating or making decisions?  N  Walking or climbing stairs? Y  Dressing or bathing? N  Doing errands, shopping? Y  Preparing Food and eating ? Y  Using the Toilet? N  In the past six months, have you accidently leaked urine? N  Do you have problems with loss of bowel control? N  Managing your Medications? N  Managing your Finances? N  Housekeeping or managing your Housekeeping? N  Some recent data might be hidden    Patient Care Team: Binnie Rail, MD as PCP - General (Internal Medicine) Gevena Cotton, MD as Consulting Physician (Ophthalmology) Lafayette Dragon, MD (Inactive) as Consulting Physician (Gastroenterology) Collene Gobble, MD as Consulting Physician (Pulmonary Disease)    Assessment:   This is a routine wellness examination for [name]. Physical assessment deferred to PCP.   Exercise Activities and Dietary recommendations Current Exercise Habits: Home exercise routine, Type of exercise: walking, Time (Minutes): 30, Frequency (Times/Week): 4, Weekly Exercise (Minutes/Week): 120, Intensity: Mild, Exercise limited by: orthopedic condition(s)  Diet (meal preparation, eat out, water intake, caffeinated beverages, dairy products, fruits and vegetables): in general, a "healthy" diet  , well balanced.   Reviewed heart healthy diet. Encouraged patient to increase daily water and healthy fluid intake.  Goals      Patient Stated   . Pt states she wants to remain independent.  (pt-stated)     Stay active/physical fit.    Prevent falls.    Help others.    Brain stimulating activity to keep mind  sharp.        Other   . Patient Stated     I will become more involved with the [employer] of which I live. Stay in contact with my friends and neighbors from [place]. Call Hospice to ask about grief counseling. I will start to do hobbies that I enjoy like jigsaw puzzles and paint.     . Patient Stated     I would like to start to work puzzles when I get my table set-up. Check into getting someone to pack up my home.       Fall Risk Fall Risk  03/21/2018 03/20/2017 07/21/2015 01/20/2015 01/20/2015  Falls in the past year? 0 Yes No Yes Yes  Comment - - - - -  Number falls in past yr: - 1 - 2 or more 1  Injury with Fall? - No - No No  Risk Factor Category  - - - High Fall Risk -  Risk for fall due to : History of fall(s);Impaired balance/gait - - Impaired balance/gait -  Follow up Falls prevention discussed;Education provided Falls prevention discussed - Education provided;Falls prevention discussed -    Depression Screen PHQ 2/9 Scores 03/21/2018 03/20/2017 07/21/2015 12/19/2014  PHQ - 2 Score 3 6 0 2  PHQ- 9 Score 6 12 - 3     Cognitive Function MMSE - Mini Mental State Exam 03/21/2018 03/20/2017 12/19/2014  Orientation to time 5 5 5   Orientation to Place 5 5 5   Registration 3 3 3   Attention/ Calculation 4 4 5   Recall 1 1 3   Language- name 2 objects 2 2 2   Language- repeat 1 1 1   Language- follow 3 step command 3 3 3   Language- read & follow direction 1 1 1   Write a sentence 1 1 1   Copy design 1 1 1   Total score 27 27 30         Immunization History  Administered Date(s) Administered  . Influenza Whole 04/02/2007, 01/02/2009, 01/06/2010,  01/10/2012  . Influenza, High Dose Seasonal PF 01/21/2013, 12/19/2014, 01/19/2016, 01/10/2017, 02/12/2018  . Influenza,inj,Quad PF,6+ Mos 12/27/2013  . Influenza-Unspecified 12/17/2016  . PPD Test 03/05/2013  . Pneumococcal Conjugate-13 05/23/2014  . Pneumococcal Polysaccharide-23 01/30/2008   Screening Tests Health Maintenance   Topic Date Due  . TETANUS/TDAP  01/18/1950  . DEXA SCAN  12/27/2018 (Originally 07/20/2017)  . INFLUENZA VACCINE  Completed  . PNA vac Low Risk Adult  Completed      Plan:      Continue doing brain stimulating activities (puzzles, reading, adult coloring books, staying active) to keep memory sharp.   Continue to eat heart healthy diet (full of fruits, vegetables, whole grains, lean protein, water--limit salt, fat, and sugar intake) and increase physical activity as tolerated.  I have personally reviewed and noted the following in the patient's chart:   . Medical and social history . Use of alcohol, tobacco or illicit drugs  . Current medications and supplements . Functional ability and status . Nutritional status . Physical activity . Advanced directives . List of other physicians . Vitals . Screenings to include cognitive, depression, and falls . Referrals and appointments  In addition, I have reviewed and discussed with patient certain preventive protocols, quality metrics, and best practice recommendations. A written personalized care plan for preventive services as well as general preventive health recommendations were provided to patient.     Michiel Cowboy, RN  03/21/2018   Medical screening examination/treatment/procedure(s) were performed by non-physician practitioner and as supervising physician I was immediately available for consultation/collaboration. I agree with above. Binnie Rail, MD

## 2018-03-21 ENCOUNTER — Ambulatory Visit (INDEPENDENT_AMBULATORY_CARE_PROVIDER_SITE_OTHER): Payer: Medicare Other | Admitting: *Deleted

## 2018-03-21 ENCOUNTER — Ambulatory Visit: Payer: Medicare Other | Admitting: Family Medicine

## 2018-03-21 VITALS — BP 138/82 | HR 72 | Resp 17 | Ht 66.0 in | Wt 165.0 lb

## 2018-03-21 DIAGNOSIS — Z0000 Encounter for general adult medical examination without abnormal findings: Secondary | ICD-10-CM | POA: Diagnosis not present

## 2018-03-21 NOTE — Patient Instructions (Addendum)
Continue doing brain stimulating activities (puzzles, reading, adult coloring books, staying active) to keep memory sharp.   Continue to eat heart healthy diet (full of fruits, vegetables, whole grains, lean protein, water--limit salt, fat, and sugar intake) and increase physical activity as tolerated.   [name] , Thank you for taking time to come for your Medicare Wellness Visit. I appreciate your ongoing commitment to your health goals. Please review the following plan we discussed and let me know if I can assist you in the future.   These are the goals we discussed: Goals      Patient Stated   . Pt states she wants to remain independent.  (pt-stated)     Stay active/physical fit.    Prevent falls.    Help others.    Brain stimulating activity to keep mind sharp.        Other   . Patient Stated     I will become more involved with the [employer] of which I live. Stay in contact with my friends and neighbors from [place]. Call [facility] to ask about grief counseling. I will start to do hobbies that I enjoy like jigsaw puzzles and paint.     . Patient Stated     I would like to start to work puzzles when I get my table set-up. Check into getting someone to pack up my home.       This is a list of the screening recommended for you and due dates:  Health Maintenance  Topic Date Due  . Tetanus Vaccine  01/18/1950  . DEXA scan (bone density measurement)  12/27/2018*  . Flu Shot  Completed  . Pneumonia vaccines  Completed  *Topic was postponed. The date shown is not the original due date.   If you or someone you know has experienced the death of a loved one, the support of others can play an invaluable role in the healing process. [facility] offers grief and loss services for anyone in the community. Contact us today [phone]   Health Maintenance, Female Adopting a healthy lifestyle and getting preventive care can go a long way to  promote health and wellness. Talk with your health care provider about what schedule of regular examinations is right for you. This is a good chance for you to check in with your provider about disease prevention and staying healthy. In between checkups, there are plenty of things you can do on your own. Experts have done a lot of research about which lifestyle changes and preventive measures are most likely to keep you healthy. Ask your health care provider for more information. Weight and diet Eat a healthy diet  Be sure to include plenty of vegetables, fruits, low-fat dairy products, and lean protein.  Do not eat a lot of foods high in solid fats, added sugars, or salt.  Get regular exercise. This is one of the most important things you can do for your health. ? Most adults should exercise for at least 150 minutes each week. The exercise should increase your heart rate and make you sweat (moderate-intensity exercise). ? Most adults should also do strengthening exercises at least twice a week. This is in addition to the moderate-intensity exercise.  Maintain a healthy weight  Body mass index (BMI) is a measurement that can be used to identify possible weight problems. It estimates body fat based on height and weight. Your health care provider can help determine your BMI and help  you achieve or maintain a healthy weight.  For females 67 years of age and older: ? A BMI below 18.5 is considered underweight. ? A BMI of 18.5 to 24.9 is normal. ? A BMI of 25 to 29.9 is considered overweight. ? A BMI of 30 and above is considered obese.  Watch levels of cholesterol and blood lipids  You should start having your blood tested for lipids and cholesterol at 81 years of age, then have this test every 5 years.  You may need to have your cholesterol levels checked more often if: ? Your lipid or cholesterol levels are high. ? You are older than 82 years of age. ? You are at high risk for heart  disease.  Cancer screening Lung Cancer  Lung cancer screening is recommended for adults 33-75 years old who are at high risk for lung cancer because of a history of smoking.  A yearly low-dose CT scan of the lungs is recommended for people who: ? Currently smoke. ? Have quit within the past 15 years. ? Have at least a 30-pack-year history of smoking. A pack year is smoking an average of one pack of cigarettes a day for 1 year.  Yearly screening should continue until it has been 15 years since you quit.  Yearly screening should stop if you develop a health problem that would prevent you from having lung cancer treatment.  Breast Cancer  Practice breast self-awareness. This means understanding how your breasts normally appear and feel.  It also means doing regular breast self-exams. Let your health care provider know about any changes, no matter how small.  If you are in your 20s or 30s, you should have a clinical breast exam (CBE) by a health care provider every 1-3 years as part of a regular health exam.  If you are 16 or older, have a CBE every year. Also consider having a breast X-ray (mammogram) every year.  If you have a family history of breast cancer, talk to your health care provider about genetic screening.  If you are at high risk for breast cancer, talk to your health care provider about having an MRI and a mammogram every year.  Breast cancer gene (BRCA) assessment is recommended for women who have family members with BRCA-related cancers. BRCA-related cancers include: ? Breast. ? Ovarian. ? Tubal. ? Peritoneal cancers.  Results of the assessment will determine the need for genetic counseling and BRCA1 and BRCA2 testing.  Cervical Cancer Your health care provider may recommend that you be screened regularly for cancer of the pelvic organs (ovaries, uterus, and vagina). This screening involves a pelvic examination, including checking for microscopic changes to the  surface of your cervix (Pap test). You may be encouraged to have this screening done every 3 years, beginning at age 7.  For women ages 25-65, health care providers may recommend pelvic exams and Pap testing every 3 years, or they may recommend the Pap and pelvic exam, combined with testing for human papilloma virus (HPV), every 5 years. Some types of HPV increase your risk of cervical cancer. Testing for HPV may also be done on women of any age with unclear Pap test results.  Other health care providers may not recommend any screening for nonpregnant women who are considered low risk for pelvic cancer and who do not have symptoms. Ask your health care provider if a screening pelvic exam is right for you.  If you have had past treatment for cervical cancer or a condition  that could lead to cancer, you need Pap tests and screening for cancer for at least 20 years after your treatment. If Pap tests have been discontinued, your risk factors (such as having a new sexual partner) need to be reassessed to determine if screening should resume. Some women have medical problems that increase the chance of getting cervical cancer. In these cases, your health care provider may recommend more frequent screening and Pap tests.  Colorectal Cancer  This type of cancer can be detected and often prevented.  Routine colorectal cancer screening usually begins at 82 years of age and continues through 82 years of age.  Your health care provider may recommend screening at an earlier age if you have risk factors for colon cancer.  Your health care provider may also recommend using home test kits to check for hidden blood in the stool.  A small camera at the end of a tube can be used to examine your colon directly (sigmoidoscopy or colonoscopy). This is done to check for the earliest forms of colorectal cancer.  Routine screening usually begins at age 79.  Direct examination of the colon should be repeated every 5-10  years through 82 years of age. However, you may need to be screened more often if early forms of precancerous polyps or small growths are found.  Skin Cancer  Check your skin from head to toe regularly.  Tell your health care provider about any new moles or changes in moles, especially if there is a change in a mole's shape or color.  Also tell your health care provider if you have a mole that is larger than the size of a pencil eraser.  Always use sunscreen. Apply sunscreen liberally and repeatedly throughout the day.  Protect yourself by wearing long sleeves, pants, a wide-brimmed hat, and sunglasses whenever you are outside.  Heart disease, diabetes, and high blood pressure  High blood pressure causes heart disease and increases the risk of stroke. High blood pressure is more likely to develop in: ? People who have blood pressure in the high end of the normal range (130-139/85-89 mm Hg). ? People who are overweight or obese. ? People who are African American.  If you are 55-84 years of age, have your blood pressure checked every 3-5 years. If you are 74 years of age or older, have your blood pressure checked every year. You should have your blood pressure measured twice-once when you are at a hospital or clinic, and once when you are not at a hospital or clinic. Record the average of the two measurements. To check your blood pressure when you are not at a hospital or clinic, you can use: ? An automated blood pressure machine at a pharmacy. ? A home blood pressure monitor.  If you are between 45 years and 80 years old, ask your health care provider if you should take aspirin to prevent strokes.  Have regular diabetes screenings. This involves taking a blood sample to check your fasting blood sugar level. ? If you are at a normal weight and have a low risk for diabetes, have this test once every three years after 82 years of age. ? If you are overweight and have a high risk for  diabetes, consider being tested at a younger age or more often. Preventing infection Hepatitis B  If you have a higher risk for hepatitis B, you should be screened for this virus. You are considered at high risk for hepatitis B if: ? You were  born in a country where hepatitis B is common. Ask your health care provider which countries are considered high risk. ? Your parents were born in a high-risk country, and you have not been immunized against hepatitis B (hepatitis B vaccine). ? You have HIV or AIDS. ? You use needles to inject street drugs. ? You live with someone who has hepatitis B. ? You have had sex with someone who has hepatitis B. ? You get hemodialysis treatment. ? You take certain medicines for conditions, including cancer, organ transplantation, and autoimmune conditions.  Hepatitis C  Blood testing is recommended for: ? Everyone born from [birth year] through 1963. ? Anyone with known risk factors for hepatitis C.  Sexually transmitted infections (STIs)  You should be screened for sexually transmitted infections (STIs) including gonorrhea and chlamydia if: ? You are sexually active and are younger than 82 years of age. ? You are older than 82 years of age and your health care provider tells you that you are at risk for this type of infection. ? Your sexual activity has changed since you were last screened and you are at an increased risk for chlamydia or gonorrhea. Ask your health care provider if you are at risk.  If you do not have HIV, but are at risk, it may be recommended that you take a prescription medicine daily to prevent HIV infection. This is called pre-exposure prophylaxis (PrEP). You are considered at risk if: ? You are sexually active and do not regularly use condoms or know the HIV status of your partner(s). ? You take drugs by injection. ? You are sexually active with a partner who has HIV.  Talk with your health care provider about whether you are at high risk  of being infected with HIV. If you choose to begin PrEP, you should first be tested for HIV. You should then be tested every 3 months for as long as you are taking PrEP. Pregnancy  If you are premenopausal and you may become pregnant, ask your health care provider about preconception counseling.  If you may become pregnant, take 400 to 800 micrograms (mcg) of folic acid every day.  If you want to prevent pregnancy, talk to your health care provider about birth control (contraception). Osteoporosis and menopause  Osteoporosis is a disease in which the bones lose minerals and strength with aging. This can result in serious bone fractures. Your risk for osteoporosis can be identified using a bone density scan.  If you are 53 years of age or older, or if you are at risk for osteoporosis and fractures, ask your health care provider if you should be screened.  Ask your health care provider whether you should take a calcium or vitamin D supplement to lower your risk for osteoporosis.  Menopause may have certain physical symptoms and risks.  Hormone replacement therapy may reduce some of these symptoms and risks. Talk to your health care provider about whether hormone replacement therapy is right for you. Follow these instructions at home:  Schedule regular health, dental, and eye exams.  Stay current with your immunizations.  Do not use any tobacco products including cigarettes, chewing tobacco, or electronic cigarettes.  If you are pregnant, do not drink alcohol.  If you are breastfeeding, limit how much and how often you drink alcohol.  Limit alcohol intake to no more than 1 drink per day for nonpregnant women. One drink equals 12 ounces of beer, 5 ounces of , or 1 ounces of hard liquor.  Do not use street drugs.  Do not share needles.  Ask your health care provider for help if you need support or information about quitting drugs.  Tell your health care provider if you often  feel depressed.  Tell your health care provider if you have ever been abused or do not feel safe at home. This information is not intended to replace advice given to you by your health care provider. Make sure you discuss any questions you have with your health care provider. Document Released: 10/18/2010 Document Revised: 09/10/2015 Document Reviewed: 01/06/2015 Elsevier Interactive Patient Education  [employer].   It is important to avoid accidents which may result in broken bones.  Here are a few ideas on how to make your home safer so you will be less likely to trip or fall.  1. Use nonskid mats or non slip strips in your shower or tub, on your bathroom floor and around sinks.  If you know that you have spilled water, wipe it up! 2. In the bathroom, it is important to have properly installed grab bars on the walls or on the edge of the tub.  Towel racks are NOT strong enough for you to hold onto or to pull on for support. 3. Stairs and hallways should have enough light.  Add lamps or night lights if you need ore light. 4. It is good to have handrails on both sides of the stairs if possible.  Always fix broken handrails right away. 5. It is important to see the edges of steps.  Paint the edges of outdoor steps white so you can see them better.  Put colored tape on the edge of inside steps. 6. Throw-rugs are dangerous because they can slide.  Removing the rugs is the best idea, but if they must stay, add adhesive carpet tape to prevent slipping. 7. Do not keep things on stairs or in the halls.  Remove small furniture that blocks the halls as it may cause you to trip.  Keep telephone and electrical cords out of the way where you walk. 8. Always were sturdy, rubber-soled shoes for good support.  Never wear just socks, especially on the stairs.  Socks may cause you to slip or fall.  Do not wear full-length housecoats as you can easily trip on the bottom.  9. Place the things you use the most  on the shelves that are the easiest to reach.  If you use a stepstool, make sure it is in good condition.  If you feel unsteady, DO NOT climb, ask for help. 10. If a health professional advises you to use a cane or walker, do not be ashamed.  These items can keep you from falling and breaking your bones.

## 2018-03-22 ENCOUNTER — Other Ambulatory Visit: Payer: Self-pay | Admitting: Internal Medicine

## 2018-03-23 NOTE — Telephone Encounter (Signed)
[place] Controlled Substance Database checked. Last filled on 12/26/17  Last OV 12/26/17

## 2018-04-24 DIAGNOSIS — H40003 Preglaucoma, unspecified, bilateral: Secondary | ICD-10-CM | POA: Diagnosis not present

## 2018-04-24 DIAGNOSIS — Z961 Presence of intraocular lens: Secondary | ICD-10-CM | POA: Diagnosis not present

## 2018-04-24 DIAGNOSIS — H43813 Vitreous degeneration, bilateral: Secondary | ICD-10-CM | POA: Diagnosis not present

## 2018-04-24 DIAGNOSIS — H02831 Dermatochalasis of right upper eyelid: Secondary | ICD-10-CM | POA: Diagnosis not present

## 2018-04-24 DIAGNOSIS — H02834 Dermatochalasis of left upper eyelid: Secondary | ICD-10-CM | POA: Diagnosis not present

## 2018-05-17 ENCOUNTER — Other Ambulatory Visit: Payer: Self-pay | Admitting: Internal Medicine

## 2018-05-29 DIAGNOSIS — H401133 Primary open-angle glaucoma, bilateral, severe stage: Secondary | ICD-10-CM | POA: Diagnosis not present

## 2018-06-26 NOTE — Patient Instructions (Addendum)
  Tests ordered today. Your results will be released to MyChart (or called to you) after review, usually within 72hours after test completion. If any changes need to be made, you will be notified at that same time.  Medications reviewed and updated.  Changes include :   none      Please followup in 6 months   

## 2018-06-26 NOTE — Progress Notes (Signed)
Subjective:    Patient ID: [name], female    DOB: July 08, 1930, 85 y.o.   MRN: [id number]  HPI The patient is here for follow up.  Depression: She is taking her medication daily as prescribed. She denies any side effects from the medication. She feels her depression is well controlled and she is happy with her current dose of medication.   Anxiety: She is taking her medication daily as prescribed. She denies any side effects from the medication. She feels her anxiety is well controlled and she is happy with her current dose of medication.   Hypertension: She is taking her medication daily. She is compliant with a low sodium diet.  She denies chest pain, palpitations, edema, shortness of breath and regular headaches. She is exercising - walks the halls.  She does not monitor her blood pressure at home.    Insomnia:  She is taking ativan nightly.  She sleeps well.   GERD:  She is taking her medication daily as prescribed.  She denies any GERD symptoms and feels her GERD is well controlled.   Prediabetes:  She is compliant with a low sugar/carbohydrate diet.  She is exercising regularly.  Dry cough:  It comes and goes - she has episodes of coughing and then it can go away for a few days.   She used an inhaler for a while and it caused irritation and she does not really think it worked.   She is using a humidifier and it is helping.  She can tolerate the cough. She states her GERD is controlled.   Fatigue:  She feels tired.  She gets good amount of sleep.  She feels tired when she wakes up.  She feels better as the day goes on.   Dizziness:  With sudden moves she gets transient dizziness and feels like she is going to fall.  She uses a cane.  She does not want to try PT.  She has had the dizziness for a long time - she is careful and has not fallen as much as she was when she was living at home.     Medications and allergies reviewed with patient and updated if appropriate.  Patient  Active Problem List   Diagnosis Date Noted  . Left rotator cuff tear 02/01/2018  . Insomnia 08/30/2017  . Adjustment disorder with mixed anxiety and depressed mood 08/30/2017  . Prediabetes 09/18/2016  . Grief 04/20/2016  . Thyroid nodule 01/19/2016  . Multiple falls 01/20/2015  . Hx MRSA infection 03/05/2013  . Lap Repair Large Hiatal Hernia with Nissen May 2014 09/20/2012  . Cough 09/13/2012  . Large type III mixed hiatus hernia 08/16/2012  . Carotid arterial disease ([place]) 10/31/2011  . OTH SPEC ALVEOL&PARIETOALVEOL PNEUMONOPATHIES 04/28/2010  . HEMORRHOIDS-INTERNAL 03/01/2010  . GASTRIC OUTLET OBSTRUCTION 02/10/2010  . RHEUMATIC HEART DISEASE 02/02/2010  . ESOPHAGEAL STRICTURE 02/02/2010  . GERD 02/02/2010  . DUODENAL ULCER 02/02/2010  . HIATAL HERNIA 02/02/2010  . DIVERTICULOSIS, COLON 02/02/2010  . OSTEOARTHRITIS 02/02/2010  . Acute pain of left shoulder 10/09/2009  . DECREASED HEARING, BILATERAL 02/06/2008  . PEPTIC ULCER DISEASE 06/07/2007  . Hyperlipidemia 12/19/2006  . Anxiety and depression 12/19/2006  . COPD 12/19/2006  . GLAUCOMA 09/04/2006  . HTN (hypertension) 09/04/2006  . Osteoporosis 09/04/2006  . PREMATURE VENTRICULAR CONTRACTIONS 10/06/1994  . MASTECTOMY, RIGHT, HX OF 04/18/1993    Current Outpatient Medications on File Prior to Visit  Medication Sig Dispense Refill  . aspirin 81  MG tablet Take 81 mg by mouth daily.      [name] azelastine (ASTELIN) 137 MCG/SPRAY nasal spray Place 1 spray into the nose 2 (two) times daily as needed (for allergies). Reported on 07/20/2015    . Biotin (BIOTIN ULTRA STRENGTH) 10 MG CAPS Take 1 capsule by mouth daily.    [name] LORazepam (ATIVAN) 2 MG tablet TAKE 1/2 TABLET BY MOUTH EVERY 8 HOURS AS NEEDED FOR ANXIETY 60 tablet 0  . pantoprazole (PROTONIX) 40 MG tablet TAKE 1 TABLET BY MOUTH EVERY DAY 90 tablet 1  . polyethylene glycol (MIRALAX / GLYCOLAX) packet Take 17 g by mouth daily.    . sertraline (ZOLOFT) 100 MG tablet Take 1  tablet (100 mg total) by mouth daily. 90 tablet 1  . telmisartan-hydrochlorothiazide (MICARDIS HCT) 40-12.5 MG tablet Take 1 tablet by mouth daily. 90 tablet 3  . vitamin B-12 (CYANOCOBALAMIN) 1000 MCG tablet Take 1,000 mcg by mouth daily.       No current facility-administered medications on file prior to visit.     Past Medical History:  Diagnosis Date  . Anxiety   . Breast cancer ([place]) 13 years ago   s/p tamoxifen   . COPD (chronic obstructive pulmonary disease) ([place])   . Depression   . Diverticulosis   . Esophageal stricture   . Gastric outlet obstruction   . GERD (gastroesophageal reflux disease)   . Glaucoma   . Hiatal hernia   . Hiatal hernia   . Hyperlipidemia   . Hypertension   . Insomnia   . Internal hemorrhoids   . Osteoarthritis   . Osteoporosis   . Peptic ulcer disease   . Rheumatic heart disease   . Ulcerative colitis     Past Surgical History:  Procedure Laterality Date  . ABDOMINAL HYSTERECTOMY    . BREAST RECONSTRUCTION  2000   right  . BREAST SURGERY     right  . CATARACT EXTRACTION  08/2006 and 10/2006  . HEMORRHOID SURGERY    . HIATAL HERNIA REPAIR N/A 09/03/2012   Procedure: LAPAROSCOPIC REPAIR OF HIATAL HERNIA;  Surgeon: Pedro Earls, MD;  Location: [facility];  Service: General;  Laterality: N/A;  . JOINT REPLACEMENT     right  . LAPAROSCOPIC NISSEN FUNDOPLICATION N/A 6/31/4970   Procedure: LAP REPAIR OF LARGE TYPE III MIXED HIATUS HERNIA WITH NISSEN FUNOPLICATION;  Surgeon: Pedro Earls, MD;  Location: [facility];  Service: General;  Laterality: N/A;  . [place]   right  . TONSILLECTOMY    . TOTAL KNEE ARTHROPLASTY     left    Social History   Socioeconomic History  . Marital status: Widowed    Spouse name: Not on file  . Number of children: 3  . Years of education: Not on file  . Highest education level: Not on file  Occupational History  . Occupation: retired-sales    Employer: RETIRED  Social Needs  . Financial resource  strain: Not hard at all  . Food insecurity:    Worry: Never true    Inability: Never true  . Transportation needs:    Medical: No    Non-medical: No  Tobacco Use  . Smoking status: Never Smoker  . Smokeless tobacco: Never Used  Substance and Sexual Activity  . Alcohol use: No    Alcohol/week: 0.0 standard drinks  . Drug use: No  . Sexual activity: Never  Lifestyle  . Physical activity:    Days per week: 0 days  Minutes per session: 0 min  . Stress: To some extent  Relationships  . Social connections:    Talks on phone: More than three times a week    Gets together: More than three times a week    Attends religious service: More than 4 times per year    Active member of club or organization: Yes    Attends meetings of clubs or organizations: More than 4 times per year    Relationship status: Widowed  Other Topics Concern  . Not on file  Social History Narrative  . Not on file    Family History  Problem Relation Age of Onset  . Coronary artery disease Sister        x 2 in 84's  . Coronary artery disease Brother        x 2 in 70's  . Diabetes Unknown   . Colon cancer Father 84  . Hypertension Unknown   . Stroke Mother 60  . Crohn's disease Brother   . Breast cancer Unknown        Aunt  . Cirrhosis Brother   . Liver disease Son     Review of Systems  Constitutional: Negative for fever.  HENT: Positive for hearing loss and sore throat (in morning). Negative for congestion, ear pain and sinus pain.   Respiratory: Positive for cough and shortness of breath (with moderate exertion). Negative for wheezing.   Cardiovascular: Positive for leg swelling. Negative for chest pain and palpitations.  Gastrointestinal:       No gerd  Neurological: Positive for dizziness and headaches.       Objective:   Vitals:   06/27/18 1107  BP: (!) 148/96  Pulse: 68  Resp: 16  Temp: 98.5 F (36.9 C)  SpO2: 95%   BP Readings from Last 3 Encounters:  06/27/18 (!) 148/96    03/21/18 138/82  02/01/18 140/82   Wt Readings from Last 3 Encounters:  06/27/18 165 lb (74.8 kg)  03/21/18 165 lb (74.8 kg)  02/01/18 165 lb (74.8 kg)   Body mass index is 26.63 kg/m.   Physical Exam    GENERAL APPEARANCE: Appears stated age, well appearing, NAD EYES: conjunctiva clear, no icterus HEENT: bilateral tympanic membranes and ear canals normal, oropharynx with no erythema, no thyromegaly, trachea midline, no cervical or supraclavicular lymphadenopathy LUNGS: Clear to auscultation without wheeze or crackles, unlabored breathing, good air entry bilaterally CARDIOVASCULAR: Normal S1,S2 without murmurs, trace b/l LE edema SKIN: Warm, dry PSYCH: normal mood and affect      Assessment & Plan:    See Problem List for Assessment and Plan of chronic medical problems.

## 2018-06-27 ENCOUNTER — Other Ambulatory Visit (INDEPENDENT_AMBULATORY_CARE_PROVIDER_SITE_OTHER): Payer: Medicare Other

## 2018-06-27 ENCOUNTER — Encounter: Payer: Self-pay | Admitting: Internal Medicine

## 2018-06-27 ENCOUNTER — Other Ambulatory Visit: Payer: Self-pay

## 2018-06-27 ENCOUNTER — Ambulatory Visit (INDEPENDENT_AMBULATORY_CARE_PROVIDER_SITE_OTHER): Payer: Medicare Other | Admitting: Internal Medicine

## 2018-06-27 VITALS — BP 148/96 | HR 68 | Temp 98.5°F | Resp 16 | Ht 66.0 in | Wt 165.0 lb

## 2018-06-27 DIAGNOSIS — F4323 Adjustment disorder with mixed anxiety and depressed mood: Secondary | ICD-10-CM | POA: Diagnosis not present

## 2018-06-27 DIAGNOSIS — I10 Essential (primary) hypertension: Secondary | ICD-10-CM

## 2018-06-27 DIAGNOSIS — R42 Dizziness and giddiness: Secondary | ICD-10-CM

## 2018-06-27 DIAGNOSIS — R7303 Prediabetes: Secondary | ICD-10-CM | POA: Diagnosis not present

## 2018-06-27 DIAGNOSIS — K219 Gastro-esophageal reflux disease without esophagitis: Secondary | ICD-10-CM

## 2018-06-27 DIAGNOSIS — G4700 Insomnia, unspecified: Secondary | ICD-10-CM

## 2018-06-27 DIAGNOSIS — R059 Cough, unspecified: Secondary | ICD-10-CM

## 2018-06-27 DIAGNOSIS — R5383 Other fatigue: Secondary | ICD-10-CM | POA: Diagnosis not present

## 2018-06-27 DIAGNOSIS — R05 Cough: Secondary | ICD-10-CM

## 2018-06-27 LAB — COMPREHENSIVE METABOLIC PANEL
ALT: 7 U/L (ref 0–35)
AST: 15 U/L (ref 0–37)
Albumin: 4.5 g/dL (ref 3.5–5.2)
Alkaline Phosphatase: 94 U/L (ref 39–117)
BUN: 14 mg/dL (ref 6–23)
CO2: 31 mEq/L (ref 19–32)
CREATININE: 0.77 mg/dL (ref 0.40–1.20)
Calcium: 9.7 mg/dL (ref 8.4–10.5)
Chloride: 101 mEq/L (ref 96–112)
GFR: 71.17 mL/min (ref >60.00)
Glucose, Bld: 112 mg/dL — ABNORMAL HIGH (ref 70–99)
Potassium: 4 mEq/L (ref 3.5–5.1)
Sodium: 140 mEq/L (ref 135–145)
Total Bilirubin: 0.6 mg/dL (ref 0.2–1.2)
Total Protein: 7.6 g/dL (ref 6.0–8.3)

## 2018-06-27 LAB — CBC WITH DIFFERENTIAL/PLATELET
BASOS ABS: 0.1 10*3/uL (ref 0.0–0.1)
BASOS PCT: 1 % (ref 0.0–3.0)
EOS ABS: 0.2 10*3/uL (ref 0.0–0.7)
Eosinophils Relative: 2.5 % (ref 0.0–5.0)
HCT: 40.7 % (ref 36.0–46.0)
Hemoglobin: 13.7 g/dL (ref 12.0–15.0)
Lymphocytes Relative: 25.3 % (ref 12.0–46.0)
Lymphs Abs: 1.7 10*3/uL (ref 0.7–4.0)
MCHC: 33.7 g/dL (ref 30.0–36.0)
MCV: 96.6 fl (ref 78.0–100.0)
Monocytes Absolute: 0.4 10*3/uL (ref 0.1–1.0)
Monocytes Relative: 5.6 % (ref 3.0–12.0)
NEUTROS ABS: 4.5 10*3/uL (ref 1.4–7.7)
Neutrophils Relative %: 65.6 % (ref 43.0–77.0)
PLATELETS: 217 10*3/uL (ref 150.0–400.0)
RBC: 4.21 Mil/uL (ref 3.87–5.11)
RDW: 14.2 % (ref 11.5–15.5)
WBC: 6.8 10*3/uL (ref 4.0–10.5)

## 2018-06-27 LAB — TSH: TSH: 4.42 u[IU]/mL (ref 0.35–4.50)

## 2018-06-27 LAB — HEMOGLOBIN A1C: HEMOGLOBIN A1C: 6.3 % (ref 4.6–6.5)

## 2018-06-27 NOTE — Assessment & Plan Note (Signed)
Has had dizziness for > one year It is associated with head movements and is transient - causes her to feel off balance like she will fall Deferred PT Will refer to ENT to confirm inner ear issue Will hold off on imaging at this time

## 2018-06-27 NOTE — Assessment & Plan Note (Signed)
GERD controlled Continue daily medication  

## 2018-06-27 NOTE — Assessment & Plan Note (Signed)
Blood pressure overall controlled Continue current medication at current dose

## 2018-06-27 NOTE — Assessment & Plan Note (Signed)
Dry cough that occurs in episodes-but not daily She stopped the inhaler because she felt this was causing more irritation and did not really help Has started using a humidifier that seems to be helping-we will continue Cough does not bother her enough for further evaluation Will monitor

## 2018-06-27 NOTE — Assessment & Plan Note (Signed)
Controlled with ativan 1 mg at bedtime ,which she has been on for years Discussed that this may possibly be causing the morning fatigue and we could try decreasing the dose, but she did not want to do that Continue current dose for now

## 2018-06-27 NOTE — Assessment & Plan Note (Signed)
Having increased fatigue-particularly in the morning and increases a day goes on Check blood work including CBC, CMP, TSH Discussed this could be related to lorazepam she is taking at night-she is not interested in trying to decrease the dose Likely related to sertraline since she takes in the morning and is very fatigued when she wakes up Continue regular exercise Depression well controlled

## 2018-06-27 NOTE — Assessment & Plan Note (Signed)
Anxiety and depression well-controlled We will continue sertraline at current dose

## 2018-06-27 NOTE — Assessment & Plan Note (Signed)
Check a1c Low sugar / carb diet Stressed regular exercise   

## 2018-07-31 ENCOUNTER — Other Ambulatory Visit: Payer: Self-pay | Admitting: Internal Medicine

## 2018-07-31 NOTE — Telephone Encounter (Signed)
[place] Controlled Database Checked Last filled: 03/23/18 # 60 LOV w/you: 06/27/18 Next appt w/you: 01/04/19

## 2018-08-09 ENCOUNTER — Other Ambulatory Visit: Payer: Self-pay | Admitting: Internal Medicine

## 2018-09-11 ENCOUNTER — Ambulatory Visit: Payer: Self-pay

## 2018-09-11 NOTE — Telephone Encounter (Signed)
Incoming call from Patient who states that she week ago.  Says she hurt her middle back.   It has a yellow discoloration to it.  Did not loose conscious.  Denies any  Numbness of her extremities.  Pain is  Rated  Mild.  Patient does not want to make a appt.   Will call back .  Reason for Disposition . [1] High-risk adult (e.g., age > 31, osteoporosis, chronic steroid use) AND [2] still hurts  Answer Assessment - Initial Assessment Questions 1. MECHANISM: "How did the injury happen?" (Consider the possibility of domestic violence or elder abuse)     denies 2. ONSET: "When did the injury happen?" (Minutes or hours ago)     A week  3. LOCATION: "What part of the back is injured?"     Middle and right side. SEVERITY: "Can you move the back normally?"     yes 5. PAIN: "Is there any pain?" If so, ask: "How bad is the pain?"   (Scale 1-10; or mild, moderate, severe)     mild 6. CORD SYMPTOMS: Any weakness or numbness of the arms or legs?"     denies 7. SIZE: For cuts, bruises, or swelling, ask: "How large is it?" (e.g., inches or centimeters)     Yellow spot 8. TETANUS: For any breaks in the skin, ask: "When was the last tetanus booster?"     denies 9. OTHER SYMPTOMS: "Do you have any other symptoms?" (e.g., abdominal pain, blood in urine)    denies 10. PREGNANCY: "Is there any chance you are pregnant?" "When was your last menstrual period?"       na  Protocols used: BACK INJURY-A-AH

## 2018-09-12 NOTE — Telephone Encounter (Signed)
Called pt to check on her today. LVM for her to call back if needed.

## 2018-10-30 ENCOUNTER — Other Ambulatory Visit: Payer: Self-pay | Admitting: Internal Medicine

## 2018-10-30 DIAGNOSIS — H401133 Primary open-angle glaucoma, bilateral, severe stage: Secondary | ICD-10-CM | POA: Diagnosis not present

## 2018-10-30 NOTE — Telephone Encounter (Signed)
Check [place] last filled 07/31/2018.Marland KitchenJohny Gray

## 2018-11-08 ENCOUNTER — Other Ambulatory Visit: Payer: Self-pay | Admitting: Internal Medicine

## 2018-11-30 ENCOUNTER — Other Ambulatory Visit: Payer: Self-pay | Admitting: Internal Medicine

## 2018-12-03 NOTE — Telephone Encounter (Signed)
Last OV 06/27/18 Next OV 01/04/19 Last RF 10/30/18

## 2018-12-12 DIAGNOSIS — I951 Orthostatic hypotension: Secondary | ICD-10-CM | POA: Diagnosis not present

## 2018-12-12 DIAGNOSIS — R682 Dry mouth, unspecified: Secondary | ICD-10-CM | POA: Diagnosis not present

## 2018-12-12 DIAGNOSIS — R42 Dizziness and giddiness: Secondary | ICD-10-CM | POA: Diagnosis not present

## 2018-12-12 DIAGNOSIS — H903 Sensorineural hearing loss, bilateral: Secondary | ICD-10-CM | POA: Diagnosis not present

## 2018-12-23 ENCOUNTER — Other Ambulatory Visit: Payer: Self-pay | Admitting: Internal Medicine

## 2018-12-30 DIAGNOSIS — M25552 Pain in left hip: Secondary | ICD-10-CM | POA: Diagnosis not present

## 2018-12-30 DIAGNOSIS — S52602A Unspecified fracture of lower end of left ulna, initial encounter for closed fracture: Secondary | ICD-10-CM | POA: Diagnosis not present

## 2018-12-30 DIAGNOSIS — M7989 Other specified soft tissue disorders: Secondary | ICD-10-CM | POA: Diagnosis not present

## 2018-12-30 DIAGNOSIS — S32591A Other specified fracture of right pubis, initial encounter for closed fracture: Secondary | ICD-10-CM | POA: Diagnosis not present

## 2018-12-30 DIAGNOSIS — W19XXXA Unspecified fall, initial encounter: Secondary | ICD-10-CM | POA: Diagnosis not present

## 2018-12-30 DIAGNOSIS — S0990XA Unspecified injury of head, initial encounter: Secondary | ICD-10-CM | POA: Diagnosis not present

## 2018-12-30 DIAGNOSIS — M791 Myalgia, unspecified site: Secondary | ICD-10-CM | POA: Diagnosis not present

## 2018-12-30 DIAGNOSIS — I10 Essential (primary) hypertension: Secondary | ICD-10-CM | POA: Diagnosis not present

## 2018-12-30 DIAGNOSIS — Z7982 Long term (current) use of aspirin: Secondary | ICD-10-CM | POA: Diagnosis not present

## 2018-12-30 DIAGNOSIS — R42 Dizziness and giddiness: Secondary | ICD-10-CM | POA: Diagnosis not present

## 2018-12-30 DIAGNOSIS — G8911 Acute pain due to trauma: Secondary | ICD-10-CM | POA: Diagnosis not present

## 2018-12-30 DIAGNOSIS — S52692A Other fracture of lower end of left ulna, initial encounter for closed fracture: Secondary | ICD-10-CM | POA: Diagnosis not present

## 2018-12-30 DIAGNOSIS — S3282XA Multiple fractures of pelvis without disruption of pelvic ring, initial encounter for closed fracture: Secondary | ICD-10-CM | POA: Diagnosis not present

## 2018-12-30 DIAGNOSIS — S199XXA Unspecified injury of neck, initial encounter: Secondary | ICD-10-CM | POA: Diagnosis not present

## 2018-12-31 DIAGNOSIS — S32591A Other specified fracture of right pubis, initial encounter for closed fracture: Secondary | ICD-10-CM | POA: Diagnosis not present

## 2018-12-31 DIAGNOSIS — S199XXA Unspecified injury of neck, initial encounter: Secondary | ICD-10-CM | POA: Diagnosis not present

## 2018-12-31 DIAGNOSIS — R42 Dizziness and giddiness: Secondary | ICD-10-CM | POA: Diagnosis not present

## 2018-12-31 DIAGNOSIS — M7989 Other specified soft tissue disorders: Secondary | ICD-10-CM | POA: Diagnosis not present

## 2018-12-31 DIAGNOSIS — S0990XA Unspecified injury of head, initial encounter: Secondary | ICD-10-CM | POA: Diagnosis not present

## 2018-12-31 DIAGNOSIS — M25552 Pain in left hip: Secondary | ICD-10-CM | POA: Diagnosis not present

## 2018-12-31 DIAGNOSIS — S52602A Unspecified fracture of lower end of left ulna, initial encounter for closed fracture: Secondary | ICD-10-CM | POA: Diagnosis not present

## 2019-01-01 DIAGNOSIS — M25532 Pain in left wrist: Secondary | ICD-10-CM | POA: Diagnosis not present

## 2019-01-04 ENCOUNTER — Ambulatory Visit: Payer: Medicare Other | Admitting: Internal Medicine

## 2019-01-23 DIAGNOSIS — M25532 Pain in left wrist: Secondary | ICD-10-CM | POA: Diagnosis not present

## 2019-01-28 DIAGNOSIS — M25532 Pain in left wrist: Secondary | ICD-10-CM | POA: Diagnosis not present

## 2019-01-28 DIAGNOSIS — K449 Diaphragmatic hernia without obstruction or gangrene: Secondary | ICD-10-CM | POA: Diagnosis not present

## 2019-01-28 DIAGNOSIS — Z7982 Long term (current) use of aspirin: Secondary | ICD-10-CM | POA: Diagnosis not present

## 2019-01-28 DIAGNOSIS — Z96653 Presence of artificial knee joint, bilateral: Secondary | ICD-10-CM | POA: Diagnosis not present

## 2019-01-28 DIAGNOSIS — Z9181 History of falling: Secondary | ICD-10-CM | POA: Diagnosis not present

## 2019-01-28 DIAGNOSIS — S62102D Fracture of unspecified carpal bone, left wrist, subsequent encounter for fracture with routine healing: Secondary | ICD-10-CM | POA: Diagnosis not present

## 2019-01-28 DIAGNOSIS — S329XXD Fracture of unspecified parts of lumbosacral spine and pelvis, subsequent encounter for fracture with routine healing: Secondary | ICD-10-CM | POA: Diagnosis not present

## 2019-01-28 DIAGNOSIS — Z853 Personal history of malignant neoplasm of breast: Secondary | ICD-10-CM | POA: Diagnosis not present

## 2019-02-01 DIAGNOSIS — S329XXD Fracture of unspecified parts of lumbosacral spine and pelvis, subsequent encounter for fracture with routine healing: Secondary | ICD-10-CM | POA: Diagnosis not present

## 2019-02-01 DIAGNOSIS — K449 Diaphragmatic hernia without obstruction or gangrene: Secondary | ICD-10-CM | POA: Diagnosis not present

## 2019-02-01 DIAGNOSIS — Z853 Personal history of malignant neoplasm of breast: Secondary | ICD-10-CM | POA: Diagnosis not present

## 2019-02-01 DIAGNOSIS — M25532 Pain in left wrist: Secondary | ICD-10-CM | POA: Diagnosis not present

## 2019-02-01 DIAGNOSIS — Z96653 Presence of artificial knee joint, bilateral: Secondary | ICD-10-CM | POA: Diagnosis not present

## 2019-02-01 DIAGNOSIS — S62102D Fracture of unspecified carpal bone, left wrist, subsequent encounter for fracture with routine healing: Secondary | ICD-10-CM | POA: Diagnosis not present

## 2019-02-01 DIAGNOSIS — Z9181 History of falling: Secondary | ICD-10-CM | POA: Diagnosis not present

## 2019-02-01 DIAGNOSIS — Z7982 Long term (current) use of aspirin: Secondary | ICD-10-CM | POA: Diagnosis not present

## 2019-02-03 ENCOUNTER — Other Ambulatory Visit: Payer: Self-pay | Admitting: Internal Medicine

## 2019-02-06 ENCOUNTER — Other Ambulatory Visit: Payer: Self-pay | Admitting: Internal Medicine

## 2019-02-06 DIAGNOSIS — Z9181 History of falling: Secondary | ICD-10-CM | POA: Diagnosis not present

## 2019-02-06 DIAGNOSIS — S329XXD Fracture of unspecified parts of lumbosacral spine and pelvis, subsequent encounter for fracture with routine healing: Secondary | ICD-10-CM | POA: Diagnosis not present

## 2019-02-06 DIAGNOSIS — Z7982 Long term (current) use of aspirin: Secondary | ICD-10-CM | POA: Diagnosis not present

## 2019-02-06 DIAGNOSIS — Z96653 Presence of artificial knee joint, bilateral: Secondary | ICD-10-CM | POA: Diagnosis not present

## 2019-02-06 DIAGNOSIS — S62102D Fracture of unspecified carpal bone, left wrist, subsequent encounter for fracture with routine healing: Secondary | ICD-10-CM | POA: Diagnosis not present

## 2019-02-06 DIAGNOSIS — K449 Diaphragmatic hernia without obstruction or gangrene: Secondary | ICD-10-CM | POA: Diagnosis not present

## 2019-02-06 DIAGNOSIS — M25532 Pain in left wrist: Secondary | ICD-10-CM | POA: Diagnosis not present

## 2019-02-06 DIAGNOSIS — Z853 Personal history of malignant neoplasm of breast: Secondary | ICD-10-CM | POA: Diagnosis not present

## 2019-02-08 DIAGNOSIS — Z853 Personal history of malignant neoplasm of breast: Secondary | ICD-10-CM | POA: Diagnosis not present

## 2019-02-08 DIAGNOSIS — Z96653 Presence of artificial knee joint, bilateral: Secondary | ICD-10-CM | POA: Diagnosis not present

## 2019-02-08 DIAGNOSIS — Z9181 History of falling: Secondary | ICD-10-CM | POA: Diagnosis not present

## 2019-02-08 DIAGNOSIS — K449 Diaphragmatic hernia without obstruction or gangrene: Secondary | ICD-10-CM | POA: Diagnosis not present

## 2019-02-08 DIAGNOSIS — S329XXD Fracture of unspecified parts of lumbosacral spine and pelvis, subsequent encounter for fracture with routine healing: Secondary | ICD-10-CM | POA: Diagnosis not present

## 2019-02-08 DIAGNOSIS — Z7982 Long term (current) use of aspirin: Secondary | ICD-10-CM | POA: Diagnosis not present

## 2019-02-08 DIAGNOSIS — M25532 Pain in left wrist: Secondary | ICD-10-CM | POA: Diagnosis not present

## 2019-02-08 DIAGNOSIS — S62102D Fracture of unspecified carpal bone, left wrist, subsequent encounter for fracture with routine healing: Secondary | ICD-10-CM | POA: Diagnosis not present

## 2019-02-11 DIAGNOSIS — Z7982 Long term (current) use of aspirin: Secondary | ICD-10-CM | POA: Diagnosis not present

## 2019-02-11 DIAGNOSIS — Z9181 History of falling: Secondary | ICD-10-CM | POA: Diagnosis not present

## 2019-02-11 DIAGNOSIS — S329XXD Fracture of unspecified parts of lumbosacral spine and pelvis, subsequent encounter for fracture with routine healing: Secondary | ICD-10-CM | POA: Diagnosis not present

## 2019-02-11 DIAGNOSIS — Z96653 Presence of artificial knee joint, bilateral: Secondary | ICD-10-CM | POA: Diagnosis not present

## 2019-02-11 DIAGNOSIS — S62102D Fracture of unspecified carpal bone, left wrist, subsequent encounter for fracture with routine healing: Secondary | ICD-10-CM | POA: Diagnosis not present

## 2019-02-11 DIAGNOSIS — Z853 Personal history of malignant neoplasm of breast: Secondary | ICD-10-CM | POA: Diagnosis not present

## 2019-02-11 DIAGNOSIS — M25532 Pain in left wrist: Secondary | ICD-10-CM | POA: Diagnosis not present

## 2019-02-11 DIAGNOSIS — K449 Diaphragmatic hernia without obstruction or gangrene: Secondary | ICD-10-CM | POA: Diagnosis not present

## 2019-02-13 DIAGNOSIS — S62102D Fracture of unspecified carpal bone, left wrist, subsequent encounter for fracture with routine healing: Secondary | ICD-10-CM | POA: Diagnosis not present

## 2019-02-13 DIAGNOSIS — Z96653 Presence of artificial knee joint, bilateral: Secondary | ICD-10-CM | POA: Diagnosis not present

## 2019-02-13 DIAGNOSIS — M25532 Pain in left wrist: Secondary | ICD-10-CM | POA: Diagnosis not present

## 2019-02-13 DIAGNOSIS — Z7982 Long term (current) use of aspirin: Secondary | ICD-10-CM | POA: Diagnosis not present

## 2019-02-13 DIAGNOSIS — S329XXD Fracture of unspecified parts of lumbosacral spine and pelvis, subsequent encounter for fracture with routine healing: Secondary | ICD-10-CM | POA: Diagnosis not present

## 2019-02-13 DIAGNOSIS — K449 Diaphragmatic hernia without obstruction or gangrene: Secondary | ICD-10-CM | POA: Diagnosis not present

## 2019-02-13 DIAGNOSIS — Z853 Personal history of malignant neoplasm of breast: Secondary | ICD-10-CM | POA: Diagnosis not present

## 2019-02-13 DIAGNOSIS — Z9181 History of falling: Secondary | ICD-10-CM | POA: Diagnosis not present

## 2019-02-18 ENCOUNTER — Other Ambulatory Visit: Payer: Self-pay | Admitting: Internal Medicine

## 2019-02-18 DIAGNOSIS — M25532 Pain in left wrist: Secondary | ICD-10-CM | POA: Diagnosis not present

## 2019-02-18 DIAGNOSIS — S329XXD Fracture of unspecified parts of lumbosacral spine and pelvis, subsequent encounter for fracture with routine healing: Secondary | ICD-10-CM | POA: Diagnosis not present

## 2019-02-18 DIAGNOSIS — Z9181 History of falling: Secondary | ICD-10-CM | POA: Diagnosis not present

## 2019-02-18 DIAGNOSIS — Z96653 Presence of artificial knee joint, bilateral: Secondary | ICD-10-CM | POA: Diagnosis not present

## 2019-02-18 DIAGNOSIS — K449 Diaphragmatic hernia without obstruction or gangrene: Secondary | ICD-10-CM | POA: Diagnosis not present

## 2019-02-18 DIAGNOSIS — Z7982 Long term (current) use of aspirin: Secondary | ICD-10-CM | POA: Diagnosis not present

## 2019-02-18 DIAGNOSIS — Z853 Personal history of malignant neoplasm of breast: Secondary | ICD-10-CM | POA: Diagnosis not present

## 2019-02-18 DIAGNOSIS — S62102D Fracture of unspecified carpal bone, left wrist, subsequent encounter for fracture with routine healing: Secondary | ICD-10-CM | POA: Diagnosis not present

## 2019-02-20 DIAGNOSIS — Z853 Personal history of malignant neoplasm of breast: Secondary | ICD-10-CM | POA: Diagnosis not present

## 2019-02-20 DIAGNOSIS — I5033 Acute on chronic diastolic (congestive) heart failure: Secondary | ICD-10-CM | POA: Diagnosis not present

## 2019-02-20 DIAGNOSIS — I10 Essential (primary) hypertension: Secondary | ICD-10-CM | POA: Diagnosis not present

## 2019-02-20 DIAGNOSIS — J168 Pneumonia due to other specified infectious organisms: Secondary | ICD-10-CM | POA: Diagnosis not present

## 2019-02-20 DIAGNOSIS — J181 Lobar pneumonia, unspecified organism: Secondary | ICD-10-CM | POA: Diagnosis not present

## 2019-02-20 DIAGNOSIS — S4991XA Unspecified injury of right shoulder and upper arm, initial encounter: Secondary | ICD-10-CM | POA: Diagnosis not present

## 2019-02-20 DIAGNOSIS — K219 Gastro-esophageal reflux disease without esophagitis: Secondary | ICD-10-CM | POA: Diagnosis not present

## 2019-02-20 DIAGNOSIS — S4992XA Unspecified injury of left shoulder and upper arm, initial encounter: Secondary | ICD-10-CM | POA: Diagnosis not present

## 2019-02-20 DIAGNOSIS — R0602 Shortness of breath: Secondary | ICD-10-CM | POA: Diagnosis not present

## 2019-02-20 DIAGNOSIS — J9601 Acute respiratory failure with hypoxia: Secondary | ICD-10-CM | POA: Diagnosis not present

## 2019-02-20 DIAGNOSIS — I272 Pulmonary hypertension, unspecified: Secondary | ICD-10-CM | POA: Diagnosis not present

## 2019-02-20 DIAGNOSIS — J189 Pneumonia, unspecified organism: Secondary | ICD-10-CM | POA: Insufficient documentation

## 2019-02-20 DIAGNOSIS — I519 Heart disease, unspecified: Secondary | ICD-10-CM | POA: Diagnosis not present

## 2019-02-20 DIAGNOSIS — I482 Chronic atrial fibrillation, unspecified: Secondary | ICD-10-CM | POA: Diagnosis not present

## 2019-02-20 DIAGNOSIS — M800AXD Age-related osteoporosis with current pathological fracture, other site, subsequent encounter for fracture with routine healing: Secondary | ICD-10-CM | POA: Diagnosis not present

## 2019-02-20 DIAGNOSIS — I361 Nonrheumatic tricuspid (valve) insufficiency: Secondary | ICD-10-CM | POA: Diagnosis not present

## 2019-02-20 DIAGNOSIS — G8911 Acute pain due to trauma: Secondary | ICD-10-CM | POA: Diagnosis not present

## 2019-02-20 DIAGNOSIS — M8580 Other specified disorders of bone density and structure, unspecified site: Secondary | ICD-10-CM | POA: Diagnosis not present

## 2019-02-20 DIAGNOSIS — W19XXXD Unspecified fall, subsequent encounter: Secondary | ICD-10-CM | POA: Diagnosis not present

## 2019-02-20 DIAGNOSIS — I340 Nonrheumatic mitral (valve) insufficiency: Secondary | ICD-10-CM | POA: Diagnosis not present

## 2019-02-20 DIAGNOSIS — I083 Combined rheumatic disorders of mitral, aortic and tricuspid valves: Secondary | ICD-10-CM | POA: Diagnosis not present

## 2019-02-20 DIAGNOSIS — S32512A Fracture of superior rim of left pubis, initial encounter for closed fracture: Secondary | ICD-10-CM | POA: Diagnosis not present

## 2019-02-20 DIAGNOSIS — I4891 Unspecified atrial fibrillation: Secondary | ICD-10-CM | POA: Diagnosis not present

## 2019-02-20 DIAGNOSIS — S32502A Unspecified fracture of left pubis, initial encounter for closed fracture: Secondary | ICD-10-CM | POA: Insufficient documentation

## 2019-02-20 DIAGNOSIS — W19XXXA Unspecified fall, initial encounter: Secondary | ICD-10-CM | POA: Insufficient documentation

## 2019-02-20 DIAGNOSIS — Z96653 Presence of artificial knee joint, bilateral: Secondary | ICD-10-CM | POA: Diagnosis not present

## 2019-02-20 DIAGNOSIS — I517 Cardiomegaly: Secondary | ICD-10-CM | POA: Diagnosis not present

## 2019-02-20 DIAGNOSIS — Z7982 Long term (current) use of aspirin: Secondary | ICD-10-CM | POA: Diagnosis not present

## 2019-02-20 DIAGNOSIS — R000 Tachycardia, unspecified: Secondary | ICD-10-CM | POA: Diagnosis not present

## 2019-02-20 DIAGNOSIS — I110 Hypertensive heart disease with heart failure: Secondary | ICD-10-CM | POA: Diagnosis not present

## 2019-02-20 DIAGNOSIS — Z043 Encounter for examination and observation following other accident: Secondary | ICD-10-CM | POA: Diagnosis not present

## 2019-02-20 DIAGNOSIS — Z79899 Other long term (current) drug therapy: Secondary | ICD-10-CM | POA: Diagnosis not present

## 2019-02-20 DIAGNOSIS — G935 Compression of brain: Secondary | ICD-10-CM | POA: Diagnosis not present

## 2019-02-20 DIAGNOSIS — S3210XA Unspecified fracture of sacrum, initial encounter for closed fracture: Secondary | ICD-10-CM | POA: Diagnosis not present

## 2019-02-25 DIAGNOSIS — Z853 Personal history of malignant neoplasm of breast: Secondary | ICD-10-CM | POA: Diagnosis not present

## 2019-02-25 DIAGNOSIS — Z7982 Long term (current) use of aspirin: Secondary | ICD-10-CM | POA: Diagnosis not present

## 2019-02-25 DIAGNOSIS — S62102D Fracture of unspecified carpal bone, left wrist, subsequent encounter for fracture with routine healing: Secondary | ICD-10-CM | POA: Diagnosis not present

## 2019-02-25 DIAGNOSIS — M25532 Pain in left wrist: Secondary | ICD-10-CM | POA: Diagnosis not present

## 2019-02-25 DIAGNOSIS — K449 Diaphragmatic hernia without obstruction or gangrene: Secondary | ICD-10-CM | POA: Diagnosis not present

## 2019-02-25 DIAGNOSIS — S329XXD Fracture of unspecified parts of lumbosacral spine and pelvis, subsequent encounter for fracture with routine healing: Secondary | ICD-10-CM | POA: Diagnosis not present

## 2019-02-25 DIAGNOSIS — Z96653 Presence of artificial knee joint, bilateral: Secondary | ICD-10-CM | POA: Diagnosis not present

## 2019-02-25 DIAGNOSIS — Z9181 History of falling: Secondary | ICD-10-CM | POA: Diagnosis not present

## 2019-02-26 ENCOUNTER — Telehealth: Payer: Self-pay | Admitting: *Deleted

## 2019-02-26 NOTE — Telephone Encounter (Signed)
Rec'd msg on Patient [name] stating pt was admitted to [facility] 02/20/19 for  Pneumonia, unspecified organism [id number]); Tachycardia, unspecified (R000); Unspecified fall, initial encounter [id number]); Unspecified injury of right shoulder and upper arm, initial encounter [id number]); Unspecified atrial fibrillation [id number]); Essential (primary) hypertension (I10); Nonrheumatic mitral (valve) insufficiency (I340). Pt D/C 02/22/19. Will call pt to f/u and make appt if need.Marland KitchenJohny Chess

## 2019-02-26 NOTE — Telephone Encounter (Signed)
Called pt verified info tht was in patient Ping. Pt states yes she was discharge on Sunday. Completed TCM call below.Johny Chess  Transition Care Management Follow-up Telephone Call   Date discharged? 02/24/19   How have you been since you were released from the hospital? Pt states she is doing some what better   Do you understand why you were in the hospital? YES   Do you understand the discharge instructions? YES   Where were you discharged to? HOME   Items Reviewed:  Medications reviewed: YES. Pt states they change her BP medication, and can't recall what they change it too. Her niece handles her mediation. She was not there to go over. Inform pt to bring medications when she come to the appt so we are able to updated her record.   Allergies reviewed: YES  Dietary changes reviewed: YES  Referrals reviewed: Per pt no referral was needed   Functional Questionnaire:   Activities of Daily Living (ADLs):   She states she are independent in the following: bathing and hygiene, feeding, continence, grooming, toileting and dressing States they require assistance with the following: ambulation pt states she is moving a little slow, but is able to do ADL's. Pt uses walker to help w/balance   Any transportation issues/concerns?: NO   Any patient concerns? NO   Confirmed importance and date/time of follow-up visits scheduled YES, appt 03/12/19  Provider Appointment booked with Dr. Quay Burow  Confirmed with patient if condition begins to worsen call PCP or go to the ER.  Patient was given the office number and encouraged to call back with question or concerns.  : YES

## 2019-02-27 DIAGNOSIS — W19XXXA Unspecified fall, initial encounter: Secondary | ICD-10-CM | POA: Diagnosis not present

## 2019-02-27 DIAGNOSIS — R5381 Other malaise: Secondary | ICD-10-CM | POA: Diagnosis not present

## 2019-02-28 DIAGNOSIS — S62102D Fracture of unspecified carpal bone, left wrist, subsequent encounter for fracture with routine healing: Secondary | ICD-10-CM | POA: Diagnosis not present

## 2019-02-28 DIAGNOSIS — Z7982 Long term (current) use of aspirin: Secondary | ICD-10-CM | POA: Diagnosis not present

## 2019-02-28 DIAGNOSIS — K449 Diaphragmatic hernia without obstruction or gangrene: Secondary | ICD-10-CM | POA: Diagnosis not present

## 2019-02-28 DIAGNOSIS — M25532 Pain in left wrist: Secondary | ICD-10-CM | POA: Diagnosis not present

## 2019-02-28 DIAGNOSIS — Z96653 Presence of artificial knee joint, bilateral: Secondary | ICD-10-CM | POA: Diagnosis not present

## 2019-02-28 DIAGNOSIS — Z853 Personal history of malignant neoplasm of breast: Secondary | ICD-10-CM | POA: Diagnosis not present

## 2019-02-28 DIAGNOSIS — S329XXD Fracture of unspecified parts of lumbosacral spine and pelvis, subsequent encounter for fracture with routine healing: Secondary | ICD-10-CM | POA: Diagnosis not present

## 2019-02-28 DIAGNOSIS — Z9181 History of falling: Secondary | ICD-10-CM | POA: Diagnosis not present

## 2019-03-04 ENCOUNTER — Telehealth: Payer: Self-pay | Admitting: Internal Medicine

## 2019-03-04 DIAGNOSIS — S329XXD Fracture of unspecified parts of lumbosacral spine and pelvis, subsequent encounter for fracture with routine healing: Secondary | ICD-10-CM | POA: Diagnosis not present

## 2019-03-04 DIAGNOSIS — Z7982 Long term (current) use of aspirin: Secondary | ICD-10-CM | POA: Diagnosis not present

## 2019-03-04 DIAGNOSIS — Z96653 Presence of artificial knee joint, bilateral: Secondary | ICD-10-CM | POA: Diagnosis not present

## 2019-03-04 DIAGNOSIS — S62102D Fracture of unspecified carpal bone, left wrist, subsequent encounter for fracture with routine healing: Secondary | ICD-10-CM | POA: Diagnosis not present

## 2019-03-04 DIAGNOSIS — M25532 Pain in left wrist: Secondary | ICD-10-CM | POA: Diagnosis not present

## 2019-03-04 DIAGNOSIS — Z853 Personal history of malignant neoplasm of breast: Secondary | ICD-10-CM | POA: Diagnosis not present

## 2019-03-04 DIAGNOSIS — Z9181 History of falling: Secondary | ICD-10-CM | POA: Diagnosis not present

## 2019-03-04 DIAGNOSIS — K449 Diaphragmatic hernia without obstruction or gangrene: Secondary | ICD-10-CM | POA: Diagnosis not present

## 2019-03-04 NOTE — Telephone Encounter (Signed)
I can move pt to tomorrow if this is something you feel will be ok to wait on until then. Please advise.

## 2019-03-04 NOTE — Telephone Encounter (Signed)
Talked to niece. Moved appointment to tomorrow.

## 2019-03-04 NOTE — Telephone Encounter (Signed)
Pt has really bad swelling of both of her ankles and legs/ Pt has moved up her hospital f/u to Friday 11.20.20/ Pt wants to know if she should be seen even sooner or anything she should do. Sheppard Evens  states swelling has been going on since she started meds and was discharged from the hospital / please advise

## 2019-03-04 NOTE — Telephone Encounter (Signed)
Move to tomorrow - she should bring a list of medications she is taking or the bottles.

## 2019-03-05 ENCOUNTER — Ambulatory Visit (INDEPENDENT_AMBULATORY_CARE_PROVIDER_SITE_OTHER): Payer: Medicare Other | Admitting: Internal Medicine

## 2019-03-05 ENCOUNTER — Other Ambulatory Visit (INDEPENDENT_AMBULATORY_CARE_PROVIDER_SITE_OTHER): Payer: Medicare Other

## 2019-03-05 ENCOUNTER — Other Ambulatory Visit: Payer: Self-pay | Admitting: Internal Medicine

## 2019-03-05 ENCOUNTER — Ambulatory Visit (INDEPENDENT_AMBULATORY_CARE_PROVIDER_SITE_OTHER)
Admission: RE | Admit: 2019-03-05 | Discharge: 2019-03-05 | Disposition: A | Discharge status: Home / Self Care | Payer: Medicare Other | Source: Ambulatory Visit | Attending: Internal Medicine | Admitting: Internal Medicine

## 2019-03-05 ENCOUNTER — Encounter: Payer: Self-pay | Admitting: Internal Medicine

## 2019-03-05 ENCOUNTER — Other Ambulatory Visit: Payer: Self-pay

## 2019-03-05 VITALS — BP 154/86 | HR 79 | Temp 98.4°F | Resp 16 | Ht 66.0 in | Wt 155.8 lb

## 2019-03-05 DIAGNOSIS — I4891 Unspecified atrial fibrillation: Secondary | ICD-10-CM | POA: Diagnosis not present

## 2019-03-05 DIAGNOSIS — I10 Essential (primary) hypertension: Secondary | ICD-10-CM | POA: Diagnosis not present

## 2019-03-05 DIAGNOSIS — S32502A Unspecified fracture of left pubis, initial encounter for closed fracture: Secondary | ICD-10-CM

## 2019-03-05 DIAGNOSIS — I5033 Acute on chronic diastolic (congestive) heart failure: Secondary | ICD-10-CM

## 2019-03-05 DIAGNOSIS — J189 Pneumonia, unspecified organism: Secondary | ICD-10-CM | POA: Diagnosis not present

## 2019-03-05 DIAGNOSIS — I509 Heart failure, unspecified: Secondary | ICD-10-CM | POA: Diagnosis not present

## 2019-03-05 DIAGNOSIS — I5030 Unspecified diastolic (congestive) heart failure: Secondary | ICD-10-CM | POA: Insufficient documentation

## 2019-03-05 DIAGNOSIS — F329 Major depressive disorder, single episode, unspecified: Secondary | ICD-10-CM

## 2019-03-05 DIAGNOSIS — H401133 Primary open-angle glaucoma, bilateral, severe stage: Secondary | ICD-10-CM | POA: Diagnosis not present

## 2019-03-05 DIAGNOSIS — F419 Anxiety disorder, unspecified: Secondary | ICD-10-CM

## 2019-03-05 LAB — CBC WITH DIFFERENTIAL/PLATELET
Basophils Absolute: 0.1 10*3/uL (ref 0.0–0.1)
Basophils Relative: 0.9 % (ref 0.0–3.0)
Eosinophils Absolute: 0.1 10*3/uL (ref 0.0–0.7)
Eosinophils Relative: 0.6 % (ref 0.0–5.0)
HCT: 41.2 % (ref 36.0–46.0)
Hemoglobin: 13.4 g/dL (ref 12.0–15.0)
Lymphocytes Relative: 14.4 % (ref 12.0–46.0)
Lymphs Abs: 1.1 10*3/uL (ref 0.7–4.0)
MCHC: 32.7 g/dL (ref 30.0–36.0)
MCV: 97.5 fl (ref 78.0–100.0)
Monocytes Absolute: 0.4 10*3/uL (ref 0.1–1.0)
Monocytes Relative: 5.8 % (ref 3.0–12.0)
Neutro Abs: 6.1 10*3/uL (ref 1.4–7.7)
Neutrophils Relative %: 78.3 % — ABNORMAL HIGH (ref 43.0–77.0)
Platelets: 254 10*3/uL (ref 150.0–400.0)
RBC: 4.22 Mil/uL (ref 3.87–5.11)
RDW: 14.9 % (ref 11.5–15.5)
WBC: 7.8 10*3/uL (ref 4.0–10.5)

## 2019-03-05 LAB — COMPREHENSIVE METABOLIC PANEL
ALT: 9 U/L (ref 0–35)
AST: 17 U/L (ref 0–37)
Albumin: 3.8 g/dL (ref 3.5–5.2)
Alkaline Phosphatase: 122 U/L — ABNORMAL HIGH (ref 39–117)
BUN: 17 mg/dL (ref 6–23)
CO2: 26 mEq/L (ref 19–32)
Calcium: 9.2 mg/dL (ref 8.4–10.5)
Chloride: 105 mEq/L (ref 96–112)
Creatinine, Ser: 0.75 mg/dL (ref 0.40–1.20)
GFR: 73.25 mL/min (ref >60.00)
Glucose, Bld: 126 mg/dL — ABNORMAL HIGH (ref 70–99)
Potassium: 3.2 mEq/L — ABNORMAL LOW (ref 3.5–5.1)
Sodium: 142 mEq/L (ref 135–145)
Total Bilirubin: 1.1 mg/dL (ref 0.2–1.2)
Total Protein: 7.1 g/dL (ref 6.0–8.3)

## 2019-03-05 LAB — BRAIN NATRIURETIC PEPTIDE: Pro B Natriuretic peptide (BNP): 570 pg/mL — ABNORMAL HIGH (ref 0.0–100.0)

## 2019-03-05 MED ORDER — POTASSIUM CHLORIDE CRYS ER 20 MEQ PO TBCR: 20.0000 meq | EXTENDED_RELEASE_TABLET | Freq: Every day | ORAL | 1 refills | Status: DC

## 2019-03-05 MED ORDER — FUROSEMIDE 20 MG PO TABS: 20.0000 mg | ORAL_TABLET | Freq: Every day | ORAL | 3 refills | Status: DC

## 2019-03-05 NOTE — Progress Notes (Signed)
Subjective:    Patient ID: [name], female    DOB: 13-Jan-1931, 85 y.o.   MRN: [id number]  HPI The patient is here for follow up from the hospital.  Admitted 11/4 - 11/6 for acute hypoxic resp failure secondary b/l LE edema secondary to grade 2 diastolic CHF exacerbation, probable Afib with RVR with severe LAE and moderate MR.    She went to the ED after a fall at her.  She was complaining of right shoulder pain and right flank pain.  She was found to have acute respiratory failure, b/l LE edema, CHF exacerbation and Afib.  She was also found to have pelvic fractures, which were related to a fall 8 weeks ago.  She had gone to the emergency room at that time.  She is here today with her niece, [name].  Acute hypoxic respiratory failure with probable developing PNA: Oxygen sat 88% on RA, required 2 L O2  Ct Anggio of chest - no PE, b/l pleural effusions, patchy groundglass infiltrate/ edema Neg flu, neg RSV, neg COVID Initially received Rocephin - then Doxycycline x 7 days She feels SOB with exertion  - it is the same since leaving the hospital She denies any fevers, chills, coughing or wheezing.  Afib with RVR, Severe LAE, moderate MR tsh normal Echo:  EF [id number], grade 2 diastolic dysfunction, mod MR She has worsen edema since discharge She has occasional palpitations.  She continues to have shortness of breath with exertion.  She denies any chest pain, headaches or lightheadedness. She does not have a follow-up appointment with cardiology  Fall with closed subacute fx of left pubis rami and sacrum, underlying osteopenia: Ortho consulted fx not from this fall, but from fall 8 weeks ago PT, OT Imaging done - xray of R humerus, Ct head and c-spine, left pubis rami and sacrum, Ct Ab/pelvis, CXR Weight bearing as tolerated F/u ortho as outpatient - she had an appt but it had to be rescheduled.  Taking tylenol as needed for pain  Mild anxiety, depression: Controlled with  zoloft  New medications:  Eliquis, losartan, metoprolol  She is not eating much or drinking much.  She states she does not have much of an appetite.  She has not seen much improvement since leaving the hospital.  This actually started after her first fall couple of months ago.  She does feel depressed at times.  She states feeling this since her fall a couple months ago.  Medications and allergies reviewed with patient and updated if appropriate.  Patient Active Problem List   Diagnosis Date Noted  . Diastolic heart failure ([place]) 03/05/2019  . Atrial fibrillation with RVR ([place]) 02/20/2019  . Pneumonia of both lower lobes due to infectious organism 02/20/2019  . Unsp fracture of left pubis, init encntr for closed fracture ([place]) 02/20/2019  . Fatigue 06/27/2018  . Left rotator cuff tear 02/01/2018  . Insomnia 08/30/2017  . Adjustment disorder with mixed anxiety and depressed mood 08/30/2017  . Prediabetes 09/18/2016  . Grief 04/20/2016  . Thyroid nodule 01/19/2016  . Multiple falls 01/20/2015  . Hx MRSA infection 03/05/2013  . Lap Repair Large Hiatal Hernia with Nissen May 2014 09/20/2012  . Cough 09/13/2012  . Large type III mixed hiatus hernia 08/16/2012  . Carotid arterial disease ([place]) 10/31/2011  . OTH SPEC ALVEOL&PARIETOALVEOL PNEUMONOPATHIES 04/28/2010  . HEMORRHOIDS-INTERNAL 03/01/2010  . GASTRIC OUTLET OBSTRUCTION 02/10/2010  . RHEUMATIC HEART DISEASE 02/02/2010  . ESOPHAGEAL STRICTURE 02/02/2010  . GERD  02/02/2010  . DUODENAL ULCER 02/02/2010  . HIATAL HERNIA 02/02/2010  . DIVERTICULOSIS, COLON 02/02/2010  . OSTEOARTHRITIS 02/02/2010  . Acute pain of left shoulder 10/09/2009  . DECREASED HEARING, BILATERAL 02/06/2008  . Dizziness 06/28/2007  . PEPTIC ULCER DISEASE 06/07/2007  . Hyperlipidemia 12/19/2006  . Anxiety and depression 12/19/2006  . COPD 12/19/2006  . GLAUCOMA 09/04/2006  . HTN (hypertension) 09/04/2006  . Osteoporosis 09/04/2006  . PREMATURE  VENTRICULAR CONTRACTIONS 10/06/1994  . MASTECTOMY, RIGHT, HX OF 04/18/1993    Current Outpatient Medications on File Prior to Visit  Medication Sig Dispense Refill  . aspirin 81 MG tablet Take 81 mg by mouth daily.      [name] azelastine (ASTELIN) 137 MCG/SPRAY nasal spray Place 1 spray into the nose 2 (two) times daily as needed (for allergies). Reported on 07/20/2015    . Biotin (BIOTIN ULTRA STRENGTH) 10 MG CAPS Take 1 capsule by mouth daily.    [name] ELIQUIS 5 MG TABS tablet Take 5 mg by mouth 2 (two) times daily.    [name] LORazepam (ATIVAN) 2 MG tablet TAKE 1/2 TABLET BY MOUTH EVERY 8 HOURS AS NEEDED FOR ANXIETY 60 tablet 0  . losartan (COZAAR) 50 MG tablet Take 50 mg by mouth daily.    . metoprolol tartrate (LOPRESSOR) 25 MG tablet Take 25 mg by mouth 2 (two) times daily.    . pantoprazole (PROTONIX) 40 MG tablet Take 1 tablet (40 mg total) by mouth daily. Need office visit for more refills. 30 tablet 0  . polyethylene glycol (MIRALAX / GLYCOLAX) packet Take 17 g by mouth daily.    . sertraline (ZOLOFT) 100 MG tablet Take 1 tablet (100 mg total) by mouth daily. Need office visit for more refills. 90 tablet 0  . vitamin B-12 (CYANOCOBALAMIN) 1000 MCG tablet Take 1,000 mcg by mouth daily.       No current facility-administered medications on file prior to visit.     Past Medical History:  Diagnosis Date  . Anxiety   . Breast cancer ([place]) 13 years ago   s/p tamoxifen   . COPD (chronic obstructive pulmonary disease) ([place])   . Depression   . Diverticulosis   . Esophageal stricture   . Gastric outlet obstruction   . GERD (gastroesophageal reflux disease)   . Glaucoma   . Hiatal hernia   . Hiatal hernia   . Hyperlipidemia   . Hypertension   . Insomnia   . Internal hemorrhoids   . Osteoarthritis   . Osteoporosis   . Peptic ulcer disease   . Rheumatic heart disease   . Ulcerative colitis     Past Surgical History:  Procedure Laterality Date  . ABDOMINAL HYSTERECTOMY    . BREAST  RECONSTRUCTION  2000   right  . BREAST SURGERY     right  . CATARACT EXTRACTION  08/2006 and 10/2006  . HEMORRHOID SURGERY    . HIATAL HERNIA REPAIR N/A 09/03/2012   Procedure: LAPAROSCOPIC REPAIR OF HIATAL HERNIA;  Surgeon: Pedro Earls, MD;  Location: [facility];  Service: General;  Laterality: N/A;  . JOINT REPLACEMENT     right  . LAPAROSCOPIC NISSEN FUNDOPLICATION N/A [id number]   Procedure: LAP REPAIR OF LARGE TYPE III MIXED HIATUS HERNIA WITH NISSEN FUNOPLICATION;  Surgeon: Pedro Earls, MD;  Location: [facility];  Service: General;  Laterality: N/A;  . [place]   right  . TONSILLECTOMY    . TOTAL KNEE ARTHROPLASTY     left  Social History   Socioeconomic History  . Marital status: Widowed    Spouse name: Not on file  . Number of children: 3  . Years of education: Not on file  . Highest education level: Not on file  Occupational History  . Occupation: retired-sales    Employer: RETIRED  Social Needs  . Financial resource strain: Not hard at all  . Food insecurity    Worry: Never true    Inability: Never true  . Transportation needs    Medical: No    Non-medical: No  Tobacco Use  . Smoking status: Never Smoker  . Smokeless tobacco: Never Used  Substance and Sexual Activity  . Alcohol use: No    Alcohol/week: 0.0 standard drinks  . Drug use: No  . Sexual activity: Never  Lifestyle  . Physical activity    Days per week: 0 days    Minutes per session: 0 min  . Stress: To some extent  Relationships  . Social connections    Talks on phone: More than three times a week    Gets together: More than three times a week    Attends religious service: More than 4 times per year    Active member of club or organization: Yes    Attends meetings of clubs or organizations: More than 4 times per year    Relationship status: Widowed  Other Topics Concern  . Not on file  Social History Narrative  . Not on file    Family History  Problem Relation Age of Onset   . Coronary artery disease Sister        x 2 in 3's  . Coronary artery disease Brother        x 2 in 70's  . Diabetes Unknown   . Colon cancer Father 53  . Hypertension Unknown   . Stroke Mother 56  . Crohn's disease Brother   . Breast cancer Unknown        Aunt  . Cirrhosis Brother   . Liver disease Son     Review of Systems  Constitutional: Negative for chills and fever.  Respiratory: Positive for shortness of breath (with exertion only). Negative for cough and wheezing.   Cardiovascular: Positive for palpitations (intermittent) and leg swelling. Negative for chest pain.  Neurological: Positive for dizziness (improved with metoprolol). Negative for light-headedness and headaches.       Objective:   Vitals:   03/05/19 1041  BP: (!) 154/86  Pulse: 79  Resp: 16  Temp: 98.4 F (36.9 C)  SpO2: 97%   BP Readings from Last 3 Encounters:  03/05/19 (!) 154/86  06/27/18 (!) 148/96  03/21/18 138/82   Wt Readings from Last 3 Encounters:  03/05/19 155 lb 12.8 oz (70.7 kg)  06/27/18 165 lb (74.8 kg)  03/21/18 165 lb (74.8 kg)   Body mass index is 25.15 kg/m.   Physical Exam    Constitutional: Appears well-developed and well-nourished. No distress.  HENT:  Head: Normocephalic and atraumatic.  Neck: Neck supple. No tracheal deviation present. No thyromegaly present.  No cervical lymphadenopathy Cardiovascular: Normal rate, irregular rhythm and normal heart sounds.   2/6 systolic murmur heard. No carotid bruit .  1+ bilateral lower extremity edema Pulmonary/Chest: Effort normal.  No wheeze.  By lateral crackles most of the way up the lungs. No respiratory distress.  Skin: Skin is warm and dry. Not diaphoretic.  Psychiatric: Normal mood and affect. Behavior is normal.      Assessment &  Plan:    See Problem List for Assessment and Plan of chronic medical problems.

## 2019-03-05 NOTE — Assessment & Plan Note (Signed)
She has had some depression recently related to recent medical problems Continue sertraline at current dose-can increase in the near future if needed Continue lorazepam

## 2019-03-05 NOTE — Patient Instructions (Addendum)
Have blood work and a chest x-ray today.  Tests ordered today. Your results will be released to [facility] (or called to you) after review.  If any changes need to be made, you will be notified at that same time.    Have your BP and heart rate checked a few times a week and keep a log.    Medications reviewed and updated.  Changes include :   Start lasix 20 mg daily in the morning (water pill)  Your prescription(s) have been submitted to your pharmacy. Please take as directed and contact our office if you believe you are having problem(s) with the medication(s).  A referral was ordered for cardiology.  Please followup in 3 months

## 2019-03-05 NOTE — Assessment & Plan Note (Signed)
Completed antibiotics while in the hospital No cough, wheeze or fevers Has some shortness of breath, which may be related to A. fib and acute on chronic diastolic heart failure We will be getting checks x-ray to evaluate for pulmonary edema

## 2019-03-05 NOTE — Assessment & Plan Note (Signed)
Acute on chronic exacerbation She is currently fluid overloaded, possibly related to her being in A. fib Referral ordered for cardiology Continue current medications, add Lasix 20 mg daily and potassium CBC, CMP, BNP, chest x-ray  Advised for them to update me with her progress so that we can further adjust medications

## 2019-03-05 NOTE — Assessment & Plan Note (Signed)
Blood pressure elevated here today, but per patient blood pressure has been in the 120s-130s when it has been checked where she lives We will continue losartan and metoprolol at current dose for now Will be adding Lasix for fluid overload Advised to start having her blood pressure and heart rate checked at least 3 times a week where she lives and keep a log

## 2019-03-05 NOTE — Assessment & Plan Note (Signed)
Has follow-up appointment scheduled with orthopedics Pain controlled with Tylenol Ambulating Doing PT

## 2019-03-05 NOTE — Assessment & Plan Note (Addendum)
She is in atrial fibrillation here today, rate controlled She feels palpitations on occasion, but denies feeling them now She is experiencing shortness of breath, leg edema Referral ordered for cardiology Advised to have her blood pressure and heart rate checked where she lives at least 3 times a week and keep a log Continue metoprolol at current dose for now, losartan at current dose, Eliquis CBC, CMP, BNP Add Lasix 20 mg daily because she is fluid overloaded Discussed that she will need further medication adjustment depending on blood pressure, heart rate

## 2019-03-07 DIAGNOSIS — Z7982 Long term (current) use of aspirin: Secondary | ICD-10-CM | POA: Diagnosis not present

## 2019-03-07 DIAGNOSIS — S329XXD Fracture of unspecified parts of lumbosacral spine and pelvis, subsequent encounter for fracture with routine healing: Secondary | ICD-10-CM | POA: Diagnosis not present

## 2019-03-07 DIAGNOSIS — S62102D Fracture of unspecified carpal bone, left wrist, subsequent encounter for fracture with routine healing: Secondary | ICD-10-CM | POA: Diagnosis not present

## 2019-03-07 DIAGNOSIS — Z96653 Presence of artificial knee joint, bilateral: Secondary | ICD-10-CM | POA: Diagnosis not present

## 2019-03-07 DIAGNOSIS — K449 Diaphragmatic hernia without obstruction or gangrene: Secondary | ICD-10-CM | POA: Diagnosis not present

## 2019-03-07 DIAGNOSIS — M25532 Pain in left wrist: Secondary | ICD-10-CM | POA: Diagnosis not present

## 2019-03-07 DIAGNOSIS — Z853 Personal history of malignant neoplasm of breast: Secondary | ICD-10-CM | POA: Diagnosis not present

## 2019-03-07 DIAGNOSIS — Z9181 History of falling: Secondary | ICD-10-CM | POA: Diagnosis not present

## 2019-03-08 ENCOUNTER — Inpatient Hospital Stay: Payer: Medicare Other | Admitting: Internal Medicine

## 2019-03-10 DIAGNOSIS — S2231XA Fracture of one rib, right side, initial encounter for closed fracture: Secondary | ICD-10-CM | POA: Diagnosis not present

## 2019-03-10 DIAGNOSIS — S22040A Wedge compression fracture of fourth thoracic vertebra, initial encounter for closed fracture: Secondary | ICD-10-CM | POA: Diagnosis not present

## 2019-03-10 DIAGNOSIS — R0902 Hypoxemia: Secondary | ICD-10-CM | POA: Diagnosis not present

## 2019-03-10 DIAGNOSIS — R000 Tachycardia, unspecified: Secondary | ICD-10-CM | POA: Diagnosis not present

## 2019-03-10 DIAGNOSIS — J90 Pleural effusion, not elsewhere classified: Secondary | ICD-10-CM | POA: Diagnosis not present

## 2019-03-10 DIAGNOSIS — S22050A Wedge compression fracture of T5-T6 vertebra, initial encounter for closed fracture: Secondary | ICD-10-CM | POA: Diagnosis not present

## 2019-03-10 DIAGNOSIS — J9601 Acute respiratory failure with hypoxia: Secondary | ICD-10-CM | POA: Diagnosis not present

## 2019-03-10 DIAGNOSIS — Z79899 Other long term (current) drug therapy: Secondary | ICD-10-CM | POA: Diagnosis not present

## 2019-03-10 DIAGNOSIS — Z888 Allergy status to other drugs, medicaments and biological substances status: Secondary | ICD-10-CM | POA: Diagnosis not present

## 2019-03-10 DIAGNOSIS — Z885 Allergy status to narcotic agent status: Secondary | ICD-10-CM | POA: Diagnosis not present

## 2019-03-10 DIAGNOSIS — I10 Essential (primary) hypertension: Secondary | ICD-10-CM | POA: Diagnosis not present

## 2019-03-10 DIAGNOSIS — Z7982 Long term (current) use of aspirin: Secondary | ICD-10-CM | POA: Diagnosis not present

## 2019-03-10 DIAGNOSIS — Z853 Personal history of malignant neoplasm of breast: Secondary | ICD-10-CM | POA: Diagnosis not present

## 2019-03-10 DIAGNOSIS — J189 Pneumonia, unspecified organism: Secondary | ICD-10-CM | POA: Diagnosis not present

## 2019-03-10 DIAGNOSIS — M542 Cervicalgia: Secondary | ICD-10-CM | POA: Diagnosis not present

## 2019-03-10 DIAGNOSIS — Z743 Need for continuous supervision: Secondary | ICD-10-CM | POA: Diagnosis not present

## 2019-03-10 DIAGNOSIS — I4891 Unspecified atrial fibrillation: Secondary | ICD-10-CM | POA: Diagnosis not present

## 2019-03-10 DIAGNOSIS — R519 Headache, unspecified: Secondary | ICD-10-CM | POA: Diagnosis not present

## 2019-03-10 DIAGNOSIS — M5489 Other dorsalgia: Secondary | ICD-10-CM | POA: Diagnosis not present

## 2019-03-11 ENCOUNTER — Telehealth: Payer: Self-pay | Admitting: Internal Medicine

## 2019-03-11 DIAGNOSIS — I482 Chronic atrial fibrillation, unspecified: Secondary | ICD-10-CM | POA: Diagnosis not present

## 2019-03-11 DIAGNOSIS — D62 Acute posthemorrhagic anemia: Secondary | ICD-10-CM | POA: Diagnosis not present

## 2019-03-11 DIAGNOSIS — S22049A Unspecified fracture of fourth thoracic vertebra, initial encounter for closed fracture: Secondary | ICD-10-CM | POA: Diagnosis not present

## 2019-03-11 DIAGNOSIS — K5900 Constipation, unspecified: Secondary | ICD-10-CM | POA: Diagnosis not present

## 2019-03-11 DIAGNOSIS — S22048A Other fracture of fourth thoracic vertebra, initial encounter for closed fracture: Secondary | ICD-10-CM | POA: Diagnosis not present

## 2019-03-11 DIAGNOSIS — R918 Other nonspecific abnormal finding of lung field: Secondary | ICD-10-CM | POA: Diagnosis not present

## 2019-03-11 DIAGNOSIS — I10 Essential (primary) hypertension: Secondary | ICD-10-CM | POA: Diagnosis not present

## 2019-03-11 DIAGNOSIS — S22050A Wedge compression fracture of T5-T6 vertebra, initial encounter for closed fracture: Secondary | ICD-10-CM | POA: Diagnosis not present

## 2019-03-11 DIAGNOSIS — W1830XA Fall on same level, unspecified, initial encounter: Secondary | ICD-10-CM | POA: Diagnosis not present

## 2019-03-11 DIAGNOSIS — W19XXXA Unspecified fall, initial encounter: Secondary | ICD-10-CM | POA: Diagnosis not present

## 2019-03-11 DIAGNOSIS — S299XXA Unspecified injury of thorax, initial encounter: Secondary | ICD-10-CM | POA: Diagnosis not present

## 2019-03-11 DIAGNOSIS — S22059A Unspecified fracture of T5-T6 vertebra, initial encounter for closed fracture: Secondary | ICD-10-CM | POA: Diagnosis not present

## 2019-03-11 DIAGNOSIS — Z7901 Long term (current) use of anticoagulants: Secondary | ICD-10-CM | POA: Diagnosis not present

## 2019-03-11 DIAGNOSIS — J449 Chronic obstructive pulmonary disease, unspecified: Secondary | ICD-10-CM | POA: Diagnosis not present

## 2019-03-11 DIAGNOSIS — E869 Volume depletion, unspecified: Secondary | ICD-10-CM | POA: Diagnosis not present

## 2019-03-11 DIAGNOSIS — S22040A Wedge compression fracture of fourth thoracic vertebra, initial encounter for closed fracture: Secondary | ICD-10-CM | POA: Diagnosis not present

## 2019-03-11 DIAGNOSIS — I340 Nonrheumatic mitral (valve) insufficiency: Secondary | ICD-10-CM | POA: Diagnosis not present

## 2019-03-11 DIAGNOSIS — I4891 Unspecified atrial fibrillation: Secondary | ICD-10-CM | POA: Diagnosis not present

## 2019-03-11 DIAGNOSIS — I509 Heart failure, unspecified: Secondary | ICD-10-CM | POA: Diagnosis not present

## 2019-03-11 DIAGNOSIS — E876 Hypokalemia: Secondary | ICD-10-CM | POA: Diagnosis not present

## 2019-03-11 DIAGNOSIS — S22058A Other fracture of T5-T6 vertebra, initial encounter for closed fracture: Secondary | ICD-10-CM | POA: Diagnosis not present

## 2019-03-11 DIAGNOSIS — R1310 Dysphagia, unspecified: Secondary | ICD-10-CM | POA: Diagnosis not present

## 2019-03-11 DIAGNOSIS — I5032 Chronic diastolic (congestive) heart failure: Secondary | ICD-10-CM | POA: Diagnosis not present

## 2019-03-11 DIAGNOSIS — J9601 Acute respiratory failure with hypoxia: Secondary | ICD-10-CM | POA: Diagnosis not present

## 2019-03-11 DIAGNOSIS — S3282XA Multiple fractures of pelvis without disruption of pelvic ring, initial encounter for closed fracture: Secondary | ICD-10-CM | POA: Diagnosis not present

## 2019-03-11 DIAGNOSIS — I110 Hypertensive heart disease with heart failure: Secondary | ICD-10-CM | POA: Diagnosis not present

## 2019-03-11 DIAGNOSIS — W19XXXD Unspecified fall, subsequent encounter: Secondary | ICD-10-CM | POA: Diagnosis not present

## 2019-03-11 DIAGNOSIS — E785 Hyperlipidemia, unspecified: Secondary | ICD-10-CM | POA: Diagnosis not present

## 2019-03-11 DIAGNOSIS — S2241XA Multiple fractures of ribs, right side, initial encounter for closed fracture: Secondary | ICD-10-CM | POA: Diagnosis not present

## 2019-03-11 DIAGNOSIS — R279 Unspecified lack of coordination: Secondary | ICD-10-CM | POA: Diagnosis not present

## 2019-03-11 DIAGNOSIS — R109 Unspecified abdominal pain: Secondary | ICD-10-CM | POA: Diagnosis not present

## 2019-03-11 DIAGNOSIS — S42101A Fracture of unspecified part of scapula, right shoulder, initial encounter for closed fracture: Secondary | ICD-10-CM | POA: Diagnosis not present

## 2019-03-11 DIAGNOSIS — Z79899 Other long term (current) drug therapy: Secondary | ICD-10-CM | POA: Diagnosis not present

## 2019-03-11 DIAGNOSIS — S22088A Other fracture of T11-T12 vertebra, initial encounter for closed fracture: Secondary | ICD-10-CM | POA: Diagnosis not present

## 2019-03-11 DIAGNOSIS — E119 Type 2 diabetes mellitus without complications: Secondary | ICD-10-CM | POA: Diagnosis not present

## 2019-03-11 DIAGNOSIS — J440 Chronic obstructive pulmonary disease with acute lower respiratory infection: Secondary | ICD-10-CM | POA: Diagnosis not present

## 2019-03-11 DIAGNOSIS — R413 Other amnesia: Secondary | ICD-10-CM | POA: Diagnosis not present

## 2019-03-11 DIAGNOSIS — J90 Pleural effusion, not elsewhere classified: Secondary | ICD-10-CM | POA: Diagnosis not present

## 2019-03-11 DIAGNOSIS — S22050D Wedge compression fracture of T5-T6 vertebra, subsequent encounter for fracture with routine healing: Secondary | ICD-10-CM | POA: Diagnosis not present

## 2019-03-11 DIAGNOSIS — S2241XD Multiple fractures of ribs, right side, subsequent encounter for fracture with routine healing: Secondary | ICD-10-CM | POA: Diagnosis not present

## 2019-03-11 DIAGNOSIS — M4854XS Collapsed vertebra, not elsewhere classified, thoracic region, sequela of fracture: Secondary | ICD-10-CM | POA: Diagnosis not present

## 2019-03-11 DIAGNOSIS — Y998 Other external cause status: Secondary | ICD-10-CM | POA: Diagnosis not present

## 2019-03-11 DIAGNOSIS — R5381 Other malaise: Secondary | ICD-10-CM | POA: Diagnosis not present

## 2019-03-11 DIAGNOSIS — S42111S Displaced fracture of body of scapula, right shoulder, sequela: Secondary | ICD-10-CM | POA: Diagnosis not present

## 2019-03-11 DIAGNOSIS — Z743 Need for continuous supervision: Secondary | ICD-10-CM | POA: Diagnosis not present

## 2019-03-11 DIAGNOSIS — S42111A Displaced fracture of body of scapula, right shoulder, initial encounter for closed fracture: Secondary | ICD-10-CM | POA: Diagnosis not present

## 2019-03-11 DIAGNOSIS — S22040D Wedge compression fracture of fourth thoracic vertebra, subsequent encounter for fracture with routine healing: Secondary | ICD-10-CM | POA: Diagnosis not present

## 2019-03-11 DIAGNOSIS — S42114A Nondisplaced fracture of body of scapula, right shoulder, initial encounter for closed fracture: Secondary | ICD-10-CM | POA: Diagnosis not present

## 2019-03-11 DIAGNOSIS — Z7982 Long term (current) use of aspirin: Secondary | ICD-10-CM | POA: Diagnosis not present

## 2019-03-11 DIAGNOSIS — S42101D Fracture of unspecified part of scapula, right shoulder, subsequent encounter for fracture with routine healing: Secondary | ICD-10-CM | POA: Diagnosis not present

## 2019-03-11 MED ORDER — SODIUM CHLORIDE 0.9 % IV SOLN
10.00 | INTRAVENOUS | Status: DC
Start: ? — End: 2019-03-11

## 2019-03-11 MED ORDER — GENERIC EXTERNAL MEDICATION
Status: DC
Start: ? — End: 2019-03-11

## 2019-03-11 NOTE — Telephone Encounter (Signed)
Copied from [place] [id number]. Topic: General - Other >> Mar 11, 2019  9:54 AM Nils Flack wrote: Reason for CRM: [facility] called - pt has not been taking her meds regularly. She refuses to follow a med program where someone reminds her to take her meds daily.  Pt fell yesterday afternoon while visiting with her son She is now at [facility] with broken ribs and vertebrae.  [facility] just wants to make sure that you are aware.  Cb if needed is [phone]

## 2019-03-12 ENCOUNTER — Inpatient Hospital Stay: Payer: Medicare Other | Admitting: Internal Medicine

## 2019-03-12 NOTE — Telephone Encounter (Signed)
noted 

## 2019-03-16 ENCOUNTER — Other Ambulatory Visit: Payer: Self-pay | Admitting: Internal Medicine

## 2019-03-16 DIAGNOSIS — S22050D Wedge compression fracture of T5-T6 vertebra, subsequent encounter for fracture with routine healing: Secondary | ICD-10-CM | POA: Diagnosis not present

## 2019-03-16 DIAGNOSIS — M549 Dorsalgia, unspecified: Secondary | ICD-10-CM | POA: Diagnosis not present

## 2019-03-16 DIAGNOSIS — S22040D Wedge compression fracture of fourth thoracic vertebra, subsequent encounter for fracture with routine healing: Secondary | ICD-10-CM | POA: Diagnosis not present

## 2019-03-16 DIAGNOSIS — I10 Essential (primary) hypertension: Secondary | ICD-10-CM | POA: Diagnosis not present

## 2019-03-16 DIAGNOSIS — S2231XA Fracture of one rib, right side, initial encounter for closed fracture: Secondary | ICD-10-CM | POA: Diagnosis not present

## 2019-03-16 DIAGNOSIS — K649 Unspecified hemorrhoids: Secondary | ICD-10-CM | POA: Diagnosis not present

## 2019-03-16 DIAGNOSIS — E785 Hyperlipidemia, unspecified: Secondary | ICD-10-CM | POA: Diagnosis not present

## 2019-03-16 DIAGNOSIS — B379 Candidiasis, unspecified: Secondary | ICD-10-CM | POA: Diagnosis not present

## 2019-03-16 DIAGNOSIS — S2241XA Multiple fractures of ribs, right side, initial encounter for closed fracture: Secondary | ICD-10-CM | POA: Diagnosis not present

## 2019-03-16 DIAGNOSIS — S22000A Wedge compression fracture of unspecified thoracic vertebra, initial encounter for closed fracture: Secondary | ICD-10-CM | POA: Diagnosis not present

## 2019-03-16 DIAGNOSIS — D649 Anemia, unspecified: Secondary | ICD-10-CM | POA: Diagnosis not present

## 2019-03-16 DIAGNOSIS — Z7901 Long term (current) use of anticoagulants: Secondary | ICD-10-CM | POA: Diagnosis not present

## 2019-03-16 DIAGNOSIS — R279 Unspecified lack of coordination: Secondary | ICD-10-CM | POA: Diagnosis not present

## 2019-03-16 DIAGNOSIS — Z743 Need for continuous supervision: Secondary | ICD-10-CM | POA: Diagnosis not present

## 2019-03-16 DIAGNOSIS — S22059A Unspecified fracture of T5-T6 vertebra, initial encounter for closed fracture: Secondary | ICD-10-CM | POA: Diagnosis not present

## 2019-03-16 DIAGNOSIS — J449 Chronic obstructive pulmonary disease, unspecified: Secondary | ICD-10-CM | POA: Diagnosis not present

## 2019-03-16 DIAGNOSIS — S42101D Fracture of unspecified part of scapula, right shoulder, subsequent encounter for fracture with routine healing: Secondary | ICD-10-CM | POA: Diagnosis not present

## 2019-03-16 DIAGNOSIS — W19XXXA Unspecified fall, initial encounter: Secondary | ICD-10-CM | POA: Diagnosis not present

## 2019-03-16 DIAGNOSIS — W19XXXD Unspecified fall, subsequent encounter: Secondary | ICD-10-CM | POA: Diagnosis not present

## 2019-03-16 DIAGNOSIS — K5900 Constipation, unspecified: Secondary | ICD-10-CM | POA: Diagnosis not present

## 2019-03-16 DIAGNOSIS — R5381 Other malaise: Secondary | ICD-10-CM | POA: Diagnosis not present

## 2019-03-16 DIAGNOSIS — I4891 Unspecified atrial fibrillation: Secondary | ICD-10-CM | POA: Diagnosis not present

## 2019-03-16 DIAGNOSIS — S42101A Fracture of unspecified part of scapula, right shoulder, initial encounter for closed fracture: Secondary | ICD-10-CM | POA: Diagnosis not present

## 2019-03-16 DIAGNOSIS — S2241XD Multiple fractures of ribs, right side, subsequent encounter for fracture with routine healing: Secondary | ICD-10-CM | POA: Diagnosis not present

## 2019-03-16 DIAGNOSIS — S22050A Wedge compression fracture of T5-T6 vertebra, initial encounter for closed fracture: Secondary | ICD-10-CM | POA: Diagnosis not present

## 2019-03-16 DIAGNOSIS — U071 COVID-19: Secondary | ICD-10-CM | POA: Diagnosis not present

## 2019-03-16 DIAGNOSIS — S22049A Unspecified fracture of fourth thoracic vertebra, initial encounter for closed fracture: Secondary | ICD-10-CM | POA: Diagnosis not present

## 2019-03-16 DIAGNOSIS — Z7982 Long term (current) use of aspirin: Secondary | ICD-10-CM | POA: Diagnosis not present

## 2019-03-19 DIAGNOSIS — R5381 Other malaise: Secondary | ICD-10-CM | POA: Diagnosis not present

## 2019-03-19 DIAGNOSIS — I10 Essential (primary) hypertension: Secondary | ICD-10-CM | POA: Diagnosis not present

## 2019-03-19 DIAGNOSIS — S42101A Fracture of unspecified part of scapula, right shoulder, initial encounter for closed fracture: Secondary | ICD-10-CM | POA: Diagnosis not present

## 2019-03-19 DIAGNOSIS — S22000A Wedge compression fracture of unspecified thoracic vertebra, initial encounter for closed fracture: Secondary | ICD-10-CM | POA: Diagnosis not present

## 2019-03-19 DIAGNOSIS — S2231XA Fracture of one rib, right side, initial encounter for closed fracture: Secondary | ICD-10-CM | POA: Diagnosis not present

## 2019-03-20 DIAGNOSIS — I10 Essential (primary) hypertension: Secondary | ICD-10-CM | POA: Diagnosis not present

## 2019-03-20 DIAGNOSIS — R5381 Other malaise: Secondary | ICD-10-CM | POA: Diagnosis not present

## 2019-03-20 DIAGNOSIS — S42101A Fracture of unspecified part of scapula, right shoulder, initial encounter for closed fracture: Secondary | ICD-10-CM | POA: Diagnosis not present

## 2019-03-20 DIAGNOSIS — S2231XA Fracture of one rib, right side, initial encounter for closed fracture: Secondary | ICD-10-CM | POA: Diagnosis not present

## 2019-03-20 DIAGNOSIS — S22000A Wedge compression fracture of unspecified thoracic vertebra, initial encounter for closed fracture: Secondary | ICD-10-CM | POA: Diagnosis not present

## 2019-03-21 DIAGNOSIS — S42101A Fracture of unspecified part of scapula, right shoulder, initial encounter for closed fracture: Secondary | ICD-10-CM | POA: Diagnosis not present

## 2019-03-21 DIAGNOSIS — S22000A Wedge compression fracture of unspecified thoracic vertebra, initial encounter for closed fracture: Secondary | ICD-10-CM | POA: Diagnosis not present

## 2019-03-21 DIAGNOSIS — K5900 Constipation, unspecified: Secondary | ICD-10-CM | POA: Diagnosis not present

## 2019-03-21 DIAGNOSIS — I4891 Unspecified atrial fibrillation: Secondary | ICD-10-CM | POA: Diagnosis not present

## 2019-03-21 DIAGNOSIS — M549 Dorsalgia, unspecified: Secondary | ICD-10-CM | POA: Diagnosis not present

## 2019-03-26 DIAGNOSIS — K5900 Constipation, unspecified: Secondary | ICD-10-CM | POA: Diagnosis not present

## 2019-03-26 DIAGNOSIS — S2231XA Fracture of one rib, right side, initial encounter for closed fracture: Secondary | ICD-10-CM | POA: Diagnosis not present

## 2019-03-26 DIAGNOSIS — I10 Essential (primary) hypertension: Secondary | ICD-10-CM | POA: Diagnosis not present

## 2019-03-26 DIAGNOSIS — S22000A Wedge compression fracture of unspecified thoracic vertebra, initial encounter for closed fracture: Secondary | ICD-10-CM | POA: Diagnosis not present

## 2019-03-26 DIAGNOSIS — B379 Candidiasis, unspecified: Secondary | ICD-10-CM | POA: Diagnosis not present

## 2019-03-28 DIAGNOSIS — S2231XA Fracture of one rib, right side, initial encounter for closed fracture: Secondary | ICD-10-CM | POA: Diagnosis not present

## 2019-03-28 DIAGNOSIS — K5900 Constipation, unspecified: Secondary | ICD-10-CM | POA: Diagnosis not present

## 2019-03-28 DIAGNOSIS — S42101A Fracture of unspecified part of scapula, right shoulder, initial encounter for closed fracture: Secondary | ICD-10-CM | POA: Diagnosis not present

## 2019-03-28 DIAGNOSIS — K649 Unspecified hemorrhoids: Secondary | ICD-10-CM | POA: Diagnosis not present

## 2019-03-28 DIAGNOSIS — S22000A Wedge compression fracture of unspecified thoracic vertebra, initial encounter for closed fracture: Secondary | ICD-10-CM | POA: Diagnosis not present

## 2019-04-01 ENCOUNTER — Other Ambulatory Visit: Payer: Self-pay | Admitting: Internal Medicine

## 2019-04-01 DIAGNOSIS — S22000A Wedge compression fracture of unspecified thoracic vertebra, initial encounter for closed fracture: Secondary | ICD-10-CM | POA: Diagnosis not present

## 2019-04-01 DIAGNOSIS — I10 Essential (primary) hypertension: Secondary | ICD-10-CM | POA: Diagnosis not present

## 2019-04-01 DIAGNOSIS — S2231XA Fracture of one rib, right side, initial encounter for closed fracture: Secondary | ICD-10-CM | POA: Diagnosis not present

## 2019-04-01 DIAGNOSIS — S42101A Fracture of unspecified part of scapula, right shoulder, initial encounter for closed fracture: Secondary | ICD-10-CM | POA: Diagnosis not present

## 2019-04-01 DIAGNOSIS — I4891 Unspecified atrial fibrillation: Secondary | ICD-10-CM | POA: Diagnosis not present

## 2019-04-18 ENCOUNTER — Encounter: Payer: Self-pay | Admitting: General Practice

## 2019-04-19 DIAGNOSIS — I5033 Acute on chronic diastolic (congestive) heart failure: Secondary | ICD-10-CM | POA: Insufficient documentation

## 2019-04-19 DIAGNOSIS — S22000A Wedge compression fracture of unspecified thoracic vertebra, initial encounter for closed fracture: Secondary | ICD-10-CM | POA: Insufficient documentation

## 2019-04-19 NOTE — Progress Notes (Signed)
Subjective:    Patient ID: [name], female    DOB: December 10, 1930, 86 y.o.   MRN: [id number]  HPI The patient is here for follow up.  She is here with her niece.   ED 03/10/19 after a fall.  She was admitted for acute hpoxemic resp failure, compression fractures of T4 and 5 and right rib fracture.    She fell straight back onto her back.  There was no LOC.  She had 10/10 back pain.  She was diagnosed with compression fractures of T4 and T5, right scapula fracture and right 7-10th rib fracture.   Imaging also showed small-mod b/l pleural effusion that were enlarged compared to Ct 02/20/19. CXR showed small effusion and mild pulmonary venous congestion.   Imaging of brain, c-spine and t-spine showed no acute injuries.   BNP > 3000, trop negative.  She was discharged to rehab.     Acute diastolic HF, acute hypoxemic resp failure, Afib:  I had started her on lasix after her last appointment but according to hospital records she did not start taking it.  She was givne lasix and had strict I/O's.     Compression fx of T4, 5, right 10th rib fx:  She has a little pain in her right rib when she lays on it.  It is tolerable.  She denies back pain.  She is not doing therapy right now.  She walks the hall with her walker.  [facility] is supposed to be coming to do PT, but they are not.  She feels more PT would help.  She has had recurrent falls and has poor balance.   HFpEF, Afib, htn:   She has feels SOB and palps with walking.  She denies any SOB or palpitations at rest.  She denies leg edema.  has not established with cardiology.      Medications and allergies reviewed with patient and updated if appropriate.  Patient Active Problem List   Diagnosis Date Noted  . Acute on chronic diastolic heart failure ([place]) 04/19/2019  . Compression fracture of thoracic vertebra ([place]), T4, 5 04/19/2019  . Diastolic heart failure ([place]) 03/05/2019  . Atrial fibrillation with RVR ([place]) 02/20/2019  . Pneumonia of  both lower lobes due to infectious organism 02/20/2019  . Unsp fracture of left pubis, init encntr for closed fracture ([place]) 02/20/2019  . Fall 02/20/2019  . Fatigue 06/27/2018  . Left rotator cuff tear 02/01/2018  . Insomnia 08/30/2017  . Adjustment disorder with mixed anxiety and depressed mood 08/30/2017  . Prediabetes 09/18/2016  . Grief 04/20/2016  . Thyroid nodule 01/19/2016  . Multiple falls 01/20/2015  . Hx MRSA infection 03/05/2013  . Lap Repair Large Hiatal Hernia with Nissen May 2014 09/20/2012  . Cough 09/13/2012  . Large type III mixed hiatus hernia 08/16/2012  . Carotid arterial disease ([place]) 10/31/2011  . OTH SPEC ALVEOL&PARIETOALVEOL PNEUMONOPATHIES 04/28/2010  . HEMORRHOIDS-INTERNAL 03/01/2010  . GASTRIC OUTLET OBSTRUCTION 02/10/2010  . RHEUMATIC HEART DISEASE 02/02/2010  . ESOPHAGEAL STRICTURE 02/02/2010  . GERD 02/02/2010  . DUODENAL ULCER 02/02/2010  . HIATAL HERNIA 02/02/2010  . DIVERTICULOSIS, COLON 02/02/2010  . OSTEOARTHRITIS 02/02/2010  . Acute pain of left shoulder 10/09/2009  . DECREASED HEARING, BILATERAL 02/06/2008  . Dizziness 06/28/2007  . PEPTIC ULCER DISEASE 06/07/2007  . Hyperlipidemia 12/19/2006  . Anxiety and depression 12/19/2006  . COPD 12/19/2006  . GLAUCOMA 09/04/2006  . HTN (hypertension) 09/04/2006  . Osteoporosis 09/04/2006  . PREMATURE VENTRICULAR CONTRACTIONS 10/06/1994  .  MASTECTOMY, RIGHT, HX OF 04/18/1993    Current Outpatient Medications on File Prior to Visit  Medication Sig Dispense Refill  . aspirin 81 MG tablet Take 81 mg by mouth daily.      [name] azelastine (ASTELIN) 137 MCG/SPRAY nasal spray Place 1 spray into the nose 2 (two) times daily as needed (for allergies). Reported on 07/20/2015    . Biotin (BIOTIN ULTRA STRENGTH) 10 MG CAPS Take 1 capsule by mouth daily.    [name] ELIQUIS 5 MG TABS tablet Take 5 mg by mouth 2 (two) times daily.    . furosemide (LASIX) 20 MG tablet Take 1 tablet (20 mg total) by mouth daily. Take in  the morning 30 tablet 3  . LORazepam (ATIVAN) 2 MG tablet TAKE 1/2 TABLET BY MOUTH EVERY 8 HOURS AS NEEDED FOR ANXIETY 60 tablet 0  . losartan (COZAAR) 50 MG tablet Take 50 mg by mouth daily.    . metoprolol tartrate (LOPRESSOR) 25 MG tablet Take 25 mg by mouth 2 (two) times daily.    . pantoprazole (PROTONIX) 40 MG tablet TAKE 1 TABLET BY MOUTH EVERY DAY **NEEDS OFFICE VISIT FOR MORE REFILLS** 90 tablet 0  . polyethylene glycol (MIRALAX / GLYCOLAX) packet Take 17 g by mouth daily.    . potassium chloride SA (KLOR-CON) 20 MEQ tablet Take 1 tablet (20 mEq total) by mouth daily. 90 tablet 1  . sertraline (ZOLOFT) 100 MG tablet Take 1 tablet (100 mg total) by mouth daily. Need office visit for more refills. 90 tablet 0  . vitamin B-12 (CYANOCOBALAMIN) 1000 MCG tablet Take 1,000 mcg by mouth daily.       No current facility-administered medications on file prior to visit.    Past Medical History:  Diagnosis Date  . Anxiety   . Breast cancer ([place]) 13 years ago   s/p tamoxifen   . COPD (chronic obstructive pulmonary disease) ([place])   . Depression   . Diverticulosis   . Esophageal stricture   . Gastric outlet obstruction   . GERD (gastroesophageal reflux disease)   . Glaucoma   . Hiatal hernia   . Hiatal hernia   . Hyperlipidemia   . Hypertension   . Insomnia   . Internal hemorrhoids   . Osteoarthritis   . Osteoporosis   . Peptic ulcer disease   . Rheumatic heart disease   . Ulcerative colitis     Past Surgical History:  Procedure Laterality Date  . ABDOMINAL HYSTERECTOMY    . BREAST RECONSTRUCTION  2000   right  . BREAST SURGERY     right  . CATARACT EXTRACTION  08/2006 and 10/2006  . HEMORRHOID SURGERY    . HIATAL HERNIA REPAIR N/A 09/03/2012   Procedure: LAPAROSCOPIC REPAIR OF HIATAL HERNIA;  Surgeon: Pedro Earls, MD;  Location: [facility];  Service: General;  Laterality: N/A;  . JOINT REPLACEMENT     right  . LAPAROSCOPIC NISSEN FUNDOPLICATION N/A [id number]   Procedure:  LAP REPAIR OF LARGE TYPE III MIXED HIATUS HERNIA WITH NISSEN FUNOPLICATION;  Surgeon: Pedro Earls, MD;  Location: [facility];  Service: General;  Laterality: N/A;  . [place]   right  . TONSILLECTOMY    . TOTAL KNEE ARTHROPLASTY     left    Social History   Socioeconomic History  . Marital status: Widowed    Spouse name: Not on file  . Number of children: 3  . Years of education: Not on file  . Highest education  level: Not on file  Occupational History  . Occupation: retired-sales    Employer: RETIRED  Tobacco Use  . Smoking status: Never Smoker  . Smokeless tobacco: Never Used  Substance and Sexual Activity  . Alcohol use: No    Alcohol/week: 0.0 standard drinks  . Drug use: No  . Sexual activity: Never  Other Topics Concern  . Not on file  Social History Narrative  . Not on file   Social Determinants of Health   Financial Resource Strain:   . Difficulty of Paying Living Expenses: Not on file  Food Insecurity:   . Worried About Charity fundraiser in the Last Year: Not on file  . Ran Out of Food in the Last Year: Not on file  Transportation Needs:   . Lack of Transportation (Medical): Not on file  . Lack of Transportation (Non-Medical): Not on file  Physical Activity:   . Days of Exercise per Week: Not on file  . Minutes of Exercise per Session: Not on file  Stress:   . Feeling of Stress : Not on file  Social Connections:   . Frequency of Communication with Friends and Family: Not on file  . Frequency of Social Gatherings with Friends and Family: Not on file  . Attends Religious Services: Not on file  . Active Member of Clubs or Organizations: Not on file  . Attends Archivist Meetings: Not on file  . Marital Status: Not on file    Family History  Problem Relation Age of Onset  . Coronary artery disease Sister        x 2 in 24's  . Coronary artery disease Brother        x 2 in 70's  . Diabetes Unknown   . Colon cancer Father 30  .  Hypertension Unknown   . Stroke Mother 66  . Crohn's disease Brother   . Breast cancer Unknown        Aunt  . Cirrhosis Brother   . Liver disease Son     Review of Systems  Constitutional: Negative for chills and fever.  Respiratory: Positive for shortness of breath (with exertion). Negative for cough and wheezing.   Cardiovascular: Positive for palpitations (with activity). Negative for chest pain and leg swelling.  Neurological: Positive for dizziness (occ). Negative for headaches.       Objective:   Vitals:   04/22/19 1503  BP: 136/80  Pulse: (!) 52  Resp: 16  Temp: 98.8 F (37.1 C)  SpO2: 98%   BP Readings from Last 3 Encounters:  04/22/19 136/80  03/05/19 (!) 154/86  06/27/18 (!) 148/96   Wt Readings from Last 3 Encounters:  04/22/19 133 lb 12.8 oz (60.7 kg)  03/05/19 155 lb 12.8 oz (70.7 kg)  06/27/18 165 lb (74.8 kg)   Body mass index is 21.6 kg/m.   Physical Exam    Constitutional: Chronically ill-appearing. No distress.  HENT:  Head: Normocephalic and atraumatic.  Neck: Neck supple. No tracheal deviation present. No thyromegaly present.  No cervical lymphadenopathy Cardiovascular: Normal rate, irregular rhythm and normal heart sounds.  No murmur heard. No carotid bruit .  Trace b/l LE edema Pulmonary/Chest: Effort normal and breath sounds normal. No respiratory distress. No has no wheezes. No rales.  Musculoskeletal: No thoracic spine tenderness with palpation Skin: Skin is warm and dry. Not diaphoretic.  Psychiatric: Normal mood and affect. Behavior is normal.      Assessment & Plan:    See  Problem List for Assessment and Plan of chronic medical problems.    This visit occurred during the SARS-CoV-2 public health emergency.  Safety protocols were in place, including screening questions prior to the visit, additional usage of staff PPE, and extensive cleaning of exam room while observing appropriate contact time as indicated for disinfecting  solutions.

## 2019-04-22 ENCOUNTER — Encounter: Payer: Self-pay | Admitting: Internal Medicine

## 2019-04-22 ENCOUNTER — Ambulatory Visit (INDEPENDENT_AMBULATORY_CARE_PROVIDER_SITE_OTHER): Payer: Medicare Other | Admitting: Internal Medicine

## 2019-04-22 ENCOUNTER — Other Ambulatory Visit: Payer: Self-pay

## 2019-04-22 VITALS — BP 136/80 | HR 52 | Temp 98.8°F | Resp 16 | Ht 66.0 in | Wt 133.8 lb

## 2019-04-22 DIAGNOSIS — K219 Gastro-esophageal reflux disease without esophagitis: Secondary | ICD-10-CM

## 2019-04-22 DIAGNOSIS — I5033 Acute on chronic diastolic (congestive) heart failure: Secondary | ICD-10-CM | POA: Diagnosis not present

## 2019-04-22 DIAGNOSIS — R2689 Other abnormalities of gait and mobility: Secondary | ICD-10-CM

## 2019-04-22 DIAGNOSIS — W19XXXD Unspecified fall, subsequent encounter: Secondary | ICD-10-CM

## 2019-04-22 DIAGNOSIS — M810 Age-related osteoporosis without current pathological fracture: Secondary | ICD-10-CM

## 2019-04-22 DIAGNOSIS — I10 Essential (primary) hypertension: Secondary | ICD-10-CM

## 2019-04-22 DIAGNOSIS — S22040A Wedge compression fracture of fourth thoracic vertebra, initial encounter for closed fracture: Secondary | ICD-10-CM

## 2019-04-22 DIAGNOSIS — R5381 Other malaise: Secondary | ICD-10-CM | POA: Diagnosis not present

## 2019-04-22 DIAGNOSIS — R296 Repeated falls: Secondary | ICD-10-CM | POA: Diagnosis not present

## 2019-04-22 DIAGNOSIS — I4891 Unspecified atrial fibrillation: Secondary | ICD-10-CM

## 2019-04-22 MED ORDER — PANTOPRAZOLE SODIUM 40 MG PO TBEC: DELAYED_RELEASE_TABLET | ORAL | 1 refills | Status: AC

## 2019-04-22 MED ORDER — SERTRALINE HCL 100 MG PO TABS: 100.0000 mg | ORAL_TABLET | Freq: Every day | ORAL | 1 refills | Status: DC

## 2019-04-22 NOTE — Assessment & Plan Note (Signed)
GERD controlled Continue daily medication  

## 2019-04-22 NOTE — Patient Instructions (Addendum)
[facility] Address: [address], [place], [place] [address] Phone: [phone]      Medications reviewed and updated.  Changes include :   none  Your prescription(s) have been submitted to your pharmacy. Please take as directed and contact our office if you believe you are having problem(s) with the medication(s).  A referral was ordered for physical therapy.    Please followup in 4 months

## 2019-04-22 NOTE — Assessment & Plan Note (Signed)
BP well controlled Current regimen effective and well tolerated Continue current medications at current doses  

## 2019-04-22 NOTE — Assessment & Plan Note (Signed)
In Afib here today, rate controlled Denies palps, but does have SOB and palps with exertion  Will establish with cardiology Continue current medications for now

## 2019-04-22 NOTE — Assessment & Plan Note (Signed)
Has declined repeat DEXA and medication in the past Discussed osteoporosis and risk for additional fractures Physical deconditioning, poor balance, recurrent falls, multiple fractures-should be on medication Discussed Fosamax versus Prolia Has GERD and I am concerned the Fosamax may cause side effects Will look into cost of Prolia and will discuss with her son given her mild dementia With next blood work will check vitamin D level

## 2019-04-22 NOTE — Assessment & Plan Note (Signed)
euvolemic here today Needs to establish with cardiology - number given to niece - she will call and schedule an appointment Continue current medications

## 2019-04-22 NOTE — Assessment & Plan Note (Signed)
recurrent falls, poor balance, physical deconditioning Uses walker to ambulate Discussed fall prevention Would benefit from additional physical therapy-we will order home PT

## 2019-04-22 NOTE — Assessment & Plan Note (Signed)
Denies back pain Walking with walker - discussed preventing falls Will order home PT

## 2019-04-25 ENCOUNTER — Telehealth: Payer: Self-pay | Admitting: Internal Medicine

## 2019-04-25 NOTE — Telephone Encounter (Signed)
-----   Message from Binnie Rail, MD sent at 04/22/2019  6:22 PM EST ----- [name] - are you doing initial prior auth/check on price for prolia?  [name] is interested if not too expensive.  She has osteoporosis and has had multiple fractures.    Info would need to be communicated with her son Coralyn Mark (# in chart) due to mild dementia.     SB

## 2019-04-25 NOTE — Telephone Encounter (Signed)
Will run benefits and call patient to schedule once they have been received.

## 2019-04-26 DIAGNOSIS — I6529 Occlusion and stenosis of unspecified carotid artery: Secondary | ICD-10-CM | POA: Diagnosis not present

## 2019-04-26 DIAGNOSIS — M8580 Other specified disorders of bone density and structure, unspecified site: Secondary | ICD-10-CM | POA: Diagnosis not present

## 2019-04-26 DIAGNOSIS — M8008XD Age-related osteoporosis with current pathological fracture, vertebra(e), subsequent encounter for fracture with routine healing: Secondary | ICD-10-CM | POA: Diagnosis not present

## 2019-04-26 DIAGNOSIS — M80011D Age-related osteoporosis with current pathological fracture, right shoulder, subsequent encounter for fracture with routine healing: Secondary | ICD-10-CM | POA: Diagnosis not present

## 2019-04-26 DIAGNOSIS — R7303 Prediabetes: Secondary | ICD-10-CM | POA: Diagnosis not present

## 2019-04-26 DIAGNOSIS — H9193 Unspecified hearing loss, bilateral: Secondary | ICD-10-CM | POA: Diagnosis not present

## 2019-04-26 DIAGNOSIS — I4891 Unspecified atrial fibrillation: Secondary | ICD-10-CM | POA: Diagnosis not present

## 2019-04-26 DIAGNOSIS — K5730 Diverticulosis of large intestine without perforation or abscess without bleeding: Secondary | ICD-10-CM | POA: Diagnosis not present

## 2019-04-26 DIAGNOSIS — M47812 Spondylosis without myelopathy or radiculopathy, cervical region: Secondary | ICD-10-CM | POA: Diagnosis not present

## 2019-04-26 DIAGNOSIS — I340 Nonrheumatic mitral (valve) insufficiency: Secondary | ICD-10-CM | POA: Diagnosis not present

## 2019-04-26 DIAGNOSIS — M800AXD Age-related osteoporosis with current pathological fracture, other site, subsequent encounter for fracture with routine healing: Secondary | ICD-10-CM | POA: Diagnosis not present

## 2019-04-26 DIAGNOSIS — Z853 Personal history of malignant neoplasm of breast: Secondary | ICD-10-CM | POA: Diagnosis not present

## 2019-04-26 DIAGNOSIS — G4700 Insomnia, unspecified: Secondary | ICD-10-CM | POA: Diagnosis not present

## 2019-04-26 DIAGNOSIS — I110 Hypertensive heart disease with heart failure: Secondary | ICD-10-CM | POA: Diagnosis not present

## 2019-04-26 DIAGNOSIS — K219 Gastro-esophageal reflux disease without esophagitis: Secondary | ICD-10-CM | POA: Diagnosis not present

## 2019-04-26 DIAGNOSIS — J449 Chronic obstructive pulmonary disease, unspecified: Secondary | ICD-10-CM | POA: Diagnosis not present

## 2019-04-26 DIAGNOSIS — I5033 Acute on chronic diastolic (congestive) heart failure: Secondary | ICD-10-CM | POA: Diagnosis not present

## 2019-04-26 DIAGNOSIS — H409 Unspecified glaucoma: Secondary | ICD-10-CM | POA: Diagnosis not present

## 2019-04-26 DIAGNOSIS — E785 Hyperlipidemia, unspecified: Secondary | ICD-10-CM | POA: Diagnosis not present

## 2019-04-26 DIAGNOSIS — Z7901 Long term (current) use of anticoagulants: Secondary | ICD-10-CM | POA: Diagnosis not present

## 2019-04-30 ENCOUNTER — Telehealth: Payer: Self-pay | Admitting: Internal Medicine

## 2019-04-30 DIAGNOSIS — E785 Hyperlipidemia, unspecified: Secondary | ICD-10-CM | POA: Diagnosis not present

## 2019-04-30 DIAGNOSIS — I110 Hypertensive heart disease with heart failure: Secondary | ICD-10-CM | POA: Diagnosis not present

## 2019-04-30 DIAGNOSIS — I340 Nonrheumatic mitral (valve) insufficiency: Secondary | ICD-10-CM | POA: Diagnosis not present

## 2019-04-30 DIAGNOSIS — M8008XD Age-related osteoporosis with current pathological fracture, vertebra(e), subsequent encounter for fracture with routine healing: Secondary | ICD-10-CM | POA: Diagnosis not present

## 2019-04-30 DIAGNOSIS — H409 Unspecified glaucoma: Secondary | ICD-10-CM | POA: Diagnosis not present

## 2019-04-30 DIAGNOSIS — Z7901 Long term (current) use of anticoagulants: Secondary | ICD-10-CM | POA: Diagnosis not present

## 2019-04-30 DIAGNOSIS — G4700 Insomnia, unspecified: Secondary | ICD-10-CM | POA: Diagnosis not present

## 2019-04-30 DIAGNOSIS — K219 Gastro-esophageal reflux disease without esophagitis: Secondary | ICD-10-CM | POA: Diagnosis not present

## 2019-04-30 DIAGNOSIS — R7303 Prediabetes: Secondary | ICD-10-CM | POA: Diagnosis not present

## 2019-04-30 DIAGNOSIS — M80011D Age-related osteoporosis with current pathological fracture, right shoulder, subsequent encounter for fracture with routine healing: Secondary | ICD-10-CM | POA: Diagnosis not present

## 2019-04-30 DIAGNOSIS — Z853 Personal history of malignant neoplasm of breast: Secondary | ICD-10-CM | POA: Diagnosis not present

## 2019-04-30 DIAGNOSIS — M8580 Other specified disorders of bone density and structure, unspecified site: Secondary | ICD-10-CM | POA: Diagnosis not present

## 2019-04-30 DIAGNOSIS — H9193 Unspecified hearing loss, bilateral: Secondary | ICD-10-CM | POA: Diagnosis not present

## 2019-04-30 DIAGNOSIS — M800AXD Age-related osteoporosis with current pathological fracture, other site, subsequent encounter for fracture with routine healing: Secondary | ICD-10-CM | POA: Diagnosis not present

## 2019-04-30 DIAGNOSIS — I6529 Occlusion and stenosis of unspecified carotid artery: Secondary | ICD-10-CM | POA: Diagnosis not present

## 2019-04-30 DIAGNOSIS — K5730 Diverticulosis of large intestine without perforation or abscess without bleeding: Secondary | ICD-10-CM | POA: Diagnosis not present

## 2019-04-30 DIAGNOSIS — J449 Chronic obstructive pulmonary disease, unspecified: Secondary | ICD-10-CM | POA: Diagnosis not present

## 2019-04-30 DIAGNOSIS — M47812 Spondylosis without myelopathy or radiculopathy, cervical region: Secondary | ICD-10-CM | POA: Diagnosis not present

## 2019-04-30 DIAGNOSIS — I5033 Acute on chronic diastolic (congestive) heart failure: Secondary | ICD-10-CM | POA: Diagnosis not present

## 2019-04-30 DIAGNOSIS — I4891 Unspecified atrial fibrillation: Secondary | ICD-10-CM | POA: Diagnosis not present

## 2019-04-30 NOTE — Telephone Encounter (Signed)
Okay for verbal orders. 

## 2019-04-30 NOTE — Telephone Encounter (Signed)
Home Health Verbal Orders - Caller/Agency: Mariel Kansky Number: [phone] Requesting OT/PT/Skilled Nursing/Social Work/Speech Therapy: Needs verbal orders for PT at earliest convenience  Frequency:  1 w 1 2 w 2 1 w 1

## 2019-04-30 NOTE — Telephone Encounter (Signed)
Gave ok for orders.  

## 2019-05-01 DIAGNOSIS — M8580 Other specified disorders of bone density and structure, unspecified site: Secondary | ICD-10-CM

## 2019-05-01 DIAGNOSIS — Z7901 Long term (current) use of anticoagulants: Secondary | ICD-10-CM

## 2019-05-01 DIAGNOSIS — I6529 Occlusion and stenosis of unspecified carotid artery: Secondary | ICD-10-CM

## 2019-05-01 DIAGNOSIS — K219 Gastro-esophageal reflux disease without esophagitis: Secondary | ICD-10-CM

## 2019-05-01 DIAGNOSIS — H9193 Unspecified hearing loss, bilateral: Secondary | ICD-10-CM

## 2019-05-01 DIAGNOSIS — M80011D Age-related osteoporosis with current pathological fracture, right shoulder, subsequent encounter for fracture with routine healing: Secondary | ICD-10-CM | POA: Diagnosis not present

## 2019-05-01 DIAGNOSIS — F411 Generalized anxiety disorder: Secondary | ICD-10-CM

## 2019-05-01 DIAGNOSIS — M800AXD Age-related osteoporosis with current pathological fracture, other site, subsequent encounter for fracture with routine healing: Secondary | ICD-10-CM | POA: Diagnosis not present

## 2019-05-01 DIAGNOSIS — Z853 Personal history of malignant neoplasm of breast: Secondary | ICD-10-CM

## 2019-05-01 DIAGNOSIS — I4891 Unspecified atrial fibrillation: Secondary | ICD-10-CM

## 2019-05-01 DIAGNOSIS — E785 Hyperlipidemia, unspecified: Secondary | ICD-10-CM

## 2019-05-01 DIAGNOSIS — I5033 Acute on chronic diastolic (congestive) heart failure: Secondary | ICD-10-CM | POA: Diagnosis not present

## 2019-05-01 DIAGNOSIS — M8008XD Age-related osteoporosis with current pathological fracture, vertebra(e), subsequent encounter for fracture with routine healing: Secondary | ICD-10-CM | POA: Diagnosis not present

## 2019-05-01 DIAGNOSIS — G4700 Insomnia, unspecified: Secondary | ICD-10-CM

## 2019-05-01 DIAGNOSIS — I340 Nonrheumatic mitral (valve) insufficiency: Secondary | ICD-10-CM

## 2019-05-01 DIAGNOSIS — K5730 Diverticulosis of large intestine without perforation or abscess without bleeding: Secondary | ICD-10-CM

## 2019-05-01 DIAGNOSIS — I110 Hypertensive heart disease with heart failure: Secondary | ICD-10-CM | POA: Diagnosis not present

## 2019-05-01 DIAGNOSIS — H409 Unspecified glaucoma: Secondary | ICD-10-CM

## 2019-05-01 DIAGNOSIS — F329 Major depressive disorder, single episode, unspecified: Secondary | ICD-10-CM

## 2019-05-01 DIAGNOSIS — F4323 Adjustment disorder with mixed anxiety and depressed mood: Secondary | ICD-10-CM

## 2019-05-01 DIAGNOSIS — M47812 Spondylosis without myelopathy or radiculopathy, cervical region: Secondary | ICD-10-CM

## 2019-05-01 DIAGNOSIS — R7303 Prediabetes: Secondary | ICD-10-CM

## 2019-05-01 DIAGNOSIS — J449 Chronic obstructive pulmonary disease, unspecified: Secondary | ICD-10-CM

## 2019-05-02 DIAGNOSIS — I110 Hypertensive heart disease with heart failure: Secondary | ICD-10-CM | POA: Diagnosis not present

## 2019-05-02 DIAGNOSIS — J449 Chronic obstructive pulmonary disease, unspecified: Secondary | ICD-10-CM | POA: Diagnosis not present

## 2019-05-02 DIAGNOSIS — M80011D Age-related osteoporosis with current pathological fracture, right shoulder, subsequent encounter for fracture with routine healing: Secondary | ICD-10-CM | POA: Diagnosis not present

## 2019-05-02 DIAGNOSIS — I4891 Unspecified atrial fibrillation: Secondary | ICD-10-CM | POA: Diagnosis not present

## 2019-05-02 DIAGNOSIS — M8580 Other specified disorders of bone density and structure, unspecified site: Secondary | ICD-10-CM | POA: Diagnosis not present

## 2019-05-02 DIAGNOSIS — M47812 Spondylosis without myelopathy or radiculopathy, cervical region: Secondary | ICD-10-CM | POA: Diagnosis not present

## 2019-05-02 DIAGNOSIS — H9193 Unspecified hearing loss, bilateral: Secondary | ICD-10-CM | POA: Diagnosis not present

## 2019-05-02 DIAGNOSIS — I6529 Occlusion and stenosis of unspecified carotid artery: Secondary | ICD-10-CM | POA: Diagnosis not present

## 2019-05-02 DIAGNOSIS — G4700 Insomnia, unspecified: Secondary | ICD-10-CM | POA: Diagnosis not present

## 2019-05-02 DIAGNOSIS — R7303 Prediabetes: Secondary | ICD-10-CM | POA: Diagnosis not present

## 2019-05-02 DIAGNOSIS — I340 Nonrheumatic mitral (valve) insufficiency: Secondary | ICD-10-CM | POA: Diagnosis not present

## 2019-05-02 DIAGNOSIS — Z7901 Long term (current) use of anticoagulants: Secondary | ICD-10-CM | POA: Diagnosis not present

## 2019-05-02 DIAGNOSIS — K219 Gastro-esophageal reflux disease without esophagitis: Secondary | ICD-10-CM | POA: Diagnosis not present

## 2019-05-02 DIAGNOSIS — E785 Hyperlipidemia, unspecified: Secondary | ICD-10-CM | POA: Diagnosis not present

## 2019-05-02 DIAGNOSIS — M800AXD Age-related osteoporosis with current pathological fracture, other site, subsequent encounter for fracture with routine healing: Secondary | ICD-10-CM | POA: Diagnosis not present

## 2019-05-02 DIAGNOSIS — H409 Unspecified glaucoma: Secondary | ICD-10-CM | POA: Diagnosis not present

## 2019-05-02 DIAGNOSIS — K5730 Diverticulosis of large intestine without perforation or abscess without bleeding: Secondary | ICD-10-CM | POA: Diagnosis not present

## 2019-05-02 DIAGNOSIS — Z853 Personal history of malignant neoplasm of breast: Secondary | ICD-10-CM | POA: Diagnosis not present

## 2019-05-02 DIAGNOSIS — M8008XD Age-related osteoporosis with current pathological fracture, vertebra(e), subsequent encounter for fracture with routine healing: Secondary | ICD-10-CM | POA: Diagnosis not present

## 2019-05-02 DIAGNOSIS — I5033 Acute on chronic diastolic (congestive) heart failure: Secondary | ICD-10-CM | POA: Diagnosis not present

## 2019-05-04 DIAGNOSIS — R0602 Shortness of breath: Secondary | ICD-10-CM | POA: Diagnosis not present

## 2019-05-04 DIAGNOSIS — I10 Essential (primary) hypertension: Secondary | ICD-10-CM | POA: Diagnosis not present

## 2019-05-04 DIAGNOSIS — I480 Paroxysmal atrial fibrillation: Secondary | ICD-10-CM | POA: Diagnosis not present

## 2019-05-04 DIAGNOSIS — Z7982 Long term (current) use of aspirin: Secondary | ICD-10-CM | POA: Diagnosis not present

## 2019-05-04 DIAGNOSIS — R079 Chest pain, unspecified: Secondary | ICD-10-CM | POA: Diagnosis not present

## 2019-05-04 DIAGNOSIS — R7989 Other specified abnormal findings of blood chemistry: Secondary | ICD-10-CM | POA: Diagnosis not present

## 2019-05-04 DIAGNOSIS — Z7901 Long term (current) use of anticoagulants: Secondary | ICD-10-CM | POA: Diagnosis not present

## 2019-05-04 DIAGNOSIS — Z79899 Other long term (current) drug therapy: Secondary | ICD-10-CM | POA: Diagnosis not present

## 2019-05-04 DIAGNOSIS — I517 Cardiomegaly: Secondary | ICD-10-CM | POA: Diagnosis not present

## 2019-05-04 DIAGNOSIS — Z853 Personal history of malignant neoplasm of breast: Secondary | ICD-10-CM | POA: Diagnosis not present

## 2019-05-06 ENCOUNTER — Other Ambulatory Visit: Payer: Self-pay | Admitting: Internal Medicine

## 2019-05-06 DIAGNOSIS — I4891 Unspecified atrial fibrillation: Secondary | ICD-10-CM | POA: Diagnosis not present

## 2019-05-06 DIAGNOSIS — M8580 Other specified disorders of bone density and structure, unspecified site: Secondary | ICD-10-CM | POA: Diagnosis not present

## 2019-05-06 DIAGNOSIS — G4700 Insomnia, unspecified: Secondary | ICD-10-CM | POA: Diagnosis not present

## 2019-05-06 DIAGNOSIS — I110 Hypertensive heart disease with heart failure: Secondary | ICD-10-CM | POA: Diagnosis not present

## 2019-05-06 DIAGNOSIS — H9193 Unspecified hearing loss, bilateral: Secondary | ICD-10-CM | POA: Diagnosis not present

## 2019-05-06 DIAGNOSIS — J449 Chronic obstructive pulmonary disease, unspecified: Secondary | ICD-10-CM | POA: Diagnosis not present

## 2019-05-06 DIAGNOSIS — M47812 Spondylosis without myelopathy or radiculopathy, cervical region: Secondary | ICD-10-CM | POA: Diagnosis not present

## 2019-05-06 DIAGNOSIS — E785 Hyperlipidemia, unspecified: Secondary | ICD-10-CM | POA: Diagnosis not present

## 2019-05-06 DIAGNOSIS — K219 Gastro-esophageal reflux disease without esophagitis: Secondary | ICD-10-CM | POA: Diagnosis not present

## 2019-05-06 DIAGNOSIS — M8008XD Age-related osteoporosis with current pathological fracture, vertebra(e), subsequent encounter for fracture with routine healing: Secondary | ICD-10-CM | POA: Diagnosis not present

## 2019-05-06 DIAGNOSIS — K5730 Diverticulosis of large intestine without perforation or abscess without bleeding: Secondary | ICD-10-CM | POA: Diagnosis not present

## 2019-05-06 DIAGNOSIS — Z853 Personal history of malignant neoplasm of breast: Secondary | ICD-10-CM | POA: Diagnosis not present

## 2019-05-06 DIAGNOSIS — M80011D Age-related osteoporosis with current pathological fracture, right shoulder, subsequent encounter for fracture with routine healing: Secondary | ICD-10-CM | POA: Diagnosis not present

## 2019-05-06 DIAGNOSIS — Z7901 Long term (current) use of anticoagulants: Secondary | ICD-10-CM | POA: Diagnosis not present

## 2019-05-06 DIAGNOSIS — M800AXD Age-related osteoporosis with current pathological fracture, other site, subsequent encounter for fracture with routine healing: Secondary | ICD-10-CM | POA: Diagnosis not present

## 2019-05-06 DIAGNOSIS — H409 Unspecified glaucoma: Secondary | ICD-10-CM | POA: Diagnosis not present

## 2019-05-06 DIAGNOSIS — R7303 Prediabetes: Secondary | ICD-10-CM | POA: Diagnosis not present

## 2019-05-06 DIAGNOSIS — I5033 Acute on chronic diastolic (congestive) heart failure: Secondary | ICD-10-CM | POA: Diagnosis not present

## 2019-05-06 DIAGNOSIS — I340 Nonrheumatic mitral (valve) insufficiency: Secondary | ICD-10-CM | POA: Diagnosis not present

## 2019-05-06 DIAGNOSIS — I6529 Occlusion and stenosis of unspecified carotid artery: Secondary | ICD-10-CM | POA: Diagnosis not present

## 2019-05-07 ENCOUNTER — Telehealth: Payer: Self-pay | Admitting: Internal Medicine

## 2019-05-07 NOTE — Telephone Encounter (Signed)
Medication Refill - Medication: ELIQUIS 5 MG TABS tablet  Pt was just seen in office on 04/22/2019  Pt runs out of supply Friday morning 05/10/2019  Has the patient contacted their pharmacy? Yes (Agent: If no, request that the patient contact the pharmacy for the refill.) (Agent: If yes, when and what did the pharmacy advise?)  Preferred Pharmacy (with phone number or street name):  [id number] [place], [place]  [place] [address]  Phone: [phone] Fax: [phone]     Agent: Please be advised that RX refills may take up to 3 business days. We ask that you follow-up with your pharmacy.

## 2019-05-08 DIAGNOSIS — G4700 Insomnia, unspecified: Secondary | ICD-10-CM | POA: Diagnosis not present

## 2019-05-08 DIAGNOSIS — K5730 Diverticulosis of large intestine without perforation or abscess without bleeding: Secondary | ICD-10-CM | POA: Diagnosis not present

## 2019-05-08 DIAGNOSIS — K219 Gastro-esophageal reflux disease without esophagitis: Secondary | ICD-10-CM | POA: Diagnosis not present

## 2019-05-08 DIAGNOSIS — I340 Nonrheumatic mitral (valve) insufficiency: Secondary | ICD-10-CM | POA: Diagnosis not present

## 2019-05-08 DIAGNOSIS — M8580 Other specified disorders of bone density and structure, unspecified site: Secondary | ICD-10-CM | POA: Diagnosis not present

## 2019-05-08 DIAGNOSIS — M80011D Age-related osteoporosis with current pathological fracture, right shoulder, subsequent encounter for fracture with routine healing: Secondary | ICD-10-CM | POA: Diagnosis not present

## 2019-05-08 DIAGNOSIS — M47812 Spondylosis without myelopathy or radiculopathy, cervical region: Secondary | ICD-10-CM | POA: Diagnosis not present

## 2019-05-08 DIAGNOSIS — J449 Chronic obstructive pulmonary disease, unspecified: Secondary | ICD-10-CM | POA: Diagnosis not present

## 2019-05-08 DIAGNOSIS — I5033 Acute on chronic diastolic (congestive) heart failure: Secondary | ICD-10-CM | POA: Diagnosis not present

## 2019-05-08 DIAGNOSIS — I110 Hypertensive heart disease with heart failure: Secondary | ICD-10-CM | POA: Diagnosis not present

## 2019-05-08 DIAGNOSIS — R7303 Prediabetes: Secondary | ICD-10-CM | POA: Diagnosis not present

## 2019-05-08 DIAGNOSIS — Z853 Personal history of malignant neoplasm of breast: Secondary | ICD-10-CM | POA: Diagnosis not present

## 2019-05-08 DIAGNOSIS — H9193 Unspecified hearing loss, bilateral: Secondary | ICD-10-CM | POA: Diagnosis not present

## 2019-05-08 DIAGNOSIS — M800AXD Age-related osteoporosis with current pathological fracture, other site, subsequent encounter for fracture with routine healing: Secondary | ICD-10-CM | POA: Diagnosis not present

## 2019-05-08 DIAGNOSIS — I6529 Occlusion and stenosis of unspecified carotid artery: Secondary | ICD-10-CM | POA: Diagnosis not present

## 2019-05-08 DIAGNOSIS — I4891 Unspecified atrial fibrillation: Secondary | ICD-10-CM | POA: Diagnosis not present

## 2019-05-08 DIAGNOSIS — M8008XD Age-related osteoporosis with current pathological fracture, vertebra(e), subsequent encounter for fracture with routine healing: Secondary | ICD-10-CM | POA: Diagnosis not present

## 2019-05-08 DIAGNOSIS — E785 Hyperlipidemia, unspecified: Secondary | ICD-10-CM | POA: Diagnosis not present

## 2019-05-08 DIAGNOSIS — Z7901 Long term (current) use of anticoagulants: Secondary | ICD-10-CM | POA: Diagnosis not present

## 2019-05-08 DIAGNOSIS — H409 Unspecified glaucoma: Secondary | ICD-10-CM | POA: Diagnosis not present

## 2019-05-08 MED ORDER — ELIQUIS 5 MG PO TABS: 5.0000 mg | ORAL_TABLET | Freq: Two times a day (BID) | ORAL | 1 refills | Status: DC

## 2019-05-08 NOTE — Telephone Encounter (Signed)
Ok to fill since you were not last prescriber?

## 2019-05-08 NOTE — Telephone Encounter (Signed)
[place] for [name] to fill.  She needs to establish with cardiology - they have tried to reach here -- may need to call her son - he may need to call cardio and set up appt.  She needs to establish with them

## 2019-05-08 NOTE — Telephone Encounter (Signed)
Appointment made with cardiology in feb.

## 2019-05-09 ENCOUNTER — Telehealth: Payer: Self-pay

## 2019-05-09 NOTE — Telephone Encounter (Signed)
New message    Son Janina Hoffert calling back to speak with CMA.    In a meeting from 10:00am to 11:00am

## 2019-05-09 NOTE — Telephone Encounter (Signed)
Returned sons call and advised that I spoke with niece yesterday and an appointment for cardiology is made.

## 2019-05-14 ENCOUNTER — Telehealth: Payer: Self-pay | Admitting: Internal Medicine

## 2019-05-14 ENCOUNTER — Telehealth: Payer: Self-pay

## 2019-05-14 DIAGNOSIS — Z7901 Long term (current) use of anticoagulants: Secondary | ICD-10-CM | POA: Diagnosis not present

## 2019-05-14 DIAGNOSIS — K219 Gastro-esophageal reflux disease without esophagitis: Secondary | ICD-10-CM | POA: Diagnosis not present

## 2019-05-14 DIAGNOSIS — R7303 Prediabetes: Secondary | ICD-10-CM | POA: Diagnosis not present

## 2019-05-14 DIAGNOSIS — R399 Unspecified symptoms and signs involving the genitourinary system: Secondary | ICD-10-CM

## 2019-05-14 DIAGNOSIS — I110 Hypertensive heart disease with heart failure: Secondary | ICD-10-CM | POA: Diagnosis not present

## 2019-05-14 DIAGNOSIS — M80011D Age-related osteoporosis with current pathological fracture, right shoulder, subsequent encounter for fracture with routine healing: Secondary | ICD-10-CM | POA: Diagnosis not present

## 2019-05-14 DIAGNOSIS — I5033 Acute on chronic diastolic (congestive) heart failure: Secondary | ICD-10-CM | POA: Diagnosis not present

## 2019-05-14 DIAGNOSIS — I6529 Occlusion and stenosis of unspecified carotid artery: Secondary | ICD-10-CM | POA: Diagnosis not present

## 2019-05-14 DIAGNOSIS — H9193 Unspecified hearing loss, bilateral: Secondary | ICD-10-CM | POA: Diagnosis not present

## 2019-05-14 DIAGNOSIS — M8008XD Age-related osteoporosis with current pathological fracture, vertebra(e), subsequent encounter for fracture with routine healing: Secondary | ICD-10-CM | POA: Diagnosis not present

## 2019-05-14 DIAGNOSIS — J449 Chronic obstructive pulmonary disease, unspecified: Secondary | ICD-10-CM | POA: Diagnosis not present

## 2019-05-14 DIAGNOSIS — E785 Hyperlipidemia, unspecified: Secondary | ICD-10-CM | POA: Diagnosis not present

## 2019-05-14 DIAGNOSIS — I4891 Unspecified atrial fibrillation: Secondary | ICD-10-CM | POA: Diagnosis not present

## 2019-05-14 DIAGNOSIS — M8580 Other specified disorders of bone density and structure, unspecified site: Secondary | ICD-10-CM | POA: Diagnosis not present

## 2019-05-14 DIAGNOSIS — Z853 Personal history of malignant neoplasm of breast: Secondary | ICD-10-CM | POA: Diagnosis not present

## 2019-05-14 DIAGNOSIS — G4700 Insomnia, unspecified: Secondary | ICD-10-CM | POA: Diagnosis not present

## 2019-05-14 DIAGNOSIS — H409 Unspecified glaucoma: Secondary | ICD-10-CM | POA: Diagnosis not present

## 2019-05-14 DIAGNOSIS — K5730 Diverticulosis of large intestine without perforation or abscess without bleeding: Secondary | ICD-10-CM | POA: Diagnosis not present

## 2019-05-14 DIAGNOSIS — M47812 Spondylosis without myelopathy or radiculopathy, cervical region: Secondary | ICD-10-CM | POA: Diagnosis not present

## 2019-05-14 DIAGNOSIS — I340 Nonrheumatic mitral (valve) insufficiency: Secondary | ICD-10-CM | POA: Diagnosis not present

## 2019-05-14 DIAGNOSIS — M800AXD Age-related osteoporosis with current pathological fracture, other site, subsequent encounter for fracture with routine healing: Secondary | ICD-10-CM | POA: Diagnosis not present

## 2019-05-14 NOTE — Telephone Encounter (Signed)
    [place] calling to request order for urine culture  Please call Ruthie at [phone]

## 2019-05-14 NOTE — Telephone Encounter (Signed)
New message    Verbal order for Nursing evaluation.

## 2019-05-14 NOTE — Telephone Encounter (Signed)
LVM giving ok for orders.  

## 2019-05-14 NOTE — Telephone Encounter (Signed)
Gave ok for orders. Will fax over written orders.

## 2019-05-15 DIAGNOSIS — G4700 Insomnia, unspecified: Secondary | ICD-10-CM | POA: Diagnosis not present

## 2019-05-15 DIAGNOSIS — J449 Chronic obstructive pulmonary disease, unspecified: Secondary | ICD-10-CM | POA: Diagnosis not present

## 2019-05-15 DIAGNOSIS — I340 Nonrheumatic mitral (valve) insufficiency: Secondary | ICD-10-CM | POA: Diagnosis not present

## 2019-05-15 DIAGNOSIS — M8580 Other specified disorders of bone density and structure, unspecified site: Secondary | ICD-10-CM | POA: Diagnosis not present

## 2019-05-15 DIAGNOSIS — H9193 Unspecified hearing loss, bilateral: Secondary | ICD-10-CM | POA: Diagnosis not present

## 2019-05-15 DIAGNOSIS — I110 Hypertensive heart disease with heart failure: Secondary | ICD-10-CM | POA: Diagnosis not present

## 2019-05-15 DIAGNOSIS — M800AXD Age-related osteoporosis with current pathological fracture, other site, subsequent encounter for fracture with routine healing: Secondary | ICD-10-CM | POA: Diagnosis not present

## 2019-05-15 DIAGNOSIS — I4891 Unspecified atrial fibrillation: Secondary | ICD-10-CM | POA: Diagnosis not present

## 2019-05-15 DIAGNOSIS — H409 Unspecified glaucoma: Secondary | ICD-10-CM | POA: Diagnosis not present

## 2019-05-15 DIAGNOSIS — R7303 Prediabetes: Secondary | ICD-10-CM | POA: Diagnosis not present

## 2019-05-15 DIAGNOSIS — K219 Gastro-esophageal reflux disease without esophagitis: Secondary | ICD-10-CM | POA: Diagnosis not present

## 2019-05-15 DIAGNOSIS — I5033 Acute on chronic diastolic (congestive) heart failure: Secondary | ICD-10-CM | POA: Diagnosis not present

## 2019-05-15 DIAGNOSIS — K5730 Diverticulosis of large intestine without perforation or abscess without bleeding: Secondary | ICD-10-CM | POA: Diagnosis not present

## 2019-05-15 DIAGNOSIS — M47812 Spondylosis without myelopathy or radiculopathy, cervical region: Secondary | ICD-10-CM | POA: Diagnosis not present

## 2019-05-15 DIAGNOSIS — Z853 Personal history of malignant neoplasm of breast: Secondary | ICD-10-CM | POA: Diagnosis not present

## 2019-05-15 DIAGNOSIS — E785 Hyperlipidemia, unspecified: Secondary | ICD-10-CM | POA: Diagnosis not present

## 2019-05-15 DIAGNOSIS — I6529 Occlusion and stenosis of unspecified carotid artery: Secondary | ICD-10-CM | POA: Diagnosis not present

## 2019-05-15 DIAGNOSIS — Z7901 Long term (current) use of anticoagulants: Secondary | ICD-10-CM | POA: Diagnosis not present

## 2019-05-15 DIAGNOSIS — M80011D Age-related osteoporosis with current pathological fracture, right shoulder, subsequent encounter for fracture with routine healing: Secondary | ICD-10-CM | POA: Diagnosis not present

## 2019-05-15 DIAGNOSIS — M8008XD Age-related osteoporosis with current pathological fracture, vertebra(e), subsequent encounter for fracture with routine healing: Secondary | ICD-10-CM | POA: Diagnosis not present

## 2019-05-15 NOTE — Telephone Encounter (Signed)
   [facility] calling for skilled nursing verbal order.   Please call [phone]

## 2019-05-15 NOTE — Telephone Encounter (Signed)
LVM giving ok for orders.  

## 2019-05-16 ENCOUNTER — Other Ambulatory Visit (INDEPENDENT_AMBULATORY_CARE_PROVIDER_SITE_OTHER): Payer: Medicare Other

## 2019-05-16 DIAGNOSIS — R399 Unspecified symptoms and signs involving the genitourinary system: Secondary | ICD-10-CM

## 2019-05-16 LAB — URINALYSIS, ROUTINE W REFLEX MICROSCOPIC
Bilirubin Urine: NEGATIVE
Hgb urine dipstick: NEGATIVE
Ketones, ur: NEGATIVE
Nitrite: POSITIVE — AB
RBC / HPF: NONE SEEN (ref 0–?)
Specific Gravity, Urine: 1.015 (ref 1.000–1.030)
Total Protein, Urine: NEGATIVE
Urine Glucose: NEGATIVE
Urobilinogen, UA: 1 (ref 0.0–1.0)
pH: 5 (ref 5.0–8.0)

## 2019-05-16 NOTE — Addendum Note (Signed)
Addended by: Juliet Rude on: 05/16/2019 02:48 PM   Modules accepted: Orders

## 2019-05-16 NOTE — Telephone Encounter (Signed)
Pt presented to the lab downstairs with a urine sample. I have entered a UA and culture order after reviewing this telephone note. I think it was a miscommunication between [facility] and the patient in regard.

## 2019-05-17 ENCOUNTER — Other Ambulatory Visit: Payer: Self-pay | Admitting: Internal Medicine

## 2019-05-17 MED ORDER — AMOXICILLIN-POT CLAVULANATE 875-125 MG PO TABS: 1.0000 | ORAL_TABLET | Freq: Two times a day (BID) | ORAL | 0 refills | Status: DC

## 2019-05-18 LAB — URINE CULTURE

## 2019-05-21 ENCOUNTER — Telehealth: Payer: Self-pay

## 2019-05-21 ENCOUNTER — Telehealth: Payer: Self-pay | Admitting: Internal Medicine

## 2019-05-21 MED ORDER — NITROFURANTOIN MONOHYD MACRO 100 MG PO CAPS: 100.0000 mg | ORAL_CAPSULE | Freq: Two times a day (BID) | ORAL | 0 refills | Status: DC

## 2019-05-21 NOTE — Telephone Encounter (Signed)
New abx sent to [facility]

## 2019-05-21 NOTE — Telephone Encounter (Signed)
*  Extremly to big for her to swallow*

## 2019-05-21 NOTE — Telephone Encounter (Signed)
Gave ok for orders per Dr. Burns.  

## 2019-05-21 NOTE — Telephone Encounter (Signed)
Pt aware.

## 2019-05-21 NOTE — Telephone Encounter (Signed)
Patient called in stating that the medication Dr prescribed for her UTI are exteremly  to bug for her to swallow wanting to see if Dr could prescribe her another pill that's a little smaller so she can take the medication.     Please call and advise

## 2019-05-21 NOTE — Telephone Encounter (Signed)
Would like to continue PT for 1 to 2 times a week for 4 week

## 2019-05-24 DIAGNOSIS — I6529 Occlusion and stenosis of unspecified carotid artery: Secondary | ICD-10-CM | POA: Diagnosis not present

## 2019-05-24 DIAGNOSIS — I4891 Unspecified atrial fibrillation: Secondary | ICD-10-CM | POA: Diagnosis not present

## 2019-05-24 DIAGNOSIS — Z853 Personal history of malignant neoplasm of breast: Secondary | ICD-10-CM | POA: Diagnosis not present

## 2019-05-24 DIAGNOSIS — M800AXD Age-related osteoporosis with current pathological fracture, other site, subsequent encounter for fracture with routine healing: Secondary | ICD-10-CM | POA: Diagnosis not present

## 2019-05-24 DIAGNOSIS — I110 Hypertensive heart disease with heart failure: Secondary | ICD-10-CM | POA: Diagnosis not present

## 2019-05-24 DIAGNOSIS — M47812 Spondylosis without myelopathy or radiculopathy, cervical region: Secondary | ICD-10-CM | POA: Diagnosis not present

## 2019-05-24 DIAGNOSIS — H9193 Unspecified hearing loss, bilateral: Secondary | ICD-10-CM | POA: Diagnosis not present

## 2019-05-24 DIAGNOSIS — I340 Nonrheumatic mitral (valve) insufficiency: Secondary | ICD-10-CM | POA: Diagnosis not present

## 2019-05-24 DIAGNOSIS — K5730 Diverticulosis of large intestine without perforation or abscess without bleeding: Secondary | ICD-10-CM | POA: Diagnosis not present

## 2019-05-24 DIAGNOSIS — G4700 Insomnia, unspecified: Secondary | ICD-10-CM | POA: Diagnosis not present

## 2019-05-24 DIAGNOSIS — Z7901 Long term (current) use of anticoagulants: Secondary | ICD-10-CM | POA: Diagnosis not present

## 2019-05-24 DIAGNOSIS — H409 Unspecified glaucoma: Secondary | ICD-10-CM | POA: Diagnosis not present

## 2019-05-24 DIAGNOSIS — E785 Hyperlipidemia, unspecified: Secondary | ICD-10-CM | POA: Diagnosis not present

## 2019-05-24 DIAGNOSIS — I5033 Acute on chronic diastolic (congestive) heart failure: Secondary | ICD-10-CM | POA: Diagnosis not present

## 2019-05-24 DIAGNOSIS — K219 Gastro-esophageal reflux disease without esophagitis: Secondary | ICD-10-CM | POA: Diagnosis not present

## 2019-05-24 DIAGNOSIS — M8580 Other specified disorders of bone density and structure, unspecified site: Secondary | ICD-10-CM | POA: Diagnosis not present

## 2019-05-24 DIAGNOSIS — M80011D Age-related osteoporosis with current pathological fracture, right shoulder, subsequent encounter for fracture with routine healing: Secondary | ICD-10-CM | POA: Diagnosis not present

## 2019-05-24 DIAGNOSIS — J449 Chronic obstructive pulmonary disease, unspecified: Secondary | ICD-10-CM | POA: Diagnosis not present

## 2019-05-24 DIAGNOSIS — M8008XD Age-related osteoporosis with current pathological fracture, vertebra(e), subsequent encounter for fracture with routine healing: Secondary | ICD-10-CM | POA: Diagnosis not present

## 2019-05-24 DIAGNOSIS — R7303 Prediabetes: Secondary | ICD-10-CM | POA: Diagnosis not present

## 2019-05-27 DIAGNOSIS — G4700 Insomnia, unspecified: Secondary | ICD-10-CM | POA: Diagnosis not present

## 2019-05-27 DIAGNOSIS — Z7901 Long term (current) use of anticoagulants: Secondary | ICD-10-CM | POA: Diagnosis not present

## 2019-05-27 DIAGNOSIS — M800AXD Age-related osteoporosis with current pathological fracture, other site, subsequent encounter for fracture with routine healing: Secondary | ICD-10-CM | POA: Diagnosis not present

## 2019-05-27 DIAGNOSIS — I6529 Occlusion and stenosis of unspecified carotid artery: Secondary | ICD-10-CM | POA: Diagnosis not present

## 2019-05-27 DIAGNOSIS — J449 Chronic obstructive pulmonary disease, unspecified: Secondary | ICD-10-CM | POA: Diagnosis not present

## 2019-05-27 DIAGNOSIS — I4891 Unspecified atrial fibrillation: Secondary | ICD-10-CM | POA: Diagnosis not present

## 2019-05-27 DIAGNOSIS — M8580 Other specified disorders of bone density and structure, unspecified site: Secondary | ICD-10-CM | POA: Diagnosis not present

## 2019-05-27 DIAGNOSIS — I110 Hypertensive heart disease with heart failure: Secondary | ICD-10-CM | POA: Diagnosis not present

## 2019-05-27 DIAGNOSIS — M47812 Spondylosis without myelopathy or radiculopathy, cervical region: Secondary | ICD-10-CM | POA: Diagnosis not present

## 2019-05-27 DIAGNOSIS — H9193 Unspecified hearing loss, bilateral: Secondary | ICD-10-CM | POA: Diagnosis not present

## 2019-05-27 DIAGNOSIS — M80011D Age-related osteoporosis with current pathological fracture, right shoulder, subsequent encounter for fracture with routine healing: Secondary | ICD-10-CM | POA: Diagnosis not present

## 2019-05-27 DIAGNOSIS — I5033 Acute on chronic diastolic (congestive) heart failure: Secondary | ICD-10-CM | POA: Diagnosis not present

## 2019-05-27 DIAGNOSIS — R7303 Prediabetes: Secondary | ICD-10-CM | POA: Diagnosis not present

## 2019-05-27 DIAGNOSIS — E785 Hyperlipidemia, unspecified: Secondary | ICD-10-CM | POA: Diagnosis not present

## 2019-05-27 DIAGNOSIS — K219 Gastro-esophageal reflux disease without esophagitis: Secondary | ICD-10-CM | POA: Diagnosis not present

## 2019-05-27 DIAGNOSIS — M8008XD Age-related osteoporosis with current pathological fracture, vertebra(e), subsequent encounter for fracture with routine healing: Secondary | ICD-10-CM | POA: Diagnosis not present

## 2019-05-27 DIAGNOSIS — I340 Nonrheumatic mitral (valve) insufficiency: Secondary | ICD-10-CM | POA: Diagnosis not present

## 2019-05-27 DIAGNOSIS — K5730 Diverticulosis of large intestine without perforation or abscess without bleeding: Secondary | ICD-10-CM | POA: Diagnosis not present

## 2019-05-27 DIAGNOSIS — H409 Unspecified glaucoma: Secondary | ICD-10-CM | POA: Diagnosis not present

## 2019-05-27 DIAGNOSIS — Z853 Personal history of malignant neoplasm of breast: Secondary | ICD-10-CM | POA: Diagnosis not present

## 2019-05-29 ENCOUNTER — Other Ambulatory Visit: Payer: Self-pay

## 2019-05-29 DIAGNOSIS — Z853 Personal history of malignant neoplasm of breast: Secondary | ICD-10-CM | POA: Diagnosis not present

## 2019-05-29 DIAGNOSIS — I340 Nonrheumatic mitral (valve) insufficiency: Secondary | ICD-10-CM | POA: Diagnosis not present

## 2019-05-29 DIAGNOSIS — M80011D Age-related osteoporosis with current pathological fracture, right shoulder, subsequent encounter for fracture with routine healing: Secondary | ICD-10-CM | POA: Diagnosis not present

## 2019-05-29 DIAGNOSIS — I5033 Acute on chronic diastolic (congestive) heart failure: Secondary | ICD-10-CM | POA: Diagnosis not present

## 2019-05-29 DIAGNOSIS — J449 Chronic obstructive pulmonary disease, unspecified: Secondary | ICD-10-CM | POA: Diagnosis not present

## 2019-05-29 DIAGNOSIS — R7303 Prediabetes: Secondary | ICD-10-CM | POA: Diagnosis not present

## 2019-05-29 DIAGNOSIS — I4891 Unspecified atrial fibrillation: Secondary | ICD-10-CM | POA: Diagnosis not present

## 2019-05-29 DIAGNOSIS — K219 Gastro-esophageal reflux disease without esophagitis: Secondary | ICD-10-CM | POA: Diagnosis not present

## 2019-05-29 DIAGNOSIS — I6529 Occlusion and stenosis of unspecified carotid artery: Secondary | ICD-10-CM | POA: Diagnosis not present

## 2019-05-29 DIAGNOSIS — K5730 Diverticulosis of large intestine without perforation or abscess without bleeding: Secondary | ICD-10-CM | POA: Diagnosis not present

## 2019-05-29 DIAGNOSIS — M47812 Spondylosis without myelopathy or radiculopathy, cervical region: Secondary | ICD-10-CM | POA: Diagnosis not present

## 2019-05-29 DIAGNOSIS — Z7901 Long term (current) use of anticoagulants: Secondary | ICD-10-CM | POA: Diagnosis not present

## 2019-05-29 DIAGNOSIS — H409 Unspecified glaucoma: Secondary | ICD-10-CM | POA: Diagnosis not present

## 2019-05-29 DIAGNOSIS — M8008XD Age-related osteoporosis with current pathological fracture, vertebra(e), subsequent encounter for fracture with routine healing: Secondary | ICD-10-CM | POA: Diagnosis not present

## 2019-05-29 DIAGNOSIS — H9193 Unspecified hearing loss, bilateral: Secondary | ICD-10-CM | POA: Diagnosis not present

## 2019-05-29 DIAGNOSIS — M800AXD Age-related osteoporosis with current pathological fracture, other site, subsequent encounter for fracture with routine healing: Secondary | ICD-10-CM | POA: Diagnosis not present

## 2019-05-29 DIAGNOSIS — M8580 Other specified disorders of bone density and structure, unspecified site: Secondary | ICD-10-CM | POA: Diagnosis not present

## 2019-05-29 DIAGNOSIS — E785 Hyperlipidemia, unspecified: Secondary | ICD-10-CM | POA: Diagnosis not present

## 2019-05-29 DIAGNOSIS — G4700 Insomnia, unspecified: Secondary | ICD-10-CM | POA: Diagnosis not present

## 2019-05-29 DIAGNOSIS — I110 Hypertensive heart disease with heart failure: Secondary | ICD-10-CM | POA: Diagnosis not present

## 2019-05-29 MED ORDER — METOPROLOL TARTRATE 25 MG PO TABS: ORAL_TABLET | ORAL | 0 refills | Status: DC

## 2019-05-30 ENCOUNTER — Other Ambulatory Visit: Payer: Self-pay | Admitting: Internal Medicine

## 2019-05-31 ENCOUNTER — Telehealth: Payer: Self-pay

## 2019-05-31 DIAGNOSIS — E785 Hyperlipidemia, unspecified: Secondary | ICD-10-CM | POA: Diagnosis not present

## 2019-05-31 DIAGNOSIS — K219 Gastro-esophageal reflux disease without esophagitis: Secondary | ICD-10-CM | POA: Diagnosis not present

## 2019-05-31 DIAGNOSIS — M80011D Age-related osteoporosis with current pathological fracture, right shoulder, subsequent encounter for fracture with routine healing: Secondary | ICD-10-CM | POA: Diagnosis not present

## 2019-05-31 DIAGNOSIS — M800AXD Age-related osteoporosis with current pathological fracture, other site, subsequent encounter for fracture with routine healing: Secondary | ICD-10-CM | POA: Diagnosis not present

## 2019-05-31 DIAGNOSIS — K117 Disturbances of salivary secretion: Secondary | ICD-10-CM | POA: Diagnosis not present

## 2019-05-31 DIAGNOSIS — I951 Orthostatic hypotension: Secondary | ICD-10-CM | POA: Diagnosis not present

## 2019-05-31 DIAGNOSIS — M47812 Spondylosis without myelopathy or radiculopathy, cervical region: Secondary | ICD-10-CM | POA: Diagnosis not present

## 2019-05-31 DIAGNOSIS — M8580 Other specified disorders of bone density and structure, unspecified site: Secondary | ICD-10-CM | POA: Diagnosis not present

## 2019-05-31 DIAGNOSIS — J449 Chronic obstructive pulmonary disease, unspecified: Secondary | ICD-10-CM | POA: Diagnosis not present

## 2019-05-31 DIAGNOSIS — M8008XD Age-related osteoporosis with current pathological fracture, vertebra(e), subsequent encounter for fracture with routine healing: Secondary | ICD-10-CM | POA: Diagnosis not present

## 2019-05-31 DIAGNOSIS — G4700 Insomnia, unspecified: Secondary | ICD-10-CM | POA: Diagnosis not present

## 2019-05-31 DIAGNOSIS — Z853 Personal history of malignant neoplasm of breast: Secondary | ICD-10-CM | POA: Diagnosis not present

## 2019-05-31 DIAGNOSIS — H9193 Unspecified hearing loss, bilateral: Secondary | ICD-10-CM | POA: Diagnosis not present

## 2019-05-31 DIAGNOSIS — R7303 Prediabetes: Secondary | ICD-10-CM | POA: Diagnosis not present

## 2019-05-31 DIAGNOSIS — I110 Hypertensive heart disease with heart failure: Secondary | ICD-10-CM | POA: Diagnosis not present

## 2019-05-31 DIAGNOSIS — K5730 Diverticulosis of large intestine without perforation or abscess without bleeding: Secondary | ICD-10-CM | POA: Diagnosis not present

## 2019-05-31 DIAGNOSIS — I6529 Occlusion and stenosis of unspecified carotid artery: Secondary | ICD-10-CM | POA: Diagnosis not present

## 2019-05-31 DIAGNOSIS — H409 Unspecified glaucoma: Secondary | ICD-10-CM | POA: Diagnosis not present

## 2019-05-31 DIAGNOSIS — I5033 Acute on chronic diastolic (congestive) heart failure: Secondary | ICD-10-CM | POA: Diagnosis not present

## 2019-05-31 DIAGNOSIS — I340 Nonrheumatic mitral (valve) insufficiency: Secondary | ICD-10-CM | POA: Diagnosis not present

## 2019-05-31 DIAGNOSIS — I4891 Unspecified atrial fibrillation: Secondary | ICD-10-CM | POA: Diagnosis not present

## 2019-05-31 DIAGNOSIS — Z7901 Long term (current) use of anticoagulants: Secondary | ICD-10-CM | POA: Diagnosis not present

## 2019-05-31 NOTE — Telephone Encounter (Signed)
New message  Upcoming appt in  07/22/19   1. Which medications need to be refilled? (please list name of each medication and dose if known) metoprolol tartrate (LOPRESSOR) 25 MG tablet  2. Which pharmacy/location (including street and city if local pharmacy) [facility]   3. Do they need a 30 day or 90 day supply? 90 day supply

## 2019-05-31 NOTE — Telephone Encounter (Signed)
Rx sent. Niece is aware.

## 2019-06-03 DIAGNOSIS — K5730 Diverticulosis of large intestine without perforation or abscess without bleeding: Secondary | ICD-10-CM | POA: Diagnosis not present

## 2019-06-03 DIAGNOSIS — H409 Unspecified glaucoma: Secondary | ICD-10-CM | POA: Diagnosis not present

## 2019-06-03 DIAGNOSIS — Z7901 Long term (current) use of anticoagulants: Secondary | ICD-10-CM | POA: Diagnosis not present

## 2019-06-03 DIAGNOSIS — M8580 Other specified disorders of bone density and structure, unspecified site: Secondary | ICD-10-CM | POA: Diagnosis not present

## 2019-06-03 DIAGNOSIS — G4700 Insomnia, unspecified: Secondary | ICD-10-CM | POA: Diagnosis not present

## 2019-06-03 DIAGNOSIS — R7303 Prediabetes: Secondary | ICD-10-CM | POA: Diagnosis not present

## 2019-06-03 DIAGNOSIS — I110 Hypertensive heart disease with heart failure: Secondary | ICD-10-CM | POA: Diagnosis not present

## 2019-06-03 DIAGNOSIS — M47812 Spondylosis without myelopathy or radiculopathy, cervical region: Secondary | ICD-10-CM | POA: Diagnosis not present

## 2019-06-03 DIAGNOSIS — M80011D Age-related osteoporosis with current pathological fracture, right shoulder, subsequent encounter for fracture with routine healing: Secondary | ICD-10-CM | POA: Diagnosis not present

## 2019-06-03 DIAGNOSIS — I6529 Occlusion and stenosis of unspecified carotid artery: Secondary | ICD-10-CM | POA: Diagnosis not present

## 2019-06-03 DIAGNOSIS — I4891 Unspecified atrial fibrillation: Secondary | ICD-10-CM | POA: Diagnosis not present

## 2019-06-03 DIAGNOSIS — H9193 Unspecified hearing loss, bilateral: Secondary | ICD-10-CM | POA: Diagnosis not present

## 2019-06-03 DIAGNOSIS — M8008XD Age-related osteoporosis with current pathological fracture, vertebra(e), subsequent encounter for fracture with routine healing: Secondary | ICD-10-CM | POA: Diagnosis not present

## 2019-06-03 DIAGNOSIS — E785 Hyperlipidemia, unspecified: Secondary | ICD-10-CM | POA: Diagnosis not present

## 2019-06-03 DIAGNOSIS — Z853 Personal history of malignant neoplasm of breast: Secondary | ICD-10-CM | POA: Diagnosis not present

## 2019-06-03 DIAGNOSIS — K219 Gastro-esophageal reflux disease without esophagitis: Secondary | ICD-10-CM | POA: Diagnosis not present

## 2019-06-03 DIAGNOSIS — I5033 Acute on chronic diastolic (congestive) heart failure: Secondary | ICD-10-CM | POA: Diagnosis not present

## 2019-06-03 DIAGNOSIS — M800AXD Age-related osteoporosis with current pathological fracture, other site, subsequent encounter for fracture with routine healing: Secondary | ICD-10-CM | POA: Diagnosis not present

## 2019-06-03 DIAGNOSIS — J449 Chronic obstructive pulmonary disease, unspecified: Secondary | ICD-10-CM | POA: Diagnosis not present

## 2019-06-03 DIAGNOSIS — I340 Nonrheumatic mitral (valve) insufficiency: Secondary | ICD-10-CM | POA: Diagnosis not present

## 2019-06-05 ENCOUNTER — Ambulatory Visit: Payer: Medicare Other | Admitting: Internal Medicine

## 2019-06-05 DIAGNOSIS — M80011D Age-related osteoporosis with current pathological fracture, right shoulder, subsequent encounter for fracture with routine healing: Secondary | ICD-10-CM | POA: Diagnosis not present

## 2019-06-05 DIAGNOSIS — M47812 Spondylosis without myelopathy or radiculopathy, cervical region: Secondary | ICD-10-CM | POA: Diagnosis not present

## 2019-06-05 DIAGNOSIS — J449 Chronic obstructive pulmonary disease, unspecified: Secondary | ICD-10-CM | POA: Diagnosis not present

## 2019-06-05 DIAGNOSIS — I110 Hypertensive heart disease with heart failure: Secondary | ICD-10-CM | POA: Diagnosis not present

## 2019-06-05 DIAGNOSIS — R7303 Prediabetes: Secondary | ICD-10-CM | POA: Diagnosis not present

## 2019-06-05 DIAGNOSIS — I4891 Unspecified atrial fibrillation: Secondary | ICD-10-CM | POA: Diagnosis not present

## 2019-06-05 DIAGNOSIS — M8008XD Age-related osteoporosis with current pathological fracture, vertebra(e), subsequent encounter for fracture with routine healing: Secondary | ICD-10-CM | POA: Diagnosis not present

## 2019-06-05 DIAGNOSIS — M800AXD Age-related osteoporosis with current pathological fracture, other site, subsequent encounter for fracture with routine healing: Secondary | ICD-10-CM | POA: Diagnosis not present

## 2019-06-05 DIAGNOSIS — Z853 Personal history of malignant neoplasm of breast: Secondary | ICD-10-CM | POA: Diagnosis not present

## 2019-06-05 DIAGNOSIS — H409 Unspecified glaucoma: Secondary | ICD-10-CM | POA: Diagnosis not present

## 2019-06-05 DIAGNOSIS — I340 Nonrheumatic mitral (valve) insufficiency: Secondary | ICD-10-CM | POA: Diagnosis not present

## 2019-06-05 DIAGNOSIS — H9193 Unspecified hearing loss, bilateral: Secondary | ICD-10-CM | POA: Diagnosis not present

## 2019-06-05 DIAGNOSIS — M8580 Other specified disorders of bone density and structure, unspecified site: Secondary | ICD-10-CM | POA: Diagnosis not present

## 2019-06-05 DIAGNOSIS — I6529 Occlusion and stenosis of unspecified carotid artery: Secondary | ICD-10-CM | POA: Diagnosis not present

## 2019-06-05 DIAGNOSIS — K219 Gastro-esophageal reflux disease without esophagitis: Secondary | ICD-10-CM | POA: Diagnosis not present

## 2019-06-05 DIAGNOSIS — E785 Hyperlipidemia, unspecified: Secondary | ICD-10-CM | POA: Diagnosis not present

## 2019-06-05 DIAGNOSIS — I5033 Acute on chronic diastolic (congestive) heart failure: Secondary | ICD-10-CM | POA: Diagnosis not present

## 2019-06-05 DIAGNOSIS — K5730 Diverticulosis of large intestine without perforation or abscess without bleeding: Secondary | ICD-10-CM | POA: Diagnosis not present

## 2019-06-05 DIAGNOSIS — Z7901 Long term (current) use of anticoagulants: Secondary | ICD-10-CM | POA: Diagnosis not present

## 2019-06-05 DIAGNOSIS — G4700 Insomnia, unspecified: Secondary | ICD-10-CM | POA: Diagnosis not present

## 2019-06-10 ENCOUNTER — Encounter: Payer: Self-pay | Admitting: Cardiology

## 2019-06-10 DIAGNOSIS — E785 Hyperlipidemia, unspecified: Secondary | ICD-10-CM | POA: Diagnosis not present

## 2019-06-10 DIAGNOSIS — Z853 Personal history of malignant neoplasm of breast: Secondary | ICD-10-CM | POA: Diagnosis not present

## 2019-06-10 DIAGNOSIS — G4700 Insomnia, unspecified: Secondary | ICD-10-CM | POA: Diagnosis not present

## 2019-06-10 DIAGNOSIS — J449 Chronic obstructive pulmonary disease, unspecified: Secondary | ICD-10-CM | POA: Diagnosis not present

## 2019-06-10 DIAGNOSIS — M8580 Other specified disorders of bone density and structure, unspecified site: Secondary | ICD-10-CM | POA: Diagnosis not present

## 2019-06-10 DIAGNOSIS — M80011D Age-related osteoporosis with current pathological fracture, right shoulder, subsequent encounter for fracture with routine healing: Secondary | ICD-10-CM | POA: Diagnosis not present

## 2019-06-10 DIAGNOSIS — M8008XD Age-related osteoporosis with current pathological fracture, vertebra(e), subsequent encounter for fracture with routine healing: Secondary | ICD-10-CM | POA: Diagnosis not present

## 2019-06-10 DIAGNOSIS — I5033 Acute on chronic diastolic (congestive) heart failure: Secondary | ICD-10-CM | POA: Diagnosis not present

## 2019-06-10 DIAGNOSIS — Z7901 Long term (current) use of anticoagulants: Secondary | ICD-10-CM | POA: Diagnosis not present

## 2019-06-10 DIAGNOSIS — I340 Nonrheumatic mitral (valve) insufficiency: Secondary | ICD-10-CM | POA: Diagnosis not present

## 2019-06-10 DIAGNOSIS — I6529 Occlusion and stenosis of unspecified carotid artery: Secondary | ICD-10-CM | POA: Diagnosis not present

## 2019-06-10 DIAGNOSIS — M800AXD Age-related osteoporosis with current pathological fracture, other site, subsequent encounter for fracture with routine healing: Secondary | ICD-10-CM | POA: Diagnosis not present

## 2019-06-10 DIAGNOSIS — H409 Unspecified glaucoma: Secondary | ICD-10-CM | POA: Diagnosis not present

## 2019-06-10 DIAGNOSIS — K219 Gastro-esophageal reflux disease without esophagitis: Secondary | ICD-10-CM | POA: Diagnosis not present

## 2019-06-10 DIAGNOSIS — I4891 Unspecified atrial fibrillation: Secondary | ICD-10-CM | POA: Diagnosis not present

## 2019-06-10 DIAGNOSIS — H9193 Unspecified hearing loss, bilateral: Secondary | ICD-10-CM | POA: Diagnosis not present

## 2019-06-10 DIAGNOSIS — M47812 Spondylosis without myelopathy or radiculopathy, cervical region: Secondary | ICD-10-CM | POA: Diagnosis not present

## 2019-06-10 DIAGNOSIS — K5730 Diverticulosis of large intestine without perforation or abscess without bleeding: Secondary | ICD-10-CM | POA: Diagnosis not present

## 2019-06-10 DIAGNOSIS — R7303 Prediabetes: Secondary | ICD-10-CM | POA: Diagnosis not present

## 2019-06-10 DIAGNOSIS — I110 Hypertensive heart disease with heart failure: Secondary | ICD-10-CM | POA: Diagnosis not present

## 2019-06-10 NOTE — Progress Notes (Signed)
Referring-Stacy Burns, MD Reason for referral-atrial fibrillation  HPI: 86 year old female for evaluation of atrial fibrillation at request of Billey Gosling, MD. Carotid Dopplers July 2018 showed 1 to 39% bilateral stenosis. Echocardiogram November 2020 performed at outside facility showed normal LV function, grade 2 diastolic dysfunction, severe left atrial enlargement, moderate mitral regurgitation, mild mitral stenosis, moderate tricuspid regurgitation.  CTA November 2020 showed no pulmonary embolus, bilateral pleural effusions with patchy groundglass infiltrates/edema.  Patient recently seen at [facility] due to not eating.  Noted to be in atrial fibrillation with rapid ventricular response.  Laboratory showed hemoglobin 13.1, BUN 13, creatinine 0.62.  Patient was given metoprolol and diltiazem with improvement in her symptoms.  Cardiology now asked to evaluate.  Patient is somewhat frail.  She does complain of intermittent weakness.  There is some dyspnea on exertion and pedal edema.  She denies chest pain, palpitations or syncope.  No bleeding.  Note she did fall 3 times back in the fall but not since.  Current Outpatient Medications  Medication Sig Dispense Refill  . azelastine (ASTELIN) 137 MCG/SPRAY nasal spray Place 1 spray into the nose 2 (two) times daily as needed (for allergies). Reported on 07/20/2015    . Biotin (BIOTIN ULTRA STRENGTH) 10 MG CAPS Take 1 capsule by mouth daily.    . furosemide (LASIX) 20 MG tablet TAKE 1 TABLET (20 MG TOTAL) BY MOUTH DAILY. TAKE IN THE MORNING 90 tablet 1  . LORazepam (ATIVAN) 2 MG tablet TAKE 1/2 TABLET BY MOUTH EVERY 8 HOURS AS NEEDED FOR ANXIETY 60 tablet 0  . metoprolol tartrate (LOPRESSOR) 25 MG tablet TAKE ONE TABLET (25 MG DOSE) BY MOUTH 2 (TWO) TIMES DAILY. 180 tablet 0  . pantoprazole (PROTONIX) 40 MG tablet TAKE 1 TABLET BY MOUTH EVERY DAY 90 tablet 1  . potassium chloride SA (KLOR-CON) 20 MEQ tablet Take 1 tablet (20 mEq total) by mouth daily. 90  tablet 1  . sertraline (ZOLOFT) 100 MG tablet Take 1 tablet (100 mg total) by mouth daily. 90 tablet 1  . vitamin B-12 (CYANOCOBALAMIN) 1000 MCG tablet Take 1,000 mcg by mouth daily.      . nitrofurantoin, macrocrystal-monohydrate, (MACROBID) 100 MG capsule Take 1 capsule (100 mg total) by mouth 2 (two) times daily. 14 capsule 0   No current facility-administered medications for this visit.    Allergies  Allergen Reactions  . Demerol [Meperidine] Swelling    Throat swelling   . Meperidine Hcl Swelling    REACTION: THROAT SWELLING  . Morphine     REACTION: SWELLING  . Nitroglycerin     Very low bp     Past Medical History:  Diagnosis Date  . Anxiety   . Atrial fibrillation ([place])   . Breast cancer ([place]) 13 years ago   s/p tamoxifen   . COPD (chronic obstructive pulmonary disease) ([place])   . Depression   . Diverticulosis   . Esophageal stricture   . Gastric outlet obstruction   . GERD (gastroesophageal reflux disease)   . Glaucoma   . Hiatal hernia   . Hyperlipidemia   . Hypertension   . Insomnia   . Internal hemorrhoids   . Osteoarthritis   . Osteoporosis   . Peptic ulcer disease   . Rheumatic heart disease   . Ulcerative colitis     Past Surgical History:  Procedure Laterality Date  . ABDOMINAL HYSTERECTOMY    . BREAST RECONSTRUCTION  2000   right  . BREAST SURGERY  right  . CATARACT EXTRACTION  08/2006 and 10/2006  . HEMORRHOID SURGERY    . HIATAL HERNIA REPAIR N/A 09/03/2012   Procedure: LAPAROSCOPIC REPAIR OF HIATAL HERNIA;  Surgeon: Pedro Earls, MD;  Location: [facility];  Service: General;  Laterality: N/A;  . JOINT REPLACEMENT     right  . LAPAROSCOPIC NISSEN FUNDOPLICATION N/A [id number]   Procedure: LAP REPAIR OF LARGE TYPE III MIXED HIATUS HERNIA WITH NISSEN FUNOPLICATION;  Surgeon: Pedro Earls, MD;  Location: [facility];  Service: General;  Laterality: N/A;  . [place]   right  . TONSILLECTOMY    . TOTAL KNEE ARTHROPLASTY     left     Social History   Socioeconomic History  . Marital status: Widowed    Spouse name: Not on file  . Number of children: 3  . Years of education: Not on file  . Highest education level: Not on file  Occupational History  . Occupation: retired-sales    Employer: RETIRED  Tobacco Use  . Smoking status: Never Smoker  . Smokeless tobacco: Never Used  Substance and Sexual Activity  . Alcohol use: No    Alcohol/week: 0.0 standard drinks  . Drug use: No  . Sexual activity: Never  Other Topics Concern  . Not on file  Social History Narrative  . Not on file   Social Determinants of Health   Financial Resource Strain:   . Difficulty of Paying Living Expenses: Not on file  Food Insecurity:   . Worried About Charity fundraiser in the Last Year: Not on file  . Ran Out of Food in the Last Year: Not on file  Transportation Needs:   . Lack of Transportation (Medical): Not on file  . Lack of Transportation (Non-Medical): Not on file  Physical Activity:   . Days of Exercise per Week: Not on file  . Minutes of Exercise per Session: Not on file  Stress:   . Feeling of Stress : Not on file  Social Connections:   . Frequency of Communication with Friends and Family: Not on file  . Frequency of Social Gatherings with Friends and Family: Not on file  . Attends Religious Services: Not on file  . Active Member of Clubs or Organizations: Not on file  . Attends Archivist Meetings: Not on file  . Marital Status: Not on file  Intimate Partner Violence:   . Fear of Current or Ex-Partner: Not on file  . Emotionally Abused: Not on file  . Physically Abused: Not on file  . Sexually Abused: Not on file    Family History  Problem Relation Age of Onset  . Coronary artery disease Sister        x 2 in 28's  . Coronary artery disease Brother        x 2 in 70's  . Diabetes Other   . Colon cancer Father 2  . Hypertension Other   . Stroke Mother 19  . Crohn's disease Brother   .  Breast cancer Other        Aunt  . Cirrhosis Brother   . Liver disease Son     ROS: Weak but no fevers or chills, productive cough, hemoptysis, dysphasia, odynophagia, melena, hematochezia, dysuria, hematuria, rash, seizure activity, orthopnea, PND, pedal edema, claudication. Remaining systems are negative.  Physical Exam:   Blood pressure 112/66, pulse (!) 117, height 5\' 6"  (1.676 m), weight 129 lb (58.5 kg).  General:  Well developed/frail  in NAD Skin warm/dry Patient not depressed No peripheral clubbing Back-normal HEENT-normal/normal eyelids Neck supple/normal carotid upstroke bilaterally; no bruits; no JVD; no thyromegaly chest - CTA/ normal expansion CV - irregular and tachycardic/normal S1 and S2; no rubs or gallops;  PMI nondisplaced; 2/6 systolic murmur apex. Abdomen -NT/ND, no HSM, no mass, + bowel sounds, no bruit 2+ femoral pulses, no bruits Ext-trace edema, no chords, 2+ DP Neuro-grossly nonfocal  ECG - atrial fibrillation at a rate of 117, normal axis, nonspecific ST changes, low voltage. personally reviewed  A/P  1 atrial fibrillation-patient has atrial fibrillation duration unclear.  However she has had some weakness and dyspnea since September which may have been the time of onset.  Her heart rate is elevated today.  Increase metoprolol to 50 mg twice daily.  In 1 week we will check 24-hour Holter monitor to make sure that she is adequately controlled.  Multiple embolic risk factors.  Continue apixaban but decreased to 2.5 mg twice daily as she weighs less than 60 kg and is older than 80.  Check hemoglobin and renal function.  Long discussion today concerning options for treatment.  We discussed rate control versus rhythm control.  Certainly her symptoms of weakness and dyspnea could be related to atrial fibrillation.  However she is somewhat frail and rate control may be best option.  We will begin with that and reassess in 6 to 8 weeks.  Check TSH.  Discontinue  aspirin.  2 mitral stenosis/mitral regurgitation-patient does have a history of rheumatic fever.  She will need follow-up echoes in the future though given age and overall medical condition doubt surgery would be a good option.  3 hypertension-blood pressure controlled.  However I am increasing metoprolol for rate control.  I will therefore discontinue losartan.  Follow blood pressure and adjust regimen as needed.  4 hyperlipidemia-Per primary care.  5 chronic diastolic congestive heart failure-she has minimal pedal edema on examination.  Likely contribution from valvular heart disease and atrial fibrillation.  Continue Lasix at present dose.  Check potassium and renal function.  Check BNP.  Kirk Ruths, MD

## 2019-06-12 ENCOUNTER — Ambulatory Visit (INDEPENDENT_AMBULATORY_CARE_PROVIDER_SITE_OTHER): Payer: Medicare Other | Admitting: Cardiology

## 2019-06-12 ENCOUNTER — Telehealth: Payer: Self-pay | Admitting: *Deleted

## 2019-06-12 ENCOUNTER — Encounter: Payer: Self-pay | Admitting: Cardiology

## 2019-06-12 ENCOUNTER — Other Ambulatory Visit: Payer: Self-pay

## 2019-06-12 VITALS — BP 112/66 | HR 117 | Ht 66.0 in | Wt 129.0 lb

## 2019-06-12 DIAGNOSIS — I10 Essential (primary) hypertension: Secondary | ICD-10-CM | POA: Diagnosis not present

## 2019-06-12 DIAGNOSIS — I4811 Longstanding persistent atrial fibrillation: Secondary | ICD-10-CM | POA: Diagnosis not present

## 2019-06-12 DIAGNOSIS — I5032 Chronic diastolic (congestive) heart failure: Secondary | ICD-10-CM

## 2019-06-12 MED ORDER — METOPROLOL TARTRATE 50 MG PO TABS: 50.0000 mg | ORAL_TABLET | Freq: Two times a day (BID) | ORAL | 3 refills | Status: DC

## 2019-06-12 MED ORDER — APIXABAN 2.5 MG PO TABS: 2.5000 mg | ORAL_TABLET | Freq: Two times a day (BID) | ORAL | 12 refills | Status: AC

## 2019-06-12 NOTE — Patient Instructions (Addendum)
Medication Instructions:  STOP ASPIRIN  DECREASE ELIQUIS TO 2.5 MG TWICE DAILY= 1/2 OF THE 5 MG TABLET TWICE DAILY  STOP LOSARTAN  INCREASE METOPROLOL TO 50 MG TWICE DAILY= 2 OF THE 25 MG TABLETS TWICE DAILY  *If you need a refill on your cardiac medications before your next appointment, please call your pharmacy*  Lab Work: Your physician recommends that you HAVE LAB WORK TODAY  If you have labs (blood work) drawn today and your tests are completely normal, you will receive your results only by: [name] MyChart Message (if you have MyChart) OR . A paper copy in the mail If you have any lab test that is abnormal or we need to change your treatment, we will call you to review the results.  TESTING: Your physician has recommended that you wear a 24 HOUR holter monitor. Holter monitors are medical devices that record the heart's electrical activity. Doctors most often use these monitors to diagnose arrhythmias. Arrhythmias are problems with the speed or rhythm of the heartbeat. The monitor is a small, portable device. You can wear one while you do your normal daily activities. This is usually used to diagnose what is causing palpitations/syncope (passing out).WILL BE MAILED TO YOUR HOME   Follow-Up: At [facility], you and your health needs are our priority.  As part of our continuing mission to provide you with exceptional heart care, we have created designated Provider Care Teams.  These Care Teams include your primary Cardiologist (physician) and Advanced Practice Providers (APPs -  Physician Assistants and Nurse Practitioners) who all work together to provide you with the care you need, when you need it.  Your next appointment:   6 week(s)  The format for your next appointment:   In Person  Provider:   Kirk Ruths MD

## 2019-06-12 NOTE — Telephone Encounter (Signed)
Patient enrolled for [employer] to mail a 3 day ZIO XT long term holter monitor to her home.  Instructions reviewed briefly as they are included in the monitor kit. 

## 2019-06-13 ENCOUNTER — Telehealth: Payer: Self-pay | Admitting: Cardiology

## 2019-06-13 ENCOUNTER — Telehealth: Payer: Self-pay

## 2019-06-13 ENCOUNTER — Telehealth: Payer: Self-pay | Admitting: *Deleted

## 2019-06-13 DIAGNOSIS — M8008XD Age-related osteoporosis with current pathological fracture, vertebra(e), subsequent encounter for fracture with routine healing: Secondary | ICD-10-CM | POA: Diagnosis not present

## 2019-06-13 DIAGNOSIS — K5730 Diverticulosis of large intestine without perforation or abscess without bleeding: Secondary | ICD-10-CM | POA: Diagnosis not present

## 2019-06-13 DIAGNOSIS — M8580 Other specified disorders of bone density and structure, unspecified site: Secondary | ICD-10-CM | POA: Diagnosis not present

## 2019-06-13 DIAGNOSIS — I6529 Occlusion and stenosis of unspecified carotid artery: Secondary | ICD-10-CM | POA: Diagnosis not present

## 2019-06-13 DIAGNOSIS — K219 Gastro-esophageal reflux disease without esophagitis: Secondary | ICD-10-CM | POA: Diagnosis not present

## 2019-06-13 DIAGNOSIS — I340 Nonrheumatic mitral (valve) insufficiency: Secondary | ICD-10-CM | POA: Diagnosis not present

## 2019-06-13 DIAGNOSIS — H9193 Unspecified hearing loss, bilateral: Secondary | ICD-10-CM | POA: Diagnosis not present

## 2019-06-13 DIAGNOSIS — I5033 Acute on chronic diastolic (congestive) heart failure: Secondary | ICD-10-CM | POA: Diagnosis not present

## 2019-06-13 DIAGNOSIS — M47812 Spondylosis without myelopathy or radiculopathy, cervical region: Secondary | ICD-10-CM | POA: Diagnosis not present

## 2019-06-13 DIAGNOSIS — M80011D Age-related osteoporosis with current pathological fracture, right shoulder, subsequent encounter for fracture with routine healing: Secondary | ICD-10-CM | POA: Diagnosis not present

## 2019-06-13 DIAGNOSIS — M800AXD Age-related osteoporosis with current pathological fracture, other site, subsequent encounter for fracture with routine healing: Secondary | ICD-10-CM | POA: Diagnosis not present

## 2019-06-13 DIAGNOSIS — R7303 Prediabetes: Secondary | ICD-10-CM | POA: Diagnosis not present

## 2019-06-13 DIAGNOSIS — E876 Hypokalemia: Secondary | ICD-10-CM

## 2019-06-13 DIAGNOSIS — H409 Unspecified glaucoma: Secondary | ICD-10-CM | POA: Diagnosis not present

## 2019-06-13 DIAGNOSIS — Z853 Personal history of malignant neoplasm of breast: Secondary | ICD-10-CM | POA: Diagnosis not present

## 2019-06-13 DIAGNOSIS — J449 Chronic obstructive pulmonary disease, unspecified: Secondary | ICD-10-CM | POA: Diagnosis not present

## 2019-06-13 DIAGNOSIS — Z7901 Long term (current) use of anticoagulants: Secondary | ICD-10-CM | POA: Diagnosis not present

## 2019-06-13 DIAGNOSIS — G4700 Insomnia, unspecified: Secondary | ICD-10-CM | POA: Diagnosis not present

## 2019-06-13 DIAGNOSIS — I110 Hypertensive heart disease with heart failure: Secondary | ICD-10-CM | POA: Diagnosis not present

## 2019-06-13 DIAGNOSIS — E785 Hyperlipidemia, unspecified: Secondary | ICD-10-CM | POA: Diagnosis not present

## 2019-06-13 DIAGNOSIS — I4891 Unspecified atrial fibrillation: Secondary | ICD-10-CM | POA: Diagnosis not present

## 2019-06-13 LAB — BASIC METABOLIC PANEL
BUN: 14 mg/dL (ref 7–25)
CO2: 29 mmol/L (ref 20–32)
Calcium: 9.5 mg/dL (ref 8.6–10.4)
Chloride: 102 mmol/L (ref 98–110)
Creat: 0.67 mg/dL (ref 0.60–0.88)
Glucose, Bld: 128 mg/dL — ABNORMAL HIGH (ref 65–99)
Potassium: 3.4 mmol/L — ABNORMAL LOW (ref 3.5–5.3)
Sodium: 141 mmol/L (ref 135–146)

## 2019-06-13 LAB — CBC
HCT: 43 % (ref 35.0–45.0)
Hemoglobin: 14.1 g/dL (ref 11.7–15.5)
MCH: 32.3 pg (ref 27.0–33.0)
MCHC: 32.8 g/dL (ref 32.0–36.0)
MCV: 98.4 fL (ref 80.0–100.0)
MPV: 11.3 fL (ref 7.5–12.5)
Platelets: 238 10*3/uL (ref 140–400)
RBC: 4.37 10*6/uL (ref 3.80–5.10)
RDW: 14.7 % (ref 11.0–15.0)
WBC: 6.6 10*3/uL (ref 3.8–10.8)

## 2019-06-13 LAB — BRAIN NATRIURETIC PEPTIDE: Brain Natriuretic Peptide: 670 pg/mL — ABNORMAL HIGH (ref <100)

## 2019-06-13 LAB — TSH: TSH: 4.2 mIU/L (ref 0.40–4.50)

## 2019-06-13 NOTE — Telephone Encounter (Signed)
Son Coralyn Mark aware of lab results & med change. Repeat BMET ordered - he asked if appt was needed -- will defer to [facility] RN to advise on [place] lab.

## 2019-06-13 NOTE — Telephone Encounter (Signed)
Made in error

## 2019-06-13 NOTE — Telephone Encounter (Signed)
Spoke with pt son, aware no appointment needed. Lab orders mailed to the pt

## 2019-06-13 NOTE — Telephone Encounter (Signed)
   Pt's husband called regarding pt's potassium result.  Please advise

## 2019-06-13 NOTE — Telephone Encounter (Addendum)
Left message for pt to call   ----- Message from Lelon Perla, MD sent at 06/13/2019  7:31 AM EST ----- Change kdur to 40 meq daily; bmet one week Kirk Ruths

## 2019-06-15 ENCOUNTER — Other Ambulatory Visit (INDEPENDENT_AMBULATORY_CARE_PROVIDER_SITE_OTHER): Payer: Medicare Other

## 2019-06-15 DIAGNOSIS — I4811 Longstanding persistent atrial fibrillation: Secondary | ICD-10-CM | POA: Diagnosis not present

## 2019-06-17 ENCOUNTER — Telehealth: Payer: Self-pay | Admitting: *Deleted

## 2019-06-17 DIAGNOSIS — I5032 Chronic diastolic (congestive) heart failure: Secondary | ICD-10-CM

## 2019-06-17 MED ORDER — FUROSEMIDE 20 MG PO TABS: 40.0000 mg | ORAL_TABLET | Freq: Every day | ORAL | 3 refills | Status: AC

## 2019-06-17 NOTE — Telephone Encounter (Signed)
-----   Message from Lelon Perla, MD sent at 06/14/2019  7:28 AM EST ----- Increase lasix to 40 mg daily; bmet one week Kirk Ruths

## 2019-06-17 NOTE — Telephone Encounter (Signed)
Spoke with pt son, aware of medication change. New script sent to the pharmacy already has lab work scheduled for this week.

## 2019-06-17 NOTE — Telephone Encounter (Signed)
See other phone note

## 2019-06-20 ENCOUNTER — Telehealth: Payer: Self-pay | Admitting: Cardiology

## 2019-06-20 DIAGNOSIS — Z7901 Long term (current) use of anticoagulants: Secondary | ICD-10-CM | POA: Diagnosis not present

## 2019-06-20 DIAGNOSIS — K5730 Diverticulosis of large intestine without perforation or abscess without bleeding: Secondary | ICD-10-CM | POA: Diagnosis not present

## 2019-06-20 DIAGNOSIS — M800AXD Age-related osteoporosis with current pathological fracture, other site, subsequent encounter for fracture with routine healing: Secondary | ICD-10-CM | POA: Diagnosis not present

## 2019-06-20 DIAGNOSIS — I110 Hypertensive heart disease with heart failure: Secondary | ICD-10-CM | POA: Diagnosis not present

## 2019-06-20 DIAGNOSIS — K219 Gastro-esophageal reflux disease without esophagitis: Secondary | ICD-10-CM | POA: Diagnosis not present

## 2019-06-20 DIAGNOSIS — I5033 Acute on chronic diastolic (congestive) heart failure: Secondary | ICD-10-CM | POA: Diagnosis not present

## 2019-06-20 DIAGNOSIS — Z853 Personal history of malignant neoplasm of breast: Secondary | ICD-10-CM | POA: Diagnosis not present

## 2019-06-20 DIAGNOSIS — G4700 Insomnia, unspecified: Secondary | ICD-10-CM | POA: Diagnosis not present

## 2019-06-20 DIAGNOSIS — I6529 Occlusion and stenosis of unspecified carotid artery: Secondary | ICD-10-CM | POA: Diagnosis not present

## 2019-06-20 DIAGNOSIS — E785 Hyperlipidemia, unspecified: Secondary | ICD-10-CM | POA: Diagnosis not present

## 2019-06-20 DIAGNOSIS — I340 Nonrheumatic mitral (valve) insufficiency: Secondary | ICD-10-CM | POA: Diagnosis not present

## 2019-06-20 DIAGNOSIS — H9193 Unspecified hearing loss, bilateral: Secondary | ICD-10-CM | POA: Diagnosis not present

## 2019-06-20 DIAGNOSIS — I4891 Unspecified atrial fibrillation: Secondary | ICD-10-CM | POA: Diagnosis not present

## 2019-06-20 DIAGNOSIS — M80011D Age-related osteoporosis with current pathological fracture, right shoulder, subsequent encounter for fracture with routine healing: Secondary | ICD-10-CM | POA: Diagnosis not present

## 2019-06-20 DIAGNOSIS — M47812 Spondylosis without myelopathy or radiculopathy, cervical region: Secondary | ICD-10-CM | POA: Diagnosis not present

## 2019-06-20 DIAGNOSIS — M8580 Other specified disorders of bone density and structure, unspecified site: Secondary | ICD-10-CM | POA: Diagnosis not present

## 2019-06-20 DIAGNOSIS — H409 Unspecified glaucoma: Secondary | ICD-10-CM | POA: Diagnosis not present

## 2019-06-20 DIAGNOSIS — M8008XD Age-related osteoporosis with current pathological fracture, vertebra(e), subsequent encounter for fracture with routine healing: Secondary | ICD-10-CM | POA: Diagnosis not present

## 2019-06-20 DIAGNOSIS — J449 Chronic obstructive pulmonary disease, unspecified: Secondary | ICD-10-CM | POA: Diagnosis not present

## 2019-06-20 DIAGNOSIS — R7303 Prediabetes: Secondary | ICD-10-CM | POA: Diagnosis not present

## 2019-06-20 NOTE — Telephone Encounter (Signed)
Left message for steve, we are aware of presistant atrial fib and at last office visit we increased her metoprolol. She is due to wear a monitor for rate control and medication adjustments will be made based on those results. He is to call back with questions.

## 2019-06-20 NOTE — Telephone Encounter (Signed)
New message  STAT if HR is under 50 or over 120 (normal HR is 60-100 beats per minute)  1) What is your heart rate? 88 - 112 hr   2) Do you have a log of your heart rate readings (document readings)?yes   3) Do you have any other symptoms? No   [name] needs an updated list of medications. Please call [name] at [facility].

## 2019-06-25 ENCOUNTER — Telehealth: Payer: Self-pay

## 2019-06-25 DIAGNOSIS — M800AXD Age-related osteoporosis with current pathological fracture, other site, subsequent encounter for fracture with routine healing: Secondary | ICD-10-CM | POA: Diagnosis not present

## 2019-06-25 DIAGNOSIS — I6529 Occlusion and stenosis of unspecified carotid artery: Secondary | ICD-10-CM | POA: Diagnosis not present

## 2019-06-25 DIAGNOSIS — I340 Nonrheumatic mitral (valve) insufficiency: Secondary | ICD-10-CM | POA: Diagnosis not present

## 2019-06-25 DIAGNOSIS — I110 Hypertensive heart disease with heart failure: Secondary | ICD-10-CM | POA: Diagnosis not present

## 2019-06-25 DIAGNOSIS — H409 Unspecified glaucoma: Secondary | ICD-10-CM | POA: Diagnosis not present

## 2019-06-25 DIAGNOSIS — R7303 Prediabetes: Secondary | ICD-10-CM | POA: Diagnosis not present

## 2019-06-25 DIAGNOSIS — K219 Gastro-esophageal reflux disease without esophagitis: Secondary | ICD-10-CM | POA: Diagnosis not present

## 2019-06-25 DIAGNOSIS — E785 Hyperlipidemia, unspecified: Secondary | ICD-10-CM | POA: Diagnosis not present

## 2019-06-25 DIAGNOSIS — M8008XD Age-related osteoporosis with current pathological fracture, vertebra(e), subsequent encounter for fracture with routine healing: Secondary | ICD-10-CM | POA: Diagnosis not present

## 2019-06-25 DIAGNOSIS — J449 Chronic obstructive pulmonary disease, unspecified: Secondary | ICD-10-CM | POA: Diagnosis not present

## 2019-06-25 DIAGNOSIS — M8580 Other specified disorders of bone density and structure, unspecified site: Secondary | ICD-10-CM | POA: Diagnosis not present

## 2019-06-25 DIAGNOSIS — M80011D Age-related osteoporosis with current pathological fracture, right shoulder, subsequent encounter for fracture with routine healing: Secondary | ICD-10-CM | POA: Diagnosis not present

## 2019-06-25 DIAGNOSIS — I5033 Acute on chronic diastolic (congestive) heart failure: Secondary | ICD-10-CM | POA: Diagnosis not present

## 2019-06-25 DIAGNOSIS — M47812 Spondylosis without myelopathy or radiculopathy, cervical region: Secondary | ICD-10-CM | POA: Diagnosis not present

## 2019-06-25 DIAGNOSIS — Z853 Personal history of malignant neoplasm of breast: Secondary | ICD-10-CM | POA: Diagnosis not present

## 2019-06-25 DIAGNOSIS — G4700 Insomnia, unspecified: Secondary | ICD-10-CM | POA: Diagnosis not present

## 2019-06-25 DIAGNOSIS — Z7901 Long term (current) use of anticoagulants: Secondary | ICD-10-CM | POA: Diagnosis not present

## 2019-06-25 DIAGNOSIS — H9193 Unspecified hearing loss, bilateral: Secondary | ICD-10-CM | POA: Diagnosis not present

## 2019-06-25 DIAGNOSIS — I4891 Unspecified atrial fibrillation: Secondary | ICD-10-CM | POA: Diagnosis not present

## 2019-06-25 DIAGNOSIS — K5730 Diverticulosis of large intestine without perforation or abscess without bleeding: Secondary | ICD-10-CM | POA: Diagnosis not present

## 2019-06-25 NOTE — Telephone Encounter (Signed)
New message    Mickel Baas calling from [facility].    Need an order for Speech evaluation.

## 2019-06-25 NOTE — Telephone Encounter (Signed)
Gave ok for speech therapy per Dr. Quay Burow.

## 2019-06-26 DIAGNOSIS — H409 Unspecified glaucoma: Secondary | ICD-10-CM

## 2019-06-26 DIAGNOSIS — M47812 Spondylosis without myelopathy or radiculopathy, cervical region: Secondary | ICD-10-CM

## 2019-06-26 DIAGNOSIS — I5033 Acute on chronic diastolic (congestive) heart failure: Secondary | ICD-10-CM

## 2019-06-26 DIAGNOSIS — R7303 Prediabetes: Secondary | ICD-10-CM

## 2019-06-26 DIAGNOSIS — H9193 Unspecified hearing loss, bilateral: Secondary | ICD-10-CM

## 2019-06-26 DIAGNOSIS — M8008XD Age-related osteoporosis with current pathological fracture, vertebra(e), subsequent encounter for fracture with routine healing: Secondary | ICD-10-CM | POA: Diagnosis not present

## 2019-06-26 DIAGNOSIS — K219 Gastro-esophageal reflux disease without esophagitis: Secondary | ICD-10-CM

## 2019-06-26 DIAGNOSIS — E785 Hyperlipidemia, unspecified: Secondary | ICD-10-CM

## 2019-06-26 DIAGNOSIS — K5730 Diverticulosis of large intestine without perforation or abscess without bleeding: Secondary | ICD-10-CM

## 2019-06-26 DIAGNOSIS — J449 Chronic obstructive pulmonary disease, unspecified: Secondary | ICD-10-CM

## 2019-06-26 DIAGNOSIS — I110 Hypertensive heart disease with heart failure: Secondary | ICD-10-CM | POA: Diagnosis not present

## 2019-06-26 DIAGNOSIS — I6529 Occlusion and stenosis of unspecified carotid artery: Secondary | ICD-10-CM

## 2019-06-26 DIAGNOSIS — G4700 Insomnia, unspecified: Secondary | ICD-10-CM

## 2019-06-26 DIAGNOSIS — I340 Nonrheumatic mitral (valve) insufficiency: Secondary | ICD-10-CM

## 2019-06-26 DIAGNOSIS — Z7901 Long term (current) use of anticoagulants: Secondary | ICD-10-CM

## 2019-06-26 DIAGNOSIS — Z853 Personal history of malignant neoplasm of breast: Secondary | ICD-10-CM

## 2019-06-26 DIAGNOSIS — M800AXD Age-related osteoporosis with current pathological fracture, other site, subsequent encounter for fracture with routine healing: Secondary | ICD-10-CM | POA: Diagnosis not present

## 2019-06-26 DIAGNOSIS — F329 Major depressive disorder, single episode, unspecified: Secondary | ICD-10-CM

## 2019-06-26 DIAGNOSIS — M80011D Age-related osteoporosis with current pathological fracture, right shoulder, subsequent encounter for fracture with routine healing: Secondary | ICD-10-CM | POA: Diagnosis not present

## 2019-06-26 DIAGNOSIS — F411 Generalized anxiety disorder: Secondary | ICD-10-CM

## 2019-06-26 DIAGNOSIS — F4323 Adjustment disorder with mixed anxiety and depressed mood: Secondary | ICD-10-CM

## 2019-06-26 DIAGNOSIS — I4891 Unspecified atrial fibrillation: Secondary | ICD-10-CM

## 2019-06-26 DIAGNOSIS — M8580 Other specified disorders of bone density and structure, unspecified site: Secondary | ICD-10-CM

## 2019-06-27 DIAGNOSIS — Z7901 Long term (current) use of anticoagulants: Secondary | ICD-10-CM | POA: Diagnosis not present

## 2019-06-27 DIAGNOSIS — I5033 Acute on chronic diastolic (congestive) heart failure: Secondary | ICD-10-CM | POA: Diagnosis not present

## 2019-06-27 DIAGNOSIS — E785 Hyperlipidemia, unspecified: Secondary | ICD-10-CM | POA: Diagnosis not present

## 2019-06-27 DIAGNOSIS — H409 Unspecified glaucoma: Secondary | ICD-10-CM | POA: Diagnosis not present

## 2019-06-27 DIAGNOSIS — Z853 Personal history of malignant neoplasm of breast: Secondary | ICD-10-CM | POA: Diagnosis not present

## 2019-06-27 DIAGNOSIS — M8580 Other specified disorders of bone density and structure, unspecified site: Secondary | ICD-10-CM | POA: Diagnosis not present

## 2019-06-27 DIAGNOSIS — H9193 Unspecified hearing loss, bilateral: Secondary | ICD-10-CM | POA: Diagnosis not present

## 2019-06-27 DIAGNOSIS — K219 Gastro-esophageal reflux disease without esophagitis: Secondary | ICD-10-CM | POA: Diagnosis not present

## 2019-06-27 DIAGNOSIS — M80011D Age-related osteoporosis with current pathological fracture, right shoulder, subsequent encounter for fracture with routine healing: Secondary | ICD-10-CM | POA: Diagnosis not present

## 2019-06-27 DIAGNOSIS — M47812 Spondylosis without myelopathy or radiculopathy, cervical region: Secondary | ICD-10-CM | POA: Diagnosis not present

## 2019-06-27 DIAGNOSIS — J449 Chronic obstructive pulmonary disease, unspecified: Secondary | ICD-10-CM | POA: Diagnosis not present

## 2019-06-27 DIAGNOSIS — I4811 Longstanding persistent atrial fibrillation: Secondary | ICD-10-CM | POA: Diagnosis not present

## 2019-06-27 DIAGNOSIS — I110 Hypertensive heart disease with heart failure: Secondary | ICD-10-CM | POA: Diagnosis not present

## 2019-06-27 DIAGNOSIS — M8008XD Age-related osteoporosis with current pathological fracture, vertebra(e), subsequent encounter for fracture with routine healing: Secondary | ICD-10-CM | POA: Diagnosis not present

## 2019-06-27 DIAGNOSIS — I340 Nonrheumatic mitral (valve) insufficiency: Secondary | ICD-10-CM | POA: Diagnosis not present

## 2019-06-27 DIAGNOSIS — G4700 Insomnia, unspecified: Secondary | ICD-10-CM | POA: Diagnosis not present

## 2019-06-27 DIAGNOSIS — R7303 Prediabetes: Secondary | ICD-10-CM | POA: Diagnosis not present

## 2019-06-27 DIAGNOSIS — I4891 Unspecified atrial fibrillation: Secondary | ICD-10-CM | POA: Diagnosis not present

## 2019-06-27 DIAGNOSIS — K5730 Diverticulosis of large intestine without perforation or abscess without bleeding: Secondary | ICD-10-CM | POA: Diagnosis not present

## 2019-06-27 DIAGNOSIS — M800AXD Age-related osteoporosis with current pathological fracture, other site, subsequent encounter for fracture with routine healing: Secondary | ICD-10-CM | POA: Diagnosis not present

## 2019-06-27 DIAGNOSIS — I6529 Occlusion and stenosis of unspecified carotid artery: Secondary | ICD-10-CM | POA: Diagnosis not present

## 2019-07-01 ENCOUNTER — Telehealth: Payer: Self-pay | Admitting: *Deleted

## 2019-07-01 ENCOUNTER — Telehealth: Payer: Self-pay | Admitting: Internal Medicine

## 2019-07-01 DIAGNOSIS — G4700 Insomnia, unspecified: Secondary | ICD-10-CM | POA: Diagnosis not present

## 2019-07-01 DIAGNOSIS — M80011D Age-related osteoporosis with current pathological fracture, right shoulder, subsequent encounter for fracture with routine healing: Secondary | ICD-10-CM | POA: Diagnosis not present

## 2019-07-01 DIAGNOSIS — M800AXD Age-related osteoporosis with current pathological fracture, other site, subsequent encounter for fracture with routine healing: Secondary | ICD-10-CM | POA: Diagnosis not present

## 2019-07-01 DIAGNOSIS — J449 Chronic obstructive pulmonary disease, unspecified: Secondary | ICD-10-CM | POA: Diagnosis not present

## 2019-07-01 DIAGNOSIS — K5730 Diverticulosis of large intestine without perforation or abscess without bleeding: Secondary | ICD-10-CM | POA: Diagnosis not present

## 2019-07-01 DIAGNOSIS — I110 Hypertensive heart disease with heart failure: Secondary | ICD-10-CM | POA: Diagnosis not present

## 2019-07-01 DIAGNOSIS — I340 Nonrheumatic mitral (valve) insufficiency: Secondary | ICD-10-CM | POA: Diagnosis not present

## 2019-07-01 DIAGNOSIS — Z7901 Long term (current) use of anticoagulants: Secondary | ICD-10-CM | POA: Diagnosis not present

## 2019-07-01 DIAGNOSIS — M47812 Spondylosis without myelopathy or radiculopathy, cervical region: Secondary | ICD-10-CM | POA: Diagnosis not present

## 2019-07-01 DIAGNOSIS — I5033 Acute on chronic diastolic (congestive) heart failure: Secondary | ICD-10-CM | POA: Diagnosis not present

## 2019-07-01 DIAGNOSIS — H9193 Unspecified hearing loss, bilateral: Secondary | ICD-10-CM | POA: Diagnosis not present

## 2019-07-01 DIAGNOSIS — Z853 Personal history of malignant neoplasm of breast: Secondary | ICD-10-CM | POA: Diagnosis not present

## 2019-07-01 DIAGNOSIS — H409 Unspecified glaucoma: Secondary | ICD-10-CM | POA: Diagnosis not present

## 2019-07-01 DIAGNOSIS — R7303 Prediabetes: Secondary | ICD-10-CM | POA: Diagnosis not present

## 2019-07-01 DIAGNOSIS — I4891 Unspecified atrial fibrillation: Secondary | ICD-10-CM | POA: Diagnosis not present

## 2019-07-01 DIAGNOSIS — M8008XD Age-related osteoporosis with current pathological fracture, vertebra(e), subsequent encounter for fracture with routine healing: Secondary | ICD-10-CM | POA: Diagnosis not present

## 2019-07-01 DIAGNOSIS — I4811 Longstanding persistent atrial fibrillation: Secondary | ICD-10-CM

## 2019-07-01 DIAGNOSIS — I6529 Occlusion and stenosis of unspecified carotid artery: Secondary | ICD-10-CM | POA: Diagnosis not present

## 2019-07-01 DIAGNOSIS — K219 Gastro-esophageal reflux disease without esophagitis: Secondary | ICD-10-CM | POA: Diagnosis not present

## 2019-07-01 DIAGNOSIS — M8580 Other specified disorders of bone density and structure, unspecified site: Secondary | ICD-10-CM | POA: Diagnosis not present

## 2019-07-01 DIAGNOSIS — E785 Hyperlipidemia, unspecified: Secondary | ICD-10-CM | POA: Diagnosis not present

## 2019-07-01 MED ORDER — METOPROLOL TARTRATE 50 MG PO TABS: ORAL_TABLET | ORAL | 3 refills | Status: AC

## 2019-07-01 NOTE — Telephone Encounter (Signed)
-----   Message from Lelon Perla, MD sent at 07/01/2019  7:04 AM EDT ----- Increase metoprolol to 75 mg BID Kirk Ruths

## 2019-07-01 NOTE — Telephone Encounter (Signed)
Advised son, verbalized understanding  He will have repeat labs done tomorrow

## 2019-07-01 NOTE — Telephone Encounter (Signed)
New message:   Abigail Butts is calling from [facility] to get verbal orders to have Speech Therapy for 1x for 6 weeks. She also wanting to see if they can get orders for a OT evaluation. She states to please leave a voicemail with orders if she does not answer. Please advise.

## 2019-07-02 DIAGNOSIS — E876 Hypokalemia: Secondary | ICD-10-CM | POA: Diagnosis not present

## 2019-07-02 NOTE — Telephone Encounter (Signed)
LVM giving ok for verbal orders. 

## 2019-07-02 NOTE — Telephone Encounter (Signed)
[place] for [place] and OT

## 2019-07-03 ENCOUNTER — Encounter: Payer: Self-pay | Admitting: *Deleted

## 2019-07-03 DIAGNOSIS — E785 Hyperlipidemia, unspecified: Secondary | ICD-10-CM | POA: Diagnosis not present

## 2019-07-03 DIAGNOSIS — M8008XD Age-related osteoporosis with current pathological fracture, vertebra(e), subsequent encounter for fracture with routine healing: Secondary | ICD-10-CM | POA: Diagnosis not present

## 2019-07-03 DIAGNOSIS — Z853 Personal history of malignant neoplasm of breast: Secondary | ICD-10-CM | POA: Diagnosis not present

## 2019-07-03 DIAGNOSIS — M800AXD Age-related osteoporosis with current pathological fracture, other site, subsequent encounter for fracture with routine healing: Secondary | ICD-10-CM | POA: Diagnosis not present

## 2019-07-03 DIAGNOSIS — I340 Nonrheumatic mitral (valve) insufficiency: Secondary | ICD-10-CM | POA: Diagnosis not present

## 2019-07-03 DIAGNOSIS — I6529 Occlusion and stenosis of unspecified carotid artery: Secondary | ICD-10-CM | POA: Diagnosis not present

## 2019-07-03 DIAGNOSIS — R7303 Prediabetes: Secondary | ICD-10-CM | POA: Diagnosis not present

## 2019-07-03 DIAGNOSIS — I110 Hypertensive heart disease with heart failure: Secondary | ICD-10-CM | POA: Diagnosis not present

## 2019-07-03 DIAGNOSIS — I4891 Unspecified atrial fibrillation: Secondary | ICD-10-CM | POA: Diagnosis not present

## 2019-07-03 DIAGNOSIS — K5730 Diverticulosis of large intestine without perforation or abscess without bleeding: Secondary | ICD-10-CM | POA: Diagnosis not present

## 2019-07-03 DIAGNOSIS — Z7901 Long term (current) use of anticoagulants: Secondary | ICD-10-CM | POA: Diagnosis not present

## 2019-07-03 DIAGNOSIS — H9193 Unspecified hearing loss, bilateral: Secondary | ICD-10-CM | POA: Diagnosis not present

## 2019-07-03 DIAGNOSIS — M8580 Other specified disorders of bone density and structure, unspecified site: Secondary | ICD-10-CM | POA: Diagnosis not present

## 2019-07-03 DIAGNOSIS — H409 Unspecified glaucoma: Secondary | ICD-10-CM | POA: Diagnosis not present

## 2019-07-03 DIAGNOSIS — G4700 Insomnia, unspecified: Secondary | ICD-10-CM | POA: Diagnosis not present

## 2019-07-03 DIAGNOSIS — M80011D Age-related osteoporosis with current pathological fracture, right shoulder, subsequent encounter for fracture with routine healing: Secondary | ICD-10-CM | POA: Diagnosis not present

## 2019-07-03 DIAGNOSIS — J449 Chronic obstructive pulmonary disease, unspecified: Secondary | ICD-10-CM | POA: Diagnosis not present

## 2019-07-03 DIAGNOSIS — M47812 Spondylosis without myelopathy or radiculopathy, cervical region: Secondary | ICD-10-CM | POA: Diagnosis not present

## 2019-07-03 DIAGNOSIS — K219 Gastro-esophageal reflux disease without esophagitis: Secondary | ICD-10-CM | POA: Diagnosis not present

## 2019-07-03 DIAGNOSIS — I5033 Acute on chronic diastolic (congestive) heart failure: Secondary | ICD-10-CM | POA: Diagnosis not present

## 2019-07-03 LAB — BASIC METABOLIC PANEL
BUN/Creatinine Ratio: 16 (calc) (ref 6–22)
BUN: 16 mg/dL (ref 7–25)
CO2: 25 mmol/L (ref 20–32)
Calcium: 9.8 mg/dL (ref 8.6–10.4)
Chloride: 103 mmol/L (ref 98–110)
Creat: 0.99 mg/dL — ABNORMAL HIGH (ref 0.60–0.88)
Glucose, Bld: 151 mg/dL — ABNORMAL HIGH (ref 65–99)
Potassium: 4.2 mmol/L (ref 3.5–5.3)
Sodium: 141 mmol/L (ref 135–146)

## 2019-07-04 ENCOUNTER — Other Ambulatory Visit: Payer: Self-pay | Admitting: Internal Medicine

## 2019-07-08 ENCOUNTER — Telehealth: Payer: Self-pay | Admitting: Internal Medicine

## 2019-07-08 DIAGNOSIS — I4891 Unspecified atrial fibrillation: Secondary | ICD-10-CM

## 2019-07-08 DIAGNOSIS — R5381 Other malaise: Secondary | ICD-10-CM

## 2019-07-08 DIAGNOSIS — K5730 Diverticulosis of large intestine without perforation or abscess without bleeding: Secondary | ICD-10-CM | POA: Diagnosis not present

## 2019-07-08 DIAGNOSIS — G4700 Insomnia, unspecified: Secondary | ICD-10-CM | POA: Diagnosis not present

## 2019-07-08 DIAGNOSIS — I340 Nonrheumatic mitral (valve) insufficiency: Secondary | ICD-10-CM | POA: Diagnosis not present

## 2019-07-08 DIAGNOSIS — M1990 Unspecified osteoarthritis, unspecified site: Secondary | ICD-10-CM

## 2019-07-08 DIAGNOSIS — J449 Chronic obstructive pulmonary disease, unspecified: Secondary | ICD-10-CM | POA: Diagnosis not present

## 2019-07-08 DIAGNOSIS — R0689 Other abnormalities of breathing: Secondary | ICD-10-CM

## 2019-07-08 DIAGNOSIS — M47812 Spondylosis without myelopathy or radiculopathy, cervical region: Secondary | ICD-10-CM | POA: Diagnosis not present

## 2019-07-08 DIAGNOSIS — E785 Hyperlipidemia, unspecified: Secondary | ICD-10-CM | POA: Diagnosis not present

## 2019-07-08 DIAGNOSIS — M80011D Age-related osteoporosis with current pathological fracture, right shoulder, subsequent encounter for fracture with routine healing: Secondary | ICD-10-CM | POA: Diagnosis not present

## 2019-07-08 DIAGNOSIS — I10 Essential (primary) hypertension: Secondary | ICD-10-CM

## 2019-07-08 DIAGNOSIS — M8580 Other specified disorders of bone density and structure, unspecified site: Secondary | ICD-10-CM | POA: Diagnosis not present

## 2019-07-08 DIAGNOSIS — M8008XD Age-related osteoporosis with current pathological fracture, vertebra(e), subsequent encounter for fracture with routine healing: Secondary | ICD-10-CM | POA: Diagnosis not present

## 2019-07-08 DIAGNOSIS — Z853 Personal history of malignant neoplasm of breast: Secondary | ICD-10-CM | POA: Diagnosis not present

## 2019-07-08 DIAGNOSIS — Z7901 Long term (current) use of anticoagulants: Secondary | ICD-10-CM | POA: Diagnosis not present

## 2019-07-08 DIAGNOSIS — I110 Hypertensive heart disease with heart failure: Secondary | ICD-10-CM | POA: Diagnosis not present

## 2019-07-08 DIAGNOSIS — K219 Gastro-esophageal reflux disease without esophagitis: Secondary | ICD-10-CM | POA: Diagnosis not present

## 2019-07-08 DIAGNOSIS — R7303 Prediabetes: Secondary | ICD-10-CM | POA: Diagnosis not present

## 2019-07-08 DIAGNOSIS — M800AXD Age-related osteoporosis with current pathological fracture, other site, subsequent encounter for fracture with routine healing: Secondary | ICD-10-CM | POA: Diagnosis not present

## 2019-07-08 DIAGNOSIS — I5033 Acute on chronic diastolic (congestive) heart failure: Secondary | ICD-10-CM | POA: Diagnosis not present

## 2019-07-08 DIAGNOSIS — I6529 Occlusion and stenosis of unspecified carotid artery: Secondary | ICD-10-CM | POA: Diagnosis not present

## 2019-07-08 DIAGNOSIS — H409 Unspecified glaucoma: Secondary | ICD-10-CM | POA: Diagnosis not present

## 2019-07-08 DIAGNOSIS — H9193 Unspecified hearing loss, bilateral: Secondary | ICD-10-CM | POA: Diagnosis not present

## 2019-07-08 NOTE — Telephone Encounter (Signed)
Noted  

## 2019-07-08 NOTE — Telephone Encounter (Signed)
   Christy from [employer],  requesting order for:  Portable chest xray, diminished breath sounds , BP 100-60 Raised toilet seat appetite stimulate- patient will hardly eat most days H & P needed Med list needed  Fax [phone] Phone [phone]

## 2019-07-08 NOTE — Addendum Note (Signed)
Addended by: Binnie Rail on: 07/08/2019 04:47 PM   Modules accepted: Orders

## 2019-07-08 NOTE — Telephone Encounter (Signed)
Assisting living called and stated patient had a fall this morning.  She doesn't seem to be hurt, The RN is checking the patient over. They will following up tomorrow with CMA about results of that and where to go from here.

## 2019-07-08 NOTE — Telephone Encounter (Signed)
Please advise 

## 2019-07-08 NOTE — Telephone Encounter (Signed)
Orders faxed

## 2019-07-08 NOTE — Telephone Encounter (Signed)
Raised toilet seat and chest x-ray ordered.  If she is having decreased appetite she should be evaluated.  I can order palliative care consult so that they can evaluate her where she is at

## 2019-07-09 DIAGNOSIS — M800AXD Age-related osteoporosis with current pathological fracture, other site, subsequent encounter for fracture with routine healing: Secondary | ICD-10-CM | POA: Diagnosis not present

## 2019-07-09 DIAGNOSIS — J449 Chronic obstructive pulmonary disease, unspecified: Secondary | ICD-10-CM | POA: Diagnosis not present

## 2019-07-09 DIAGNOSIS — I4891 Unspecified atrial fibrillation: Secondary | ICD-10-CM | POA: Diagnosis not present

## 2019-07-09 DIAGNOSIS — K219 Gastro-esophageal reflux disease without esophagitis: Secondary | ICD-10-CM | POA: Diagnosis not present

## 2019-07-09 DIAGNOSIS — H409 Unspecified glaucoma: Secondary | ICD-10-CM | POA: Diagnosis not present

## 2019-07-09 DIAGNOSIS — Z853 Personal history of malignant neoplasm of breast: Secondary | ICD-10-CM | POA: Diagnosis not present

## 2019-07-09 DIAGNOSIS — K5730 Diverticulosis of large intestine without perforation or abscess without bleeding: Secondary | ICD-10-CM | POA: Diagnosis not present

## 2019-07-09 DIAGNOSIS — M47812 Spondylosis without myelopathy or radiculopathy, cervical region: Secondary | ICD-10-CM | POA: Diagnosis not present

## 2019-07-09 DIAGNOSIS — E785 Hyperlipidemia, unspecified: Secondary | ICD-10-CM | POA: Diagnosis not present

## 2019-07-09 DIAGNOSIS — I5033 Acute on chronic diastolic (congestive) heart failure: Secondary | ICD-10-CM | POA: Diagnosis not present

## 2019-07-09 DIAGNOSIS — M8008XD Age-related osteoporosis with current pathological fracture, vertebra(e), subsequent encounter for fracture with routine healing: Secondary | ICD-10-CM | POA: Diagnosis not present

## 2019-07-09 DIAGNOSIS — M80011D Age-related osteoporosis with current pathological fracture, right shoulder, subsequent encounter for fracture with routine healing: Secondary | ICD-10-CM | POA: Diagnosis not present

## 2019-07-09 DIAGNOSIS — I110 Hypertensive heart disease with heart failure: Secondary | ICD-10-CM | POA: Diagnosis not present

## 2019-07-09 DIAGNOSIS — Z7901 Long term (current) use of anticoagulants: Secondary | ICD-10-CM | POA: Diagnosis not present

## 2019-07-09 DIAGNOSIS — M8580 Other specified disorders of bone density and structure, unspecified site: Secondary | ICD-10-CM | POA: Diagnosis not present

## 2019-07-09 DIAGNOSIS — I340 Nonrheumatic mitral (valve) insufficiency: Secondary | ICD-10-CM | POA: Diagnosis not present

## 2019-07-09 DIAGNOSIS — I6529 Occlusion and stenosis of unspecified carotid artery: Secondary | ICD-10-CM | POA: Diagnosis not present

## 2019-07-09 DIAGNOSIS — H9193 Unspecified hearing loss, bilateral: Secondary | ICD-10-CM | POA: Diagnosis not present

## 2019-07-09 DIAGNOSIS — G4700 Insomnia, unspecified: Secondary | ICD-10-CM | POA: Diagnosis not present

## 2019-07-09 DIAGNOSIS — R0989 Other specified symptoms and signs involving the circulatory and respiratory systems: Secondary | ICD-10-CM | POA: Diagnosis not present

## 2019-07-09 DIAGNOSIS — R7303 Prediabetes: Secondary | ICD-10-CM | POA: Diagnosis not present

## 2019-07-09 NOTE — Telephone Encounter (Signed)
   Please return call to son

## 2019-07-09 NOTE — Telephone Encounter (Signed)
Son agreed with palliative care consult. Would like an order put in.

## 2019-07-09 NOTE — Telephone Encounter (Signed)
LVM for son to call back in regards to referral to pallaitive.

## 2019-07-10 ENCOUNTER — Other Ambulatory Visit: Payer: Self-pay | Admitting: Internal Medicine

## 2019-07-10 ENCOUNTER — Encounter: Payer: Self-pay | Admitting: Internal Medicine

## 2019-07-10 DIAGNOSIS — M8580 Other specified disorders of bone density and structure, unspecified site: Secondary | ICD-10-CM | POA: Diagnosis not present

## 2019-07-10 DIAGNOSIS — I4891 Unspecified atrial fibrillation: Secondary | ICD-10-CM | POA: Diagnosis not present

## 2019-07-10 DIAGNOSIS — G4700 Insomnia, unspecified: Secondary | ICD-10-CM | POA: Diagnosis not present

## 2019-07-10 DIAGNOSIS — H9193 Unspecified hearing loss, bilateral: Secondary | ICD-10-CM | POA: Diagnosis not present

## 2019-07-10 DIAGNOSIS — I110 Hypertensive heart disease with heart failure: Secondary | ICD-10-CM | POA: Diagnosis not present

## 2019-07-10 DIAGNOSIS — I6529 Occlusion and stenosis of unspecified carotid artery: Secondary | ICD-10-CM | POA: Diagnosis not present

## 2019-07-10 DIAGNOSIS — E785 Hyperlipidemia, unspecified: Secondary | ICD-10-CM | POA: Diagnosis not present

## 2019-07-10 DIAGNOSIS — Z853 Personal history of malignant neoplasm of breast: Secondary | ICD-10-CM | POA: Diagnosis not present

## 2019-07-10 DIAGNOSIS — I5033 Acute on chronic diastolic (congestive) heart failure: Secondary | ICD-10-CM | POA: Diagnosis not present

## 2019-07-10 DIAGNOSIS — R7303 Prediabetes: Secondary | ICD-10-CM | POA: Diagnosis not present

## 2019-07-10 DIAGNOSIS — M800AXD Age-related osteoporosis with current pathological fracture, other site, subsequent encounter for fracture with routine healing: Secondary | ICD-10-CM | POA: Diagnosis not present

## 2019-07-10 DIAGNOSIS — M80011D Age-related osteoporosis with current pathological fracture, right shoulder, subsequent encounter for fracture with routine healing: Secondary | ICD-10-CM | POA: Diagnosis not present

## 2019-07-10 DIAGNOSIS — I340 Nonrheumatic mitral (valve) insufficiency: Secondary | ICD-10-CM | POA: Diagnosis not present

## 2019-07-10 DIAGNOSIS — M8008XD Age-related osteoporosis with current pathological fracture, vertebra(e), subsequent encounter for fracture with routine healing: Secondary | ICD-10-CM | POA: Diagnosis not present

## 2019-07-10 DIAGNOSIS — Z7901 Long term (current) use of anticoagulants: Secondary | ICD-10-CM | POA: Diagnosis not present

## 2019-07-10 DIAGNOSIS — K5730 Diverticulosis of large intestine without perforation or abscess without bleeding: Secondary | ICD-10-CM | POA: Diagnosis not present

## 2019-07-10 DIAGNOSIS — M47812 Spondylosis without myelopathy or radiculopathy, cervical region: Secondary | ICD-10-CM | POA: Diagnosis not present

## 2019-07-10 DIAGNOSIS — K219 Gastro-esophageal reflux disease without esophagitis: Secondary | ICD-10-CM | POA: Diagnosis not present

## 2019-07-10 DIAGNOSIS — H409 Unspecified glaucoma: Secondary | ICD-10-CM | POA: Diagnosis not present

## 2019-07-10 DIAGNOSIS — J449 Chronic obstructive pulmonary disease, unspecified: Secondary | ICD-10-CM | POA: Diagnosis not present

## 2019-07-10 MED ORDER — AMOXICILLIN-POT CLAVULANATE 875-125 MG PO TABS: 1.0000 | ORAL_TABLET | Freq: Two times a day (BID) | ORAL | 0 refills | Status: DC

## 2019-07-10 MED ORDER — AZITHROMYCIN 250 MG PO TABS: ORAL_TABLET | ORAL | 0 refills | Status: DC

## 2019-07-10 NOTE — Telephone Encounter (Signed)
Son is aware of results below. Will pick up antibiotic and make sure patient takes them.

## 2019-07-10 NOTE — Addendum Note (Signed)
Addended by: Binnie Rail on: 07/10/2019 07:52 AM   Modules accepted: Orders

## 2019-07-10 NOTE — Addendum Note (Signed)
Addended by: Delice Bison E on: 07/10/2019 08:49 AM   Modules accepted: Orders

## 2019-07-10 NOTE — Telephone Encounter (Signed)
Chest xray shows possible pneumonia in her right lower lobe.   Start antibiotics - 2 sent to [facility] -   augmentin and azithromycin.  She should take both -- rx's pending.

## 2019-07-10 NOTE — Telephone Encounter (Signed)
ordered

## 2019-07-11 ENCOUNTER — Telehealth: Payer: Self-pay | Admitting: Internal Medicine

## 2019-07-11 NOTE — Telephone Encounter (Signed)
LVM giving ok for orders.  

## 2019-07-11 NOTE — Telephone Encounter (Signed)
New Message:   Melissa from [facility] is calling to get OT verbal orders for 2x a week for 4 weeks and 1x a week for 2 weeks. Please advise.

## 2019-07-15 DIAGNOSIS — I5033 Acute on chronic diastolic (congestive) heart failure: Secondary | ICD-10-CM | POA: Diagnosis not present

## 2019-07-15 DIAGNOSIS — I110 Hypertensive heart disease with heart failure: Secondary | ICD-10-CM | POA: Diagnosis not present

## 2019-07-15 DIAGNOSIS — R7303 Prediabetes: Secondary | ICD-10-CM | POA: Diagnosis not present

## 2019-07-15 DIAGNOSIS — I4891 Unspecified atrial fibrillation: Secondary | ICD-10-CM | POA: Diagnosis not present

## 2019-07-15 DIAGNOSIS — M80011D Age-related osteoporosis with current pathological fracture, right shoulder, subsequent encounter for fracture with routine healing: Secondary | ICD-10-CM | POA: Diagnosis not present

## 2019-07-15 DIAGNOSIS — M8580 Other specified disorders of bone density and structure, unspecified site: Secondary | ICD-10-CM | POA: Diagnosis not present

## 2019-07-15 DIAGNOSIS — H9193 Unspecified hearing loss, bilateral: Secondary | ICD-10-CM | POA: Diagnosis not present

## 2019-07-15 DIAGNOSIS — M47812 Spondylosis without myelopathy or radiculopathy, cervical region: Secondary | ICD-10-CM | POA: Diagnosis not present

## 2019-07-15 DIAGNOSIS — K5730 Diverticulosis of large intestine without perforation or abscess without bleeding: Secondary | ICD-10-CM | POA: Diagnosis not present

## 2019-07-15 DIAGNOSIS — H409 Unspecified glaucoma: Secondary | ICD-10-CM | POA: Diagnosis not present

## 2019-07-15 DIAGNOSIS — J449 Chronic obstructive pulmonary disease, unspecified: Secondary | ICD-10-CM | POA: Diagnosis not present

## 2019-07-15 DIAGNOSIS — G4700 Insomnia, unspecified: Secondary | ICD-10-CM | POA: Diagnosis not present

## 2019-07-15 DIAGNOSIS — Z7901 Long term (current) use of anticoagulants: Secondary | ICD-10-CM | POA: Diagnosis not present

## 2019-07-15 DIAGNOSIS — I340 Nonrheumatic mitral (valve) insufficiency: Secondary | ICD-10-CM | POA: Diagnosis not present

## 2019-07-15 DIAGNOSIS — M800AXD Age-related osteoporosis with current pathological fracture, other site, subsequent encounter for fracture with routine healing: Secondary | ICD-10-CM | POA: Diagnosis not present

## 2019-07-15 DIAGNOSIS — Z853 Personal history of malignant neoplasm of breast: Secondary | ICD-10-CM | POA: Diagnosis not present

## 2019-07-15 DIAGNOSIS — I6529 Occlusion and stenosis of unspecified carotid artery: Secondary | ICD-10-CM | POA: Diagnosis not present

## 2019-07-15 DIAGNOSIS — M8008XD Age-related osteoporosis with current pathological fracture, vertebra(e), subsequent encounter for fracture with routine healing: Secondary | ICD-10-CM | POA: Diagnosis not present

## 2019-07-15 DIAGNOSIS — K219 Gastro-esophageal reflux disease without esophagitis: Secondary | ICD-10-CM | POA: Diagnosis not present

## 2019-07-15 DIAGNOSIS — E785 Hyperlipidemia, unspecified: Secondary | ICD-10-CM | POA: Diagnosis not present

## 2019-07-16 DIAGNOSIS — M8008XD Age-related osteoporosis with current pathological fracture, vertebra(e), subsequent encounter for fracture with routine healing: Secondary | ICD-10-CM | POA: Diagnosis not present

## 2019-07-16 DIAGNOSIS — K5730 Diverticulosis of large intestine without perforation or abscess without bleeding: Secondary | ICD-10-CM | POA: Diagnosis not present

## 2019-07-16 DIAGNOSIS — I6529 Occlusion and stenosis of unspecified carotid artery: Secondary | ICD-10-CM | POA: Diagnosis not present

## 2019-07-16 DIAGNOSIS — J449 Chronic obstructive pulmonary disease, unspecified: Secondary | ICD-10-CM | POA: Diagnosis not present

## 2019-07-16 DIAGNOSIS — G4700 Insomnia, unspecified: Secondary | ICD-10-CM | POA: Diagnosis not present

## 2019-07-16 DIAGNOSIS — R7303 Prediabetes: Secondary | ICD-10-CM | POA: Diagnosis not present

## 2019-07-16 DIAGNOSIS — E785 Hyperlipidemia, unspecified: Secondary | ICD-10-CM | POA: Diagnosis not present

## 2019-07-16 DIAGNOSIS — I5033 Acute on chronic diastolic (congestive) heart failure: Secondary | ICD-10-CM | POA: Diagnosis not present

## 2019-07-16 DIAGNOSIS — I4891 Unspecified atrial fibrillation: Secondary | ICD-10-CM | POA: Diagnosis not present

## 2019-07-16 DIAGNOSIS — M47812 Spondylosis without myelopathy or radiculopathy, cervical region: Secondary | ICD-10-CM | POA: Diagnosis not present

## 2019-07-16 DIAGNOSIS — I110 Hypertensive heart disease with heart failure: Secondary | ICD-10-CM | POA: Diagnosis not present

## 2019-07-16 DIAGNOSIS — M80011D Age-related osteoporosis with current pathological fracture, right shoulder, subsequent encounter for fracture with routine healing: Secondary | ICD-10-CM | POA: Diagnosis not present

## 2019-07-16 DIAGNOSIS — H9193 Unspecified hearing loss, bilateral: Secondary | ICD-10-CM | POA: Diagnosis not present

## 2019-07-16 DIAGNOSIS — M8580 Other specified disorders of bone density and structure, unspecified site: Secondary | ICD-10-CM | POA: Diagnosis not present

## 2019-07-16 DIAGNOSIS — I340 Nonrheumatic mitral (valve) insufficiency: Secondary | ICD-10-CM | POA: Diagnosis not present

## 2019-07-16 DIAGNOSIS — Z853 Personal history of malignant neoplasm of breast: Secondary | ICD-10-CM | POA: Diagnosis not present

## 2019-07-16 DIAGNOSIS — M800AXD Age-related osteoporosis with current pathological fracture, other site, subsequent encounter for fracture with routine healing: Secondary | ICD-10-CM | POA: Diagnosis not present

## 2019-07-16 DIAGNOSIS — H409 Unspecified glaucoma: Secondary | ICD-10-CM | POA: Diagnosis not present

## 2019-07-16 DIAGNOSIS — K219 Gastro-esophageal reflux disease without esophagitis: Secondary | ICD-10-CM | POA: Diagnosis not present

## 2019-07-16 DIAGNOSIS — Z7901 Long term (current) use of anticoagulants: Secondary | ICD-10-CM | POA: Diagnosis not present

## 2019-07-16 NOTE — Progress Notes (Signed)
Subjective:    Patient ID: [name], female    DOB: 1930/11/22, 86 y.o.   MRN: [id number]  HPI The patient is here for follow up of their chronic medical problems, including Afib, htn, HFpEF, GERD, prediabetes   She is doing OT/PT.  She is still very unsteady and is afraid of falling.  She has significantly decreased appetite, which seems to be related to decreased taste.  She is unsure why her taste is so poor.  She is a very dry mouth.  She did see ENT recently and he advised some sort of rinse to help increase saliva.  She is not using this consistently.  She is not drinking as many fluids as she should.  She is eating minimally because nothing tastes good.  She cannot think of anything that she wants to eat.  This morning she had some yogurt and ginger ale.   She does state some difficulty swallowing things.  She only feels a burning sensation when she has a carbonated drink.  She is taking the pantoprazole daily.  She does have difficulty swallowing some of her pills.  She has been chewing the potassium pills and her son states they get stuck in her teeth.  She has lost a good amount of weight.  Medications and allergies reviewed with patient and updated if appropriate.  Patient Active Problem List   Diagnosis Date Noted  . Acute on chronic diastolic heart failure ([place]) 04/19/2019  . Compression fracture of thoracic vertebra ([place]), T4, 5 04/19/2019  . Diastolic heart failure ([place]) 03/05/2019  . Atrial fibrillation with RVR ([place]) 02/20/2019  . Pneumonia of both lower lobes due to infectious organism 02/20/2019  . Unsp fracture of left pubis, init encntr for closed fracture ([place]) 02/20/2019  . Fall 02/20/2019  . Left rotator cuff tear 02/01/2018  . Insomnia 08/30/2017  . Prediabetes 09/18/2016  . Thyroid nodule 01/19/2016  . Multiple falls 01/20/2015  . Hx MRSA infection 03/05/2013  . Lap Repair Large Hiatal Hernia with Nissen May 2014 09/20/2012  . Cough 09/13/2012  .  Large type III mixed hiatus hernia 08/16/2012  . Carotid arterial disease ([place]) 10/31/2011  . OTH SPEC ALVEOL&PARIETOALVEOL PNEUMONOPATHIES 04/28/2010  . HEMORRHOIDS-INTERNAL 03/01/2010  . GASTRIC OUTLET OBSTRUCTION 02/10/2010  . RHEUMATIC HEART DISEASE 02/02/2010  . ESOPHAGEAL STRICTURE 02/02/2010  . GERD 02/02/2010  . DUODENAL ULCER 02/02/2010  . HIATAL HERNIA 02/02/2010  . DIVERTICULOSIS, COLON 02/02/2010  . OSTEOARTHRITIS 02/02/2010  . Acute pain of left shoulder 10/09/2009  . DECREASED HEARING, BILATERAL 02/06/2008  . Dizziness 06/28/2007  . PEPTIC ULCER DISEASE 06/07/2007  . Hyperlipidemia 12/19/2006  . Anxiety and depression 12/19/2006  . COPD 12/19/2006  . GLAUCOMA 09/04/2006  . HTN (hypertension) 09/04/2006  . Osteoporosis 09/04/2006  . PREMATURE VENTRICULAR CONTRACTIONS 10/06/1994  . MASTECTOMY, RIGHT, HX OF 04/18/1993    Current Outpatient Medications on File Prior to Visit  Medication Sig Dispense Refill  . amoxicillin-clavulanate (AUGMENTIN) 875-125 MG tablet Take 1 tablet by mouth 2 (two) times daily. 20 tablet 0  . apixaban (ELIQUIS) 2.5 MG TABS tablet Take 1 tablet (2.5 mg total) by mouth 2 (two) times daily. 60 tablet 12  . azelastine (ASTELIN) 137 MCG/SPRAY nasal spray Place 1 spray into the nose 2 (two) times daily as needed (for allergies). Reported on 07/20/2015    . Biotin (BIOTIN ULTRA STRENGTH) 10 MG CAPS Take 1 capsule by mouth daily.    . furosemide (LASIX) 20 MG tablet Take 2 tablets (  40 mg total) by mouth daily. Take in the morning 180 tablet 3  . LORazepam (ATIVAN) 2 MG tablet TAKE 1/2 TABLET BY MOUTH EVERY 8 HOURS AS NEEDED FOR ANXIETY 60 tablet 0  . metoprolol tartrate (LOPRESSOR) 50 MG tablet Take 1 and 1/2 tablets twice a day (Patient taking differently: 75 mg. Take 1 tablet BID) 270 tablet 3  . pantoprazole (PROTONIX) 40 MG tablet TAKE 1 TABLET BY MOUTH EVERY DAY 90 tablet 1  . potassium chloride SA (KLOR-CON) 20 MEQ tablet Take 40 mEq by mouth  daily.    . sertraline (ZOLOFT) 100 MG tablet Take 1 tablet (100 mg total) by mouth daily. 90 tablet 1  . vitamin B-12 (CYANOCOBALAMIN) 1000 MCG tablet Take 1,000 mcg by mouth daily.       No current facility-administered medications on file prior to visit.    Past Medical History:  Diagnosis Date  . Anxiety   . Atrial fibrillation ([place])   . Breast cancer ([place]) 13 years ago   s/p tamoxifen   . COPD (chronic obstructive pulmonary disease) ([place])   . Depression   . Diverticulosis   . Esophageal stricture   . Gastric outlet obstruction   . GERD (gastroesophageal reflux disease)   . Glaucoma   . Hiatal hernia   . Hyperlipidemia   . Hypertension   . Insomnia   . Internal hemorrhoids   . Mitral regurgitation and mitral stenosis   . Osteoarthritis   . Osteoporosis   . Peptic ulcer disease   . Rheumatic heart disease   . Ulcerative colitis     Past Surgical History:  Procedure Laterality Date  . ABDOMINAL HYSTERECTOMY    . BREAST RECONSTRUCTION  2000   right  . BREAST SURGERY     right  . CATARACT EXTRACTION  08/2006 and 10/2006  . HEMORRHOID SURGERY    . HIATAL HERNIA REPAIR N/A 09/03/2012   Procedure: LAPAROSCOPIC REPAIR OF HIATAL HERNIA;  Surgeon: Pedro Earls, MD;  Location: [facility];  Service: General;  Laterality: N/A;  . JOINT REPLACEMENT     right  . LAPAROSCOPIC NISSEN FUNDOPLICATION N/A [id number]   Procedure: LAP REPAIR OF LARGE TYPE III MIXED HIATUS HERNIA WITH NISSEN FUNOPLICATION;  Surgeon: Pedro Earls, MD;  Location: [facility];  Service: General;  Laterality: N/A;  . [place]   right  . TONSILLECTOMY    . TOTAL KNEE ARTHROPLASTY     left    Social History   Socioeconomic History  . Marital status: Widowed    Spouse name: Not on file  . Number of children: 3  . Years of education: Not on file  . Highest education level: Not on file  Occupational History  . Occupation: retired-sales    Employer: RETIRED  Tobacco Use  . Smoking status:  Never Smoker  . Smokeless tobacco: Never Used  Substance and Sexual Activity  . Alcohol use: No    Alcohol/week: 0.0 standard drinks  . Drug use: No  . Sexual activity: Never  Other Topics Concern  . Not on file  Social History Narrative  . Not on file   Social Determinants of Health   Financial Resource Strain:   . Difficulty of Paying Living Expenses:   Food Insecurity:   . Worried About Charity fundraiser in the Last Year:   . Arboriculturist in the Last Year:   Transportation Needs:   . Film/video editor (Medical):   [name] Lack  of Transportation (Non-Medical):   Physical Activity:   . Days of Exercise per Week:   . Minutes of Exercise per Session:   Stress:   . Feeling of Stress :   Social Connections:   . Frequency of Communication with Friends and Family:   . Frequency of Social Gatherings with Friends and Family:   . Attends Religious Services:   . Active Member of Clubs or Organizations:   . Attends Archivist Meetings:   [name] Marital Status:     Family History  Problem Relation Age of Onset  . Coronary artery disease Sister        x 2 in 51's  . Coronary artery disease Brother        x 2 in 70's  . Diabetes Other   . Colon cancer Father 67  . Hypertension Other   . Stroke Mother 65  . Crohn's disease Brother   . Breast cancer Other        Aunt  . Cirrhosis Brother   . Liver disease Son     Review of Systems  Constitutional: Negative for chills and fever.  Respiratory: Positive for shortness of breath. Negative for cough and wheezing.   Cardiovascular: Positive for palpitations and leg swelling. Negative for chest pain.  Gastrointestinal: Positive for abdominal pain. Negative for blood in stool (black stool - pepto), constipation and diarrhea.  Neurological: Positive for dizziness and light-headedness. Negative for headaches.  Psychiatric/Behavioral: Positive for dysphoric mood and sleep disturbance. The patient is nervous/anxious.          Objective:   Vitals:   07/17/19 1132  BP: 100/72  Pulse: (!) 57  Resp: 16  Temp: 98.3 F (36.8 C)  SpO2: 96%   BP Readings from Last 3 Encounters:  07/17/19 100/72  06/12/19 112/66  04/22/19 136/80   Wt Readings from Last 3 Encounters:  07/17/19 112 lb (50.8 kg)  06/12/19 129 lb (58.5 kg)  04/22/19 133 lb 12.8 oz (60.7 kg)   Body mass index is 18.08 kg/m.   Physical Exam    Constitutional: Mildly cachectic, fragile elderly female. No distress.  HENT:  Head: Normocephalic and atraumatic.  Neck: Neck supple. No tracheal deviation present. No thyromegaly present.  No cervical lymphadenopathy Cardiovascular: Normal rate, irregular rhythm and normal heart sounds.  No murmur heard. No carotid bruit .  1+ bilateral pitting edema Pulmonary/Chest: Effort normal and breath sounds normal. No respiratory distress. No has no wheezes. No rales.  Skin: Skin is warm and dry. Not diaphoretic.  Psychiatric: Normal mood and affect. Behavior is normal.      Assessment & Plan:    See Problem List for Assessment and Plan of chronic medical problems.    This visit occurred during the SARS-CoV-2 public health emergency.  Safety protocols were in place, including screening questions prior to the visit, additional usage of staff PPE, and extensive cleaning of exam room while observing appropriate contact time as indicated for disinfecting solutions.

## 2019-07-17 ENCOUNTER — Other Ambulatory Visit: Payer: Self-pay

## 2019-07-17 ENCOUNTER — Ambulatory Visit (INDEPENDENT_AMBULATORY_CARE_PROVIDER_SITE_OTHER): Payer: Medicare Other | Admitting: Internal Medicine

## 2019-07-17 ENCOUNTER — Encounter: Payer: Self-pay | Admitting: Physician Assistant

## 2019-07-17 ENCOUNTER — Encounter: Payer: Self-pay | Admitting: Internal Medicine

## 2019-07-17 ENCOUNTER — Telehealth: Payer: Self-pay | Admitting: Internal Medicine

## 2019-07-17 VITALS — BP 100/72 | HR 57 | Temp 98.3°F | Resp 16 | Ht 66.0 in | Wt 112.0 lb

## 2019-07-17 DIAGNOSIS — R634 Abnormal weight loss: Secondary | ICD-10-CM

## 2019-07-17 DIAGNOSIS — I110 Hypertensive heart disease with heart failure: Secondary | ICD-10-CM | POA: Diagnosis not present

## 2019-07-17 DIAGNOSIS — F329 Major depressive disorder, single episode, unspecified: Secondary | ICD-10-CM

## 2019-07-17 DIAGNOSIS — K5730 Diverticulosis of large intestine without perforation or abscess without bleeding: Secondary | ICD-10-CM | POA: Diagnosis not present

## 2019-07-17 DIAGNOSIS — M8580 Other specified disorders of bone density and structure, unspecified site: Secondary | ICD-10-CM | POA: Diagnosis not present

## 2019-07-17 DIAGNOSIS — I4891 Unspecified atrial fibrillation: Secondary | ICD-10-CM

## 2019-07-17 DIAGNOSIS — K219 Gastro-esophageal reflux disease without esophagitis: Secondary | ICD-10-CM

## 2019-07-17 DIAGNOSIS — H409 Unspecified glaucoma: Secondary | ICD-10-CM | POA: Diagnosis not present

## 2019-07-17 DIAGNOSIS — E785 Hyperlipidemia, unspecified: Secondary | ICD-10-CM | POA: Diagnosis not present

## 2019-07-17 DIAGNOSIS — F419 Anxiety disorder, unspecified: Secondary | ICD-10-CM | POA: Diagnosis not present

## 2019-07-17 DIAGNOSIS — G4700 Insomnia, unspecified: Secondary | ICD-10-CM | POA: Diagnosis not present

## 2019-07-17 DIAGNOSIS — H9193 Unspecified hearing loss, bilateral: Secondary | ICD-10-CM | POA: Diagnosis not present

## 2019-07-17 DIAGNOSIS — I10 Essential (primary) hypertension: Secondary | ICD-10-CM

## 2019-07-17 DIAGNOSIS — Z7901 Long term (current) use of anticoagulants: Secondary | ICD-10-CM | POA: Diagnosis not present

## 2019-07-17 DIAGNOSIS — R7303 Prediabetes: Secondary | ICD-10-CM | POA: Diagnosis not present

## 2019-07-17 DIAGNOSIS — R682 Dry mouth, unspecified: Secondary | ICD-10-CM | POA: Insufficient documentation

## 2019-07-17 DIAGNOSIS — I5033 Acute on chronic diastolic (congestive) heart failure: Secondary | ICD-10-CM | POA: Diagnosis not present

## 2019-07-17 DIAGNOSIS — M80011D Age-related osteoporosis with current pathological fracture, right shoulder, subsequent encounter for fracture with routine healing: Secondary | ICD-10-CM | POA: Diagnosis not present

## 2019-07-17 DIAGNOSIS — R1310 Dysphagia, unspecified: Secondary | ICD-10-CM

## 2019-07-17 DIAGNOSIS — M47812 Spondylosis without myelopathy or radiculopathy, cervical region: Secondary | ICD-10-CM | POA: Diagnosis not present

## 2019-07-17 DIAGNOSIS — I340 Nonrheumatic mitral (valve) insufficiency: Secondary | ICD-10-CM | POA: Diagnosis not present

## 2019-07-17 DIAGNOSIS — I6529 Occlusion and stenosis of unspecified carotid artery: Secondary | ICD-10-CM | POA: Diagnosis not present

## 2019-07-17 DIAGNOSIS — Z853 Personal history of malignant neoplasm of breast: Secondary | ICD-10-CM | POA: Diagnosis not present

## 2019-07-17 DIAGNOSIS — M8008XD Age-related osteoporosis with current pathological fracture, vertebra(e), subsequent encounter for fracture with routine healing: Secondary | ICD-10-CM | POA: Diagnosis not present

## 2019-07-17 DIAGNOSIS — M800AXD Age-related osteoporosis with current pathological fracture, other site, subsequent encounter for fracture with routine healing: Secondary | ICD-10-CM | POA: Diagnosis not present

## 2019-07-17 DIAGNOSIS — J449 Chronic obstructive pulmonary disease, unspecified: Secondary | ICD-10-CM | POA: Diagnosis not present

## 2019-07-17 DIAGNOSIS — F32A Depression, unspecified: Secondary | ICD-10-CM

## 2019-07-17 MED ORDER — MIRTAZAPINE 15 MG PO TBDP: 15.0000 mg | ORAL_TABLET | Freq: Every day | ORAL | 5 refills | Status: DC

## 2019-07-17 MED ORDER — FLUCONAZOLE 200 MG PO TABS: 200.0000 mg | ORAL_TABLET | Freq: Every day | ORAL | 0 refills | Status: AC

## 2019-07-17 MED ORDER — POTASSIUM CHLORIDE 20 MEQ/15ML (10%) PO SOLN: 40.0000 meq | Freq: Every day | ORAL | 5 refills | Status: DC

## 2019-07-17 NOTE — Assessment & Plan Note (Signed)
Chronic On metoprolol, eliquis Rate controlled Feels occ palpitations

## 2019-07-17 NOTE — Telephone Encounter (Signed)
Noted.  I will discuss with them about decreasing her dose at her next appointment.  She returns in 2 months.

## 2019-07-17 NOTE — Assessment & Plan Note (Signed)
Chronic BP on low side On metoprolol for Afib

## 2019-07-17 NOTE — Assessment & Plan Note (Signed)
Acute She has a severe dry mouth and decreased taste This is inhibiting her from eating and she has lost weight ?  Oropharyngeal or oral esophageal candidiasis She is currently on antibiotics he has a possible pneumonia-when she completes this 2 weeks of Diflucan 200 mg daily to see if this helps

## 2019-07-17 NOTE — Assessment & Plan Note (Signed)
Has had elevated sugar in the past and recent sugars have been slightly elevated Since she is barely eating I am not so concerned about her sugars Will recheck an A1c at her next visit

## 2019-07-17 NOTE — Patient Instructions (Addendum)
   Medications changes include :     Stop sertraline.  Start mirtazapine at night for sleep/depression and to help increase appetite Change potassium to liquid Start Diflucan after completing antibiotics for possible yeast infection  Your prescription(s) have been submitted to your pharmacy. Please take as directed and contact our office if you believe you are having problem(s) with the medication(s).  A referral was ordered for GI.       Someone will call you to schedule this.    If her weight continues to decrease please call me.   Please followup in 2 months

## 2019-07-17 NOTE — Telephone Encounter (Signed)
Please call her son Coralyn Mark who is with her today.  Please let him know after she completes the antibiotics I would like her to take 2 weeks of an antiyeast pill to make sure that her dry mouth is not secondary to a yeast infection.  This will be 1 pill twice a day for 2 weeks.  I have sent this to [facility].    Please confirm with him - is she taking the lorazepam?

## 2019-07-17 NOTE — Assessment & Plan Note (Signed)
Chronic Currently taking sertraline Still has some anxiety, depression and difficulty sleeping at times Will change sertraline to Remeron to see if this helps promote increased appetite Follow-up in 2 months

## 2019-07-17 NOTE — Telephone Encounter (Signed)
Takes 1/2 pill of lorazepam every night.

## 2019-07-17 NOTE — Assessment & Plan Note (Signed)
Chronic ? Controlled Having some dysphagia Continue protonix  Refer to GI for further evaluation given her history of peptic ulcer disease, esophageal stricture and chronic use of Pepto-Bismol ?  Yeast infection

## 2019-07-18 ENCOUNTER — Telehealth: Payer: Self-pay | Admitting: Internal Medicine

## 2019-07-18 DIAGNOSIS — I110 Hypertensive heart disease with heart failure: Secondary | ICD-10-CM | POA: Diagnosis not present

## 2019-07-18 DIAGNOSIS — M800AXD Age-related osteoporosis with current pathological fracture, other site, subsequent encounter for fracture with routine healing: Secondary | ICD-10-CM | POA: Diagnosis not present

## 2019-07-18 DIAGNOSIS — I6529 Occlusion and stenosis of unspecified carotid artery: Secondary | ICD-10-CM | POA: Diagnosis not present

## 2019-07-18 DIAGNOSIS — I4891 Unspecified atrial fibrillation: Secondary | ICD-10-CM | POA: Diagnosis not present

## 2019-07-18 DIAGNOSIS — M8580 Other specified disorders of bone density and structure, unspecified site: Secondary | ICD-10-CM | POA: Diagnosis not present

## 2019-07-18 DIAGNOSIS — K5730 Diverticulosis of large intestine without perforation or abscess without bleeding: Secondary | ICD-10-CM | POA: Diagnosis not present

## 2019-07-18 DIAGNOSIS — E785 Hyperlipidemia, unspecified: Secondary | ICD-10-CM | POA: Diagnosis not present

## 2019-07-18 DIAGNOSIS — M80011D Age-related osteoporosis with current pathological fracture, right shoulder, subsequent encounter for fracture with routine healing: Secondary | ICD-10-CM | POA: Diagnosis not present

## 2019-07-18 DIAGNOSIS — R7303 Prediabetes: Secondary | ICD-10-CM | POA: Diagnosis not present

## 2019-07-18 DIAGNOSIS — M8008XD Age-related osteoporosis with current pathological fracture, vertebra(e), subsequent encounter for fracture with routine healing: Secondary | ICD-10-CM | POA: Diagnosis not present

## 2019-07-18 DIAGNOSIS — Z853 Personal history of malignant neoplasm of breast: Secondary | ICD-10-CM | POA: Diagnosis not present

## 2019-07-18 DIAGNOSIS — K219 Gastro-esophageal reflux disease without esophagitis: Secondary | ICD-10-CM | POA: Diagnosis not present

## 2019-07-18 DIAGNOSIS — H409 Unspecified glaucoma: Secondary | ICD-10-CM | POA: Diagnosis not present

## 2019-07-18 DIAGNOSIS — I5033 Acute on chronic diastolic (congestive) heart failure: Secondary | ICD-10-CM | POA: Diagnosis not present

## 2019-07-18 DIAGNOSIS — M47812 Spondylosis without myelopathy or radiculopathy, cervical region: Secondary | ICD-10-CM | POA: Diagnosis not present

## 2019-07-18 DIAGNOSIS — J449 Chronic obstructive pulmonary disease, unspecified: Secondary | ICD-10-CM | POA: Diagnosis not present

## 2019-07-18 DIAGNOSIS — Z7901 Long term (current) use of anticoagulants: Secondary | ICD-10-CM | POA: Diagnosis not present

## 2019-07-18 DIAGNOSIS — H9193 Unspecified hearing loss, bilateral: Secondary | ICD-10-CM | POA: Diagnosis not present

## 2019-07-18 DIAGNOSIS — I340 Nonrheumatic mitral (valve) insufficiency: Secondary | ICD-10-CM | POA: Diagnosis not present

## 2019-07-18 DIAGNOSIS — G4700 Insomnia, unspecified: Secondary | ICD-10-CM | POA: Diagnosis not present

## 2019-07-18 MED ORDER — POTASSIUM CHLORIDE 20 MEQ PO PACK: 40.0000 meq | PACK | Freq: Every day | ORAL | 5 refills | Status: AC

## 2019-07-18 NOTE — Telephone Encounter (Signed)
Patient is in independent living and nurses can not help with medication. Patient is incapable of drawing up the potassium by herself. Nurse was asking if there was a sprinkle potassium that can be sent in. Also she is asking about a psych eval since she is completely refusing to eat at this point.

## 2019-07-18 NOTE — Telephone Encounter (Signed)
I sent a new prescription to the pharmacy for a different potassium .   Have them Start the diflucan prescription - I am concerned she has severe thrush which may be causing her loss of taste resulting in her not wanting to eat.  We can consider a psych eval if this does not help.

## 2019-07-18 NOTE — Telephone Encounter (Signed)
° ° °  Ruthie from [employer], states patient unable to swallow amoxicillin-clavulanate (AUGMENTIN) 875-125 MG tablet, also having trouble drawing up potassium chloride 20 MEQ/15ML (10%) SOLN  Please call [phone]

## 2019-07-18 NOTE — Telephone Encounter (Signed)
Stop the augmentin since she did take the zpak.    I am not familiar with the liquid potassium-is that because it is so thick?  Any solution they are aware of?

## 2019-07-18 NOTE — Telephone Encounter (Signed)
Phone call placed to patient to offer to schedule a visit with [facility]. Phone rang, with no answer I left a voicemail for call back. 

## 2019-07-19 DIAGNOSIS — R7303 Prediabetes: Secondary | ICD-10-CM | POA: Diagnosis not present

## 2019-07-19 DIAGNOSIS — J449 Chronic obstructive pulmonary disease, unspecified: Secondary | ICD-10-CM | POA: Diagnosis not present

## 2019-07-19 DIAGNOSIS — M47812 Spondylosis without myelopathy or radiculopathy, cervical region: Secondary | ICD-10-CM | POA: Diagnosis not present

## 2019-07-19 DIAGNOSIS — H409 Unspecified glaucoma: Secondary | ICD-10-CM | POA: Diagnosis not present

## 2019-07-19 DIAGNOSIS — M8008XD Age-related osteoporosis with current pathological fracture, vertebra(e), subsequent encounter for fracture with routine healing: Secondary | ICD-10-CM | POA: Diagnosis not present

## 2019-07-19 DIAGNOSIS — I340 Nonrheumatic mitral (valve) insufficiency: Secondary | ICD-10-CM | POA: Diagnosis not present

## 2019-07-19 DIAGNOSIS — K5730 Diverticulosis of large intestine without perforation or abscess without bleeding: Secondary | ICD-10-CM | POA: Diagnosis not present

## 2019-07-19 DIAGNOSIS — M80011D Age-related osteoporosis with current pathological fracture, right shoulder, subsequent encounter for fracture with routine healing: Secondary | ICD-10-CM | POA: Diagnosis not present

## 2019-07-19 DIAGNOSIS — Z853 Personal history of malignant neoplasm of breast: Secondary | ICD-10-CM | POA: Diagnosis not present

## 2019-07-19 DIAGNOSIS — H9193 Unspecified hearing loss, bilateral: Secondary | ICD-10-CM | POA: Diagnosis not present

## 2019-07-19 DIAGNOSIS — Z7901 Long term (current) use of anticoagulants: Secondary | ICD-10-CM | POA: Diagnosis not present

## 2019-07-19 DIAGNOSIS — M800AXD Age-related osteoporosis with current pathological fracture, other site, subsequent encounter for fracture with routine healing: Secondary | ICD-10-CM | POA: Diagnosis not present

## 2019-07-19 DIAGNOSIS — G4700 Insomnia, unspecified: Secondary | ICD-10-CM | POA: Diagnosis not present

## 2019-07-19 DIAGNOSIS — I5033 Acute on chronic diastolic (congestive) heart failure: Secondary | ICD-10-CM | POA: Diagnosis not present

## 2019-07-19 DIAGNOSIS — I4891 Unspecified atrial fibrillation: Secondary | ICD-10-CM | POA: Diagnosis not present

## 2019-07-19 DIAGNOSIS — K219 Gastro-esophageal reflux disease without esophagitis: Secondary | ICD-10-CM | POA: Diagnosis not present

## 2019-07-19 DIAGNOSIS — M8580 Other specified disorders of bone density and structure, unspecified site: Secondary | ICD-10-CM | POA: Diagnosis not present

## 2019-07-19 DIAGNOSIS — I6529 Occlusion and stenosis of unspecified carotid artery: Secondary | ICD-10-CM | POA: Diagnosis not present

## 2019-07-19 DIAGNOSIS — E785 Hyperlipidemia, unspecified: Secondary | ICD-10-CM | POA: Diagnosis not present

## 2019-07-19 DIAGNOSIS — I110 Hypertensive heart disease with heart failure: Secondary | ICD-10-CM | POA: Diagnosis not present

## 2019-07-20 DIAGNOSIS — R269 Unspecified abnormalities of gait and mobility: Secondary | ICD-10-CM | POA: Diagnosis not present

## 2019-07-20 DIAGNOSIS — J449 Chronic obstructive pulmonary disease, unspecified: Secondary | ICD-10-CM | POA: Diagnosis not present

## 2019-07-20 DIAGNOSIS — Z79899 Other long term (current) drug therapy: Secondary | ICD-10-CM | POA: Diagnosis not present

## 2019-07-20 DIAGNOSIS — S0990XA Unspecified injury of head, initial encounter: Secondary | ICD-10-CM | POA: Diagnosis not present

## 2019-07-20 DIAGNOSIS — S22000S Wedge compression fracture of unspecified thoracic vertebra, sequela: Secondary | ICD-10-CM | POA: Diagnosis not present

## 2019-07-20 DIAGNOSIS — R079 Chest pain, unspecified: Secondary | ICD-10-CM | POA: Diagnosis not present

## 2019-07-20 DIAGNOSIS — R918 Other nonspecific abnormal finding of lung field: Secondary | ICD-10-CM | POA: Diagnosis not present

## 2019-07-20 DIAGNOSIS — S22040S Wedge compression fracture of fourth thoracic vertebra, sequela: Secondary | ICD-10-CM | POA: Diagnosis not present

## 2019-07-20 DIAGNOSIS — K819 Cholecystitis, unspecified: Secondary | ICD-10-CM | POA: Diagnosis not present

## 2019-07-20 DIAGNOSIS — S00212A Abrasion of left eyelid and periocular area, initial encounter: Secondary | ICD-10-CM | POA: Diagnosis not present

## 2019-07-20 DIAGNOSIS — S00211A Abrasion of right eyelid and periocular area, initial encounter: Secondary | ICD-10-CM | POA: Diagnosis not present

## 2019-07-20 DIAGNOSIS — I10 Essential (primary) hypertension: Secondary | ICD-10-CM | POA: Diagnosis not present

## 2019-07-20 DIAGNOSIS — R270 Ataxia, unspecified: Secondary | ICD-10-CM | POA: Diagnosis not present

## 2019-07-20 DIAGNOSIS — I959 Hypotension, unspecified: Secondary | ICD-10-CM | POA: Diagnosis not present

## 2019-07-20 DIAGNOSIS — Z7982 Long term (current) use of aspirin: Secondary | ICD-10-CM | POA: Diagnosis not present

## 2019-07-20 DIAGNOSIS — S22050S Wedge compression fracture of T5-T6 vertebra, sequela: Secondary | ICD-10-CM | POA: Diagnosis not present

## 2019-07-20 DIAGNOSIS — Z043 Encounter for examination and observation following other accident: Secondary | ICD-10-CM | POA: Diagnosis not present

## 2019-07-20 DIAGNOSIS — Z885 Allergy status to narcotic agent status: Secondary | ICD-10-CM | POA: Diagnosis not present

## 2019-07-20 DIAGNOSIS — Z853 Personal history of malignant neoplasm of breast: Secondary | ICD-10-CM | POA: Diagnosis not present

## 2019-07-20 DIAGNOSIS — J9690 Respiratory failure, unspecified, unspecified whether with hypoxia or hypercapnia: Secondary | ICD-10-CM | POA: Diagnosis not present

## 2019-07-20 DIAGNOSIS — Z888 Allergy status to other drugs, medicaments and biological substances status: Secondary | ICD-10-CM | POA: Diagnosis not present

## 2019-07-20 DIAGNOSIS — S22060S Wedge compression fracture of T7-T8 vertebra, sequela: Secondary | ICD-10-CM | POA: Diagnosis not present

## 2019-07-21 DIAGNOSIS — W19XXXA Unspecified fall, initial encounter: Secondary | ICD-10-CM | POA: Diagnosis not present

## 2019-07-21 DIAGNOSIS — J159 Unspecified bacterial pneumonia: Secondary | ICD-10-CM | POA: Diagnosis not present

## 2019-07-21 DIAGNOSIS — R600 Localized edema: Secondary | ICD-10-CM | POA: Diagnosis not present

## 2019-07-21 DIAGNOSIS — I110 Hypertensive heart disease with heart failure: Secondary | ICD-10-CM | POA: Diagnosis not present

## 2019-07-21 DIAGNOSIS — Z885 Allergy status to narcotic agent status: Secondary | ICD-10-CM | POA: Diagnosis not present

## 2019-07-21 DIAGNOSIS — R296 Repeated falls: Secondary | ICD-10-CM | POA: Diagnosis not present

## 2019-07-21 DIAGNOSIS — I4891 Unspecified atrial fibrillation: Secondary | ICD-10-CM | POA: Diagnosis not present

## 2019-07-21 DIAGNOSIS — Z7901 Long term (current) use of anticoagulants: Secondary | ICD-10-CM | POA: Diagnosis not present

## 2019-07-21 DIAGNOSIS — I6782 Cerebral ischemia: Secondary | ICD-10-CM | POA: Diagnosis not present

## 2019-07-21 DIAGNOSIS — Z853 Personal history of malignant neoplasm of breast: Secondary | ICD-10-CM | POA: Diagnosis not present

## 2019-07-21 DIAGNOSIS — K5900 Constipation, unspecified: Secondary | ICD-10-CM | POA: Diagnosis not present

## 2019-07-21 DIAGNOSIS — I5032 Chronic diastolic (congestive) heart failure: Secondary | ICD-10-CM | POA: Diagnosis not present

## 2019-07-21 DIAGNOSIS — K219 Gastro-esophageal reflux disease without esophagitis: Secondary | ICD-10-CM | POA: Diagnosis not present

## 2019-07-21 DIAGNOSIS — Z043 Encounter for examination and observation following other accident: Secondary | ICD-10-CM | POA: Diagnosis not present

## 2019-07-21 DIAGNOSIS — Z452 Encounter for adjustment and management of vascular access device: Secondary | ICD-10-CM | POA: Diagnosis not present

## 2019-07-21 DIAGNOSIS — Z888 Allergy status to other drugs, medicaments and biological substances status: Secondary | ICD-10-CM | POA: Diagnosis not present

## 2019-07-21 DIAGNOSIS — Z743 Need for continuous supervision: Secondary | ICD-10-CM | POA: Diagnosis not present

## 2019-07-21 DIAGNOSIS — R1310 Dysphagia, unspecified: Secondary | ICD-10-CM | POA: Diagnosis not present

## 2019-07-21 DIAGNOSIS — G8911 Acute pain due to trauma: Secondary | ICD-10-CM | POA: Diagnosis not present

## 2019-07-21 DIAGNOSIS — R110 Nausea: Secondary | ICD-10-CM | POA: Diagnosis not present

## 2019-07-21 DIAGNOSIS — I4819 Other persistent atrial fibrillation: Secondary | ICD-10-CM | POA: Diagnosis not present

## 2019-07-21 DIAGNOSIS — R531 Weakness: Secondary | ICD-10-CM | POA: Diagnosis not present

## 2019-07-21 DIAGNOSIS — J690 Pneumonitis due to inhalation of food and vomit: Secondary | ICD-10-CM | POA: Diagnosis not present

## 2019-07-21 DIAGNOSIS — R000 Tachycardia, unspecified: Secondary | ICD-10-CM | POA: Diagnosis not present

## 2019-07-21 DIAGNOSIS — G319 Degenerative disease of nervous system, unspecified: Secondary | ICD-10-CM | POA: Diagnosis not present

## 2019-07-21 DIAGNOSIS — R404 Transient alteration of awareness: Secondary | ICD-10-CM | POA: Diagnosis not present

## 2019-07-21 DIAGNOSIS — R918 Other nonspecific abnormal finding of lung field: Secondary | ICD-10-CM | POA: Diagnosis not present

## 2019-07-21 DIAGNOSIS — R2681 Unsteadiness on feet: Secondary | ICD-10-CM | POA: Diagnosis not present

## 2019-07-21 DIAGNOSIS — R05 Cough: Secondary | ICD-10-CM | POA: Diagnosis not present

## 2019-07-21 DIAGNOSIS — Z96653 Presence of artificial knee joint, bilateral: Secondary | ICD-10-CM | POA: Diagnosis not present

## 2019-07-22 ENCOUNTER — Ambulatory Visit: Payer: Medicare Other | Admitting: Internal Medicine

## 2019-07-22 ENCOUNTER — Telehealth: Payer: Self-pay | Admitting: Internal Medicine

## 2019-07-22 DIAGNOSIS — Z885 Allergy status to narcotic agent status: Secondary | ICD-10-CM | POA: Diagnosis not present

## 2019-07-22 DIAGNOSIS — R1310 Dysphagia, unspecified: Secondary | ICD-10-CM | POA: Diagnosis not present

## 2019-07-22 DIAGNOSIS — J690 Pneumonitis due to inhalation of food and vomit: Secondary | ICD-10-CM | POA: Diagnosis not present

## 2019-07-22 DIAGNOSIS — Z7901 Long term (current) use of anticoagulants: Secondary | ICD-10-CM | POA: Diagnosis not present

## 2019-07-22 DIAGNOSIS — R600 Localized edema: Secondary | ICD-10-CM | POA: Diagnosis not present

## 2019-07-22 DIAGNOSIS — I4819 Other persistent atrial fibrillation: Secondary | ICD-10-CM | POA: Diagnosis not present

## 2019-07-22 DIAGNOSIS — R2681 Unsteadiness on feet: Secondary | ICD-10-CM | POA: Diagnosis not present

## 2019-07-22 DIAGNOSIS — J159 Unspecified bacterial pneumonia: Secondary | ICD-10-CM | POA: Diagnosis not present

## 2019-07-22 DIAGNOSIS — R531 Weakness: Secondary | ICD-10-CM | POA: Diagnosis not present

## 2019-07-22 DIAGNOSIS — I5032 Chronic diastolic (congestive) heart failure: Secondary | ICD-10-CM | POA: Diagnosis not present

## 2019-07-22 DIAGNOSIS — K219 Gastro-esophageal reflux disease without esophagitis: Secondary | ICD-10-CM | POA: Diagnosis not present

## 2019-07-22 DIAGNOSIS — R0602 Shortness of breath: Secondary | ICD-10-CM | POA: Diagnosis not present

## 2019-07-22 DIAGNOSIS — K5900 Constipation, unspecified: Secondary | ICD-10-CM | POA: Diagnosis not present

## 2019-07-22 DIAGNOSIS — J189 Pneumonia, unspecified organism: Secondary | ICD-10-CM | POA: Diagnosis not present

## 2019-07-22 DIAGNOSIS — R410 Disorientation, unspecified: Secondary | ICD-10-CM | POA: Diagnosis not present

## 2019-07-22 DIAGNOSIS — I110 Hypertensive heart disease with heart failure: Secondary | ICD-10-CM | POA: Diagnosis not present

## 2019-07-22 DIAGNOSIS — Z96653 Presence of artificial knee joint, bilateral: Secondary | ICD-10-CM | POA: Diagnosis not present

## 2019-07-22 DIAGNOSIS — R000 Tachycardia, unspecified: Secondary | ICD-10-CM | POA: Diagnosis not present

## 2019-07-22 DIAGNOSIS — Z888 Allergy status to other drugs, medicaments and biological substances status: Secondary | ICD-10-CM | POA: Diagnosis not present

## 2019-07-22 DIAGNOSIS — I4891 Unspecified atrial fibrillation: Secondary | ICD-10-CM | POA: Diagnosis not present

## 2019-07-22 DIAGNOSIS — Z853 Personal history of malignant neoplasm of breast: Secondary | ICD-10-CM | POA: Diagnosis not present

## 2019-07-22 DIAGNOSIS — R110 Nausea: Secondary | ICD-10-CM | POA: Diagnosis not present

## 2019-07-22 DIAGNOSIS — R296 Repeated falls: Secondary | ICD-10-CM | POA: Diagnosis not present

## 2019-07-22 MED ORDER — PANTOPRAZOLE SODIUM 40 MG PO TBEC
40.00 | DELAYED_RELEASE_TABLET | ORAL | Status: DC
Start: 2019-07-23 — End: 2019-07-22

## 2019-07-22 MED ORDER — SERTRALINE HCL 100 MG PO TABS
100.00 | ORAL_TABLET | ORAL | Status: DC
Start: 2019-07-23 — End: 2019-07-22

## 2019-07-22 MED ORDER — POLYETHYLENE GLYCOL 3350 17 GM/SCOOP PO POWD
17.00 | ORAL | Status: DC
Start: ? — End: 2019-07-22

## 2019-07-22 MED ORDER — METOPROLOL TARTRATE 25 MG PO TABS
12.50 | ORAL_TABLET | ORAL | Status: DC
Start: 2019-07-22 — End: 2019-07-22

## 2019-07-22 MED ORDER — APIXABAN 2.5 MG PO TABS
2.50 | ORAL_TABLET | ORAL | Status: DC
Start: 2019-07-22 — End: 2019-07-22

## 2019-07-22 MED ORDER — GENERIC EXTERNAL MEDICATION
Status: DC
Start: ? — End: 2019-07-22

## 2019-07-22 MED ORDER — GENERIC EXTERNAL MEDICATION
1.00 | Status: DC
Start: 2019-07-22 — End: 2019-07-22

## 2019-07-22 MED ORDER — SODIUM CHLORIDE 0.9 % IV SOLN
10.00 | INTRAVENOUS | Status: DC
Start: ? — End: 2019-07-22

## 2019-07-22 MED ORDER — HYDRALAZINE HCL 20 MG/ML IJ SOLN
10.00 | INTRAMUSCULAR | Status: DC
Start: ? — End: 2019-07-22

## 2019-07-22 MED ORDER — IPRATROPIUM-ALBUTEROL 0.5-2.5 (3) MG/3ML IN SOLN
3.00 | RESPIRATORY_TRACT | Status: DC
Start: ? — End: 2019-07-22

## 2019-07-22 MED ORDER — GENERIC EXTERNAL MEDICATION
100.00 | Status: DC
Start: 2019-07-22 — End: 2019-07-22

## 2019-07-22 MED ORDER — ASPIRIN 81 MG PO TBEC
81.00 | DELAYED_RELEASE_TABLET | ORAL | Status: DC
Start: 2019-07-23 — End: 2019-07-22

## 2019-07-22 NOTE — Telephone Encounter (Signed)
New message:   Just FYI.... Juliann Pulse from [employer] is calliing and states the patient has fallen 5x over the weekend and is now at [facility].

## 2019-07-22 NOTE — Telephone Encounter (Signed)
noted 

## 2019-07-23 DIAGNOSIS — R1310 Dysphagia, unspecified: Secondary | ICD-10-CM | POA: Diagnosis not present

## 2019-07-23 DIAGNOSIS — R110 Nausea: Secondary | ICD-10-CM | POA: Diagnosis not present

## 2019-07-23 DIAGNOSIS — Z7901 Long term (current) use of anticoagulants: Secondary | ICD-10-CM | POA: Diagnosis not present

## 2019-07-23 DIAGNOSIS — R600 Localized edema: Secondary | ICD-10-CM | POA: Diagnosis not present

## 2019-07-23 DIAGNOSIS — E639 Nutritional deficiency, unspecified: Secondary | ICD-10-CM | POA: Diagnosis not present

## 2019-07-23 DIAGNOSIS — Z853 Personal history of malignant neoplasm of breast: Secondary | ICD-10-CM | POA: Diagnosis not present

## 2019-07-23 DIAGNOSIS — I4891 Unspecified atrial fibrillation: Secondary | ICD-10-CM | POA: Diagnosis not present

## 2019-07-23 DIAGNOSIS — J159 Unspecified bacterial pneumonia: Secondary | ICD-10-CM | POA: Diagnosis not present

## 2019-07-23 DIAGNOSIS — R531 Weakness: Secondary | ICD-10-CM | POA: Diagnosis not present

## 2019-07-23 DIAGNOSIS — R296 Repeated falls: Secondary | ICD-10-CM | POA: Diagnosis not present

## 2019-07-23 DIAGNOSIS — R000 Tachycardia, unspecified: Secondary | ICD-10-CM | POA: Diagnosis not present

## 2019-07-23 DIAGNOSIS — I4819 Other persistent atrial fibrillation: Secondary | ICD-10-CM | POA: Diagnosis not present

## 2019-07-23 DIAGNOSIS — I110 Hypertensive heart disease with heart failure: Secondary | ICD-10-CM | POA: Diagnosis not present

## 2019-07-23 DIAGNOSIS — K5900 Constipation, unspecified: Secondary | ICD-10-CM | POA: Diagnosis not present

## 2019-07-23 DIAGNOSIS — J690 Pneumonitis due to inhalation of food and vomit: Secondary | ICD-10-CM | POA: Diagnosis not present

## 2019-07-23 DIAGNOSIS — Z7189 Other specified counseling: Secondary | ICD-10-CM | POA: Diagnosis not present

## 2019-07-23 DIAGNOSIS — Z96653 Presence of artificial knee joint, bilateral: Secondary | ICD-10-CM | POA: Diagnosis not present

## 2019-07-23 DIAGNOSIS — Z66 Do not resuscitate: Secondary | ICD-10-CM | POA: Diagnosis not present

## 2019-07-23 DIAGNOSIS — Z888 Allergy status to other drugs, medicaments and biological substances status: Secondary | ICD-10-CM | POA: Diagnosis not present

## 2019-07-23 DIAGNOSIS — Z885 Allergy status to narcotic agent status: Secondary | ICD-10-CM | POA: Diagnosis not present

## 2019-07-23 DIAGNOSIS — M6281 Muscle weakness (generalized): Secondary | ICD-10-CM | POA: Diagnosis not present

## 2019-07-23 DIAGNOSIS — I5032 Chronic diastolic (congestive) heart failure: Secondary | ICD-10-CM | POA: Diagnosis not present

## 2019-07-23 DIAGNOSIS — Z515 Encounter for palliative care: Secondary | ICD-10-CM | POA: Diagnosis not present

## 2019-07-23 DIAGNOSIS — R05 Cough: Secondary | ICD-10-CM | POA: Diagnosis not present

## 2019-07-23 DIAGNOSIS — R2681 Unsteadiness on feet: Secondary | ICD-10-CM | POA: Diagnosis not present

## 2019-07-23 DIAGNOSIS — K219 Gastro-esophageal reflux disease without esophagitis: Secondary | ICD-10-CM | POA: Diagnosis not present

## 2019-07-23 NOTE — Telephone Encounter (Signed)
Pt is now currently in the hospital.

## 2019-07-24 DIAGNOSIS — I4819 Other persistent atrial fibrillation: Secondary | ICD-10-CM | POA: Diagnosis not present

## 2019-07-24 DIAGNOSIS — R1310 Dysphagia, unspecified: Secondary | ICD-10-CM | POA: Diagnosis not present

## 2019-07-24 DIAGNOSIS — Z7901 Long term (current) use of anticoagulants: Secondary | ICD-10-CM | POA: Diagnosis not present

## 2019-07-24 DIAGNOSIS — Z96653 Presence of artificial knee joint, bilateral: Secondary | ICD-10-CM | POA: Diagnosis not present

## 2019-07-24 DIAGNOSIS — R000 Tachycardia, unspecified: Secondary | ICD-10-CM | POA: Diagnosis not present

## 2019-07-24 DIAGNOSIS — E639 Nutritional deficiency, unspecified: Secondary | ICD-10-CM | POA: Diagnosis not present

## 2019-07-24 DIAGNOSIS — K219 Gastro-esophageal reflux disease without esophagitis: Secondary | ICD-10-CM | POA: Diagnosis not present

## 2019-07-24 DIAGNOSIS — R110 Nausea: Secondary | ICD-10-CM | POA: Diagnosis not present

## 2019-07-24 DIAGNOSIS — R531 Weakness: Secondary | ICD-10-CM | POA: Diagnosis not present

## 2019-07-24 DIAGNOSIS — Z885 Allergy status to narcotic agent status: Secondary | ICD-10-CM | POA: Diagnosis not present

## 2019-07-24 DIAGNOSIS — J159 Unspecified bacterial pneumonia: Secondary | ICD-10-CM | POA: Diagnosis not present

## 2019-07-24 DIAGNOSIS — J690 Pneumonitis due to inhalation of food and vomit: Secondary | ICD-10-CM | POA: Diagnosis not present

## 2019-07-24 DIAGNOSIS — K5900 Constipation, unspecified: Secondary | ICD-10-CM | POA: Diagnosis not present

## 2019-07-24 DIAGNOSIS — I5032 Chronic diastolic (congestive) heart failure: Secondary | ICD-10-CM | POA: Diagnosis not present

## 2019-07-24 DIAGNOSIS — I4891 Unspecified atrial fibrillation: Secondary | ICD-10-CM | POA: Diagnosis not present

## 2019-07-24 DIAGNOSIS — Z853 Personal history of malignant neoplasm of breast: Secondary | ICD-10-CM | POA: Diagnosis not present

## 2019-07-24 DIAGNOSIS — R296 Repeated falls: Secondary | ICD-10-CM | POA: Diagnosis not present

## 2019-07-24 DIAGNOSIS — I110 Hypertensive heart disease with heart failure: Secondary | ICD-10-CM | POA: Diagnosis not present

## 2019-07-24 DIAGNOSIS — M6281 Muscle weakness (generalized): Secondary | ICD-10-CM | POA: Diagnosis not present

## 2019-07-24 DIAGNOSIS — R600 Localized edema: Secondary | ICD-10-CM | POA: Diagnosis not present

## 2019-07-24 DIAGNOSIS — Z888 Allergy status to other drugs, medicaments and biological substances status: Secondary | ICD-10-CM | POA: Diagnosis not present

## 2019-07-24 DIAGNOSIS — Z66 Do not resuscitate: Secondary | ICD-10-CM | POA: Diagnosis not present

## 2019-07-24 DIAGNOSIS — Z7189 Other specified counseling: Secondary | ICD-10-CM | POA: Diagnosis not present

## 2019-07-24 DIAGNOSIS — R2681 Unsteadiness on feet: Secondary | ICD-10-CM | POA: Diagnosis not present

## 2019-07-25 DIAGNOSIS — J690 Pneumonitis due to inhalation of food and vomit: Secondary | ICD-10-CM | POA: Diagnosis not present

## 2019-07-25 DIAGNOSIS — R531 Weakness: Secondary | ICD-10-CM | POA: Diagnosis not present

## 2019-07-25 DIAGNOSIS — Z885 Allergy status to narcotic agent status: Secondary | ICD-10-CM | POA: Diagnosis not present

## 2019-07-25 DIAGNOSIS — I4819 Other persistent atrial fibrillation: Secondary | ICD-10-CM | POA: Diagnosis not present

## 2019-07-25 DIAGNOSIS — R110 Nausea: Secondary | ICD-10-CM | POA: Diagnosis not present

## 2019-07-25 DIAGNOSIS — Z7901 Long term (current) use of anticoagulants: Secondary | ICD-10-CM | POA: Diagnosis not present

## 2019-07-25 DIAGNOSIS — Z853 Personal history of malignant neoplasm of breast: Secondary | ICD-10-CM | POA: Diagnosis not present

## 2019-07-25 DIAGNOSIS — Z96653 Presence of artificial knee joint, bilateral: Secondary | ICD-10-CM | POA: Diagnosis not present

## 2019-07-25 DIAGNOSIS — Z888 Allergy status to other drugs, medicaments and biological substances status: Secondary | ICD-10-CM | POA: Diagnosis not present

## 2019-07-25 DIAGNOSIS — R000 Tachycardia, unspecified: Secondary | ICD-10-CM | POA: Diagnosis not present

## 2019-07-25 DIAGNOSIS — R1310 Dysphagia, unspecified: Secondary | ICD-10-CM | POA: Diagnosis not present

## 2019-07-25 DIAGNOSIS — R296 Repeated falls: Secondary | ICD-10-CM | POA: Diagnosis not present

## 2019-07-25 DIAGNOSIS — K5900 Constipation, unspecified: Secondary | ICD-10-CM | POA: Diagnosis not present

## 2019-07-25 DIAGNOSIS — I110 Hypertensive heart disease with heart failure: Secondary | ICD-10-CM | POA: Diagnosis not present

## 2019-07-25 DIAGNOSIS — R600 Localized edema: Secondary | ICD-10-CM | POA: Diagnosis not present

## 2019-07-25 DIAGNOSIS — R2681 Unsteadiness on feet: Secondary | ICD-10-CM | POA: Diagnosis not present

## 2019-07-25 DIAGNOSIS — K219 Gastro-esophageal reflux disease without esophagitis: Secondary | ICD-10-CM | POA: Diagnosis not present

## 2019-07-25 DIAGNOSIS — I5032 Chronic diastolic (congestive) heart failure: Secondary | ICD-10-CM | POA: Diagnosis not present

## 2019-07-26 DIAGNOSIS — E538 Deficiency of other specified B group vitamins: Secondary | ICD-10-CM | POA: Diagnosis not present

## 2019-07-26 DIAGNOSIS — Z888 Allergy status to other drugs, medicaments and biological substances status: Secondary | ICD-10-CM | POA: Diagnosis not present

## 2019-07-26 DIAGNOSIS — Z96653 Presence of artificial knee joint, bilateral: Secondary | ICD-10-CM | POA: Diagnosis not present

## 2019-07-26 DIAGNOSIS — S0990XA Unspecified injury of head, initial encounter: Secondary | ICD-10-CM | POA: Diagnosis not present

## 2019-07-26 DIAGNOSIS — R7981 Abnormal blood-gas level: Secondary | ICD-10-CM | POA: Diagnosis not present

## 2019-07-26 DIAGNOSIS — S0083XA Contusion of other part of head, initial encounter: Secondary | ICD-10-CM | POA: Diagnosis not present

## 2019-07-26 DIAGNOSIS — N390 Urinary tract infection, site not specified: Secondary | ICD-10-CM | POA: Diagnosis not present

## 2019-07-26 DIAGNOSIS — I5032 Chronic diastolic (congestive) heart failure: Secondary | ICD-10-CM | POA: Diagnosis not present

## 2019-07-26 DIAGNOSIS — R9431 Abnormal electrocardiogram [ECG] [EKG]: Secondary | ICD-10-CM | POA: Diagnosis not present

## 2019-07-26 DIAGNOSIS — Z743 Need for continuous supervision: Secondary | ICD-10-CM | POA: Diagnosis not present

## 2019-07-26 DIAGNOSIS — Z043 Encounter for examination and observation following other accident: Secondary | ICD-10-CM | POA: Diagnosis not present

## 2019-07-26 DIAGNOSIS — R2681 Unsteadiness on feet: Secondary | ICD-10-CM | POA: Diagnosis not present

## 2019-07-26 DIAGNOSIS — R69 Illness, unspecified: Secondary | ICD-10-CM | POA: Diagnosis not present

## 2019-07-26 DIAGNOSIS — K5900 Constipation, unspecified: Secondary | ICD-10-CM | POA: Diagnosis not present

## 2019-07-26 DIAGNOSIS — L89620 Pressure ulcer of left heel, unstageable: Secondary | ICD-10-CM | POA: Diagnosis not present

## 2019-07-26 DIAGNOSIS — I4819 Other persistent atrial fibrillation: Secondary | ICD-10-CM | POA: Diagnosis not present

## 2019-07-26 DIAGNOSIS — Z7982 Long term (current) use of aspirin: Secondary | ICD-10-CM | POA: Diagnosis not present

## 2019-07-26 DIAGNOSIS — Z9181 History of falling: Secondary | ICD-10-CM | POA: Diagnosis not present

## 2019-07-26 DIAGNOSIS — R1310 Dysphagia, unspecified: Secondary | ICD-10-CM | POA: Diagnosis not present

## 2019-07-26 DIAGNOSIS — K219 Gastro-esophageal reflux disease without esophagitis: Secondary | ICD-10-CM | POA: Diagnosis not present

## 2019-07-26 DIAGNOSIS — I110 Hypertensive heart disease with heart failure: Secondary | ICD-10-CM | POA: Diagnosis not present

## 2019-07-26 DIAGNOSIS — R404 Transient alteration of awareness: Secondary | ICD-10-CM | POA: Diagnosis not present

## 2019-07-26 DIAGNOSIS — S0003XA Contusion of scalp, initial encounter: Secondary | ICD-10-CM | POA: Diagnosis not present

## 2019-07-26 DIAGNOSIS — J449 Chronic obstructive pulmonary disease, unspecified: Secondary | ICD-10-CM | POA: Diagnosis not present

## 2019-07-26 DIAGNOSIS — M542 Cervicalgia: Secondary | ICD-10-CM | POA: Diagnosis not present

## 2019-07-26 DIAGNOSIS — R296 Repeated falls: Secondary | ICD-10-CM | POA: Diagnosis not present

## 2019-07-26 DIAGNOSIS — H409 Unspecified glaucoma: Secondary | ICD-10-CM | POA: Diagnosis not present

## 2019-07-26 DIAGNOSIS — D649 Anemia, unspecified: Secondary | ICD-10-CM | POA: Diagnosis not present

## 2019-07-26 DIAGNOSIS — I10 Essential (primary) hypertension: Secondary | ICD-10-CM | POA: Diagnosis not present

## 2019-07-26 DIAGNOSIS — R279 Unspecified lack of coordination: Secondary | ICD-10-CM | POA: Diagnosis not present

## 2019-07-26 DIAGNOSIS — R0602 Shortness of breath: Secondary | ICD-10-CM | POA: Diagnosis not present

## 2019-07-26 DIAGNOSIS — R110 Nausea: Secondary | ICD-10-CM | POA: Diagnosis not present

## 2019-07-26 DIAGNOSIS — R410 Disorientation, unspecified: Secondary | ICD-10-CM | POA: Diagnosis not present

## 2019-07-26 DIAGNOSIS — Z7901 Long term (current) use of anticoagulants: Secondary | ICD-10-CM | POA: Diagnosis not present

## 2019-07-26 DIAGNOSIS — E785 Hyperlipidemia, unspecified: Secondary | ICD-10-CM | POA: Diagnosis not present

## 2019-07-26 DIAGNOSIS — R531 Weakness: Secondary | ICD-10-CM | POA: Diagnosis not present

## 2019-07-26 DIAGNOSIS — J690 Pneumonitis due to inhalation of food and vomit: Secondary | ICD-10-CM | POA: Diagnosis not present

## 2019-07-26 DIAGNOSIS — Z885 Allergy status to narcotic agent status: Secondary | ICD-10-CM | POA: Diagnosis not present

## 2019-07-26 DIAGNOSIS — R600 Localized edema: Secondary | ICD-10-CM | POA: Diagnosis not present

## 2019-07-26 DIAGNOSIS — I5030 Unspecified diastolic (congestive) heart failure: Secondary | ICD-10-CM | POA: Diagnosis not present

## 2019-07-26 DIAGNOSIS — R000 Tachycardia, unspecified: Secondary | ICD-10-CM | POA: Diagnosis not present

## 2019-07-26 DIAGNOSIS — R5381 Other malaise: Secondary | ICD-10-CM | POA: Diagnosis not present

## 2019-07-26 DIAGNOSIS — Z9011 Acquired absence of right breast and nipple: Secondary | ICD-10-CM | POA: Diagnosis not present

## 2019-07-26 DIAGNOSIS — R1312 Dysphagia, oropharyngeal phase: Secondary | ICD-10-CM | POA: Diagnosis not present

## 2019-07-26 DIAGNOSIS — W19XXXA Unspecified fall, initial encounter: Secondary | ICD-10-CM | POA: Diagnosis not present

## 2019-07-26 DIAGNOSIS — Z23 Encounter for immunization: Secondary | ICD-10-CM | POA: Diagnosis not present

## 2019-07-26 DIAGNOSIS — Z79899 Other long term (current) drug therapy: Secondary | ICD-10-CM | POA: Diagnosis not present

## 2019-07-26 DIAGNOSIS — I4891 Unspecified atrial fibrillation: Secondary | ICD-10-CM | POA: Diagnosis not present

## 2019-07-26 DIAGNOSIS — E559 Vitamin D deficiency, unspecified: Secondary | ICD-10-CM | POA: Diagnosis not present

## 2019-07-26 DIAGNOSIS — Z853 Personal history of malignant neoplasm of breast: Secondary | ICD-10-CM | POA: Diagnosis not present

## 2019-07-26 DIAGNOSIS — R0902 Hypoxemia: Secondary | ICD-10-CM | POA: Diagnosis not present

## 2019-07-26 DIAGNOSIS — J189 Pneumonia, unspecified organism: Secondary | ICD-10-CM | POA: Diagnosis not present

## 2019-07-29 DIAGNOSIS — I5030 Unspecified diastolic (congestive) heart failure: Secondary | ICD-10-CM | POA: Diagnosis not present

## 2019-07-29 DIAGNOSIS — R5381 Other malaise: Secondary | ICD-10-CM | POA: Diagnosis not present

## 2019-07-29 DIAGNOSIS — I10 Essential (primary) hypertension: Secondary | ICD-10-CM | POA: Diagnosis not present

## 2019-07-29 DIAGNOSIS — R7981 Abnormal blood-gas level: Secondary | ICD-10-CM | POA: Diagnosis not present

## 2019-07-30 DIAGNOSIS — R7981 Abnormal blood-gas level: Secondary | ICD-10-CM | POA: Diagnosis not present

## 2019-07-30 DIAGNOSIS — R5381 Other malaise: Secondary | ICD-10-CM | POA: Diagnosis not present

## 2019-07-30 DIAGNOSIS — J449 Chronic obstructive pulmonary disease, unspecified: Secondary | ICD-10-CM | POA: Diagnosis not present

## 2019-07-30 DIAGNOSIS — J189 Pneumonia, unspecified organism: Secondary | ICD-10-CM | POA: Diagnosis not present

## 2019-07-31 DIAGNOSIS — I4891 Unspecified atrial fibrillation: Secondary | ICD-10-CM | POA: Diagnosis not present

## 2019-07-31 DIAGNOSIS — I10 Essential (primary) hypertension: Secondary | ICD-10-CM | POA: Diagnosis not present

## 2019-07-31 DIAGNOSIS — K219 Gastro-esophageal reflux disease without esophagitis: Secondary | ICD-10-CM | POA: Diagnosis not present

## 2019-07-31 DIAGNOSIS — I5030 Unspecified diastolic (congestive) heart failure: Secondary | ICD-10-CM | POA: Diagnosis not present

## 2019-08-01 ENCOUNTER — Ambulatory Visit: Payer: Medicare Other | Admitting: Physician Assistant

## 2019-08-03 DIAGNOSIS — J189 Pneumonia, unspecified organism: Secondary | ICD-10-CM | POA: Diagnosis not present

## 2019-08-03 DIAGNOSIS — J449 Chronic obstructive pulmonary disease, unspecified: Secondary | ICD-10-CM | POA: Diagnosis not present

## 2019-08-03 DIAGNOSIS — I5030 Unspecified diastolic (congestive) heart failure: Secondary | ICD-10-CM | POA: Diagnosis not present

## 2019-08-06 DIAGNOSIS — L89620 Pressure ulcer of left heel, unstageable: Secondary | ICD-10-CM | POA: Diagnosis not present

## 2019-08-08 ENCOUNTER — Telehealth: Payer: Self-pay | Admitting: Internal Medicine

## 2019-08-08 ENCOUNTER — Other Ambulatory Visit: Payer: Self-pay | Admitting: Internal Medicine

## 2019-08-08 NOTE — Progress Notes (Signed)
Scheduled a call back with patients son, Coralyn Mark. Coralyn Mark stated his mom, [name] is currently in rehab due to a fall she had around Easter. I will call back later.    Raynicia Dukes [employer] Scheduler

## 2019-08-08 NOTE — Telephone Encounter (Signed)
Spoke with patients son, Beedie Kranz regarding scheduling [facility] visit. He states patient is at [facility] after a fall.

## 2019-08-08 NOTE — Progress Notes (Deleted)
HPI: FU atrial fibrillation. Carotid Dopplers July 2018 showed 1 to 39% bilateral stenosis. Echocardiogram November 2020 performed at outside facility showed normal LV function, grade 2 diastolic dysfunction, severe left atrial enlargement, moderate mitral regurgitation, mild mitral stenosis, moderate tricuspid regurgitation.  CTA November 2020 showed no pulmonary embolus, bilateral pleural effusions with patchy groundglass infiltrates/edema.  Patient recently seen at [facility] due to not eating.  Noted to be in atrial fibrillation with rapid ventricular response.  Laboratory showed hemoglobin 13.1, BUN 13, creatinine 0.62.  Patient was given metoprolol and diltiazem with improvement in her symptoms. Monitor March 2021 showed atrial fibrillation rate elevated.  Metoprolol increased.  Patient seen at [facility] April 2021 with multiple falls.  Work-up showed aspiration pneumonia.  Heart rate also elevated.  She was treated with antibiotics and metoprolol increased.  Apixaban was continued.  Since last seen,   Current Outpatient Medications  Medication Sig Dispense Refill  . apixaban (ELIQUIS) 2.5 MG TABS tablet Take 1 tablet (2.5 mg total) by mouth 2 (two) times daily. 60 tablet 12  . azelastine (ASTELIN) 137 MCG/SPRAY nasal spray Place 1 spray into the nose 2 (two) times daily as needed (for allergies). Reported on 07/20/2015    . Biotin (BIOTIN ULTRA STRENGTH) 10 MG CAPS Take 1 capsule by mouth daily.    . furosemide (LASIX) 20 MG tablet Take 2 tablets (40 mg total) by mouth daily. Take in the morning 180 tablet 3  . LORazepam (ATIVAN) 2 MG tablet TAKE 1/2 TABLET BY MOUTH EVERY 8 HOURS AS NEEDED FOR ANXIETY 60 tablet 0  . metoprolol tartrate (LOPRESSOR) 50 MG tablet Take 1 and 1/2 tablets twice a day (Patient taking differently: 75 mg. Take 1 tablet BID) 270 tablet 3  . mirtazapine (REMERON SOL-TAB) 15 MG disintegrating tablet Take 1 tablet (15 mg total) by mouth at bedtime. 30 tablet 5  . pantoprazole  (PROTONIX) 40 MG tablet TAKE 1 TABLET BY MOUTH EVERY DAY 90 tablet 1  . potassium chloride (KLOR-CON) 20 MEQ packet Take 40 mEq by mouth daily. 60 packet 5  . vitamin B-12 (CYANOCOBALAMIN) 1000 MCG tablet Take 1,000 mcg by mouth daily.       No current facility-administered medications for this visit.     Past Medical History:  Diagnosis Date  . Anxiety   . Atrial fibrillation ([place])   . Breast cancer ([place]) 13 years ago   s/p tamoxifen   . COPD (chronic obstructive pulmonary disease) ([place])   . Depression   . Diverticulosis   . Esophageal stricture   . Gastric outlet obstruction   . GERD (gastroesophageal reflux disease)   . Glaucoma   . Hiatal hernia   . Hyperlipidemia   . Hypertension   . Insomnia   . Internal hemorrhoids   . Mitral regurgitation and mitral stenosis   . Osteoarthritis   . Osteoporosis   . Peptic ulcer disease   . Rheumatic heart disease   . Ulcerative colitis     Past Surgical History:  Procedure Laterality Date  . ABDOMINAL HYSTERECTOMY    . BREAST RECONSTRUCTION  2000   right  . BREAST SURGERY     right  . CATARACT EXTRACTION  08/2006 and 10/2006  . HEMORRHOID SURGERY    . HIATAL HERNIA REPAIR N/A 09/03/2012   Procedure: LAPAROSCOPIC REPAIR OF HIATAL HERNIA;  Surgeon: Pedro Earls, MD;  Location: [facility];  Service: General;  Laterality: N/A;  . JOINT REPLACEMENT     right  .  LAPAROSCOPIC NISSEN FUNDOPLICATION N/A [id number]   Procedure: LAP REPAIR OF LARGE TYPE III MIXED HIATUS HERNIA WITH NISSEN FUNOPLICATION;  Surgeon: Pedro Earls, MD;  Location: [facility];  Service: General;  Laterality: N/A;  . [place]   right  . TONSILLECTOMY    . TOTAL KNEE ARTHROPLASTY     left    Social History   Socioeconomic History  . Marital status: Widowed    Spouse name: Not on file  . Number of children: 3  . Years of education: Not on file  . Highest education level: Not on file  Occupational History  . Occupation: retired-sales     Employer: RETIRED  Tobacco Use  . Smoking status: Never Smoker  . Smokeless tobacco: Never Used  Substance and Sexual Activity  . Alcohol use: No    Alcohol/week: 0.0 standard drinks  . Drug use: No  . Sexual activity: Never  Other Topics Concern  . Not on file  Social History Narrative  . Not on file   Social Determinants of Health   Financial Resource Strain:   . Difficulty of Paying Living Expenses:   Food Insecurity:   . Worried About Charity fundraiser in the Last Year:   . Arboriculturist in the Last Year:   Transportation Needs:   . Film/video editor (Medical):   [name] Lack of Transportation (Non-Medical):   Physical Activity:   . Days of Exercise per Week:   . Minutes of Exercise per Session:   Stress:   . Feeling of Stress :   Social Connections:   . Frequency of Communication with Friends and Family:   . Frequency of Social Gatherings with Friends and Family:   . Attends Religious Services:   . Active Member of Clubs or Organizations:   . Attends Archivist Meetings:   [name] Marital Status:   Intimate Partner Violence:   . Fear of Current or Ex-Partner:   . Emotionally Abused:   [name] Physically Abused:   . Sexually Abused:     Family History  Problem Relation Age of Onset  . Coronary artery disease Sister        x 2 in 72's  . Coronary artery disease Brother        x 2 in 70's  . Diabetes Other   . Colon cancer Father 53  . Hypertension Other   . Stroke Mother 63  . Crohn's disease Brother   . Breast cancer Other        Aunt  . Cirrhosis Brother   . Liver disease Son     ROS: no fevers or chills, productive cough, hemoptysis, dysphasia, odynophagia, melena, hematochezia, dysuria, hematuria, rash, seizure activity, orthopnea, PND, pedal edema, claudication. Remaining systems are negative.  Physical Exam: Well-developed well-nourished in no acute distress.  Skin is warm and dry.  HEENT is normal.  Neck is supple.  Chest is clear to  auscultation with normal expansion.  Cardiovascular exam is regular rate and rhythm.  Abdominal exam nontender or distended. No masses palpated. Extremities show no edema. neuro grossly intact  ECG- personally reviewed  A/P  1 persistent atrial fibrillation-patient's heart rate is controlled today.  I will continue metoprolol at present dose.  Issue of anticoagulation is difficult.  She is at risk for anticoagulation due to recurrent falls.  However she also has multiple embolic risk factors as well as mitral stenosis.  She therefore will be very high risk for embolic  event.  We will continue apixaban 2.5 mg twice daily for now.  I discussed the risks and benefits extensively with patient today.  2 hypertension-blood pressure controlled.  Continue present medications.  3 mitral stenosis/mitral regurgitation-we will plan follow-up echoes in the future.  As outlined previously given age and multiple comorbidities I do not think she would be a good candidate for valve surgery.  4 chronic diastolic congestive heart failure-she appears to be euvolemic today.  Continue Lasix at present dose.  5 hyperlipidemia-Per primary care.  Kirk Ruths, MD

## 2019-08-13 DIAGNOSIS — Z7901 Long term (current) use of anticoagulants: Secondary | ICD-10-CM | POA: Diagnosis not present

## 2019-08-13 DIAGNOSIS — Z7982 Long term (current) use of aspirin: Secondary | ICD-10-CM | POA: Diagnosis not present

## 2019-08-13 DIAGNOSIS — S0003XA Contusion of scalp, initial encounter: Secondary | ICD-10-CM | POA: Diagnosis not present

## 2019-08-13 DIAGNOSIS — R9431 Abnormal electrocardiogram [ECG] [EKG]: Secondary | ICD-10-CM | POA: Diagnosis not present

## 2019-08-13 DIAGNOSIS — I4891 Unspecified atrial fibrillation: Secondary | ICD-10-CM | POA: Diagnosis not present

## 2019-08-13 DIAGNOSIS — I10 Essential (primary) hypertension: Secondary | ICD-10-CM | POA: Diagnosis not present

## 2019-08-13 DIAGNOSIS — M542 Cervicalgia: Secondary | ICD-10-CM | POA: Diagnosis not present

## 2019-08-13 DIAGNOSIS — J449 Chronic obstructive pulmonary disease, unspecified: Secondary | ICD-10-CM | POA: Diagnosis not present

## 2019-08-13 DIAGNOSIS — I5030 Unspecified diastolic (congestive) heart failure: Secondary | ICD-10-CM | POA: Diagnosis not present

## 2019-08-13 DIAGNOSIS — K219 Gastro-esophageal reflux disease without esophagitis: Secondary | ICD-10-CM | POA: Diagnosis not present

## 2019-08-13 DIAGNOSIS — Z043 Encounter for examination and observation following other accident: Secondary | ICD-10-CM | POA: Diagnosis not present

## 2019-08-13 DIAGNOSIS — S0990XA Unspecified injury of head, initial encounter: Secondary | ICD-10-CM | POA: Diagnosis not present

## 2019-08-13 DIAGNOSIS — Z79899 Other long term (current) drug therapy: Secondary | ICD-10-CM | POA: Diagnosis not present

## 2019-08-13 DIAGNOSIS — Z23 Encounter for immunization: Secondary | ICD-10-CM | POA: Diagnosis not present

## 2019-08-13 DIAGNOSIS — N390 Urinary tract infection, site not specified: Secondary | ICD-10-CM | POA: Diagnosis not present

## 2019-08-14 ENCOUNTER — Ambulatory Visit: Payer: Medicare Other | Admitting: Cardiology

## 2019-08-20 ENCOUNTER — Ambulatory Visit: Payer: Medicare Other | Admitting: Internal Medicine

## 2019-08-20 ENCOUNTER — Other Ambulatory Visit: Payer: Self-pay | Admitting: Internal Medicine

## 2019-08-20 DIAGNOSIS — I480 Paroxysmal atrial fibrillation: Secondary | ICD-10-CM | POA: Diagnosis not present

## 2019-08-20 DIAGNOSIS — K219 Gastro-esophageal reflux disease without esophagitis: Secondary | ICD-10-CM | POA: Diagnosis not present

## 2019-08-20 DIAGNOSIS — I5032 Chronic diastolic (congestive) heart failure: Secondary | ICD-10-CM | POA: Diagnosis not present

## 2019-08-20 DIAGNOSIS — J984 Other disorders of lung: Secondary | ICD-10-CM | POA: Diagnosis not present

## 2019-08-20 DIAGNOSIS — R609 Edema, unspecified: Secondary | ICD-10-CM | POA: Diagnosis not present

## 2019-08-22 ENCOUNTER — Ambulatory Visit: Payer: Medicare Other | Admitting: Internal Medicine

## 2019-08-24 DIAGNOSIS — K219 Gastro-esophageal reflux disease without esophagitis: Secondary | ICD-10-CM | POA: Diagnosis not present

## 2019-08-24 DIAGNOSIS — Z853 Personal history of malignant neoplasm of breast: Secondary | ICD-10-CM | POA: Diagnosis not present

## 2019-08-24 DIAGNOSIS — E559 Vitamin D deficiency, unspecified: Secondary | ICD-10-CM | POA: Diagnosis not present

## 2019-08-24 DIAGNOSIS — I872 Venous insufficiency (chronic) (peripheral): Secondary | ICD-10-CM | POA: Diagnosis not present

## 2019-08-24 DIAGNOSIS — Z9181 History of falling: Secondary | ICD-10-CM | POA: Diagnosis not present

## 2019-08-24 DIAGNOSIS — I480 Paroxysmal atrial fibrillation: Secondary | ICD-10-CM | POA: Diagnosis not present

## 2019-08-24 DIAGNOSIS — Z7982 Long term (current) use of aspirin: Secondary | ICD-10-CM | POA: Diagnosis not present

## 2019-08-24 DIAGNOSIS — Z9011 Acquired absence of right breast and nipple: Secondary | ICD-10-CM | POA: Diagnosis not present

## 2019-08-24 DIAGNOSIS — R238 Other skin changes: Secondary | ICD-10-CM | POA: Diagnosis not present

## 2019-08-24 DIAGNOSIS — Z993 Dependence on wheelchair: Secondary | ICD-10-CM | POA: Diagnosis not present

## 2019-08-24 DIAGNOSIS — I110 Hypertensive heart disease with heart failure: Secondary | ICD-10-CM | POA: Diagnosis not present

## 2019-08-24 DIAGNOSIS — Z9981 Dependence on supplemental oxygen: Secondary | ICD-10-CM | POA: Diagnosis not present

## 2019-08-24 DIAGNOSIS — I5032 Chronic diastolic (congestive) heart failure: Secondary | ICD-10-CM | POA: Diagnosis not present

## 2019-08-24 DIAGNOSIS — Z96653 Presence of artificial knee joint, bilateral: Secondary | ICD-10-CM | POA: Diagnosis not present

## 2019-08-24 DIAGNOSIS — Z87440 Personal history of urinary (tract) infections: Secondary | ICD-10-CM | POA: Diagnosis not present

## 2019-08-24 DIAGNOSIS — H409 Unspecified glaucoma: Secondary | ICD-10-CM | POA: Diagnosis not present

## 2019-08-24 DIAGNOSIS — J449 Chronic obstructive pulmonary disease, unspecified: Secondary | ICD-10-CM | POA: Diagnosis not present

## 2019-08-24 DIAGNOSIS — Z7901 Long term (current) use of anticoagulants: Secondary | ICD-10-CM | POA: Diagnosis not present

## 2019-08-25 DIAGNOSIS — I480 Paroxysmal atrial fibrillation: Secondary | ICD-10-CM | POA: Diagnosis not present

## 2019-08-25 DIAGNOSIS — E559 Vitamin D deficiency, unspecified: Secondary | ICD-10-CM | POA: Diagnosis not present

## 2019-08-25 DIAGNOSIS — I872 Venous insufficiency (chronic) (peripheral): Secondary | ICD-10-CM | POA: Diagnosis not present

## 2019-08-25 DIAGNOSIS — Z9181 History of falling: Secondary | ICD-10-CM | POA: Diagnosis not present

## 2019-08-25 DIAGNOSIS — Z7982 Long term (current) use of aspirin: Secondary | ICD-10-CM | POA: Diagnosis not present

## 2019-08-25 DIAGNOSIS — Z853 Personal history of malignant neoplasm of breast: Secondary | ICD-10-CM | POA: Diagnosis not present

## 2019-08-25 DIAGNOSIS — I5032 Chronic diastolic (congestive) heart failure: Secondary | ICD-10-CM | POA: Diagnosis not present

## 2019-08-25 DIAGNOSIS — Z993 Dependence on wheelchair: Secondary | ICD-10-CM | POA: Diagnosis not present

## 2019-08-25 DIAGNOSIS — Z9011 Acquired absence of right breast and nipple: Secondary | ICD-10-CM | POA: Diagnosis not present

## 2019-08-25 DIAGNOSIS — J449 Chronic obstructive pulmonary disease, unspecified: Secondary | ICD-10-CM | POA: Diagnosis not present

## 2019-08-25 DIAGNOSIS — Z96653 Presence of artificial knee joint, bilateral: Secondary | ICD-10-CM | POA: Diagnosis not present

## 2019-08-25 DIAGNOSIS — Z7901 Long term (current) use of anticoagulants: Secondary | ICD-10-CM | POA: Diagnosis not present

## 2019-08-25 DIAGNOSIS — I110 Hypertensive heart disease with heart failure: Secondary | ICD-10-CM | POA: Diagnosis not present

## 2019-08-25 DIAGNOSIS — Z9981 Dependence on supplemental oxygen: Secondary | ICD-10-CM | POA: Diagnosis not present

## 2019-08-25 DIAGNOSIS — H409 Unspecified glaucoma: Secondary | ICD-10-CM | POA: Diagnosis not present

## 2019-08-25 DIAGNOSIS — K219 Gastro-esophageal reflux disease without esophagitis: Secondary | ICD-10-CM | POA: Diagnosis not present

## 2019-08-25 DIAGNOSIS — Z87440 Personal history of urinary (tract) infections: Secondary | ICD-10-CM | POA: Diagnosis not present

## 2019-08-25 DIAGNOSIS — R238 Other skin changes: Secondary | ICD-10-CM | POA: Diagnosis not present

## 2019-08-26 DIAGNOSIS — Z87440 Personal history of urinary (tract) infections: Secondary | ICD-10-CM | POA: Diagnosis not present

## 2019-08-26 DIAGNOSIS — Z993 Dependence on wheelchair: Secondary | ICD-10-CM | POA: Diagnosis not present

## 2019-08-26 DIAGNOSIS — J449 Chronic obstructive pulmonary disease, unspecified: Secondary | ICD-10-CM | POA: Diagnosis not present

## 2019-08-26 DIAGNOSIS — H409 Unspecified glaucoma: Secondary | ICD-10-CM | POA: Diagnosis not present

## 2019-08-26 DIAGNOSIS — I5032 Chronic diastolic (congestive) heart failure: Secondary | ICD-10-CM | POA: Diagnosis not present

## 2019-08-26 DIAGNOSIS — Z96653 Presence of artificial knee joint, bilateral: Secondary | ICD-10-CM | POA: Diagnosis not present

## 2019-08-26 DIAGNOSIS — Z853 Personal history of malignant neoplasm of breast: Secondary | ICD-10-CM | POA: Diagnosis not present

## 2019-08-26 DIAGNOSIS — K219 Gastro-esophageal reflux disease without esophagitis: Secondary | ICD-10-CM | POA: Diagnosis not present

## 2019-08-26 DIAGNOSIS — Z9981 Dependence on supplemental oxygen: Secondary | ICD-10-CM | POA: Diagnosis not present

## 2019-08-26 DIAGNOSIS — I480 Paroxysmal atrial fibrillation: Secondary | ICD-10-CM | POA: Diagnosis not present

## 2019-08-26 DIAGNOSIS — Z9011 Acquired absence of right breast and nipple: Secondary | ICD-10-CM | POA: Diagnosis not present

## 2019-08-26 DIAGNOSIS — Z7982 Long term (current) use of aspirin: Secondary | ICD-10-CM | POA: Diagnosis not present

## 2019-08-26 DIAGNOSIS — I110 Hypertensive heart disease with heart failure: Secondary | ICD-10-CM | POA: Diagnosis not present

## 2019-08-26 DIAGNOSIS — R238 Other skin changes: Secondary | ICD-10-CM | POA: Diagnosis not present

## 2019-08-26 DIAGNOSIS — Z7901 Long term (current) use of anticoagulants: Secondary | ICD-10-CM | POA: Diagnosis not present

## 2019-08-26 DIAGNOSIS — Z9181 History of falling: Secondary | ICD-10-CM | POA: Diagnosis not present

## 2019-08-26 DIAGNOSIS — E559 Vitamin D deficiency, unspecified: Secondary | ICD-10-CM | POA: Diagnosis not present

## 2019-08-26 DIAGNOSIS — I872 Venous insufficiency (chronic) (peripheral): Secondary | ICD-10-CM | POA: Diagnosis not present

## 2019-08-28 DIAGNOSIS — Z7901 Long term (current) use of anticoagulants: Secondary | ICD-10-CM | POA: Diagnosis not present

## 2019-08-28 DIAGNOSIS — I872 Venous insufficiency (chronic) (peripheral): Secondary | ICD-10-CM | POA: Diagnosis not present

## 2019-08-28 DIAGNOSIS — Z7982 Long term (current) use of aspirin: Secondary | ICD-10-CM | POA: Diagnosis not present

## 2019-08-28 DIAGNOSIS — R238 Other skin changes: Secondary | ICD-10-CM | POA: Diagnosis not present

## 2019-08-28 DIAGNOSIS — Z853 Personal history of malignant neoplasm of breast: Secondary | ICD-10-CM | POA: Diagnosis not present

## 2019-08-28 DIAGNOSIS — Z9011 Acquired absence of right breast and nipple: Secondary | ICD-10-CM | POA: Diagnosis not present

## 2019-08-28 DIAGNOSIS — Z87440 Personal history of urinary (tract) infections: Secondary | ICD-10-CM | POA: Diagnosis not present

## 2019-08-28 DIAGNOSIS — I480 Paroxysmal atrial fibrillation: Secondary | ICD-10-CM | POA: Diagnosis not present

## 2019-08-28 DIAGNOSIS — Z96653 Presence of artificial knee joint, bilateral: Secondary | ICD-10-CM | POA: Diagnosis not present

## 2019-08-28 DIAGNOSIS — H409 Unspecified glaucoma: Secondary | ICD-10-CM | POA: Diagnosis not present

## 2019-08-28 DIAGNOSIS — E559 Vitamin D deficiency, unspecified: Secondary | ICD-10-CM | POA: Diagnosis not present

## 2019-08-28 DIAGNOSIS — I5032 Chronic diastolic (congestive) heart failure: Secondary | ICD-10-CM | POA: Diagnosis not present

## 2019-08-28 DIAGNOSIS — Z9981 Dependence on supplemental oxygen: Secondary | ICD-10-CM | POA: Diagnosis not present

## 2019-08-28 DIAGNOSIS — Z993 Dependence on wheelchair: Secondary | ICD-10-CM | POA: Diagnosis not present

## 2019-08-28 DIAGNOSIS — J449 Chronic obstructive pulmonary disease, unspecified: Secondary | ICD-10-CM | POA: Diagnosis not present

## 2019-08-28 DIAGNOSIS — Z9181 History of falling: Secondary | ICD-10-CM | POA: Diagnosis not present

## 2019-08-28 DIAGNOSIS — K219 Gastro-esophageal reflux disease without esophagitis: Secondary | ICD-10-CM | POA: Diagnosis not present

## 2019-08-28 DIAGNOSIS — I110 Hypertensive heart disease with heart failure: Secondary | ICD-10-CM | POA: Diagnosis not present

## 2019-08-30 DIAGNOSIS — I5032 Chronic diastolic (congestive) heart failure: Secondary | ICD-10-CM | POA: Diagnosis not present

## 2019-08-30 DIAGNOSIS — I480 Paroxysmal atrial fibrillation: Secondary | ICD-10-CM | POA: Diagnosis not present

## 2019-08-30 DIAGNOSIS — R600 Localized edema: Secondary | ICD-10-CM | POA: Diagnosis not present

## 2019-08-30 DIAGNOSIS — R262 Difficulty in walking, not elsewhere classified: Secondary | ICD-10-CM | POA: Diagnosis not present

## 2019-08-31 ENCOUNTER — Other Ambulatory Visit: Payer: Self-pay | Admitting: Internal Medicine

## 2019-08-31 DIAGNOSIS — Z853 Personal history of malignant neoplasm of breast: Secondary | ICD-10-CM | POA: Diagnosis not present

## 2019-08-31 DIAGNOSIS — J449 Chronic obstructive pulmonary disease, unspecified: Secondary | ICD-10-CM | POA: Diagnosis not present

## 2019-08-31 DIAGNOSIS — I480 Paroxysmal atrial fibrillation: Secondary | ICD-10-CM | POA: Diagnosis not present

## 2019-08-31 DIAGNOSIS — Z9011 Acquired absence of right breast and nipple: Secondary | ICD-10-CM | POA: Diagnosis not present

## 2019-08-31 DIAGNOSIS — K219 Gastro-esophageal reflux disease without esophagitis: Secondary | ICD-10-CM | POA: Diagnosis not present

## 2019-08-31 DIAGNOSIS — Z7901 Long term (current) use of anticoagulants: Secondary | ICD-10-CM | POA: Diagnosis not present

## 2019-08-31 DIAGNOSIS — I5032 Chronic diastolic (congestive) heart failure: Secondary | ICD-10-CM | POA: Diagnosis not present

## 2019-08-31 DIAGNOSIS — Z7982 Long term (current) use of aspirin: Secondary | ICD-10-CM | POA: Diagnosis not present

## 2019-08-31 DIAGNOSIS — H409 Unspecified glaucoma: Secondary | ICD-10-CM | POA: Diagnosis not present

## 2019-08-31 DIAGNOSIS — R238 Other skin changes: Secondary | ICD-10-CM | POA: Diagnosis not present

## 2019-08-31 DIAGNOSIS — Z9981 Dependence on supplemental oxygen: Secondary | ICD-10-CM | POA: Diagnosis not present

## 2019-08-31 DIAGNOSIS — E559 Vitamin D deficiency, unspecified: Secondary | ICD-10-CM | POA: Diagnosis not present

## 2019-08-31 DIAGNOSIS — I110 Hypertensive heart disease with heart failure: Secondary | ICD-10-CM | POA: Diagnosis not present

## 2019-08-31 DIAGNOSIS — Z993 Dependence on wheelchair: Secondary | ICD-10-CM | POA: Diagnosis not present

## 2019-08-31 DIAGNOSIS — Z96653 Presence of artificial knee joint, bilateral: Secondary | ICD-10-CM | POA: Diagnosis not present

## 2019-08-31 DIAGNOSIS — Z87440 Personal history of urinary (tract) infections: Secondary | ICD-10-CM | POA: Diagnosis not present

## 2019-08-31 DIAGNOSIS — I872 Venous insufficiency (chronic) (peripheral): Secondary | ICD-10-CM | POA: Diagnosis not present

## 2019-08-31 DIAGNOSIS — Z9181 History of falling: Secondary | ICD-10-CM | POA: Diagnosis not present

## 2019-09-03 DIAGNOSIS — R238 Other skin changes: Secondary | ICD-10-CM | POA: Diagnosis not present

## 2019-09-03 DIAGNOSIS — Z96653 Presence of artificial knee joint, bilateral: Secondary | ICD-10-CM | POA: Diagnosis not present

## 2019-09-03 DIAGNOSIS — Z993 Dependence on wheelchair: Secondary | ICD-10-CM | POA: Diagnosis not present

## 2019-09-03 DIAGNOSIS — J449 Chronic obstructive pulmonary disease, unspecified: Secondary | ICD-10-CM | POA: Diagnosis not present

## 2019-09-03 DIAGNOSIS — Z9011 Acquired absence of right breast and nipple: Secondary | ICD-10-CM | POA: Diagnosis not present

## 2019-09-03 DIAGNOSIS — K219 Gastro-esophageal reflux disease without esophagitis: Secondary | ICD-10-CM | POA: Diagnosis not present

## 2019-09-03 DIAGNOSIS — H409 Unspecified glaucoma: Secondary | ICD-10-CM | POA: Diagnosis not present

## 2019-09-03 DIAGNOSIS — Z7982 Long term (current) use of aspirin: Secondary | ICD-10-CM | POA: Diagnosis not present

## 2019-09-03 DIAGNOSIS — I5032 Chronic diastolic (congestive) heart failure: Secondary | ICD-10-CM | POA: Diagnosis not present

## 2019-09-03 DIAGNOSIS — S91302A Unspecified open wound, left foot, initial encounter: Secondary | ICD-10-CM | POA: Diagnosis not present

## 2019-09-03 DIAGNOSIS — I110 Hypertensive heart disease with heart failure: Secondary | ICD-10-CM | POA: Diagnosis not present

## 2019-09-03 DIAGNOSIS — Z7901 Long term (current) use of anticoagulants: Secondary | ICD-10-CM | POA: Diagnosis not present

## 2019-09-03 DIAGNOSIS — S91301A Unspecified open wound, right foot, initial encounter: Secondary | ICD-10-CM | POA: Diagnosis not present

## 2019-09-03 DIAGNOSIS — Z9981 Dependence on supplemental oxygen: Secondary | ICD-10-CM | POA: Diagnosis not present

## 2019-09-03 DIAGNOSIS — I480 Paroxysmal atrial fibrillation: Secondary | ICD-10-CM | POA: Diagnosis not present

## 2019-09-03 DIAGNOSIS — E559 Vitamin D deficiency, unspecified: Secondary | ICD-10-CM | POA: Diagnosis not present

## 2019-09-03 DIAGNOSIS — Z9181 History of falling: Secondary | ICD-10-CM | POA: Diagnosis not present

## 2019-09-03 DIAGNOSIS — Z87440 Personal history of urinary (tract) infections: Secondary | ICD-10-CM | POA: Diagnosis not present

## 2019-09-03 DIAGNOSIS — I872 Venous insufficiency (chronic) (peripheral): Secondary | ICD-10-CM | POA: Diagnosis not present

## 2019-09-03 DIAGNOSIS — Z853 Personal history of malignant neoplasm of breast: Secondary | ICD-10-CM | POA: Diagnosis not present

## 2019-09-05 DIAGNOSIS — J449 Chronic obstructive pulmonary disease, unspecified: Secondary | ICD-10-CM | POA: Diagnosis not present

## 2019-09-05 DIAGNOSIS — I110 Hypertensive heart disease with heart failure: Secondary | ICD-10-CM | POA: Diagnosis not present

## 2019-09-05 DIAGNOSIS — Z87440 Personal history of urinary (tract) infections: Secondary | ICD-10-CM | POA: Diagnosis not present

## 2019-09-05 DIAGNOSIS — Z96653 Presence of artificial knee joint, bilateral: Secondary | ICD-10-CM | POA: Diagnosis not present

## 2019-09-05 DIAGNOSIS — I872 Venous insufficiency (chronic) (peripheral): Secondary | ICD-10-CM | POA: Diagnosis not present

## 2019-09-05 DIAGNOSIS — E559 Vitamin D deficiency, unspecified: Secondary | ICD-10-CM | POA: Diagnosis not present

## 2019-09-05 DIAGNOSIS — R238 Other skin changes: Secondary | ICD-10-CM | POA: Diagnosis not present

## 2019-09-05 DIAGNOSIS — Z7982 Long term (current) use of aspirin: Secondary | ICD-10-CM | POA: Diagnosis not present

## 2019-09-05 DIAGNOSIS — K219 Gastro-esophageal reflux disease without esophagitis: Secondary | ICD-10-CM | POA: Diagnosis not present

## 2019-09-05 DIAGNOSIS — Z7901 Long term (current) use of anticoagulants: Secondary | ICD-10-CM | POA: Diagnosis not present

## 2019-09-05 DIAGNOSIS — Z993 Dependence on wheelchair: Secondary | ICD-10-CM | POA: Diagnosis not present

## 2019-09-05 DIAGNOSIS — Z9011 Acquired absence of right breast and nipple: Secondary | ICD-10-CM | POA: Diagnosis not present

## 2019-09-05 DIAGNOSIS — H409 Unspecified glaucoma: Secondary | ICD-10-CM | POA: Diagnosis not present

## 2019-09-05 DIAGNOSIS — Z9981 Dependence on supplemental oxygen: Secondary | ICD-10-CM | POA: Diagnosis not present

## 2019-09-05 DIAGNOSIS — I5032 Chronic diastolic (congestive) heart failure: Secondary | ICD-10-CM | POA: Diagnosis not present

## 2019-09-05 DIAGNOSIS — I480 Paroxysmal atrial fibrillation: Secondary | ICD-10-CM | POA: Diagnosis not present

## 2019-09-05 DIAGNOSIS — Z853 Personal history of malignant neoplasm of breast: Secondary | ICD-10-CM | POA: Diagnosis not present

## 2019-09-05 DIAGNOSIS — Z9181 History of falling: Secondary | ICD-10-CM | POA: Diagnosis not present

## 2019-09-06 DIAGNOSIS — I480 Paroxysmal atrial fibrillation: Secondary | ICD-10-CM | POA: Diagnosis not present

## 2019-09-06 DIAGNOSIS — Z87440 Personal history of urinary (tract) infections: Secondary | ICD-10-CM | POA: Diagnosis not present

## 2019-09-06 DIAGNOSIS — Z9981 Dependence on supplemental oxygen: Secondary | ICD-10-CM | POA: Diagnosis not present

## 2019-09-06 DIAGNOSIS — Z9011 Acquired absence of right breast and nipple: Secondary | ICD-10-CM | POA: Diagnosis not present

## 2019-09-06 DIAGNOSIS — Z853 Personal history of malignant neoplasm of breast: Secondary | ICD-10-CM | POA: Diagnosis not present

## 2019-09-06 DIAGNOSIS — H409 Unspecified glaucoma: Secondary | ICD-10-CM | POA: Diagnosis not present

## 2019-09-06 DIAGNOSIS — R238 Other skin changes: Secondary | ICD-10-CM | POA: Diagnosis not present

## 2019-09-06 DIAGNOSIS — Z7982 Long term (current) use of aspirin: Secondary | ICD-10-CM | POA: Diagnosis not present

## 2019-09-06 DIAGNOSIS — J449 Chronic obstructive pulmonary disease, unspecified: Secondary | ICD-10-CM | POA: Diagnosis not present

## 2019-09-06 DIAGNOSIS — Z993 Dependence on wheelchair: Secondary | ICD-10-CM | POA: Diagnosis not present

## 2019-09-06 DIAGNOSIS — I110 Hypertensive heart disease with heart failure: Secondary | ICD-10-CM | POA: Diagnosis not present

## 2019-09-06 DIAGNOSIS — Z7901 Long term (current) use of anticoagulants: Secondary | ICD-10-CM | POA: Diagnosis not present

## 2019-09-06 DIAGNOSIS — I5032 Chronic diastolic (congestive) heart failure: Secondary | ICD-10-CM | POA: Diagnosis not present

## 2019-09-06 DIAGNOSIS — I872 Venous insufficiency (chronic) (peripheral): Secondary | ICD-10-CM | POA: Diagnosis not present

## 2019-09-06 DIAGNOSIS — K219 Gastro-esophageal reflux disease without esophagitis: Secondary | ICD-10-CM | POA: Diagnosis not present

## 2019-09-06 DIAGNOSIS — E559 Vitamin D deficiency, unspecified: Secondary | ICD-10-CM | POA: Diagnosis not present

## 2019-09-06 DIAGNOSIS — Z9181 History of falling: Secondary | ICD-10-CM | POA: Diagnosis not present

## 2019-09-06 DIAGNOSIS — Z96653 Presence of artificial knee joint, bilateral: Secondary | ICD-10-CM | POA: Diagnosis not present

## 2019-09-09 DIAGNOSIS — Z7982 Long term (current) use of aspirin: Secondary | ICD-10-CM | POA: Diagnosis not present

## 2019-09-09 DIAGNOSIS — K219 Gastro-esophageal reflux disease without esophagitis: Secondary | ICD-10-CM | POA: Diagnosis not present

## 2019-09-09 DIAGNOSIS — I110 Hypertensive heart disease with heart failure: Secondary | ICD-10-CM | POA: Diagnosis not present

## 2019-09-09 DIAGNOSIS — I5032 Chronic diastolic (congestive) heart failure: Secondary | ICD-10-CM | POA: Diagnosis not present

## 2019-09-09 DIAGNOSIS — J449 Chronic obstructive pulmonary disease, unspecified: Secondary | ICD-10-CM | POA: Diagnosis not present

## 2019-09-09 DIAGNOSIS — Z7901 Long term (current) use of anticoagulants: Secondary | ICD-10-CM | POA: Diagnosis not present

## 2019-09-09 DIAGNOSIS — Z9011 Acquired absence of right breast and nipple: Secondary | ICD-10-CM | POA: Diagnosis not present

## 2019-09-09 DIAGNOSIS — R238 Other skin changes: Secondary | ICD-10-CM | POA: Diagnosis not present

## 2019-09-09 DIAGNOSIS — E559 Vitamin D deficiency, unspecified: Secondary | ICD-10-CM | POA: Diagnosis not present

## 2019-09-09 DIAGNOSIS — Z853 Personal history of malignant neoplasm of breast: Secondary | ICD-10-CM | POA: Diagnosis not present

## 2019-09-09 DIAGNOSIS — H409 Unspecified glaucoma: Secondary | ICD-10-CM | POA: Diagnosis not present

## 2019-09-09 DIAGNOSIS — Z993 Dependence on wheelchair: Secondary | ICD-10-CM | POA: Diagnosis not present

## 2019-09-09 DIAGNOSIS — I480 Paroxysmal atrial fibrillation: Secondary | ICD-10-CM | POA: Diagnosis not present

## 2019-09-09 DIAGNOSIS — Z9981 Dependence on supplemental oxygen: Secondary | ICD-10-CM | POA: Diagnosis not present

## 2019-09-09 DIAGNOSIS — I872 Venous insufficiency (chronic) (peripheral): Secondary | ICD-10-CM | POA: Diagnosis not present

## 2019-09-09 DIAGNOSIS — Z87440 Personal history of urinary (tract) infections: Secondary | ICD-10-CM | POA: Diagnosis not present

## 2019-09-09 DIAGNOSIS — Z9181 History of falling: Secondary | ICD-10-CM | POA: Diagnosis not present

## 2019-09-09 DIAGNOSIS — Z96653 Presence of artificial knee joint, bilateral: Secondary | ICD-10-CM | POA: Diagnosis not present

## 2019-09-10 DIAGNOSIS — I480 Paroxysmal atrial fibrillation: Secondary | ICD-10-CM | POA: Diagnosis not present

## 2019-09-11 DIAGNOSIS — Z9011 Acquired absence of right breast and nipple: Secondary | ICD-10-CM | POA: Diagnosis not present

## 2019-09-11 DIAGNOSIS — J449 Chronic obstructive pulmonary disease, unspecified: Secondary | ICD-10-CM | POA: Diagnosis not present

## 2019-09-11 DIAGNOSIS — I110 Hypertensive heart disease with heart failure: Secondary | ICD-10-CM | POA: Diagnosis not present

## 2019-09-11 DIAGNOSIS — Z87440 Personal history of urinary (tract) infections: Secondary | ICD-10-CM | POA: Diagnosis not present

## 2019-09-11 DIAGNOSIS — Z96653 Presence of artificial knee joint, bilateral: Secondary | ICD-10-CM | POA: Diagnosis not present

## 2019-09-11 DIAGNOSIS — Z9181 History of falling: Secondary | ICD-10-CM | POA: Diagnosis not present

## 2019-09-11 DIAGNOSIS — Z853 Personal history of malignant neoplasm of breast: Secondary | ICD-10-CM | POA: Diagnosis not present

## 2019-09-11 DIAGNOSIS — Z7982 Long term (current) use of aspirin: Secondary | ICD-10-CM | POA: Diagnosis not present

## 2019-09-11 DIAGNOSIS — Z9981 Dependence on supplemental oxygen: Secondary | ICD-10-CM | POA: Diagnosis not present

## 2019-09-11 DIAGNOSIS — R238 Other skin changes: Secondary | ICD-10-CM | POA: Diagnosis not present

## 2019-09-11 DIAGNOSIS — Z7901 Long term (current) use of anticoagulants: Secondary | ICD-10-CM | POA: Diagnosis not present

## 2019-09-11 DIAGNOSIS — E559 Vitamin D deficiency, unspecified: Secondary | ICD-10-CM | POA: Diagnosis not present

## 2019-09-11 DIAGNOSIS — K219 Gastro-esophageal reflux disease without esophagitis: Secondary | ICD-10-CM | POA: Diagnosis not present

## 2019-09-11 DIAGNOSIS — I5032 Chronic diastolic (congestive) heart failure: Secondary | ICD-10-CM | POA: Diagnosis not present

## 2019-09-11 DIAGNOSIS — Z993 Dependence on wheelchair: Secondary | ICD-10-CM | POA: Diagnosis not present

## 2019-09-11 DIAGNOSIS — I480 Paroxysmal atrial fibrillation: Secondary | ICD-10-CM | POA: Diagnosis not present

## 2019-09-11 DIAGNOSIS — H409 Unspecified glaucoma: Secondary | ICD-10-CM | POA: Diagnosis not present

## 2019-09-11 DIAGNOSIS — I872 Venous insufficiency (chronic) (peripheral): Secondary | ICD-10-CM | POA: Diagnosis not present

## 2019-09-12 DIAGNOSIS — H409 Unspecified glaucoma: Secondary | ICD-10-CM | POA: Diagnosis not present

## 2019-09-12 DIAGNOSIS — Z9981 Dependence on supplemental oxygen: Secondary | ICD-10-CM | POA: Diagnosis not present

## 2019-09-12 DIAGNOSIS — I5032 Chronic diastolic (congestive) heart failure: Secondary | ICD-10-CM | POA: Diagnosis not present

## 2019-09-12 DIAGNOSIS — I110 Hypertensive heart disease with heart failure: Secondary | ICD-10-CM | POA: Diagnosis not present

## 2019-09-12 DIAGNOSIS — Z9011 Acquired absence of right breast and nipple: Secondary | ICD-10-CM | POA: Diagnosis not present

## 2019-09-12 DIAGNOSIS — I480 Paroxysmal atrial fibrillation: Secondary | ICD-10-CM | POA: Diagnosis not present

## 2019-09-12 DIAGNOSIS — Z9181 History of falling: Secondary | ICD-10-CM | POA: Diagnosis not present

## 2019-09-12 DIAGNOSIS — K219 Gastro-esophageal reflux disease without esophagitis: Secondary | ICD-10-CM | POA: Diagnosis not present

## 2019-09-12 DIAGNOSIS — Z993 Dependence on wheelchair: Secondary | ICD-10-CM | POA: Diagnosis not present

## 2019-09-12 DIAGNOSIS — I872 Venous insufficiency (chronic) (peripheral): Secondary | ICD-10-CM | POA: Diagnosis not present

## 2019-09-12 DIAGNOSIS — Z853 Personal history of malignant neoplasm of breast: Secondary | ICD-10-CM | POA: Diagnosis not present

## 2019-09-12 DIAGNOSIS — Z7901 Long term (current) use of anticoagulants: Secondary | ICD-10-CM | POA: Diagnosis not present

## 2019-09-12 DIAGNOSIS — J449 Chronic obstructive pulmonary disease, unspecified: Secondary | ICD-10-CM | POA: Diagnosis not present

## 2019-09-12 DIAGNOSIS — E559 Vitamin D deficiency, unspecified: Secondary | ICD-10-CM | POA: Diagnosis not present

## 2019-09-12 DIAGNOSIS — R238 Other skin changes: Secondary | ICD-10-CM | POA: Diagnosis not present

## 2019-09-12 DIAGNOSIS — Z96653 Presence of artificial knee joint, bilateral: Secondary | ICD-10-CM | POA: Diagnosis not present

## 2019-09-12 DIAGNOSIS — Z7982 Long term (current) use of aspirin: Secondary | ICD-10-CM | POA: Diagnosis not present

## 2019-09-12 DIAGNOSIS — Z87440 Personal history of urinary (tract) infections: Secondary | ICD-10-CM | POA: Diagnosis not present

## 2019-09-13 DIAGNOSIS — K219 Gastro-esophageal reflux disease without esophagitis: Secondary | ICD-10-CM | POA: Diagnosis not present

## 2019-09-13 DIAGNOSIS — H409 Unspecified glaucoma: Secondary | ICD-10-CM | POA: Diagnosis not present

## 2019-09-13 DIAGNOSIS — Z993 Dependence on wheelchair: Secondary | ICD-10-CM | POA: Diagnosis not present

## 2019-09-13 DIAGNOSIS — I110 Hypertensive heart disease with heart failure: Secondary | ICD-10-CM | POA: Diagnosis not present

## 2019-09-13 DIAGNOSIS — Z87440 Personal history of urinary (tract) infections: Secondary | ICD-10-CM | POA: Diagnosis not present

## 2019-09-13 DIAGNOSIS — I5032 Chronic diastolic (congestive) heart failure: Secondary | ICD-10-CM | POA: Diagnosis not present

## 2019-09-13 DIAGNOSIS — Z7901 Long term (current) use of anticoagulants: Secondary | ICD-10-CM | POA: Diagnosis not present

## 2019-09-13 DIAGNOSIS — Z853 Personal history of malignant neoplasm of breast: Secondary | ICD-10-CM | POA: Diagnosis not present

## 2019-09-13 DIAGNOSIS — Z96653 Presence of artificial knee joint, bilateral: Secondary | ICD-10-CM | POA: Diagnosis not present

## 2019-09-13 DIAGNOSIS — E559 Vitamin D deficiency, unspecified: Secondary | ICD-10-CM | POA: Diagnosis not present

## 2019-09-13 DIAGNOSIS — Z9181 History of falling: Secondary | ICD-10-CM | POA: Diagnosis not present

## 2019-09-13 DIAGNOSIS — Z7982 Long term (current) use of aspirin: Secondary | ICD-10-CM | POA: Diagnosis not present

## 2019-09-13 DIAGNOSIS — R238 Other skin changes: Secondary | ICD-10-CM | POA: Diagnosis not present

## 2019-09-13 DIAGNOSIS — J449 Chronic obstructive pulmonary disease, unspecified: Secondary | ICD-10-CM | POA: Diagnosis not present

## 2019-09-13 DIAGNOSIS — Z9981 Dependence on supplemental oxygen: Secondary | ICD-10-CM | POA: Diagnosis not present

## 2019-09-13 DIAGNOSIS — I480 Paroxysmal atrial fibrillation: Secondary | ICD-10-CM | POA: Diagnosis not present

## 2019-09-13 DIAGNOSIS — Z9011 Acquired absence of right breast and nipple: Secondary | ICD-10-CM | POA: Diagnosis not present

## 2019-09-13 DIAGNOSIS — I872 Venous insufficiency (chronic) (peripheral): Secondary | ICD-10-CM | POA: Diagnosis not present

## 2019-09-17 ENCOUNTER — Ambulatory Visit: Payer: Medicare Other | Admitting: Internal Medicine

## 2019-09-17 DIAGNOSIS — R0602 Shortness of breath: Secondary | ICD-10-CM | POA: Diagnosis not present

## 2019-09-17 DIAGNOSIS — I5032 Chronic diastolic (congestive) heart failure: Secondary | ICD-10-CM | POA: Diagnosis not present

## 2019-09-17 DIAGNOSIS — Z853 Personal history of malignant neoplasm of breast: Secondary | ICD-10-CM | POA: Diagnosis not present

## 2019-09-17 DIAGNOSIS — I110 Hypertensive heart disease with heart failure: Secondary | ICD-10-CM | POA: Diagnosis not present

## 2019-09-17 DIAGNOSIS — Z7901 Long term (current) use of anticoagulants: Secondary | ICD-10-CM | POA: Diagnosis not present

## 2019-09-17 DIAGNOSIS — R238 Other skin changes: Secondary | ICD-10-CM | POA: Diagnosis not present

## 2019-09-17 DIAGNOSIS — Z9981 Dependence on supplemental oxygen: Secondary | ICD-10-CM | POA: Diagnosis not present

## 2019-09-17 DIAGNOSIS — I872 Venous insufficiency (chronic) (peripheral): Secondary | ICD-10-CM | POA: Diagnosis not present

## 2019-09-17 DIAGNOSIS — J449 Chronic obstructive pulmonary disease, unspecified: Secondary | ICD-10-CM | POA: Diagnosis not present

## 2019-09-17 DIAGNOSIS — K219 Gastro-esophageal reflux disease without esophagitis: Secondary | ICD-10-CM | POA: Diagnosis not present

## 2019-09-17 DIAGNOSIS — Z9181 History of falling: Secondary | ICD-10-CM | POA: Diagnosis not present

## 2019-09-17 DIAGNOSIS — Z9011 Acquired absence of right breast and nipple: Secondary | ICD-10-CM | POA: Diagnosis not present

## 2019-09-17 DIAGNOSIS — Z993 Dependence on wheelchair: Secondary | ICD-10-CM | POA: Diagnosis not present

## 2019-09-17 DIAGNOSIS — H409 Unspecified glaucoma: Secondary | ICD-10-CM | POA: Diagnosis not present

## 2019-09-17 DIAGNOSIS — Z87440 Personal history of urinary (tract) infections: Secondary | ICD-10-CM | POA: Diagnosis not present

## 2019-09-17 DIAGNOSIS — I480 Paroxysmal atrial fibrillation: Secondary | ICD-10-CM | POA: Diagnosis not present

## 2019-09-17 DIAGNOSIS — E559 Vitamin D deficiency, unspecified: Secondary | ICD-10-CM | POA: Diagnosis not present

## 2019-09-17 DIAGNOSIS — J984 Other disorders of lung: Secondary | ICD-10-CM | POA: Diagnosis not present

## 2019-09-17 DIAGNOSIS — Z96653 Presence of artificial knee joint, bilateral: Secondary | ICD-10-CM | POA: Diagnosis not present

## 2019-09-17 DIAGNOSIS — Z7982 Long term (current) use of aspirin: Secondary | ICD-10-CM | POA: Diagnosis not present

## 2019-09-18 DIAGNOSIS — I5032 Chronic diastolic (congestive) heart failure: Secondary | ICD-10-CM | POA: Diagnosis not present

## 2019-09-18 DIAGNOSIS — R238 Other skin changes: Secondary | ICD-10-CM | POA: Diagnosis not present

## 2019-09-18 DIAGNOSIS — E559 Vitamin D deficiency, unspecified: Secondary | ICD-10-CM | POA: Diagnosis not present

## 2019-09-18 DIAGNOSIS — I110 Hypertensive heart disease with heart failure: Secondary | ICD-10-CM | POA: Diagnosis not present

## 2019-09-18 DIAGNOSIS — Z7901 Long term (current) use of anticoagulants: Secondary | ICD-10-CM | POA: Diagnosis not present

## 2019-09-18 DIAGNOSIS — Z993 Dependence on wheelchair: Secondary | ICD-10-CM | POA: Diagnosis not present

## 2019-09-18 DIAGNOSIS — I480 Paroxysmal atrial fibrillation: Secondary | ICD-10-CM | POA: Diagnosis not present

## 2019-09-18 DIAGNOSIS — Z96653 Presence of artificial knee joint, bilateral: Secondary | ICD-10-CM | POA: Diagnosis not present

## 2019-09-18 DIAGNOSIS — Z87440 Personal history of urinary (tract) infections: Secondary | ICD-10-CM | POA: Diagnosis not present

## 2019-09-18 DIAGNOSIS — J449 Chronic obstructive pulmonary disease, unspecified: Secondary | ICD-10-CM | POA: Diagnosis not present

## 2019-09-18 DIAGNOSIS — Z853 Personal history of malignant neoplasm of breast: Secondary | ICD-10-CM | POA: Diagnosis not present

## 2019-09-18 DIAGNOSIS — Z9011 Acquired absence of right breast and nipple: Secondary | ICD-10-CM | POA: Diagnosis not present

## 2019-09-18 DIAGNOSIS — Z9181 History of falling: Secondary | ICD-10-CM | POA: Diagnosis not present

## 2019-09-18 DIAGNOSIS — Z7982 Long term (current) use of aspirin: Secondary | ICD-10-CM | POA: Diagnosis not present

## 2019-09-18 DIAGNOSIS — Z9981 Dependence on supplemental oxygen: Secondary | ICD-10-CM | POA: Diagnosis not present

## 2019-09-18 DIAGNOSIS — H409 Unspecified glaucoma: Secondary | ICD-10-CM | POA: Diagnosis not present

## 2019-09-18 DIAGNOSIS — I872 Venous insufficiency (chronic) (peripheral): Secondary | ICD-10-CM | POA: Diagnosis not present

## 2019-09-18 DIAGNOSIS — K219 Gastro-esophageal reflux disease without esophagitis: Secondary | ICD-10-CM | POA: Diagnosis not present

## 2019-09-19 DIAGNOSIS — Z9011 Acquired absence of right breast and nipple: Secondary | ICD-10-CM | POA: Diagnosis not present

## 2019-09-19 DIAGNOSIS — H409 Unspecified glaucoma: Secondary | ICD-10-CM | POA: Diagnosis not present

## 2019-09-19 DIAGNOSIS — I872 Venous insufficiency (chronic) (peripheral): Secondary | ICD-10-CM | POA: Diagnosis not present

## 2019-09-19 DIAGNOSIS — I480 Paroxysmal atrial fibrillation: Secondary | ICD-10-CM | POA: Diagnosis not present

## 2019-09-19 DIAGNOSIS — Z7901 Long term (current) use of anticoagulants: Secondary | ICD-10-CM | POA: Diagnosis not present

## 2019-09-19 DIAGNOSIS — Z96653 Presence of artificial knee joint, bilateral: Secondary | ICD-10-CM | POA: Diagnosis not present

## 2019-09-19 DIAGNOSIS — Z993 Dependence on wheelchair: Secondary | ICD-10-CM | POA: Diagnosis not present

## 2019-09-19 DIAGNOSIS — R238 Other skin changes: Secondary | ICD-10-CM | POA: Diagnosis not present

## 2019-09-19 DIAGNOSIS — Z7982 Long term (current) use of aspirin: Secondary | ICD-10-CM | POA: Diagnosis not present

## 2019-09-19 DIAGNOSIS — Z853 Personal history of malignant neoplasm of breast: Secondary | ICD-10-CM | POA: Diagnosis not present

## 2019-09-19 DIAGNOSIS — Z9981 Dependence on supplemental oxygen: Secondary | ICD-10-CM | POA: Diagnosis not present

## 2019-09-19 DIAGNOSIS — I110 Hypertensive heart disease with heart failure: Secondary | ICD-10-CM | POA: Diagnosis not present

## 2019-09-19 DIAGNOSIS — E559 Vitamin D deficiency, unspecified: Secondary | ICD-10-CM | POA: Diagnosis not present

## 2019-09-19 DIAGNOSIS — Z9181 History of falling: Secondary | ICD-10-CM | POA: Diagnosis not present

## 2019-09-19 DIAGNOSIS — J449 Chronic obstructive pulmonary disease, unspecified: Secondary | ICD-10-CM | POA: Diagnosis not present

## 2019-09-19 DIAGNOSIS — I5032 Chronic diastolic (congestive) heart failure: Secondary | ICD-10-CM | POA: Diagnosis not present

## 2019-09-19 DIAGNOSIS — Z87440 Personal history of urinary (tract) infections: Secondary | ICD-10-CM | POA: Diagnosis not present

## 2019-09-19 DIAGNOSIS — K219 Gastro-esophageal reflux disease without esophagitis: Secondary | ICD-10-CM | POA: Diagnosis not present

## 2019-09-20 DIAGNOSIS — K219 Gastro-esophageal reflux disease without esophagitis: Secondary | ICD-10-CM | POA: Diagnosis not present

## 2019-09-20 DIAGNOSIS — H409 Unspecified glaucoma: Secondary | ICD-10-CM | POA: Diagnosis not present

## 2019-09-20 DIAGNOSIS — I480 Paroxysmal atrial fibrillation: Secondary | ICD-10-CM | POA: Diagnosis not present

## 2019-09-20 DIAGNOSIS — E559 Vitamin D deficiency, unspecified: Secondary | ICD-10-CM | POA: Diagnosis not present

## 2019-09-20 DIAGNOSIS — Z853 Personal history of malignant neoplasm of breast: Secondary | ICD-10-CM | POA: Diagnosis not present

## 2019-09-20 DIAGNOSIS — Z87440 Personal history of urinary (tract) infections: Secondary | ICD-10-CM | POA: Diagnosis not present

## 2019-09-20 DIAGNOSIS — I872 Venous insufficiency (chronic) (peripheral): Secondary | ICD-10-CM | POA: Diagnosis not present

## 2019-09-20 DIAGNOSIS — Z9181 History of falling: Secondary | ICD-10-CM | POA: Diagnosis not present

## 2019-09-20 DIAGNOSIS — J449 Chronic obstructive pulmonary disease, unspecified: Secondary | ICD-10-CM | POA: Diagnosis not present

## 2019-09-20 DIAGNOSIS — I110 Hypertensive heart disease with heart failure: Secondary | ICD-10-CM | POA: Diagnosis not present

## 2019-09-20 DIAGNOSIS — Z9011 Acquired absence of right breast and nipple: Secondary | ICD-10-CM | POA: Diagnosis not present

## 2019-09-20 DIAGNOSIS — Z9981 Dependence on supplemental oxygen: Secondary | ICD-10-CM | POA: Diagnosis not present

## 2019-09-20 DIAGNOSIS — I5032 Chronic diastolic (congestive) heart failure: Secondary | ICD-10-CM | POA: Diagnosis not present

## 2019-09-20 DIAGNOSIS — Z7901 Long term (current) use of anticoagulants: Secondary | ICD-10-CM | POA: Diagnosis not present

## 2019-09-20 DIAGNOSIS — Z7982 Long term (current) use of aspirin: Secondary | ICD-10-CM | POA: Diagnosis not present

## 2019-09-20 DIAGNOSIS — Z96653 Presence of artificial knee joint, bilateral: Secondary | ICD-10-CM | POA: Diagnosis not present

## 2019-09-20 DIAGNOSIS — Z993 Dependence on wheelchair: Secondary | ICD-10-CM | POA: Diagnosis not present

## 2019-09-20 DIAGNOSIS — R238 Other skin changes: Secondary | ICD-10-CM | POA: Diagnosis not present

## 2019-09-23 DIAGNOSIS — J449 Chronic obstructive pulmonary disease, unspecified: Secondary | ICD-10-CM | POA: Diagnosis not present

## 2019-09-23 DIAGNOSIS — Z7901 Long term (current) use of anticoagulants: Secondary | ICD-10-CM | POA: Diagnosis not present

## 2019-09-23 DIAGNOSIS — I480 Paroxysmal atrial fibrillation: Secondary | ICD-10-CM | POA: Diagnosis not present

## 2019-09-23 DIAGNOSIS — Z87440 Personal history of urinary (tract) infections: Secondary | ICD-10-CM | POA: Diagnosis not present

## 2019-09-23 DIAGNOSIS — H409 Unspecified glaucoma: Secondary | ICD-10-CM | POA: Diagnosis not present

## 2019-09-23 DIAGNOSIS — Z9011 Acquired absence of right breast and nipple: Secondary | ICD-10-CM | POA: Diagnosis not present

## 2019-09-23 DIAGNOSIS — R238 Other skin changes: Secondary | ICD-10-CM | POA: Diagnosis not present

## 2019-09-23 DIAGNOSIS — Z9181 History of falling: Secondary | ICD-10-CM | POA: Diagnosis not present

## 2019-09-23 DIAGNOSIS — Z96653 Presence of artificial knee joint, bilateral: Secondary | ICD-10-CM | POA: Diagnosis not present

## 2019-09-23 DIAGNOSIS — Z9981 Dependence on supplemental oxygen: Secondary | ICD-10-CM | POA: Diagnosis not present

## 2019-09-23 DIAGNOSIS — Z993 Dependence on wheelchair: Secondary | ICD-10-CM | POA: Diagnosis not present

## 2019-09-23 DIAGNOSIS — Z7982 Long term (current) use of aspirin: Secondary | ICD-10-CM | POA: Diagnosis not present

## 2019-09-23 DIAGNOSIS — Z853 Personal history of malignant neoplasm of breast: Secondary | ICD-10-CM | POA: Diagnosis not present

## 2019-09-23 DIAGNOSIS — I110 Hypertensive heart disease with heart failure: Secondary | ICD-10-CM | POA: Diagnosis not present

## 2019-09-23 DIAGNOSIS — E559 Vitamin D deficiency, unspecified: Secondary | ICD-10-CM | POA: Diagnosis not present

## 2019-09-23 DIAGNOSIS — I872 Venous insufficiency (chronic) (peripheral): Secondary | ICD-10-CM | POA: Diagnosis not present

## 2019-09-23 DIAGNOSIS — I5032 Chronic diastolic (congestive) heart failure: Secondary | ICD-10-CM | POA: Diagnosis not present

## 2019-09-23 DIAGNOSIS — K219 Gastro-esophageal reflux disease without esophagitis: Secondary | ICD-10-CM | POA: Diagnosis not present

## 2019-09-24 DIAGNOSIS — E559 Vitamin D deficiency, unspecified: Secondary | ICD-10-CM | POA: Diagnosis not present

## 2019-09-24 DIAGNOSIS — I480 Paroxysmal atrial fibrillation: Secondary | ICD-10-CM | POA: Diagnosis not present

## 2019-09-24 DIAGNOSIS — K219 Gastro-esophageal reflux disease without esophagitis: Secondary | ICD-10-CM | POA: Diagnosis not present

## 2019-09-24 DIAGNOSIS — Z7982 Long term (current) use of aspirin: Secondary | ICD-10-CM | POA: Diagnosis not present

## 2019-09-24 DIAGNOSIS — I110 Hypertensive heart disease with heart failure: Secondary | ICD-10-CM | POA: Diagnosis not present

## 2019-09-24 DIAGNOSIS — J158 Pneumonia due to other specified bacteria: Secondary | ICD-10-CM | POA: Diagnosis not present

## 2019-09-24 DIAGNOSIS — Z853 Personal history of malignant neoplasm of breast: Secondary | ICD-10-CM | POA: Diagnosis not present

## 2019-09-24 DIAGNOSIS — J449 Chronic obstructive pulmonary disease, unspecified: Secondary | ICD-10-CM | POA: Diagnosis not present

## 2019-09-24 DIAGNOSIS — H409 Unspecified glaucoma: Secondary | ICD-10-CM | POA: Diagnosis not present

## 2019-09-24 DIAGNOSIS — I872 Venous insufficiency (chronic) (peripheral): Secondary | ICD-10-CM | POA: Diagnosis not present

## 2019-09-24 DIAGNOSIS — R238 Other skin changes: Secondary | ICD-10-CM | POA: Diagnosis not present

## 2019-09-24 DIAGNOSIS — Z9181 History of falling: Secondary | ICD-10-CM | POA: Diagnosis not present

## 2019-09-24 DIAGNOSIS — Z87440 Personal history of urinary (tract) infections: Secondary | ICD-10-CM | POA: Diagnosis not present

## 2019-09-24 DIAGNOSIS — Z96653 Presence of artificial knee joint, bilateral: Secondary | ICD-10-CM | POA: Diagnosis not present

## 2019-09-24 DIAGNOSIS — Z9981 Dependence on supplemental oxygen: Secondary | ICD-10-CM | POA: Diagnosis not present

## 2019-09-24 DIAGNOSIS — Z9011 Acquired absence of right breast and nipple: Secondary | ICD-10-CM | POA: Diagnosis not present

## 2019-09-24 DIAGNOSIS — I5032 Chronic diastolic (congestive) heart failure: Secondary | ICD-10-CM | POA: Diagnosis not present

## 2019-09-24 DIAGNOSIS — Z7901 Long term (current) use of anticoagulants: Secondary | ICD-10-CM | POA: Diagnosis not present

## 2019-09-24 DIAGNOSIS — Z993 Dependence on wheelchair: Secondary | ICD-10-CM | POA: Diagnosis not present

## 2019-09-25 DIAGNOSIS — I872 Venous insufficiency (chronic) (peripheral): Secondary | ICD-10-CM | POA: Diagnosis not present

## 2019-09-25 DIAGNOSIS — Z7901 Long term (current) use of anticoagulants: Secondary | ICD-10-CM | POA: Diagnosis not present

## 2019-09-25 DIAGNOSIS — Z9181 History of falling: Secondary | ICD-10-CM | POA: Diagnosis not present

## 2019-09-25 DIAGNOSIS — Z993 Dependence on wheelchair: Secondary | ICD-10-CM | POA: Diagnosis not present

## 2019-09-25 DIAGNOSIS — I5032 Chronic diastolic (congestive) heart failure: Secondary | ICD-10-CM | POA: Diagnosis not present

## 2019-09-25 DIAGNOSIS — K219 Gastro-esophageal reflux disease without esophagitis: Secondary | ICD-10-CM | POA: Diagnosis not present

## 2019-09-25 DIAGNOSIS — Z9011 Acquired absence of right breast and nipple: Secondary | ICD-10-CM | POA: Diagnosis not present

## 2019-09-25 DIAGNOSIS — Z853 Personal history of malignant neoplasm of breast: Secondary | ICD-10-CM | POA: Diagnosis not present

## 2019-09-25 DIAGNOSIS — J449 Chronic obstructive pulmonary disease, unspecified: Secondary | ICD-10-CM | POA: Diagnosis not present

## 2019-09-25 DIAGNOSIS — Z9981 Dependence on supplemental oxygen: Secondary | ICD-10-CM | POA: Diagnosis not present

## 2019-09-25 DIAGNOSIS — R238 Other skin changes: Secondary | ICD-10-CM | POA: Diagnosis not present

## 2019-09-25 DIAGNOSIS — Z96653 Presence of artificial knee joint, bilateral: Secondary | ICD-10-CM | POA: Diagnosis not present

## 2019-09-25 DIAGNOSIS — E559 Vitamin D deficiency, unspecified: Secondary | ICD-10-CM | POA: Diagnosis not present

## 2019-09-25 DIAGNOSIS — I110 Hypertensive heart disease with heart failure: Secondary | ICD-10-CM | POA: Diagnosis not present

## 2019-09-25 DIAGNOSIS — Z87440 Personal history of urinary (tract) infections: Secondary | ICD-10-CM | POA: Diagnosis not present

## 2019-09-25 DIAGNOSIS — H409 Unspecified glaucoma: Secondary | ICD-10-CM | POA: Diagnosis not present

## 2019-09-25 DIAGNOSIS — Z7982 Long term (current) use of aspirin: Secondary | ICD-10-CM | POA: Diagnosis not present

## 2019-09-25 DIAGNOSIS — I480 Paroxysmal atrial fibrillation: Secondary | ICD-10-CM | POA: Diagnosis not present

## 2019-09-26 DIAGNOSIS — Z993 Dependence on wheelchair: Secondary | ICD-10-CM | POA: Diagnosis not present

## 2019-09-26 DIAGNOSIS — Z7982 Long term (current) use of aspirin: Secondary | ICD-10-CM | POA: Diagnosis not present

## 2019-09-26 DIAGNOSIS — K219 Gastro-esophageal reflux disease without esophagitis: Secondary | ICD-10-CM | POA: Diagnosis not present

## 2019-09-26 DIAGNOSIS — I480 Paroxysmal atrial fibrillation: Secondary | ICD-10-CM | POA: Diagnosis not present

## 2019-09-26 DIAGNOSIS — H409 Unspecified glaucoma: Secondary | ICD-10-CM | POA: Diagnosis not present

## 2019-09-26 DIAGNOSIS — Z9181 History of falling: Secondary | ICD-10-CM | POA: Diagnosis not present

## 2019-09-26 DIAGNOSIS — Z7901 Long term (current) use of anticoagulants: Secondary | ICD-10-CM | POA: Diagnosis not present

## 2019-09-26 DIAGNOSIS — I872 Venous insufficiency (chronic) (peripheral): Secondary | ICD-10-CM | POA: Diagnosis not present

## 2019-09-26 DIAGNOSIS — Z9981 Dependence on supplemental oxygen: Secondary | ICD-10-CM | POA: Diagnosis not present

## 2019-09-26 DIAGNOSIS — E559 Vitamin D deficiency, unspecified: Secondary | ICD-10-CM | POA: Diagnosis not present

## 2019-09-26 DIAGNOSIS — I5032 Chronic diastolic (congestive) heart failure: Secondary | ICD-10-CM | POA: Diagnosis not present

## 2019-09-26 DIAGNOSIS — J449 Chronic obstructive pulmonary disease, unspecified: Secondary | ICD-10-CM | POA: Diagnosis not present

## 2019-09-26 DIAGNOSIS — Z96653 Presence of artificial knee joint, bilateral: Secondary | ICD-10-CM | POA: Diagnosis not present

## 2019-09-26 DIAGNOSIS — R238 Other skin changes: Secondary | ICD-10-CM | POA: Diagnosis not present

## 2019-09-26 DIAGNOSIS — Z87440 Personal history of urinary (tract) infections: Secondary | ICD-10-CM | POA: Diagnosis not present

## 2019-09-26 DIAGNOSIS — Z9011 Acquired absence of right breast and nipple: Secondary | ICD-10-CM | POA: Diagnosis not present

## 2019-09-26 DIAGNOSIS — I110 Hypertensive heart disease with heart failure: Secondary | ICD-10-CM | POA: Diagnosis not present

## 2019-09-26 DIAGNOSIS — Z853 Personal history of malignant neoplasm of breast: Secondary | ICD-10-CM | POA: Diagnosis not present

## 2019-09-27 DIAGNOSIS — S91301A Unspecified open wound, right foot, initial encounter: Secondary | ICD-10-CM | POA: Diagnosis not present

## 2019-09-27 DIAGNOSIS — S91302A Unspecified open wound, left foot, initial encounter: Secondary | ICD-10-CM | POA: Diagnosis not present

## 2019-09-30 DIAGNOSIS — Z853 Personal history of malignant neoplasm of breast: Secondary | ICD-10-CM | POA: Diagnosis not present

## 2019-09-30 DIAGNOSIS — I480 Paroxysmal atrial fibrillation: Secondary | ICD-10-CM | POA: Diagnosis not present

## 2019-09-30 DIAGNOSIS — Z9181 History of falling: Secondary | ICD-10-CM | POA: Diagnosis not present

## 2019-09-30 DIAGNOSIS — Z993 Dependence on wheelchair: Secondary | ICD-10-CM | POA: Diagnosis not present

## 2019-09-30 DIAGNOSIS — H409 Unspecified glaucoma: Secondary | ICD-10-CM | POA: Diagnosis not present

## 2019-09-30 DIAGNOSIS — Z7982 Long term (current) use of aspirin: Secondary | ICD-10-CM | POA: Diagnosis not present

## 2019-09-30 DIAGNOSIS — Z9011 Acquired absence of right breast and nipple: Secondary | ICD-10-CM | POA: Diagnosis not present

## 2019-09-30 DIAGNOSIS — Z87440 Personal history of urinary (tract) infections: Secondary | ICD-10-CM | POA: Diagnosis not present

## 2019-09-30 DIAGNOSIS — R238 Other skin changes: Secondary | ICD-10-CM | POA: Diagnosis not present

## 2019-09-30 DIAGNOSIS — Z96653 Presence of artificial knee joint, bilateral: Secondary | ICD-10-CM | POA: Diagnosis not present

## 2019-09-30 DIAGNOSIS — I5032 Chronic diastolic (congestive) heart failure: Secondary | ICD-10-CM | POA: Diagnosis not present

## 2019-09-30 DIAGNOSIS — J449 Chronic obstructive pulmonary disease, unspecified: Secondary | ICD-10-CM | POA: Diagnosis not present

## 2019-09-30 DIAGNOSIS — I110 Hypertensive heart disease with heart failure: Secondary | ICD-10-CM | POA: Diagnosis not present

## 2019-09-30 DIAGNOSIS — E559 Vitamin D deficiency, unspecified: Secondary | ICD-10-CM | POA: Diagnosis not present

## 2019-09-30 DIAGNOSIS — Z7901 Long term (current) use of anticoagulants: Secondary | ICD-10-CM | POA: Diagnosis not present

## 2019-09-30 DIAGNOSIS — K219 Gastro-esophageal reflux disease without esophagitis: Secondary | ICD-10-CM | POA: Diagnosis not present

## 2019-09-30 DIAGNOSIS — Z9981 Dependence on supplemental oxygen: Secondary | ICD-10-CM | POA: Diagnosis not present

## 2019-09-30 DIAGNOSIS — I872 Venous insufficiency (chronic) (peripheral): Secondary | ICD-10-CM | POA: Diagnosis not present

## 2019-10-01 DIAGNOSIS — Z9981 Dependence on supplemental oxygen: Secondary | ICD-10-CM | POA: Diagnosis not present

## 2019-10-01 DIAGNOSIS — I110 Hypertensive heart disease with heart failure: Secondary | ICD-10-CM | POA: Diagnosis not present

## 2019-10-01 DIAGNOSIS — Z87440 Personal history of urinary (tract) infections: Secondary | ICD-10-CM | POA: Diagnosis not present

## 2019-10-01 DIAGNOSIS — Z7901 Long term (current) use of anticoagulants: Secondary | ICD-10-CM | POA: Diagnosis not present

## 2019-10-01 DIAGNOSIS — E559 Vitamin D deficiency, unspecified: Secondary | ICD-10-CM | POA: Diagnosis not present

## 2019-10-01 DIAGNOSIS — K219 Gastro-esophageal reflux disease without esophagitis: Secondary | ICD-10-CM | POA: Diagnosis not present

## 2019-10-01 DIAGNOSIS — Z853 Personal history of malignant neoplasm of breast: Secondary | ICD-10-CM | POA: Diagnosis not present

## 2019-10-01 DIAGNOSIS — Z9011 Acquired absence of right breast and nipple: Secondary | ICD-10-CM | POA: Diagnosis not present

## 2019-10-01 DIAGNOSIS — J449 Chronic obstructive pulmonary disease, unspecified: Secondary | ICD-10-CM | POA: Diagnosis not present

## 2019-10-01 DIAGNOSIS — Z96653 Presence of artificial knee joint, bilateral: Secondary | ICD-10-CM | POA: Diagnosis not present

## 2019-10-01 DIAGNOSIS — H409 Unspecified glaucoma: Secondary | ICD-10-CM | POA: Diagnosis not present

## 2019-10-01 DIAGNOSIS — Z9181 History of falling: Secondary | ICD-10-CM | POA: Diagnosis not present

## 2019-10-01 DIAGNOSIS — Z993 Dependence on wheelchair: Secondary | ICD-10-CM | POA: Diagnosis not present

## 2019-10-01 DIAGNOSIS — Z7982 Long term (current) use of aspirin: Secondary | ICD-10-CM | POA: Diagnosis not present

## 2019-10-01 DIAGNOSIS — I480 Paroxysmal atrial fibrillation: Secondary | ICD-10-CM | POA: Diagnosis not present

## 2019-10-01 DIAGNOSIS — I5032 Chronic diastolic (congestive) heart failure: Secondary | ICD-10-CM | POA: Diagnosis not present

## 2019-10-01 DIAGNOSIS — I872 Venous insufficiency (chronic) (peripheral): Secondary | ICD-10-CM | POA: Diagnosis not present

## 2019-10-01 DIAGNOSIS — R238 Other skin changes: Secondary | ICD-10-CM | POA: Diagnosis not present

## 2019-10-03 DIAGNOSIS — Z9181 History of falling: Secondary | ICD-10-CM | POA: Diagnosis not present

## 2019-10-03 DIAGNOSIS — I872 Venous insufficiency (chronic) (peripheral): Secondary | ICD-10-CM | POA: Diagnosis not present

## 2019-10-03 DIAGNOSIS — I480 Paroxysmal atrial fibrillation: Secondary | ICD-10-CM | POA: Diagnosis not present

## 2019-10-03 DIAGNOSIS — Z9011 Acquired absence of right breast and nipple: Secondary | ICD-10-CM | POA: Diagnosis not present

## 2019-10-03 DIAGNOSIS — Z87440 Personal history of urinary (tract) infections: Secondary | ICD-10-CM | POA: Diagnosis not present

## 2019-10-03 DIAGNOSIS — E559 Vitamin D deficiency, unspecified: Secondary | ICD-10-CM | POA: Diagnosis not present

## 2019-10-03 DIAGNOSIS — H409 Unspecified glaucoma: Secondary | ICD-10-CM | POA: Diagnosis not present

## 2019-10-03 DIAGNOSIS — Z96653 Presence of artificial knee joint, bilateral: Secondary | ICD-10-CM | POA: Diagnosis not present

## 2019-10-03 DIAGNOSIS — Z9981 Dependence on supplemental oxygen: Secondary | ICD-10-CM | POA: Diagnosis not present

## 2019-10-03 DIAGNOSIS — R238 Other skin changes: Secondary | ICD-10-CM | POA: Diagnosis not present

## 2019-10-03 DIAGNOSIS — J449 Chronic obstructive pulmonary disease, unspecified: Secondary | ICD-10-CM | POA: Diagnosis not present

## 2019-10-03 DIAGNOSIS — K219 Gastro-esophageal reflux disease without esophagitis: Secondary | ICD-10-CM | POA: Diagnosis not present

## 2019-10-03 DIAGNOSIS — Z7982 Long term (current) use of aspirin: Secondary | ICD-10-CM | POA: Diagnosis not present

## 2019-10-03 DIAGNOSIS — Z993 Dependence on wheelchair: Secondary | ICD-10-CM | POA: Diagnosis not present

## 2019-10-03 DIAGNOSIS — I110 Hypertensive heart disease with heart failure: Secondary | ICD-10-CM | POA: Diagnosis not present

## 2019-10-03 DIAGNOSIS — I5032 Chronic diastolic (congestive) heart failure: Secondary | ICD-10-CM | POA: Diagnosis not present

## 2019-10-03 DIAGNOSIS — Z7901 Long term (current) use of anticoagulants: Secondary | ICD-10-CM | POA: Diagnosis not present

## 2019-10-03 DIAGNOSIS — Z853 Personal history of malignant neoplasm of breast: Secondary | ICD-10-CM | POA: Diagnosis not present

## 2019-10-07 DIAGNOSIS — Z9181 History of falling: Secondary | ICD-10-CM | POA: Diagnosis not present

## 2019-10-07 DIAGNOSIS — Z9011 Acquired absence of right breast and nipple: Secondary | ICD-10-CM | POA: Diagnosis not present

## 2019-10-07 DIAGNOSIS — I480 Paroxysmal atrial fibrillation: Secondary | ICD-10-CM | POA: Diagnosis not present

## 2019-10-07 DIAGNOSIS — Z9981 Dependence on supplemental oxygen: Secondary | ICD-10-CM | POA: Diagnosis not present

## 2019-10-07 DIAGNOSIS — I872 Venous insufficiency (chronic) (peripheral): Secondary | ICD-10-CM | POA: Diagnosis not present

## 2019-10-07 DIAGNOSIS — Z96653 Presence of artificial knee joint, bilateral: Secondary | ICD-10-CM | POA: Diagnosis not present

## 2019-10-07 DIAGNOSIS — J449 Chronic obstructive pulmonary disease, unspecified: Secondary | ICD-10-CM | POA: Diagnosis not present

## 2019-10-07 DIAGNOSIS — Z7901 Long term (current) use of anticoagulants: Secondary | ICD-10-CM | POA: Diagnosis not present

## 2019-10-07 DIAGNOSIS — I110 Hypertensive heart disease with heart failure: Secondary | ICD-10-CM | POA: Diagnosis not present

## 2019-10-07 DIAGNOSIS — K219 Gastro-esophageal reflux disease without esophagitis: Secondary | ICD-10-CM | POA: Diagnosis not present

## 2019-10-07 DIAGNOSIS — E559 Vitamin D deficiency, unspecified: Secondary | ICD-10-CM | POA: Diagnosis not present

## 2019-10-07 DIAGNOSIS — Z7982 Long term (current) use of aspirin: Secondary | ICD-10-CM | POA: Diagnosis not present

## 2019-10-07 DIAGNOSIS — H409 Unspecified glaucoma: Secondary | ICD-10-CM | POA: Diagnosis not present

## 2019-10-07 DIAGNOSIS — Z853 Personal history of malignant neoplasm of breast: Secondary | ICD-10-CM | POA: Diagnosis not present

## 2019-10-07 DIAGNOSIS — Z87440 Personal history of urinary (tract) infections: Secondary | ICD-10-CM | POA: Diagnosis not present

## 2019-10-07 DIAGNOSIS — Z993 Dependence on wheelchair: Secondary | ICD-10-CM | POA: Diagnosis not present

## 2019-10-07 DIAGNOSIS — R238 Other skin changes: Secondary | ICD-10-CM | POA: Diagnosis not present

## 2019-10-07 DIAGNOSIS — I5032 Chronic diastolic (congestive) heart failure: Secondary | ICD-10-CM | POA: Diagnosis not present

## 2019-10-07 NOTE — Progress Notes (Deleted)
HPI:  FU atrial fibrillation. Carotid Dopplers July 2018 showed 1 to 39% bilateral stenosis. Echocardiogram November 2020 performed at outside facility showed normal LV function, grade 2 diastolic dysfunction, severe left atrial enlargement, moderate mitral regurgitation, mild mitral stenosis, moderate tricuspid regurgitation.  CTA November 2020 showed no pulmonary embolus, bilateral pleural effusions with patchy groundglass infiltrates/edema.  Patient recently seen at [facility] due to not eating.  Noted to be in atrial fibrillation with rapid ventricular response.  Laboratory showed hemoglobin 13.1, BUN 13, creatinine 0.62.  Patient was given metoprolol and diltiazem with improvement in her symptoms. Given overall frail condition at last office visit we felt rate control and anticoagulation likely appropriate.  Monitor March 2021 showed atrial fibrillation with PVCs or aberrantly conducted beats rate elevated.  Metoprolol was increased.  Patient has been seen at [facility] recently after fall including scalp laceration.  Since last seen  Current Outpatient Medications  Medication Sig Dispense Refill  . apixaban (ELIQUIS) 2.5 MG TABS tablet Take 1 tablet (2.5 mg total) by mouth 2 (two) times daily. 60 tablet 12  . azelastine (ASTELIN) 137 MCG/SPRAY nasal spray Place 1 spray into the nose 2 (two) times daily as needed (for allergies). Reported on 07/20/2015    . Biotin (BIOTIN ULTRA STRENGTH) 10 MG CAPS Take 1 capsule by mouth daily.    . furosemide (LASIX) 20 MG tablet Take 2 tablets (40 mg total) by mouth daily. Take in the morning 180 tablet 3  . LORazepam (ATIVAN) 2 MG tablet TAKE 1/2 TABLET BY MOUTH EVERY 8 HOURS AS NEEDED FOR ANXIETY 60 tablet 0  . metoprolol tartrate (LOPRESSOR) 50 MG tablet Take 1 and 1/2 tablets twice a day (Patient taking differently: 75 mg. Take 1 tablet BID) 270 tablet 3  . mirtazapine (REMERON SOL-TAB) 15 MG disintegrating tablet TAKE 1 TABLET BY MOUTH EVERYDAY AT BEDTIME 90  tablet 0  . pantoprazole (PROTONIX) 40 MG tablet TAKE 1 TABLET BY MOUTH EVERY DAY 90 tablet 1  . potassium chloride (KLOR-CON) 20 MEQ packet Take 40 mEq by mouth daily. 60 packet 5  . potassium chloride SA (KLOR-CON M20) 20 MEQ tablet Take 2 tablets (40 mEq total) by mouth daily. 180 tablet 1  . vitamin B-12 (CYANOCOBALAMIN) 1000 MCG tablet Take 1,000 mcg by mouth daily.       No current facility-administered medications for this visit.     Past Medical History:  Diagnosis Date  . Anxiety   . Atrial fibrillation ([place])   . Breast cancer ([place]) 13 years ago   s/p tamoxifen   . COPD (chronic obstructive pulmonary disease) ([place])   . Depression   . Diverticulosis   . Esophageal stricture   . Gastric outlet obstruction   . GERD (gastroesophageal reflux disease)   . Glaucoma   . Hiatal hernia   . Hyperlipidemia   . Hypertension   . Insomnia   . Internal hemorrhoids   . Mitral regurgitation and mitral stenosis   . Osteoarthritis   . Osteoporosis   . Peptic ulcer disease   . Rheumatic heart disease   . Ulcerative colitis     Past Surgical History:  Procedure Laterality Date  . ABDOMINAL HYSTERECTOMY    . BREAST RECONSTRUCTION  2000   right  . BREAST SURGERY     right  . CATARACT EXTRACTION  08/2006 and 10/2006  . HEMORRHOID SURGERY    . HIATAL HERNIA REPAIR N/A 09/03/2012   Procedure: LAPAROSCOPIC REPAIR OF HIATAL HERNIA;  Surgeon: Pedro Earls, MD;  Location: [facility];  Service: General;  Laterality: N/A;  . JOINT REPLACEMENT     right  . LAPAROSCOPIC NISSEN FUNDOPLICATION N/A [id number]   Procedure: LAP REPAIR OF LARGE TYPE III MIXED HIATUS HERNIA WITH NISSEN FUNOPLICATION;  Surgeon: Pedro Earls, MD;  Location: [facility];  Service: General;  Laterality: N/A;  . [place]   right  . TONSILLECTOMY    . TOTAL KNEE ARTHROPLASTY     left    Social History   Socioeconomic History  . Marital status: Widowed    Spouse name: Not on file  . Number of children: 3   . Years of education: Not on file  . Highest education level: Not on file  Occupational History  . Occupation: retired-sales    Employer: RETIRED  Tobacco Use  . Smoking status: Never Smoker  . Smokeless tobacco: Never Used  Vaping Use  . Vaping Use: Never used  Substance and Sexual Activity  . Alcohol use: No    Alcohol/week: 0.0 standard drinks  . Drug use: No  . Sexual activity: Never  Other Topics Concern  . Not on file  Social History Narrative  . Not on file   Social Determinants of Health   Financial Resource Strain:   . Difficulty of Paying Living Expenses:   Food Insecurity:   . Worried About Charity fundraiser in the Last Year:   . Arboriculturist in the Last Year:   Transportation Needs:   . Film/video editor (Medical):   [name] Lack of Transportation (Non-Medical):   Physical Activity:   . Days of Exercise per Week:   . Minutes of Exercise per Session:   Stress:   . Feeling of Stress :   Social Connections:   . Frequency of Communication with Friends and Family:   . Frequency of Social Gatherings with Friends and Family:   . Attends Religious Services:   . Active Member of Clubs or Organizations:   . Attends Archivist Meetings:   [name] Marital Status:   Intimate Partner Violence:   . Fear of Current or Ex-Partner:   . Emotionally Abused:   [name] Physically Abused:   . Sexually Abused:     Family History  Problem Relation Age of Onset  . Coronary artery disease Sister        x 2 in 48's  . Coronary artery disease Brother        x 2 in 70's  . Diabetes Other   . Colon cancer Father 14  . Hypertension Other   . Stroke Mother 11  . Crohn's disease Brother   . Breast cancer Other        Aunt  . Cirrhosis Brother   . Liver disease Son     ROS: no fevers or chills, productive cough, hemoptysis, dysphasia, odynophagia, melena, hematochezia, dysuria, hematuria, rash, seizure activity, orthopnea, PND, pedal edema, claudication. Remaining  systems are negative.  Physical Exam: Well-developed well-nourished in no acute distress.  Skin is warm and dry.  HEENT is normal.  Neck is supple.  Chest is clear to auscultation with normal expansion.  Cardiovascular exam is regular rate and rhythm.  Abdominal exam nontender or distended. No masses palpated. Extremities show no edema. neuro grossly intact  ECG- personally reviewed  A/P  1 permanent atrial fibrillation-duration of patient's atrial fibrillation is unknown.  We will continue plan for rate control and anticoagulation.  Continue metoprolol  and Cardizem.  Issue of anticoagulation is difficult.  2 Mitral stenosis/mitral regurgitation-patient will need follow-up echoes in the future.  However given her age and overall medical condition She would be a good surgical candidate.  3 chronic diastolic congestive heart failure-we will continue Lasix at present dose.  Check potassium and renal function.  4 hypertension-blood pressure controlled.  Continue present medical regimen.  5 hyperlipidemia-managed by primary care.  Kirk Ruths, MD

## 2019-10-08 DIAGNOSIS — Z853 Personal history of malignant neoplasm of breast: Secondary | ICD-10-CM | POA: Diagnosis not present

## 2019-10-08 DIAGNOSIS — Z7982 Long term (current) use of aspirin: Secondary | ICD-10-CM | POA: Diagnosis not present

## 2019-10-08 DIAGNOSIS — Z9181 History of falling: Secondary | ICD-10-CM | POA: Diagnosis not present

## 2019-10-08 DIAGNOSIS — Z96653 Presence of artificial knee joint, bilateral: Secondary | ICD-10-CM | POA: Diagnosis not present

## 2019-10-08 DIAGNOSIS — Z9011 Acquired absence of right breast and nipple: Secondary | ICD-10-CM | POA: Diagnosis not present

## 2019-10-08 DIAGNOSIS — I110 Hypertensive heart disease with heart failure: Secondary | ICD-10-CM | POA: Diagnosis not present

## 2019-10-08 DIAGNOSIS — I480 Paroxysmal atrial fibrillation: Secondary | ICD-10-CM | POA: Diagnosis not present

## 2019-10-08 DIAGNOSIS — H409 Unspecified glaucoma: Secondary | ICD-10-CM | POA: Diagnosis not present

## 2019-10-08 DIAGNOSIS — I872 Venous insufficiency (chronic) (peripheral): Secondary | ICD-10-CM | POA: Diagnosis not present

## 2019-10-08 DIAGNOSIS — I5032 Chronic diastolic (congestive) heart failure: Secondary | ICD-10-CM | POA: Diagnosis not present

## 2019-10-08 DIAGNOSIS — Z9981 Dependence on supplemental oxygen: Secondary | ICD-10-CM | POA: Diagnosis not present

## 2019-10-08 DIAGNOSIS — Z87440 Personal history of urinary (tract) infections: Secondary | ICD-10-CM | POA: Diagnosis not present

## 2019-10-08 DIAGNOSIS — Z993 Dependence on wheelchair: Secondary | ICD-10-CM | POA: Diagnosis not present

## 2019-10-08 DIAGNOSIS — K219 Gastro-esophageal reflux disease without esophagitis: Secondary | ICD-10-CM | POA: Diagnosis not present

## 2019-10-08 DIAGNOSIS — J449 Chronic obstructive pulmonary disease, unspecified: Secondary | ICD-10-CM | POA: Diagnosis not present

## 2019-10-08 DIAGNOSIS — R238 Other skin changes: Secondary | ICD-10-CM | POA: Diagnosis not present

## 2019-10-08 DIAGNOSIS — Z7901 Long term (current) use of anticoagulants: Secondary | ICD-10-CM | POA: Diagnosis not present

## 2019-10-08 DIAGNOSIS — E559 Vitamin D deficiency, unspecified: Secondary | ICD-10-CM | POA: Diagnosis not present

## 2019-10-09 ENCOUNTER — Ambulatory Visit: Payer: Medicare Other | Admitting: Cardiology

## 2019-10-10 DIAGNOSIS — I5032 Chronic diastolic (congestive) heart failure: Secondary | ICD-10-CM | POA: Diagnosis not present

## 2019-10-10 DIAGNOSIS — J449 Chronic obstructive pulmonary disease, unspecified: Secondary | ICD-10-CM | POA: Diagnosis not present

## 2019-10-10 DIAGNOSIS — I872 Venous insufficiency (chronic) (peripheral): Secondary | ICD-10-CM | POA: Diagnosis not present

## 2019-10-10 DIAGNOSIS — Z96653 Presence of artificial knee joint, bilateral: Secondary | ICD-10-CM | POA: Diagnosis not present

## 2019-10-10 DIAGNOSIS — R238 Other skin changes: Secondary | ICD-10-CM | POA: Diagnosis not present

## 2019-10-10 DIAGNOSIS — Z7901 Long term (current) use of anticoagulants: Secondary | ICD-10-CM | POA: Diagnosis not present

## 2019-10-10 DIAGNOSIS — E559 Vitamin D deficiency, unspecified: Secondary | ICD-10-CM | POA: Diagnosis not present

## 2019-10-10 DIAGNOSIS — Z9011 Acquired absence of right breast and nipple: Secondary | ICD-10-CM | POA: Diagnosis not present

## 2019-10-10 DIAGNOSIS — Z7982 Long term (current) use of aspirin: Secondary | ICD-10-CM | POA: Diagnosis not present

## 2019-10-10 DIAGNOSIS — Z9981 Dependence on supplemental oxygen: Secondary | ICD-10-CM | POA: Diagnosis not present

## 2019-10-10 DIAGNOSIS — Z87440 Personal history of urinary (tract) infections: Secondary | ICD-10-CM | POA: Diagnosis not present

## 2019-10-10 DIAGNOSIS — Z853 Personal history of malignant neoplasm of breast: Secondary | ICD-10-CM | POA: Diagnosis not present

## 2019-10-10 DIAGNOSIS — K219 Gastro-esophageal reflux disease without esophagitis: Secondary | ICD-10-CM | POA: Diagnosis not present

## 2019-10-10 DIAGNOSIS — I480 Paroxysmal atrial fibrillation: Secondary | ICD-10-CM | POA: Diagnosis not present

## 2019-10-10 DIAGNOSIS — I110 Hypertensive heart disease with heart failure: Secondary | ICD-10-CM | POA: Diagnosis not present

## 2019-10-10 DIAGNOSIS — H409 Unspecified glaucoma: Secondary | ICD-10-CM | POA: Diagnosis not present

## 2019-10-10 DIAGNOSIS — Z9181 History of falling: Secondary | ICD-10-CM | POA: Diagnosis not present

## 2019-10-10 DIAGNOSIS — Z993 Dependence on wheelchair: Secondary | ICD-10-CM | POA: Diagnosis not present

## 2019-10-13 DIAGNOSIS — J449 Chronic obstructive pulmonary disease, unspecified: Secondary | ICD-10-CM | POA: Diagnosis not present

## 2019-10-14 DIAGNOSIS — J449 Chronic obstructive pulmonary disease, unspecified: Secondary | ICD-10-CM | POA: Diagnosis not present

## 2019-10-14 DIAGNOSIS — I872 Venous insufficiency (chronic) (peripheral): Secondary | ICD-10-CM | POA: Diagnosis not present

## 2019-10-14 DIAGNOSIS — Z7982 Long term (current) use of aspirin: Secondary | ICD-10-CM | POA: Diagnosis not present

## 2019-10-14 DIAGNOSIS — Z993 Dependence on wheelchair: Secondary | ICD-10-CM | POA: Diagnosis not present

## 2019-10-14 DIAGNOSIS — E559 Vitamin D deficiency, unspecified: Secondary | ICD-10-CM | POA: Diagnosis not present

## 2019-10-14 DIAGNOSIS — Z96653 Presence of artificial knee joint, bilateral: Secondary | ICD-10-CM | POA: Diagnosis not present

## 2019-10-14 DIAGNOSIS — Z9011 Acquired absence of right breast and nipple: Secondary | ICD-10-CM | POA: Diagnosis not present

## 2019-10-14 DIAGNOSIS — H409 Unspecified glaucoma: Secondary | ICD-10-CM | POA: Diagnosis not present

## 2019-10-14 DIAGNOSIS — R238 Other skin changes: Secondary | ICD-10-CM | POA: Diagnosis not present

## 2019-10-14 DIAGNOSIS — Z9181 History of falling: Secondary | ICD-10-CM | POA: Diagnosis not present

## 2019-10-14 DIAGNOSIS — K219 Gastro-esophageal reflux disease without esophagitis: Secondary | ICD-10-CM | POA: Diagnosis not present

## 2019-10-14 DIAGNOSIS — Z7901 Long term (current) use of anticoagulants: Secondary | ICD-10-CM | POA: Diagnosis not present

## 2019-10-14 DIAGNOSIS — Z853 Personal history of malignant neoplasm of breast: Secondary | ICD-10-CM | POA: Diagnosis not present

## 2019-10-14 DIAGNOSIS — I5032 Chronic diastolic (congestive) heart failure: Secondary | ICD-10-CM | POA: Diagnosis not present

## 2019-10-14 DIAGNOSIS — Z9981 Dependence on supplemental oxygen: Secondary | ICD-10-CM | POA: Diagnosis not present

## 2019-10-14 DIAGNOSIS — I110 Hypertensive heart disease with heart failure: Secondary | ICD-10-CM | POA: Diagnosis not present

## 2019-10-14 DIAGNOSIS — I480 Paroxysmal atrial fibrillation: Secondary | ICD-10-CM | POA: Diagnosis not present

## 2019-10-14 DIAGNOSIS — Z87440 Personal history of urinary (tract) infections: Secondary | ICD-10-CM | POA: Diagnosis not present

## 2019-10-15 DIAGNOSIS — I480 Paroxysmal atrial fibrillation: Secondary | ICD-10-CM | POA: Diagnosis not present

## 2019-10-15 DIAGNOSIS — I9589 Other hypotension: Secondary | ICD-10-CM | POA: Diagnosis not present

## 2019-10-17 DIAGNOSIS — H409 Unspecified glaucoma: Secondary | ICD-10-CM | POA: Diagnosis not present

## 2019-10-17 DIAGNOSIS — Z993 Dependence on wheelchair: Secondary | ICD-10-CM | POA: Diagnosis not present

## 2019-10-17 DIAGNOSIS — Z7901 Long term (current) use of anticoagulants: Secondary | ICD-10-CM | POA: Diagnosis not present

## 2019-10-17 DIAGNOSIS — Z9181 History of falling: Secondary | ICD-10-CM | POA: Diagnosis not present

## 2019-10-17 DIAGNOSIS — E559 Vitamin D deficiency, unspecified: Secondary | ICD-10-CM | POA: Diagnosis not present

## 2019-10-17 DIAGNOSIS — I110 Hypertensive heart disease with heart failure: Secondary | ICD-10-CM | POA: Diagnosis not present

## 2019-10-17 DIAGNOSIS — K219 Gastro-esophageal reflux disease without esophagitis: Secondary | ICD-10-CM | POA: Diagnosis not present

## 2019-10-17 DIAGNOSIS — D519 Vitamin B12 deficiency anemia, unspecified: Secondary | ICD-10-CM | POA: Diagnosis not present

## 2019-10-17 DIAGNOSIS — D649 Anemia, unspecified: Secondary | ICD-10-CM | POA: Diagnosis not present

## 2019-10-17 DIAGNOSIS — J449 Chronic obstructive pulmonary disease, unspecified: Secondary | ICD-10-CM | POA: Diagnosis not present

## 2019-10-17 DIAGNOSIS — E785 Hyperlipidemia, unspecified: Secondary | ICD-10-CM | POA: Diagnosis not present

## 2019-10-17 DIAGNOSIS — Z9011 Acquired absence of right breast and nipple: Secondary | ICD-10-CM | POA: Diagnosis not present

## 2019-10-17 DIAGNOSIS — R238 Other skin changes: Secondary | ICD-10-CM | POA: Diagnosis not present

## 2019-10-17 DIAGNOSIS — I872 Venous insufficiency (chronic) (peripheral): Secondary | ICD-10-CM | POA: Diagnosis not present

## 2019-10-17 DIAGNOSIS — Z96653 Presence of artificial knee joint, bilateral: Secondary | ICD-10-CM | POA: Diagnosis not present

## 2019-10-17 DIAGNOSIS — Z7982 Long term (current) use of aspirin: Secondary | ICD-10-CM | POA: Diagnosis not present

## 2019-10-17 DIAGNOSIS — I5032 Chronic diastolic (congestive) heart failure: Secondary | ICD-10-CM | POA: Diagnosis not present

## 2019-10-17 DIAGNOSIS — Z9981 Dependence on supplemental oxygen: Secondary | ICD-10-CM | POA: Diagnosis not present

## 2019-10-17 DIAGNOSIS — I480 Paroxysmal atrial fibrillation: Secondary | ICD-10-CM | POA: Diagnosis not present

## 2019-10-17 DIAGNOSIS — Z853 Personal history of malignant neoplasm of breast: Secondary | ICD-10-CM | POA: Diagnosis not present

## 2019-10-17 DIAGNOSIS — Z87440 Personal history of urinary (tract) infections: Secondary | ICD-10-CM | POA: Diagnosis not present

## 2019-10-18 DIAGNOSIS — S91302A Unspecified open wound, left foot, initial encounter: Secondary | ICD-10-CM | POA: Diagnosis not present

## 2019-10-21 DIAGNOSIS — Z9981 Dependence on supplemental oxygen: Secondary | ICD-10-CM | POA: Diagnosis not present

## 2019-10-21 DIAGNOSIS — E559 Vitamin D deficiency, unspecified: Secondary | ICD-10-CM | POA: Diagnosis not present

## 2019-10-21 DIAGNOSIS — H409 Unspecified glaucoma: Secondary | ICD-10-CM | POA: Diagnosis not present

## 2019-10-21 DIAGNOSIS — Z9181 History of falling: Secondary | ICD-10-CM | POA: Diagnosis not present

## 2019-10-21 DIAGNOSIS — I480 Paroxysmal atrial fibrillation: Secondary | ICD-10-CM | POA: Diagnosis not present

## 2019-10-21 DIAGNOSIS — Z87440 Personal history of urinary (tract) infections: Secondary | ICD-10-CM | POA: Diagnosis not present

## 2019-10-21 DIAGNOSIS — R238 Other skin changes: Secondary | ICD-10-CM | POA: Diagnosis not present

## 2019-10-21 DIAGNOSIS — J449 Chronic obstructive pulmonary disease, unspecified: Secondary | ICD-10-CM | POA: Diagnosis not present

## 2019-10-21 DIAGNOSIS — Z7901 Long term (current) use of anticoagulants: Secondary | ICD-10-CM | POA: Diagnosis not present

## 2019-10-21 DIAGNOSIS — Z9011 Acquired absence of right breast and nipple: Secondary | ICD-10-CM | POA: Diagnosis not present

## 2019-10-21 DIAGNOSIS — K219 Gastro-esophageal reflux disease without esophagitis: Secondary | ICD-10-CM | POA: Diagnosis not present

## 2019-10-21 DIAGNOSIS — Z96653 Presence of artificial knee joint, bilateral: Secondary | ICD-10-CM | POA: Diagnosis not present

## 2019-10-21 DIAGNOSIS — I110 Hypertensive heart disease with heart failure: Secondary | ICD-10-CM | POA: Diagnosis not present

## 2019-10-21 DIAGNOSIS — I5032 Chronic diastolic (congestive) heart failure: Secondary | ICD-10-CM | POA: Diagnosis not present

## 2019-10-21 DIAGNOSIS — Z853 Personal history of malignant neoplasm of breast: Secondary | ICD-10-CM | POA: Diagnosis not present

## 2019-10-21 DIAGNOSIS — Z7982 Long term (current) use of aspirin: Secondary | ICD-10-CM | POA: Diagnosis not present

## 2019-10-21 DIAGNOSIS — I872 Venous insufficiency (chronic) (peripheral): Secondary | ICD-10-CM | POA: Diagnosis not present

## 2019-10-21 DIAGNOSIS — Z993 Dependence on wheelchair: Secondary | ICD-10-CM | POA: Diagnosis not present

## 2019-10-23 DIAGNOSIS — J449 Chronic obstructive pulmonary disease, unspecified: Secondary | ICD-10-CM | POA: Diagnosis not present

## 2019-10-23 DIAGNOSIS — Z853 Personal history of malignant neoplasm of breast: Secondary | ICD-10-CM | POA: Diagnosis not present

## 2019-10-23 DIAGNOSIS — H409 Unspecified glaucoma: Secondary | ICD-10-CM | POA: Diagnosis not present

## 2019-10-23 DIAGNOSIS — Z9011 Acquired absence of right breast and nipple: Secondary | ICD-10-CM | POA: Diagnosis not present

## 2019-10-23 DIAGNOSIS — I872 Venous insufficiency (chronic) (peripheral): Secondary | ICD-10-CM | POA: Diagnosis not present

## 2019-10-23 DIAGNOSIS — K219 Gastro-esophageal reflux disease without esophagitis: Secondary | ICD-10-CM | POA: Diagnosis not present

## 2019-10-23 DIAGNOSIS — I480 Paroxysmal atrial fibrillation: Secondary | ICD-10-CM | POA: Diagnosis not present

## 2019-10-23 DIAGNOSIS — Z7982 Long term (current) use of aspirin: Secondary | ICD-10-CM | POA: Diagnosis not present

## 2019-10-23 DIAGNOSIS — Z87440 Personal history of urinary (tract) infections: Secondary | ICD-10-CM | POA: Diagnosis not present

## 2019-10-23 DIAGNOSIS — Z96653 Presence of artificial knee joint, bilateral: Secondary | ICD-10-CM | POA: Diagnosis not present

## 2019-10-23 DIAGNOSIS — I5032 Chronic diastolic (congestive) heart failure: Secondary | ICD-10-CM | POA: Diagnosis not present

## 2019-10-23 DIAGNOSIS — Z9981 Dependence on supplemental oxygen: Secondary | ICD-10-CM | POA: Diagnosis not present

## 2019-10-23 DIAGNOSIS — Z9181 History of falling: Secondary | ICD-10-CM | POA: Diagnosis not present

## 2019-10-23 DIAGNOSIS — E559 Vitamin D deficiency, unspecified: Secondary | ICD-10-CM | POA: Diagnosis not present

## 2019-10-23 DIAGNOSIS — Z7901 Long term (current) use of anticoagulants: Secondary | ICD-10-CM | POA: Diagnosis not present

## 2019-10-23 DIAGNOSIS — I110 Hypertensive heart disease with heart failure: Secondary | ICD-10-CM | POA: Diagnosis not present

## 2019-11-05 DIAGNOSIS — J984 Other disorders of lung: Secondary | ICD-10-CM | POA: Diagnosis not present

## 2019-11-05 DIAGNOSIS — I5032 Chronic diastolic (congestive) heart failure: Secondary | ICD-10-CM | POA: Diagnosis not present

## 2019-11-05 DIAGNOSIS — S22000A Wedge compression fracture of unspecified thoracic vertebra, initial encounter for closed fracture: Secondary | ICD-10-CM | POA: Diagnosis not present

## 2019-11-05 DIAGNOSIS — R64 Cachexia: Secondary | ICD-10-CM | POA: Diagnosis not present

## 2019-11-12 DIAGNOSIS — J449 Chronic obstructive pulmonary disease, unspecified: Secondary | ICD-10-CM | POA: Diagnosis not present

## 2019-11-22 NOTE — Progress Notes (Deleted)
HPI: Follow-up atrial fibrillation. Carotid Dopplers July 2018 showed 1 to 39% bilateral stenosis. Echocardiogram November 2020 performed at outside facility showed normal LV function, grade 2 diastolic dysfunction, severe left atrial enlargement, moderate mitral regurgitation, mild mitral stenosis, moderate tricuspid regurgitation.  CTA November 2020 showed no pulmonary embolus, bilateral pleural effusions with patchy groundglass infiltrates/edema.  Patient previously seen at [facility] due to not eating.  Noted to be in atrial fibrillation with rapid ventricular response.  Laboratory showed hemoglobin 13.1, BUN 13, creatinine 0.62.  Patient was given metoprolol and diltiazem with improvement in her symptoms.  Monitor March 2021 showed atrial fibrillation with PVCs or aberrantly conducted beats, rate elevated.  Metoprolol was increased.  Since last seen  Current Outpatient Medications  Medication Sig Dispense Refill  . apixaban (ELIQUIS) 2.5 MG TABS tablet Take 1 tablet (2.5 mg total) by mouth 2 (two) times daily. 60 tablet 12  . azelastine (ASTELIN) 137 MCG/SPRAY nasal spray Place 1 spray into the nose 2 (two) times daily as needed (for allergies). Reported on 07/20/2015    . Biotin (BIOTIN ULTRA STRENGTH) 10 MG CAPS Take 1 capsule by mouth daily.    . furosemide (LASIX) 20 MG tablet Take 2 tablets (40 mg total) by mouth daily. Take in the morning 180 tablet 3  . LORazepam (ATIVAN) 2 MG tablet TAKE 1/2 TABLET BY MOUTH EVERY 8 HOURS AS NEEDED FOR ANXIETY 60 tablet 0  . metoprolol tartrate (LOPRESSOR) 50 MG tablet Take 1 and 1/2 tablets twice a day (Patient taking differently: 75 mg. Take 1 tablet BID) 270 tablet 3  . mirtazapine (REMERON SOL-TAB) 15 MG disintegrating tablet TAKE 1 TABLET BY MOUTH EVERYDAY AT BEDTIME 90 tablet 0  . pantoprazole (PROTONIX) 40 MG tablet TAKE 1 TABLET BY MOUTH EVERY DAY 90 tablet 1  . potassium chloride (KLOR-CON) 20 MEQ packet Take 40 mEq by mouth daily. 60 packet 5    . potassium chloride SA (KLOR-CON M20) 20 MEQ tablet Take 2 tablets (40 mEq total) by mouth daily. 180 tablet 1  . vitamin B-12 (CYANOCOBALAMIN) 1000 MCG tablet Take 1,000 mcg by mouth daily.       No current facility-administered medications for this visit.     Past Medical History:  Diagnosis Date  . Anxiety   . Atrial fibrillation ([place])   . Breast cancer ([place]) 13 years ago   s/p tamoxifen   . COPD (chronic obstructive pulmonary disease) ([place])   . Depression   . Diverticulosis   . Esophageal stricture   . Gastric outlet obstruction   . GERD (gastroesophageal reflux disease)   . Glaucoma   . Hiatal hernia   . Hyperlipidemia   . Hypertension   . Insomnia   . Internal hemorrhoids   . Mitral regurgitation and mitral stenosis   . Osteoarthritis   . Osteoporosis   . Peptic ulcer disease   . Rheumatic heart disease   . Ulcerative colitis     Past Surgical History:  Procedure Laterality Date  . ABDOMINAL HYSTERECTOMY    . BREAST RECONSTRUCTION  2000   right  . BREAST SURGERY     right  . CATARACT EXTRACTION  08/2006 and 10/2006  . HEMORRHOID SURGERY    . HIATAL HERNIA REPAIR N/A 09/03/2012   Procedure: LAPAROSCOPIC REPAIR OF HIATAL HERNIA;  Surgeon: Pedro Earls, MD;  Location: [facility];  Service: General;  Laterality: N/A;  . JOINT REPLACEMENT     right  . LAPAROSCOPIC NISSEN FUNDOPLICATION  N/A 09/03/2012   Procedure: LAP REPAIR OF LARGE TYPE III MIXED HIATUS HERNIA WITH NISSEN FUNOPLICATION;  Surgeon: Pedro Earls, MD;  Location: [facility];  Service: General;  Laterality: N/A;  . [place]   right  . TONSILLECTOMY    . TOTAL KNEE ARTHROPLASTY     left    Social History   Socioeconomic History  . Marital status: Widowed    Spouse name: Not on file  . Number of children: 3  . Years of education: Not on file  . Highest education level: Not on file  Occupational History  . Occupation: retired-sales    Employer: RETIRED  Tobacco Use  . Smoking status:  Never Smoker  . Smokeless tobacco: Never Used  Vaping Use  . Vaping Use: Never used  Substance and Sexual Activity  . Alcohol use: No    Alcohol/week: 0.0 standard drinks  . Drug use: No  . Sexual activity: Never  Other Topics Concern  . Not on file  Social History Narrative  . Not on file   Social Determinants of Health   Financial Resource Strain:   . Difficulty of Paying Living Expenses:   Food Insecurity:   . Worried About Charity fundraiser in the Last Year:   . Arboriculturist in the Last Year:   Transportation Needs:   . Film/video editor (Medical):   [name] Lack of Transportation (Non-Medical):   Physical Activity:   . Days of Exercise per Week:   . Minutes of Exercise per Session:   Stress:   . Feeling of Stress :   Social Connections:   . Frequency of Communication with Friends and Family:   . Frequency of Social Gatherings with Friends and Family:   . Attends Religious Services:   . Active Member of Clubs or Organizations:   . Attends Archivist Meetings:   [name] Marital Status:   Intimate Partner Violence:   . Fear of Current or Ex-Partner:   . Emotionally Abused:   [name] Physically Abused:   . Sexually Abused:     Family History  Problem Relation Age of Onset  . Coronary artery disease Sister        x 2 in 98's  . Coronary artery disease Brother        x 2 in 70's  . Diabetes Other   . Colon cancer Father 75  . Hypertension Other   . Stroke Mother 22  . Crohn's disease Brother   . Breast cancer Other        Aunt  . Cirrhosis Brother   . Liver disease Son     ROS: no fevers or chills, productive cough, hemoptysis, dysphasia, odynophagia, melena, hematochezia, dysuria, hematuria, rash, seizure activity, orthopnea, PND, pedal edema, claudication. Remaining systems are negative.  Physical Exam: Well-developed well-nourished in no acute distress.  Skin is warm and dry.  HEENT is normal.  Neck is supple.  Chest is clear to auscultation with  normal expansion.  Cardiovascular exam is regular rate and rhythm.  Abdominal exam nontender or distended. No masses palpated. Extremities show no edema. neuro grossly intact  ECG- personally reviewed  A/P  1 persistent atrial fibrillation-as outlined in previous notes the duration of her atrial fibrillation is unclear.  We will continue metoprolol and Cardizem for rate control.  Patient has fallen multiple times recently.  2 mitral stenosis/mitral regurgitation-we will plan follow-up echoes in the future.  However given her age and overall  medical condition I do not think she would likely be a good candidate for surgical intervention.  3 hypertension-blood pressure controlled.  Continue present medical regimen.  4 hyperlipidemia-managed by primary care.  5 chronic diastolic congestive heart failure-plan to continue Lasix.  Check potassium and renal function.  Kirk Ruths, MD

## 2019-12-04 ENCOUNTER — Ambulatory Visit: Payer: Medicare Other | Admitting: Cardiology

## 2019-12-10 DIAGNOSIS — R64 Cachexia: Secondary | ICD-10-CM | POA: Diagnosis not present

## 2019-12-10 DIAGNOSIS — I5032 Chronic diastolic (congestive) heart failure: Secondary | ICD-10-CM | POA: Diagnosis not present

## 2019-12-10 DIAGNOSIS — J449 Chronic obstructive pulmonary disease, unspecified: Secondary | ICD-10-CM | POA: Diagnosis not present

## 2019-12-13 DIAGNOSIS — J449 Chronic obstructive pulmonary disease, unspecified: Secondary | ICD-10-CM | POA: Diagnosis not present

## 2019-12-18 DEATH — deceased
